# Patient Record
Sex: Female | Born: 1935 | Race: White | Hispanic: No | Marital: Married | Smoking: Never smoker
Health system: Southern US, Community
[De-identification: ages and names within clinical notes are randomized; demographics above are authoritative.]

## PROBLEM LIST (undated history)

## (undated) DIAGNOSIS — G629 Polyneuropathy, unspecified: Secondary | ICD-10-CM

## (undated) DIAGNOSIS — R42 Dizziness and giddiness: Secondary | ICD-10-CM

## (undated) DIAGNOSIS — R238 Other skin changes: Secondary | ICD-10-CM

## (undated) DIAGNOSIS — R0609 Other forms of dyspnea: Secondary | ICD-10-CM

## (undated) DIAGNOSIS — M549 Dorsalgia, unspecified: Secondary | ICD-10-CM

## (undated) DIAGNOSIS — I259 Chronic ischemic heart disease, unspecified: Secondary | ICD-10-CM

## (undated) DIAGNOSIS — I341 Nonrheumatic mitral (valve) prolapse: Secondary | ICD-10-CM

## (undated) DIAGNOSIS — R6889 Other general symptoms and signs: Secondary | ICD-10-CM

## (undated) DIAGNOSIS — R0600 Dyspnea, unspecified: Secondary | ICD-10-CM

## (undated) DIAGNOSIS — N12 Tubulo-interstitial nephritis, not specified as acute or chronic: Secondary | ICD-10-CM

## (undated) DIAGNOSIS — R040 Epistaxis: Secondary | ICD-10-CM

## (undated) DIAGNOSIS — I4891 Unspecified atrial fibrillation: Secondary | ICD-10-CM

## (undated) DIAGNOSIS — I10 Essential (primary) hypertension: Secondary | ICD-10-CM

## (undated) DIAGNOSIS — E785 Hyperlipidemia, unspecified: Secondary | ICD-10-CM

## (undated) DIAGNOSIS — R233 Spontaneous ecchymoses: Secondary | ICD-10-CM

## (undated) DIAGNOSIS — Z9289 Personal history of other medical treatment: Secondary | ICD-10-CM

## (undated) DIAGNOSIS — K5792 Diverticulitis of intestine, part unspecified, without perforation or abscess without bleeding: Secondary | ICD-10-CM

## (undated) DIAGNOSIS — E039 Hypothyroidism, unspecified: Secondary | ICD-10-CM

## (undated) DIAGNOSIS — R0789 Other chest pain: Secondary | ICD-10-CM

## (undated) HISTORY — DX: Dyspnea, unspecified: R06.00

## (undated) HISTORY — DX: Hyperlipidemia, unspecified: E78.5

## (undated) HISTORY — DX: Essential (primary) hypertension: I10

## (undated) HISTORY — PX: HAND SURGERY: SHX662

## (undated) HISTORY — DX: Other forms of dyspnea: R06.09

## (undated) HISTORY — DX: Tubulo-interstitial nephritis, not specified as acute or chronic: N12

## (undated) HISTORY — DX: Other chest pain: R07.89

## (undated) HISTORY — DX: Dizziness and giddiness: R42

## (undated) HISTORY — PX: OVARIAN CYST REMOVAL: SHX89

## (undated) HISTORY — DX: Unspecified atrial fibrillation: I48.91

## (undated) HISTORY — DX: Nonrheumatic mitral (valve) prolapse: I34.1

## (undated) HISTORY — DX: Dorsalgia, unspecified: M54.9

## (undated) HISTORY — PX: PARTIAL HYSTERECTOMY: SHX80

## (undated) HISTORY — DX: Hypothyroidism, unspecified: E03.9

## (undated) HISTORY — DX: Diverticulitis of intestine, part unspecified, without perforation or abscess without bleeding: K57.92

## (undated) HISTORY — DX: Other skin changes: R23.8

## (undated) HISTORY — DX: Polyneuropathy, unspecified: G62.9

## (undated) HISTORY — DX: Personal history of other medical treatment: Z92.89

## (undated) HISTORY — PX: OTHER SURGICAL HISTORY: SHX169

## (undated) HISTORY — DX: Other general symptoms and signs: R68.89

## (undated) HISTORY — DX: Spontaneous ecchymoses: R23.3

## (undated) HISTORY — DX: Chronic ischemic heart disease, unspecified: I25.9

---
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
## 1999-02-20 ENCOUNTER — Other Ambulatory Visit: Admission: RE | Admit: 1999-02-20 | Discharge: 1999-02-20 | Payer: Self-pay | Admitting: Obstetrics and Gynecology

## 2000-05-15 ENCOUNTER — Other Ambulatory Visit: Admission: RE | Admit: 2000-05-15 | Discharge: 2000-05-15 | Payer: Self-pay | Admitting: Obstetrics and Gynecology

## 2001-02-05 ENCOUNTER — Encounter: Payer: Self-pay | Admitting: Gastroenterology

## 2001-02-05 ENCOUNTER — Ambulatory Visit (HOSPITAL_COMMUNITY): Admission: RE | Admit: 2001-02-05 | Discharge: 2001-02-05 | Payer: Self-pay | Admitting: Gastroenterology

## 2001-03-25 ENCOUNTER — Encounter (INDEPENDENT_AMBULATORY_CARE_PROVIDER_SITE_OTHER): Payer: Self-pay | Admitting: Specialist

## 2001-03-25 ENCOUNTER — Ambulatory Visit (HOSPITAL_COMMUNITY): Admission: RE | Admit: 2001-03-25 | Discharge: 2001-03-25 | Payer: Self-pay | Admitting: Gastroenterology

## 2002-11-23 ENCOUNTER — Other Ambulatory Visit: Admission: RE | Admit: 2002-11-23 | Discharge: 2002-11-23 | Payer: Self-pay | Admitting: Obstetrics and Gynecology

## 2005-04-17 ENCOUNTER — Encounter: Admission: RE | Admit: 2005-04-17 | Discharge: 2005-04-17 | Payer: Self-pay | Admitting: Gastroenterology

## 2005-05-08 ENCOUNTER — Encounter: Admission: RE | Admit: 2005-05-08 | Discharge: 2005-05-08 | Payer: Self-pay | Admitting: Gastroenterology

## 2006-01-29 ENCOUNTER — Ambulatory Visit (HOSPITAL_BASED_OUTPATIENT_CLINIC_OR_DEPARTMENT_OTHER): Admission: RE | Admit: 2006-01-29 | Discharge: 2006-01-30 | Payer: Self-pay | Admitting: Orthopedic Surgery

## 2007-01-15 ENCOUNTER — Encounter: Admission: RE | Admit: 2007-01-15 | Discharge: 2007-01-15 | Payer: Self-pay | Admitting: Gastroenterology

## 2007-03-18 ENCOUNTER — Inpatient Hospital Stay (HOSPITAL_BASED_OUTPATIENT_CLINIC_OR_DEPARTMENT_OTHER): Admission: RE | Admit: 2007-03-18 | Discharge: 2007-03-18 | Payer: Self-pay | Admitting: Cardiology

## 2007-04-02 ENCOUNTER — Ambulatory Visit: Payer: Self-pay | Admitting: Surgery

## 2007-04-02 ENCOUNTER — Ambulatory Visit: Payer: Self-pay | Admitting: Cardiothoracic Surgery

## 2007-04-04 HISTORY — PX: CORONARY ARTERY BYPASS GRAFT: SHX141

## 2007-04-12 ENCOUNTER — Ambulatory Visit: Payer: Self-pay | Admitting: Cardiothoracic Surgery

## 2007-04-12 ENCOUNTER — Inpatient Hospital Stay (HOSPITAL_COMMUNITY): Admission: RE | Admit: 2007-04-12 | Discharge: 2007-04-19 | Payer: Self-pay | Admitting: Cardiothoracic Surgery

## 2007-05-14 ENCOUNTER — Ambulatory Visit: Payer: Self-pay | Admitting: Cardiothoracic Surgery

## 2007-12-22 ENCOUNTER — Emergency Department (HOSPITAL_COMMUNITY): Admission: EM | Admit: 2007-12-22 | Discharge: 2007-12-23 | Payer: Self-pay | Admitting: Emergency Medicine

## 2008-08-03 HISTORY — PX: CARDIOVERSION: SHX1299

## 2008-08-11 ENCOUNTER — Ambulatory Visit (HOSPITAL_COMMUNITY): Admission: RE | Admit: 2008-08-11 | Discharge: 2008-08-11 | Payer: Self-pay | Admitting: Cardiology

## 2010-06-12 ENCOUNTER — Ambulatory Visit: Payer: Self-pay | Admitting: Cardiology

## 2010-07-04 ENCOUNTER — Ambulatory Visit: Payer: Self-pay | Admitting: Cardiology

## 2010-08-05 ENCOUNTER — Ambulatory Visit: Payer: Self-pay | Admitting: Cardiology

## 2010-09-04 ENCOUNTER — Ambulatory Visit: Payer: Self-pay | Admitting: Cardiology

## 2010-09-18 ENCOUNTER — Ambulatory Visit: Payer: Self-pay | Admitting: Cardiology

## 2010-09-25 ENCOUNTER — Ambulatory Visit: Payer: Self-pay | Admitting: Cardiology

## 2010-10-04 ENCOUNTER — Encounter: Payer: Self-pay | Admitting: Cardiology

## 2010-10-09 ENCOUNTER — Ambulatory Visit: Payer: Self-pay | Admitting: Cardiology

## 2010-10-23 ENCOUNTER — Ambulatory Visit: Payer: Self-pay | Admitting: Cardiology

## 2010-11-06 ENCOUNTER — Ambulatory Visit: Payer: Self-pay | Admitting: Cardiology

## 2010-12-06 ENCOUNTER — Other Ambulatory Visit (INDEPENDENT_AMBULATORY_CARE_PROVIDER_SITE_OTHER): Payer: Medicare Other

## 2010-12-06 DIAGNOSIS — Z7901 Long term (current) use of anticoagulants: Secondary | ICD-10-CM

## 2011-01-06 ENCOUNTER — Ambulatory Visit (INDEPENDENT_AMBULATORY_CARE_PROVIDER_SITE_OTHER): Payer: Medicare Other | Admitting: Nurse Practitioner

## 2011-01-06 DIAGNOSIS — Z7901 Long term (current) use of anticoagulants: Secondary | ICD-10-CM

## 2011-01-06 DIAGNOSIS — I4891 Unspecified atrial fibrillation: Secondary | ICD-10-CM

## 2011-01-20 ENCOUNTER — Encounter (INDEPENDENT_AMBULATORY_CARE_PROVIDER_SITE_OTHER): Payer: Medicare Other

## 2011-01-20 DIAGNOSIS — Z7901 Long term (current) use of anticoagulants: Secondary | ICD-10-CM

## 2011-01-20 DIAGNOSIS — I4892 Unspecified atrial flutter: Secondary | ICD-10-CM

## 2011-02-03 ENCOUNTER — Ambulatory Visit (INDEPENDENT_AMBULATORY_CARE_PROVIDER_SITE_OTHER): Payer: Medicare Other | Admitting: *Deleted

## 2011-02-03 DIAGNOSIS — I4891 Unspecified atrial fibrillation: Secondary | ICD-10-CM

## 2011-02-03 DIAGNOSIS — Z7901 Long term (current) use of anticoagulants: Secondary | ICD-10-CM

## 2011-02-03 LAB — POCT INR: INR: 3

## 2011-02-18 ENCOUNTER — Ambulatory Visit (INDEPENDENT_AMBULATORY_CARE_PROVIDER_SITE_OTHER): Payer: Medicare Other | Admitting: *Deleted

## 2011-02-18 DIAGNOSIS — I4891 Unspecified atrial fibrillation: Secondary | ICD-10-CM

## 2011-02-19 ENCOUNTER — Other Ambulatory Visit: Payer: Self-pay | Admitting: *Deleted

## 2011-02-19 DIAGNOSIS — I4891 Unspecified atrial fibrillation: Secondary | ICD-10-CM

## 2011-02-19 MED ORDER — WARFARIN SODIUM 5 MG PO TABS: ORAL_TABLET | ORAL | Status: DC

## 2011-02-19 MED ORDER — METOPROLOL SUCCINATE ER 50 MG PO TB24: ORAL_TABLET | ORAL | Status: DC

## 2011-02-27 ENCOUNTER — Other Ambulatory Visit: Payer: Self-pay | Admitting: Cardiology

## 2011-02-27 DIAGNOSIS — I4891 Unspecified atrial fibrillation: Secondary | ICD-10-CM

## 2011-02-27 MED ORDER — METOPROLOL SUCCINATE ER 50 MG PO TB24: ORAL_TABLET | ORAL | Status: DC

## 2011-02-27 NOTE — Telephone Encounter (Signed)
rx refill per patient request 

## 2011-02-27 NOTE — Telephone Encounter (Signed)
Wants to know if she can get a Rx called in for her Metoprolol 50 mg to the [facility] in [place].  She said that she mailed her original Rx to the [place].

## 2011-03-18 ENCOUNTER — Ambulatory Visit (INDEPENDENT_AMBULATORY_CARE_PROVIDER_SITE_OTHER): Payer: Medicare Other | Admitting: *Deleted

## 2011-03-18 DIAGNOSIS — I4891 Unspecified atrial fibrillation: Secondary | ICD-10-CM

## 2011-03-18 NOTE — Op Note (Signed)
NAMEAUSTRALIA, DROLL NO.:  [id number]   MEDICAL RECORD NO.:  [id number]          PATIENT TYPE:  OIB   LOCATION:  2899                         FACILITY:  [facility]   PHYSICIAN:  Cassell Clement, M.D. DATE OF BIRTH:  10/13/1934   DATE OF PROCEDURE:  08/11/2008  DATE OF DISCHARGE:  08/11/2008                               OPERATIVE REPORT   OPERATION:  Electrical cardioversion.   HISTORY:  This is an elderly female with atrial flutter, which has  failed to convert on medical therapy.  She has been adequately  anticoagulated with Coumadin for more than a month.   She came to [facility] where after establishment of IV access, she  was given IV anesthesia by Dr. Kipp Brood.  She was then given a  synchronized shock of 75 J using diphasic defibrillator and converted  promptly to normal sinus rhythm.  She was slow to come out from under  the anesthesia, but otherwise there were no postoperative complications,  and there were no lateralizing neurologic findings.  The patient  tolerated the procedure well.           ______________________________  Cassell Clement, M.D.     TB/MEDQ  D:  08/11/2008  T:  08/12/2008  Job:  [id number]

## 2011-03-18 NOTE — Discharge Summary (Signed)
Natasha Livingston, Natasha Livingston NO.:  [id number]   MEDICAL RECORD NO.:  [id number]          PATIENT TYPE:  INP   LOCATION:  2018                         FACILITY:  [facility]   PHYSICIAN:  Kerin Perna, M.D.  DATE OF BIRTH:  1934-06-16   DATE OF ADMISSION:  04/12/2007  DATE OF DISCHARGE:  04/16/2007                               DISCHARGE SUMMARY   ADMITTING DIAGNOSIS:  Severe two-vessel coronary artery disease.   DISCHARGE DIAGNOSES:  1. Severe two-vessel coronary artery disease.  2. Sigmoid colon diverticular disease.  3. Hypertension.  4. Hyperlipidemia.  5. Peripheral neuropathy.  6. Interstitial cystitis.  7. History of mitral valve prolapse.  8. Postoperative blood loss anemia.  9. Postoperative atrial fibrillation.   PROCEDURES:  1. Coronary artery bypass grafting x3 (left internal mammary artery to      the left anterior descending, saphenous vein graft to the first      obtuse marginal, saphenous vein graft to the first diagonal).  2. Endoscopic vein harvest, left leg.   HISTORY OF PRESENT ILLNESS:  The patient is a 77 year old female who  recently underwent a stress test in preparation for sigmoid colectomy  for diverticular disease.  She had complained of some chest tightness  and decreased exercise tolerance and her Cardiolite study showed  significant ST-segment changes with anterolateral reversible defect  consistent with ischemia.  She was referred to Dr. Peter Swaziland and  underwent cardiac catheterization which showed severe LAD disease with  stenosis of the diagonal, a small ramus intermediate branch and a  proximal 50% stenosis of the circumflex.  Ejection fraction was 50% with  anterior hypokinesia and LVEDP was noted at 13.  There was no evidence  of mitral regurgitation or aortic stenosis.  Because of her significant  LAD disease, she was referred to Dr. Kathlee Nations Trigt for consideration  of surgical revascularization.  Dr. Donata Clay reviewed  her films and  agreed that her best course of action would be to proceed with CABG at  this time.  He explained the risks, benefits and alternatives of  procedure to the patient and she agreed to proceed with surgery.   HOSPITAL COURSE:  She was admitted to [facility] on April 12, 2007, and was taken to the operating room where she underwent CABG x3 as  described in detail above.  She tolerated the procedure well and was  transferred to the SICU in stable condition.  She was able to be  extubated shortly after surgery.  She was hemodynamically stable and  doing well on postop day #1.  She did have a bout of atrial fibrillation  and was started on IV amiodarone and converted to normal sinus rhythm.  She subsequently had been converted to p.o. dose and continues to  maintain sinus.  She remained in the unit for further observation and by  postop day #2, was ready for transfer to the floor.  Postoperatively,  she has done well.  She has had a mild acute blood loss anemia which has  remained stable and has not required  transfusion.  She has also been  volume overloaded and has been started on Lasix and is diuresing well.  She has had stable leukocytosis at 15,000 with no evidence of infection  on physical exam.  A urinalysis is pending at this time.  She has been  treated with aggressive pulmonary toilet measures and presently is being  weaned from supplemental oxygen with O2 saturations of 93% or greater on  room air.  She is ambulating well with cardiac rehab and is making good  progress.  She has had some mild hyperglycemia perioperatively and has  been treated with low-dose Lantus from which she is currently being  weaned.  Her sugars have been relatively stable and a preoperative  hemoglobin A1c was 6.9.  Her labs on postop day #4, showed hemoglobin  8.4, hematocrit 24.3, platelets 189, white count 15.5.  Sodium was 137,  potassium 4.2, BUN 27, creatinine 0.99.  She is tolerating  a regular  diet and is having normal bowel and bladder function.  It is felt that  if she continues to remain stable, she will hopefully be ready for  discharge home within the next 48 hours or so pending morning round  evaluation.   DISCHARGE MEDICATIONS:  1. Enteric-coated aspirin 81 mg daily.  2. Toprol XL 25 mg daily.  3. Altace 5 mg daily.  4. Lipitor 40 mg nightly.  5. Lasix 40 mg daily x5 days.  6. K-Dur 20 mEq daily x 5 days.  7. Amiodarone 400 mg b.i.d. x 1 week, then 200 mg b.i.d.  8. Gabapentin 800 mg four times a day.  9. Ultram 50-100 mg q.4-6 h. p.r.n. for pain.  10.She is to continue calcium, flax seed oil and Allegra as taken at      home.   ACTIVITY:  She is asked to refrain from driving, heavy lifting or  strenuous activity.  She may continue ambulating daily and using her  incentive spirometer.   WOUND CARE:  She may shower daily and clean her incisions with soap and  water.   DIET:  She will continue a low-fat, low-sodium diet.   FOLLOWUP:  She will need to make an appointment to see Dr. Swaziland in 2  weeks for followup.  She will have a chest x-ray at that visit.  She  will then follow up with Dr. Donata Clay on July 11, at 12:15 p.m.  If she  experiences any problems or has questions in the interim, she is asked  to contact our office immediately.      Coral Ceo, P.A.      Kerin Perna, M.D.  Electronically Signed    GC/MEDQ  D:  04/16/2007  T:  04/16/2007  Job:  [id number]   cc:   Peter M. Swaziland, M.D.  [facility]  Llana Aliment. Malon Kindle., M.D.

## 2011-03-18 NOTE — H&P (Signed)
NAME:  Natasha Livingston, Natasha Livingston NO.:  0   MEDICAL RECORD NO.:  [id number]           PATIENT TYPE:   LOCATION:                                 FACILITY:   PHYSICIAN:  Peter M. Swaziland, M.D.       DATE OF BIRTH:   DATE OF ADMISSION:  03/18/2007  DATE OF DISCHARGE:                              HISTORY & PHYSICAL   HISTORY OF PRESENT ILLNESS:  Ms. Frazee is a very pleasant, 77 year old,  white female who is being evaluated for surgery for sigmoid colectomy.  The patient has recurrent diverticulitis.  She also has a history of  hypertension and hypercholesterolemia.  She really has minimal cardiac  symptoms.  She states sometimes it is hard for her to breathe when she  is lying flat.  She does state she feels a little more tired now than  she use to, but she denies any chest pain, syncope or tachycardiac  palpitations.  She does carry a history of mitral valve prolapse for  over 25 years.  To evaluate her preoperative risk, she underwent a  stress Cardiolite study on Mar 10, 2007.  The patient exercised well  without significant chest pain.  She did have moderate dyspnea and she  had inferolateral ST-segment depression noted on ECG.  Her subsequent  Cardiolite images demonstrated a large, anterolateral, reversible defect  consistent with ischemia.  She had a normal ejection fraction of 56%.  Given these findings, it is recommended she be evaluated with cardiac  catheterization at this time.   PAST MEDICAL HISTORY:  1. Recurrent diverticulitis.  2. Mitral valve prolapse.  3. Hypertension.  4. Hypercholesterolemia.  5. History of peripheral neuropathy followed by Dr. Avie Echevaria.  6. History of interstitial cystitis followed by Dr. Vonita Moss.  7. Prior hysterectomy.  8. Previous cyst removed from her ovary.   ALLERGIES:  NO KNOWN DRUG ALLERGIES.   CURRENT MEDICATIONS:  1. Tenormin 25 mg b.i.d.  2. Calcium 600 mg b.i.d.  3. Neurontin 800 mg four times a day.  4.  Aspirin 81 mg per day.  5. Altace 10 mg per day.  6. Naprosyn p.r.n.  7. Pepcid p.r.n.  8. Lipitor 40 mg per day.   SOCIAL HISTORY:  She is married.  She has three sons.  She is a  nonsmoker.   FAMILY HISTORY:  Father died at age 27 of cerebral hemorrhage.  She had  a sister who had bypass surgery following hip replacement surgery.   REVIEW OF SYSTEMS:  She reports over 20 episodes of diverticulitis over  the years.  Her last flare was in February of this year.  She has no  edema, orthopnea or PND.  She has had no history of TIA or stroke.  Her  other review of systems are negative.   PHYSICAL EXAMINATION:  GENERAL:  She is very pleasant, white female in  no apparent distress.  VITAL SIGNS:  Weight is 144, blood pressure is 160/90, pulse 80 and  regular, respirations were normal.  HEENT:  Normocephalic, atraumatic.  Pupils equal, round, reactive to  light and accommodation.  Extraocular movements were full.  Oropharynx  is clear.  NECK:  Without JVD, adenopathy, thyromegaly or bruits.  LUNGS:  Lungs were clear to auscultation percussion.  CARDIAC:  Reveals a regular rate and rhythm without gallop, murmur, rub  or click.  ABDOMEN:  Soft and nontender.  She has no hepatosplenomegaly.  EXTREMITIES:  Her femoral and pedal pulses are 2+ and symmetric.  She  has no lower extremity edema.  NEUROLOGIC:  She has no focal neurologic findings.  She does have a mild  tremor in her hands.   LABORATORY DATA:  Her resting ECG shows normal sinus rhythm with ST and  T-wave changes consistent with inferior lateral ischemia.  Her chest x-  ray shows no active disease.   IMPRESSION:  1. Markedly abnormal stress Cardiolite study consistent with      anterolateral ischemia.  2. Hypertension.  3. Hypercholesterolemia.  4. History of recurrent diverticulitis.  5. Interstitial nephritis.  6. Peripheral neuropathy.   PLAN:  Proceed with diagnostic cardiac catheterization with further  therapy  pending these results.           ______________________________  Peter M. Swaziland, M.D.     PMJ/MEDQ  D:  03/12/2007  T:  03/13/2007  Job:  [id number]   cc:   Cassell Clement, M.D.  Adolph Pollack, M.D.  James L. Malon Kindle., M.D.

## 2011-03-18 NOTE — Assessment & Plan Note (Signed)
OFFICE VISIT   ENSLIE, SAHOTA  DOB:  1934-05-19                                        May 14, 2007  CHART #:  [id number]   CURRENT PROBLEMS:  1. Status post CABG x3 04/12/2007 for severe 2 vessel coronary artery      disease with a positive stress test.  2. Diverticular disease of the colon being prepared for colectomy.  3. Hypertension.  4. Peripheral neuropathy and tremor.   PRESENTING PROBLEM:  Ms. Engelbrecht is a 77 year old white female who was  being prepared for colectomy for diverticular disease when a stress test  was positive for ischemia. Cardiac catheterization by Dr. Swaziland  demonstrated a high grade stenosis of the LAD-diagonal and high grade  stenosis of the OM1. Her LV function was fairly well preserved. She  underwent left IMA grafting to the LAD and vein grafts to the diagonal  and OM1. The vein was harvested endoscopically from the left leg. She  had transient atrial fibrillation postoperatively which converted to  sinus rhythm on Amiodarone. She was discharged home on the fourth  postoperative day in good condition on aspirin 81 mg, Toprol XL 25 mg,  Altace 5 mg, Lipitor 40 mg, Amiodarone 200 b.i.d., Neurontin q.i.d., and  Ultram p.r.n. pain.   Since she has returned home, she has had no angina and the surgical  incisions are healing well. She has had no symptoms of CHF. Her main  problem has been some left neck soreness, probably from the sternotomy.  She has been taking Tylenol without much relief. She apparently has not  been taking the Ultram. She was seen earlier in the week by Dr.  Patty Sermons and a chest x-ray was performed which shows a tiny left plural  effusion which blunted the costal phrenic angle on the left side,  otherwise stable mediastinum and clear lung fields. She remains on  Amiodarone 200 mg b.i.d. in a sinus rhythm.   PHYSICAL EXAMINATION:  VITAL SIGNS:  Blood pressure 150/80, pulse 60,  respirations 18,  saturation 97%, temperature 97.3.  GENERAL:  She is alert and comfortable.  CHEST:  Breath sounds are clear and equal. The sternum is stable and  well healed.  CARDIAC:  Rhythm is regular without S3 gallop or rub.  EXTREMITIES:  The leg incisions are healing well.   A PA and lateral chest x-ray taken at Dr. Yevonne Pax office shows a  tiny left effusion, otherwise cardiomegaly, stable mediastinum, and  clear lung fields.   IMPRESSION AND PLAN:  I have encouraged Ms. Howells to take the Ultram  for her neck pain, especially at night when she is having difficulty  sleeping. I told her that she could resume driving in the next week when  she feels somewhat stronger. She states that her tremor has worsened  since surgery, but I assured her that it would probably come back to  baseline over the next several weeks and that she should resume her  Inderal p.r.n. She will attempt to complete a 20 minute walk on a daily  basis, follow her current medication profile, and follow a heart healthy  diet. I will see the patient back for any problems with her incisions,  or surgical issues. Thank you for the opportunity to participate in her  care.   Kerin Perna, M.D.  Electronically Signed   PV/MEDQ  D:  05/14/2007  T:  05/16/2007  Job:  [id number]   cc:   Cassell Clement, M.D.

## 2011-03-18 NOTE — Cardiovascular Report (Signed)
NAMECHAZ, Livingston NO.:  [id number]   MEDICAL RECORD NO.:  [id number]          PATIENT TYPE:  OIB   LOCATION:  1963                         FACILITY:  [facility]   PHYSICIAN:  Peter M. Swaziland, M.D.  DATE OF BIRTH:  1933-12-25   DATE OF PROCEDURE:  03/18/2007  DATE OF DISCHARGE:                            CARDIAC CATHETERIZATION   INDICATIONS FOR PROCEDURE:  The patient is 77 year old white female with  history of hypertension, hypercholesterolemia.  She recently underwent a  stress Cardiolite study for preoperative evaluation for diverticular  disease.  She had a markedly abnormal response demonstrating  anterolateral ischemia.   PROCEDURES:  Left heart catheterization, coronary left angiography.  Access via right femoral artery using standard Seldinger technique.  Equipment used 4-French 4 cm left coronary catheter, 4-French 3-D RCA  catheter, a 4-French pigtail catheter 4-French arterial sheath   MEDICATIONS:  Local anesthesia 1% Xylocaine, Versed 1 mg IV, contrast 80  mL of Omnipaque.   HEMODYNAMIC RESULTS:  Aortic pressures 149/67 with mean of 101.  Left  ventricle pressure is 148 with EDP of 13 mmHg.   ANGIOGRAPHIC DATA:  The left coronary artery arises and distributes  normally.  The left main coronary is normal.   The left anterior descending artery is moderately calcified.  There is a  long segment of disease in the proximal to mid LAD.  This begins with a  severe shelf-like stenosis at the takeoff of the first septal  perforator.  It appears to be 90% narrowed.  There is a true bifurcation  lesion at the takeoff of the first diagonal branch of 95%.  The first  diagonal branch has 90% stenosis as well that is segmental.   There is an intermediate branch which has a 99% stenosis proximally.  It  has TIMI II flow.   The left circumflex coronary artery gives rise to 2 subsequent obtuse  marginal branches.  There is 30-40% narrowing in the proximal  circumflex, otherwise no significant disease.   The right coronary artery arises and distributes normally.  It is a  normal vessel.   LEFT VENTRICULAR ANGIOGRAPHY:  Left ventricular angiography was  performed in RAO view.  This demonstrates normal left ventricular size  with severe anterior hypokinesia.  Ejection fraction estimated at 50%.  There is no mitral regurgitation or prolapse.   FINAL INTERPRETATION:  1. Severe complex two-vessel obstructive coronary artery disease.  2. Mild left ventricular dysfunction with anterior wall motion      abnormality.   PLAN:  The patient's disease appears poorly suited to percutaneous  intervention.  The LAD stenosis is long, calcified and very complex  involving the bifurcation both of the first septal perforator, first  diagonal branch which is also significantly diseased.  The intermediate  branch also appears poorly suited due to its small caliber.  Given the  complexity of her disease, I would recommend revascularization coronary  bypass surgery.           ______________________________  Peter M. Swaziland, M.D.     PMJ/MEDQ  D:  03/18/2007  T:  03/18/2007  Job:  [id number]   cc:   Cassell Clement, M.D.  Adolph Pollack, M.D.  James L. Malon Kindle., M.D.

## 2011-03-18 NOTE — Op Note (Signed)
NAMEELZA, VARRICCHIO NO.:  [id number]   MEDICAL RECORD NO.:  [id number]          PATIENT TYPE:  INP   LOCATION:  2302                         FACILITY:  [facility]   PHYSICIAN:  Kerin Perna, M.D.  DATE OF BIRTH:  15-Aug-1934   DATE OF PROCEDURE:  04/12/2007  DATE OF DISCHARGE:                               OPERATIVE REPORT   OPERATION:  1. Coronary artery bypass grafting x3 (left internal mammary artery to      left anterior descending, saphenous vein graft to diagonal,      saphenous vein graft to first obtuse marginal).  2. Endoscopic harvest of the left leg saphenous vein.   SURGEON:  Kerin Perna, M.D.   ASSISTANT:  Joni Reining, S.A.   ANESTHESIA:  General.   INDICATIONS:  The patient is a 77 year old female who had a positive  stress test in preparation for colon resection; this was positive for  ischemia and subsequent cardiac cath by Dr. Swaziland demonstrated a high-  grade stenosis of the LAD diagonal and a high-grade stenosis of the OM-  1.  LV function was fairly well-preserved and she was felt to be a  surgical candidate for surgical coronary revascularization prior to  undergoing laparotomy and colon resection.  I examined the patient in  the office and reviewed the results of the cardiac cath with the patient  and family and discussed the indications and benefits and risks of  surgery with the patient.  She understood the alternatives to surgery as  well and agreed to proceed with the operation as planned today under  what I felt was an informed consent.   OPERATIVE FINDINGS:  The proximal LAD and OM-1 vessels were heavily  calcified.  The myocardium appeared to be well-preserved.  The patient  had mild preoperative anemia and required 1 unit of packed cells during  surgery.  The vein was exposed in the right leg, but was too small, and  was harvested from the left leg.   PROCEDURE:  The patient was brought to the operating room and  placed  supine on the operating room table, where general anesthesia was  induced.  The chest, abdomen and legs were prepped with Betadine and  draped as a sterile field.  A sternal incision was made as the saphenous  vein was harvested endoscopically from the left leg.  The internal  mammary artery was harvested as a pedicle graft from its origin at the  subclavian vessels.  Heparin was administered and the ACT was documented  as being therapeutic.  A sternal retractor was placed and the  pericardium was opened and suspended.  Pursestrings were placed in the  ascending aorta and right atrium and the patient was cannulated and  placed on bypass.  The coronaries were identified for grafting and the  mammary artery and vein grafts were prepared for the distal anastomoses.  Cardioplegia catheters were placed for both antegrade aortic and  retrograde coronary sinus cardioplegia.  The patient was cooled to 32  degrees and the aortic crossclamp was applied.  Eight hundred  milliliters of cold blood cardioplegia were delivered in split doses  between the antegrade aortic and retrograde coronary sinus catheters.  There was a good cardioplegic arrest.   The distal coronary anastomoses were then performed.  The first distal  anastomosis was the OM-1; it was intramyocardial.  It had a heavily  calcified proximal 90% stenosis and it was a 1.5-mm vessel.  A reversed  saphenous vein was sewn end-to-side with running 7-0 Prolene with good  flow through the graft.  The second distal anastomosis was to the first  diagonal branch of the LAD.  This is a smaller 1.2-mm vessel with a  proximal 80% to 90% stenosis.  A reversed saphenous vein was sewn end-to-  side with a running 7-0 Prolene.  Cardioplegia was redosed.  The third  distal anastomosis was to the distal aspect of LAD.  Here was a 1.5-mm  vessel and it had a proximal heavily calcified 90% stenosis.  The left  IMA pedicle was brought through an  opening created in the left lateral  pericardium and was brought down onto the LAD and sewn end-to-side with  a running 8-0 Prolene.  There was excellent flow through the anastomosis  after briefly releasing the pedicle bulldog on the mammary artery.  The  mammary bulldog was reapplied and the pedicle secured to the epicardium.  Cardioplegia was redosed.   While the crossclamp was still in place, 2 proximal vein anastomoses  were performed on the ascending aorta using a 4.0-mm punch with running  6-0 Prolene.  Air was aspirated from the coronaries and the left side of  heart using a dose of retrograde warm blood cardioplegia (hot shot).  Crossclamp was then removed and the heart was reperfused.   The heart resumed a spontaneous rhythm.  The cardioplegia cannulas were  removed.  Pacing wires were applied.  Lungs re-expanded and the  ventilator was resumed.  The patient was then weaned from bypass without  difficulty.  Cardiac output and blood pressure were stable.  Protamine  was administered.  Although the heparin was reversed, there was still  significant coagulopathy and the patient received 1 unit of platelets,  which improved coagulation function.  The leg incisions were irrigated  and closed in a standard fashion.  The superior pericardium was closed.  The mediastinum was irrigated with warm antibiotic irrigation.  Two  mediastinal and a left pleural chest tube were placed and brought  through separate incisions.  The sternum was closed with interrupted  steel wire.  The pectoralis fascia was closed with a running #1 Vicryl.  The subcutaneous layer and skin were closed in a running Vicryl and  sterile dressings were applied.  Total bypass time was 102 minutes.  Crossclamp time was 65 minutes.      Kerin Perna, M.D.  Electronically Signed     PV/MEDQ  D:  04/12/2007  T:  04/13/2007  Job:  [id number]   cc:   [facility] Office  Peter M. Swaziland, M.D.

## 2011-03-18 NOTE — Consult Note (Signed)
NEW PATIENT CONSULTATION   Natasha Livingston, Natasha Livingston  DOB:  17-May-1934                                        Apr 02, 2007  CHART #:  [id number]   PRIMARY CARE PHYSICIAN:  Llana Aliment. Randa Evens, M.D.   REASON FOR CONSULTATION:  Severe two-vessel coronary artery disease with  positive stress test prior to elective sigmoid colectomy for  diverticular disease.   CHIEF COMPLAINT:  Chest tightness and decreased exercise tolerance.   HISTORY OF PRESENT ILLNESS:  I was asked to evaluate this 77 year old  white female who was evaluated for a sigmoid colectomy for diverticular  disease.  A cardiac evaluation by Dr. Swaziland included a Cardiolite  study, which showed significant ST segment changes with an anterolateral  reversible defect consistent with ischemia.  The EF was 56%.  The  patient then underwent cardiac cath by Dr. Swaziland on May 15.  This  demonstrated severe disease of the LAD diagonal, a stenosis of a small  ramus intermediate branch, and proximal 50% stenosis of the circumflex.  Her ejection fraction was 50% with anterior hypokinesia, and her LVEDP  was measured at 13.  There is no evidence of mitral regurgitation or  aortic stenosis.  She was felt to be a candidate for surgical  revascularization prior to undergoing elective colon resection.  A 2D  echo had been performed in Dr. Elvis Coil office which showed mild aortic  sclerosis, mild mitral valve prolapse without regurgitation, and mild  diastolic dysfunction.   PAST MEDICAL HISTORY:  1. Hypertension.  2. Hyperlipidemia.  3. Peripheral neuropathy, followed by Dr. Sandria Manly.  4. Interstitial cystitis, followed by Dr. Vonita Moss.  5. Mitral valve prolapse.  6. Recurrent diverticulitis with her most recent flareup being      February of this year.   CURRENT MEDICATIONS:  1. Tenormin 25 mg b.i.d.  2. Neurontin 800 mg q.i.d.  3. Aspirin 81 mg daily.  4. Altace 10 mg daily.  5. Pepcid 20 mg p.r.n.  6. Lipitor 40 mg  daily.  7. Calcium and Naprosyn p.r.n.   SOCIAL HISTORY:  Patient is married and has three sons.  Is a nonsmoker.   FAMILY HISTORY:  Negative for coronary artery disease or cardiac  surgery.  Negative for diabetes.   REVIEW OF SYSTEMS:  Surgical review is significant for cholecystectomy,  left thumb surgery, and a featured sigmoid colectomy planned.  Constitutional review is negative for fever or weight loss.  ENT review  is negative for difficulty swallowing or active dental problems.  Thoracic review is negative for chest trauma or history of abnormal  chest x-ray.  Cardiac review is positive for documented two vessel  coronary artery disease with EF of 50%.  GI review is positive for  diverticular disease with recurrent flare-ups requiring feature  colectomy.  She denies blood per rectum, jaundice, or hepatitis.  Urologic review is positive for cystitis.  Vascular review is negative  for DVT, claudication, or TIA.  Neurologic review is positive for  neuropathy, negative for stroke or seizure.  Hematologic review is  negative for bleeding disorder or prior blood transfusions.   PHYSICAL EXAMINATION:  VITAL SIGNS:  Patient is 5 feet 7.  Weighs 143  pounds.  Blood pressure 135/70, pulse 55, sinus.  Respirations are 18.  Saturation is 98%.  She is a pleasant, comfortable white  female in no  distress.  Accompanied by husband and son today.  HEENT:  Normocephalic.  Pharynx is clear.  NECK:  Supple without JVD, mass, or carotid bruit.  LYMPHATICS:  No palpable supraclavicular or cervical adenopathy.  CHEST:  Without deformity.  Breath sounds are clear and equal.  CARDIAC:  Regular rhythm without S3 gallop or murmur.  ABDOMEN:  Soft and nontender without organomegaly or pulsatile mass.  EXTREMITIES:  No clubbing, cyanosis or edema.  VASCULAR:  There are 2+ pulses in the lower extremities and radial  pulses bilaterally.  There is no venous insufficiency noted.  NEUROLOGIC:  Alert and  oriented without focal motor deficit.  She does  have a tremor in the right hand for which she takes propranolol 10 mg  t.i.d. p.r.n.   ALLERGIES:  No known drug allergies.   LABORATORY DATA:  I reviewed the coronary cath performed two weeks ago  by Dr. Swaziland, and she has severe LAD diagonal disease over a long  segment, which would be difficult to treat percutaneously.  The ramus  intermedius is probably too small to graft, but there is a proximal mild-  to-moderate circumflex stenosis.   IMPRESSION/PLAN:  The patient would appear to benefit from surgical  coronary revascularization.  The patient wishes to wait for surgery  until after the graduation of her grandchild next week. We will  tentatively schedule her surgery for Monday, June 9th, with bypass  grafts planned to the LAD diagonal, circumflex, and possibly the ramus  intermedius.  I discussed the details with surgery, with the patient and  her family, including the alternatives to surgery, the expected  postoperative recover, and the potential risks.  She understands and  agrees to proceed.  She will take her beta blocker and ACE inhibitor the  morning of surgery.  The carotid Dopplers performed last week showed no  significant carotid disease.  Her palmar arch studies are both normal,  and her brachial artery pressures are equal bilaterally.  She has  palpable pedal pulses bilaterally.   Kerin Perna, M.D.  Electronically Signed   PV/MEDQ  D:  04/02/2007  T:  04/03/2007  Job:  [id number]   cc:   Peter M. Swaziland, M.D.  [facility], [place], [place]

## 2011-03-21 NOTE — Op Note (Signed)
Natasha Livingston, Natasha Livingston                  ACCOUNT NO.:  [id number]   MEDICAL RECORD NO.:  [id number]          PATIENT TYPE:  AMB   LOCATION:  [facility]SC                          FACILITY:  [facility]   PHYSICIAN:  Natasha Livingston, M.D. DATE OF BIRTH:  [birth year]   DATE OF PROCEDURE:  01/29/2006  DATE OF DISCHARGE:                                 OPERATIVE REPORT   PREOPERATIVE DIAGNOSIS:  Painful left thumb carpometacarpal degenerative  arthritis with profound osteophyte formation and bone on bone arthropathy.   POSTOPERATIVE DIAGNOSIS:  Painful left thumb carpometacarpal degenerative  arthritis with profound osteophyte formation and bone on bone arthropathy.   OPERATION:  1.  Resection of left trapezium with synovectomy and removed of loose bodies      from left thumb carpometacarpal joint.  2.  Reconstruction of an index thumb intermetacarpal ligament utilizing a      free palmaris longus tendon graft.   SURGEON:  Natasha Livingston, M.D.   ASSISTANT:  Natasha Livingston, P.A.-C.   ANESTHESIA:  General by LMA.   SUPERVISING ANESTHESIOLOGIST:  Natasha Livingston, M.D.   INDICATIONS:  Natasha Livingston is a 77 year old retired woman referred by Dr.  Ronny Livingston for evaluation and management of a painful left thumb CMC  arthrosis.  For the past two years we had been discussing treatment options  including splinting, activity modification, anti-inflammatory medication,  and steroid injection.  Due to failure to all of the above mentioned  measures, Natasha Livingston requested surgical reconstruction of her left thumb at  this time.  Preoperatively we advised her of potential risks and benefits of  surgery including the relatively high incidence of regional pain syndrome  that is seen following thumb reconstruction.  In addition, she understands  that we could have technical difficulties with the tendon graft rupturing at  a later date and having subsidence of the thumb metacarpal.  Given the sum  total  of her predicament and the fact that she has generalized arthritis,  she understands she may have some residual hand stiffness following this  reconstruction.  After a lengthy informed consent, she is brought to the  operating room at this time anticipating left trapezium excision followed by  suspension plasty an intermetacarpal ligament reconstruction.   PROCEDURE:  Natasha Livingston is brought to the operating room and placed in  supine position on the table.  Following an anesthesia consultation by Dr.  Gelene Livingston, general anesthesia by LMA was selected.  Under Natasha Livingston  direct supervision, general anesthesia by LMA technique was induced followed  by routine Betadine scrub and paint of the left upper extremity. Impervious  arthroscopy drapes were applied followed by exsanguination of the left arm  with an Esmarch bandage and inflation arterial tourniquet on the proximal  brachium to 240 mmHg due to systolic hypertension.  1 gram of Ancef was  administered in the holding area as an IV prophylactic antibiotic.   The procedure commenced with a Wagner curvilinear incision paralleling the  dorsal margin of the thenar muscles.  The subcutaneous tissue were carefully  divided taking  care to identify the abductor pollicis longus tendon slips.  The interval between the two abductor pollicis longus tendon slips was  elevated and the thenar muscles elevated off the capsule of the CMC joint.  The capsule was incised longitudinally and carefully elevated off the  trapezium exposing the entire trapezium by subperiosteal dissection.  The  trapezium had a very large osteophyte that had formed palmar and dorsal  along its radial border.  The trapezium was morselized with a 4 mm and 6 mm  osteotome and removed piecemeal with a fine rongeur.  A complete synovectomy  of the [facility] joint was accomplished followed by removal of multiple loose  bodies and irrigation.  Drill holes were created through the  base of the  index metacarpal from palmar to dorsal, distal to the articular facet for  the index metacarpal, and from dorsal to palmar from the dorsum of the  metacarpal 1 cm distal to the proximal articular surface to the central  portion of the proximal articular surface.  Care was taken to remove the  beak osteophyte from the volar lip of the thumb metacarpal.   The palmaris longus was harvested through an extension of the Wagner  incision taking care to identify and gently retract the palmar cutaneous  branch the median nerve.  A moderately sized palmaris longus was recovered  measuring 4 mm in width and approximately 15 cm in length.   A second incision was fashioned on the dorsum of the hand at the insertion  of extensor carpi radialis brevis.  The palmaris longus graft was braided  through the carpi radialis brevis insertion at the base of the index  metacarpal ulnar aspect and brought through the index metacarpal from dorsal  to palmar and up to the base of thumb metacarpal recreating a  intermetacarpal ligament.  Prior to tensioning this, the thumb was placed in  the position of palmar radial mid abduction and distraction.  Two 0.062  inches Kirschner wires were placed through the thumb metacarpal into the  index metacarpal, maintaining a posture of elevation and abduction with  traction on the thenar muscles.  Some technical difficulties were  encountered with our initial wire driver.  This was replaced with a mini  driver with satisfactory placement of the Kirschner wires.  The wires were  trimmed in the usual manner followed by placement of pin covers.   The wounds were thoroughly irrigated followed by tensioning of the  intermetacarpal ligament by Pulvertaft weave into the palmar slip of the  abductor pollicis longus that inserted on the thenar muscles.  The tails  were placed within the cavity created by trapezium excision.  The thenar muscles then repaired anatomically  to the periosteum of thumb metacarpal and  the insertion of the abductor pollicis longus with mattress sutures of 3-0  Ethibond.  The wounds were then repaired with subdermal sutures of 4-0  Vicryl and intradermal 3-0 Prolene.  AP lateral C-arm images were obtained  documenting very satisfactory suspension of the thumb metacarpal.  The  wounds were then infiltrated with 0.25% Marcaine followed by dressing with  Xeroflow sterile gauze and a voluminous thumb spica splint.  There no  apparent complications.  Natasha Livingston tolerated the surgery and anesthesia  well.  She was transferred to the recovery room with stable signs.   She will be admitted to [facility] for observation of her vital  signs and analgesics in the form of IV and p.o. Dilaudid and IV PCA  morphine.  She will also be provided Ancef 1 gram IV q.8 h x3 doses as a  prophylactic antibiotic.      Natasha Fitch Livingston, M.D.  Electronically Signed     RVS/MEDQ  D:  01/29/2006  T:  01/30/2006  Job:  [id number]   cc:   Cassell Clement, M.D.  Fax: [phone]

## 2011-03-21 NOTE — Procedures (Signed)
[place]  Patient:    Natasha Livingston, WRUBEL                    MRN: [id number] Proc. Date: 03/25/01 Adm. Date:  [id number] Attending:  Orland Mustard CC:         Clovis Pu Patty Sermons, M.D.  Maretta Bees. Vonita Moss, M.D.   Procedure Report  DATE OF BIRTH:  1934-01-25  PROCEDURE PERFORMED:  Colonoscopy and polypectomy.  ENDOSCOPIST:  Llana Aliment. Randa Evens, M.D.  MEDICATIONS USED:  Fentanyl 40 mcg, Versed 4 mg IV.  INSTRUMENT:  Pediatric Olympus video colonoscope.  INDICATIONS:  Follow-up for diverticulitis.  The patient has had known diverticular disease and has had Cipro and Flagyl with improvement.  This procedure is done to make certain nothing else is going on.  DESCRIPTION OF PROCEDURE:  The procedure had been explained to the patient and consent obtained.  With the patient in the left lateral decubitus position, the Olympus pediatric video colonoscope was inserted and advanced under direct visualization.  The prep was quite good and we were able to advance through the diverticular disease in the sigmoid colon.  There was moderate to marked diverticular disease but no active diverticulitis.  The scope was passed easily to the cecum.  The ileocecal valve and appendiceal orifice were identified.  The scope was withdrawn.  The cecum, ascending colon, hepatic flexure, transverse colon, splenic flexure, descending and sigmoid colon were seen well.  Extensive diverticular disease in the sigmoid colon but no active diverticulitis.  25 cm from the anal verge a 0.5 cm polyp was encountered, removed with a snare and sucked through the scope.  No other lesions were seen.  The scope withdrawn, patient tolerated the procedure well.  Maintained on low flow oxygen and pulse oximeter throughout the procedure with no obvious problem.  ASSESSMENT: 1. Sigmoid colon polyp removed. 2. Diverticulosis, no active diverticulitis.  I suspect she is recovering  from    diverticulitis.  PLAN:   Will give a sheet of instructions about diverticular disease.  Will start her on Sorbitol and will see her back in the office in two to three months.  She may well need another colonoscopy.  Routine postpolypectomy instructions. DD:  03/25/01 TD:  03/25/01 Job: [id number]

## 2011-03-25 ENCOUNTER — Ambulatory Visit (HOSPITAL_COMMUNITY): Payer: Medicare Other | Attending: Cardiovascular Disease | Admitting: Radiology

## 2011-03-25 ENCOUNTER — Ambulatory Visit (INDEPENDENT_AMBULATORY_CARE_PROVIDER_SITE_OTHER): Payer: Medicare Other | Admitting: Cardiovascular Disease

## 2011-03-25 ENCOUNTER — Encounter: Payer: Self-pay | Admitting: Cardiovascular Disease

## 2011-03-25 ENCOUNTER — Ambulatory Visit (INDEPENDENT_AMBULATORY_CARE_PROVIDER_SITE_OTHER): Payer: Self-pay | Admitting: *Deleted

## 2011-03-25 ENCOUNTER — Other Ambulatory Visit (HOSPITAL_COMMUNITY): Payer: Self-pay | Admitting: Cardiovascular Disease

## 2011-03-25 ENCOUNTER — Telehealth: Payer: Self-pay | Admitting: Cardiovascular Disease

## 2011-03-25 DIAGNOSIS — N12 Tubulo-interstitial nephritis, not specified as acute or chronic: Secondary | ICD-10-CM | POA: Insufficient documentation

## 2011-03-25 DIAGNOSIS — R6889 Other general symptoms and signs: Secondary | ICD-10-CM | POA: Insufficient documentation

## 2011-03-25 DIAGNOSIS — I079 Rheumatic tricuspid valve disease, unspecified: Secondary | ICD-10-CM | POA: Insufficient documentation

## 2011-03-25 DIAGNOSIS — I4891 Unspecified atrial fibrillation: Secondary | ICD-10-CM

## 2011-03-25 DIAGNOSIS — I341 Nonrheumatic mitral (valve) prolapse: Secondary | ICD-10-CM | POA: Insufficient documentation

## 2011-03-25 DIAGNOSIS — R233 Spontaneous ecchymoses: Secondary | ICD-10-CM | POA: Insufficient documentation

## 2011-03-25 DIAGNOSIS — G629 Polyneuropathy, unspecified: Secondary | ICD-10-CM | POA: Insufficient documentation

## 2011-03-25 DIAGNOSIS — M549 Dorsalgia, unspecified: Secondary | ICD-10-CM | POA: Insufficient documentation

## 2011-03-25 DIAGNOSIS — R238 Other skin changes: Secondary | ICD-10-CM | POA: Insufficient documentation

## 2011-03-25 DIAGNOSIS — I059 Rheumatic mitral valve disease, unspecified: Secondary | ICD-10-CM | POA: Insufficient documentation

## 2011-03-25 DIAGNOSIS — E785 Hyperlipidemia, unspecified: Secondary | ICD-10-CM | POA: Insufficient documentation

## 2011-03-25 DIAGNOSIS — R0989 Other specified symptoms and signs involving the circulatory and respiratory systems: Secondary | ICD-10-CM | POA: Insufficient documentation

## 2011-03-25 DIAGNOSIS — I10 Essential (primary) hypertension: Secondary | ICD-10-CM | POA: Insufficient documentation

## 2011-03-25 DIAGNOSIS — R0789 Other chest pain: Secondary | ICD-10-CM | POA: Insufficient documentation

## 2011-03-25 DIAGNOSIS — E039 Hypothyroidism, unspecified: Secondary | ICD-10-CM | POA: Insufficient documentation

## 2011-03-25 DIAGNOSIS — I259 Chronic ischemic heart disease, unspecified: Secondary | ICD-10-CM | POA: Insufficient documentation

## 2011-03-25 DIAGNOSIS — K5792 Diverticulitis of intestine, part unspecified, without perforation or abscess without bleeding: Secondary | ICD-10-CM | POA: Insufficient documentation

## 2011-03-25 DIAGNOSIS — R0600 Dyspnea, unspecified: Secondary | ICD-10-CM | POA: Insufficient documentation

## 2011-03-25 DIAGNOSIS — I350 Nonrheumatic aortic (valve) stenosis: Secondary | ICD-10-CM | POA: Insufficient documentation

## 2011-03-25 DIAGNOSIS — I379 Nonrheumatic pulmonary valve disorder, unspecified: Secondary | ICD-10-CM | POA: Insufficient documentation

## 2011-03-25 DIAGNOSIS — J189 Pneumonia, unspecified organism: Secondary | ICD-10-CM | POA: Insufficient documentation

## 2011-03-25 DIAGNOSIS — R0609 Other forms of dyspnea: Secondary | ICD-10-CM | POA: Insufficient documentation

## 2011-03-25 DIAGNOSIS — R42 Dizziness and giddiness: Secondary | ICD-10-CM | POA: Insufficient documentation

## 2011-03-25 DIAGNOSIS — R079 Chest pain, unspecified: Secondary | ICD-10-CM

## 2011-03-25 LAB — BASIC METABOLIC PANEL
BUN: 22 mg/dL (ref 6–23)
CO2: 26 mEq/L (ref 19–32)
Chloride: 108 mEq/L (ref 96–112)
Creatinine, Ser: 1 mg/dL (ref 0.4–1.2)
Potassium: 4.1 mEq/L (ref 3.5–5.1)

## 2011-03-25 LAB — TSH: TSH: 1.82 u[IU]/mL (ref 0.35–5.50)

## 2011-03-25 MED ORDER — METOPROLOL SUCCINATE ER 100 MG PO TB24: 100.0000 mg | ORAL_TABLET | Freq: Every day | ORAL | Status: DC

## 2011-03-25 NOTE — Telephone Encounter (Signed)
Spoke with son re tests, mother sitting near to hear results, Pt verbalized understanding. Alfonso Ramus RN

## 2011-03-25 NOTE — Assessment & Plan Note (Signed)
Mrs. Natasha Livingston presents with recurrent episodes of atrial fibrillation. I suspect that she has been that fibrillation for 2 weeks and that this explains her symptoms. She does not describe the pain but does describe palpitations and uneasiness in her chest.  She has been therapeutic on Coumadin. She did not want to go to the hospital for cardioversion because her husband needs to go the hospital for some different test in a week.  We will increase her Toprol to 100 mg a day. This should help control her ventricular rate.  She will return to see Dr. Patty Sermons in 2 weeks.  I think she may need to have another cardioversion.

## 2011-03-25 NOTE — Telephone Encounter (Signed)
Family called, med increased and explained, toprol increased.

## 2011-03-25 NOTE — Progress Notes (Signed)
Natasha Livingston Date of Birth  [birth year] [facility] [address]     [address] [place], [place]  [address] [phone]  Fax  [phone]  History of Present Illness:  Natasha Livingston is an elderly female who is a patient of Natasha Livingston. She has a history of coronary artery disease and status post coronary artery bypass grafting. She has a history of atrial fibrillation in the past. She has successful cardioversion in October 2009. She has been tried on amiodarone in the past but stopped it when she developed hypothyroidism. She also has a history of hypertension, aortic stenosis, hyperlipidemia and peripheral neuropathy.  For the past 2 weeks she has been feeling somewhat unusual. She notes palpitations especially when she's lying down. She feels better if she sits up or walks around. She has not had any episodes of angina. She thinks she might be a little bit more short of breath.      Current Outpatient Prescriptions on File Prior to Visit  Medication Sig Dispense Refill  . metoprolol (TOPROL XL) 50 MG 24 hr tablet 1/2 twice daily  30 tablet  3  . warfarin (COUMADIN) 5 MG tablet 1 daily or as directed  90 tablet  3    Allergies  Allergen Reactions  . Amiodarone   . Amlodipine   . Crestor (Rosuvastatin Calcium)   . Lipitor (Atorvastatin Calcium)     Past Medical History  Diagnosis Date  . Chest discomfort   . Dizziness   . MVP (mitral valve prolapse)   . DOE (dyspnea on exertion)   . Bruises easily   . Back pain   . Forgetfulness   . Atrial fibrillation   . Hypertension   . Hypothyroidism   . Aortic stenosis   . Hyperlipidemia   . Diverticulitis   . Interstitial nephritis   . Neuropathy   . Ischemic heart disease     Past Surgical History  Procedure Date  . Coronary artery bypass graft 04/2007  . Cardioversion 08/2008  . Ovarian cyst removal     History  Smoking status  . Never Smoker   Smokeless tobacco  .  Not on file    History  Alcohol Use No    Family History  Problem Relation Age of Onset  . Cerebral aneurysm    . Cerebral aneurysm Father     Reviw of Systems:  Reviewed in the HPI.  All other systems are negative.  Physical Exam: BP 158/110  Pulse 94  Ht 5\' 7"  (1.702 m)  Wt 155 lb 3.2 oz (70.398 kg)  BMI 24.31 kg/m2 The patient is alert and oriented x 3.  The mood and affect are normal.  The skin is warm and dry.  Color is normal.  The HEENT exam reveals that the sclera are nonicteric.  The mucous membranes are moist.  The carotids are 2+ without bruits.  There is no thyromegaly.  There is no JVD.  The lungs are clear.  The chest wall is non tender.  The heart exam reveals an  irregular rate with a normal S1 and S2.  There are no murmurs, gallops, or rubs.  The PMI is not displaced.   Abdominal exam reveals good bowel sounds.  There is no guarding or rebound.  There is no hepatosplenomegaly or tenderness.  There are no masses.  Exam of the legs reveal no clubbing, cyanosis, or edema.  The legs are without rashes.  The distal  pulses are intact.  Cranial nerves II - XII are intact.  Motor and sensory functions are intact.  The gait is normal.  ECG: Atrial fibrillation with a controlled ventricular response. She has T wave inversions in the inferior and lateral leads. This examination is from her previous EKG.  Assessment / Plan:

## 2011-03-25 NOTE — Telephone Encounter (Signed)
Called while he was down at the [facility] in [place] [phone]. Was wondering when his mother's afib medication was going to be called in. I have pulled the chart.

## 2011-03-28 ENCOUNTER — Telehealth: Payer: Self-pay | Admitting: Cardiology

## 2011-03-28 NOTE — Telephone Encounter (Signed)
recvd call from patient stating that she her bp was 158-110 tues.  She saw dr. Elease Hashimoto, he suggested cardioversion.  She wants to know if dr. Patty Sermons agree's with this plan.

## 2011-03-28 NOTE — Telephone Encounter (Signed)
Adv. Patient Dr. Patty Sermons would discuss cardioversion at next visit

## 2011-04-04 ENCOUNTER — Ambulatory Visit (INDEPENDENT_AMBULATORY_CARE_PROVIDER_SITE_OTHER): Payer: Medicare Other | Admitting: Cardiology

## 2011-04-04 ENCOUNTER — Encounter: Payer: Self-pay | Admitting: Cardiology

## 2011-04-04 DIAGNOSIS — I4891 Unspecified atrial fibrillation: Secondary | ICD-10-CM

## 2011-04-04 DIAGNOSIS — I341 Nonrheumatic mitral (valve) prolapse: Secondary | ICD-10-CM

## 2011-04-04 DIAGNOSIS — R0609 Other forms of dyspnea: Secondary | ICD-10-CM

## 2011-04-04 DIAGNOSIS — I059 Rheumatic mitral valve disease, unspecified: Secondary | ICD-10-CM

## 2011-04-04 NOTE — Assessment & Plan Note (Addendum)
Her exertional dyspnea has improved since her ventricular response has been brought under better control with higher dose ofToprolWhich is 50 mg twice a day.

## 2011-04-04 NOTE — Assessment & Plan Note (Signed)
Patient had an echocardiogram on 5 2212 which showed mitral valve prolapse with mitral regurgitation and left atrial enlargement.  Left ventricular function was normal

## 2011-04-04 NOTE — Progress Notes (Signed)
Lawerance Cruel Maddy Date of Birth:  December 12, 1933 [facility] / [facility] [address]   [address] [place], [place]  [address] [phone]           Fax   [phone]  History of Present Illness: This pleasant 77 year old woman is seen for a scheduled followup office visit.  She has chronic atrial fibrillation.  She has been on long-term Coumadin he has coronary disease and underwent coronary artery bypass graft surgery in June of 2008.  He also has a history of hypertension, mild aortic stenosis, hyperlipidemia, diverticulitis, interstitial nephritis, and peripheral neuropathy.  His had known mitral valve prolapse.  Since last visit she has done better since her beta blocker was increased to Toprol 50 mg twice a day.  Current Outpatient Prescriptions  Medication Sig Dispense Refill  . ALPRAZolam (XANAX) 0.25 MG tablet Take 0.25 mg by mouth at bedtime as needed.        Marland Kitchen aspirin 81 MG tablet Take 81 mg by mouth daily.        Marland Kitchen CALCIUM PO Take by mouth as needed.        Marland Kitchen Fexofenadine-Pseudoephedrine (ALLEGRA-D 12 HOUR PO) Take by mouth as needed.        . gabapentin (NEURONTIN) 800 MG tablet Take 800 mg by mouth 3 (three) times daily.        . hydrochlorothiazide 25 MG tablet Take 25 mg by mouth daily.        . hydrocortisone (PROCTOSOL HC) 2.5 % rectal cream Place 1 application rectally as needed.        Marland Kitchen levothyroxine (SYNTHROID, LEVOTHROID) 25 MCG tablet Take 25 mcg by mouth daily.        . metoprolol (TOPROL XL) 100 MG 24 hr tablet Take 1 tablet (100 mg total) by mouth daily. 1/2 twice daily  30 tablet  3  . ramipril (ALTACE) 10 MG tablet Take 10 mg by mouth daily.        Marland Kitchen warfarin (COUMADIN) 5 MG tablet 1 daily or as directed  90 tablet  3  . DISCONTD: rosuvastatin (CRESTOR) 5 MG tablet Take 5 mg by mouth daily.          Allergies  Allergen Reactions  . Amiodarone   . Amlodipine   . Crestor (Rosuvastatin Calcium)   . Lipitor (Atorvastatin Calcium)     Patient  Active Problem List  Diagnoses  . Atrial fibrillation  . Chest discomfort  . Dizziness  . MVP (mitral valve prolapse)  . DOE (dyspnea on exertion)  . Bruises easily  . Back pain  . Forgetfulness  . Atrial fibrillation  . Hypertension  . Hypothyroidism  . Aortic stenosis  . Hyperlipidemia  . Diverticulitis  . Interstitial nephritis  . Neuropathy  . Ischemic heart disease    History  Smoking status  . Never Smoker   Smokeless tobacco  . Not on file    History  Alcohol Use No    Family History  Problem Relation Age of Onset  . Cerebral aneurysm    . Cerebral aneurysm Father     Review of Systems: Constitutional: no fever chills diaphoresis or fatigue or change in weight.  Head and neck: no hearing loss, no epistaxis, no photophobia or visual disturbance. Respiratory: No cough, shortness of breath or wheezing. Cardiovascular: No chest pain peripheral edema, palpitations. Gastrointestinal: No abdominal distention, no abdominal pain, no change in bowel habits hematochezia or melena. Genitourinary: No dysuria, no frequency, no urgency,  no nocturia. Musculoskeletal:No arthralgias, no back pain, no gait disturbance or myalgias. Neurological: No dizziness, no headaches, no numbness, no seizures, no syncope, no weakness, no tremors. Hematologic: No lymphadenopathy, no easy bruising. Psychiatric: No confusion, no hallucinations, no sleep disturbance.    Physical Exam: Filed Vitals:   04/04/11 1114  BP: 122/75  Pulse: 84  The general appearance reveals a well-developed well-nourished elderly woman in no distress.Pupils equal and reactive.   Extraocular Movements are full.  There is no scleral icterus.  The mouth and pharynx are normal.  The neck is supple.  The carotids reveal no bruits.  The jugular venous pressure is normal.  The thyroid is not enlarged.  There is no lymphadenopathy.The chest is clear to percussion and auscultation. There are no rales or rhonchi.  Expansion of the chest is symmetrical.  The heart reveals a grade 2/6 murmur of mitral regurgitation.  Rhythm is irregular.The abdomen is soft and nontender. Bowel sounds are normal. The liver and spleen are not enlarged. There Are no abdominal masses. There are no bruits.The pedal pulses are good.  There is no phlebitis or edema.  There is no cyanosis or clubbing.Strength is normal and symmetrical in all extremities.  There is no lateralizing weakness.  There are no sensory deficits.The skin is warm and dry.  There is no rash.   Assessment / Plan: Continue same medications.  Recheck in 2 months for followup office visit with EKG

## 2011-04-04 NOTE — Assessment & Plan Note (Signed)
Patient has a history of chronic atrial fibrillation.  She has been on long-term Coumadin.  She was seen several weeks ago by Dr. Elease Hashimoto because of complaints of increasing palpitations and dyspnea.  She was noted at that time to have poorly controlled ventricular rate and her beta blocker was increased to metoprolol 50 mg twice a day.  On this higher dose she has felt much better.  Her energy level has improved and she has not been experiencing any chest pain or shortness of breath.  She's not had any thromboembolic symptoms from her atrial fibrillation and she remains on Coumadin

## 2011-04-15 ENCOUNTER — Ambulatory Visit (INDEPENDENT_AMBULATORY_CARE_PROVIDER_SITE_OTHER): Payer: Medicare Other | Admitting: Cardiology

## 2011-04-15 ENCOUNTER — Ambulatory Visit (INDEPENDENT_AMBULATORY_CARE_PROVIDER_SITE_OTHER): Payer: Medicare Other | Admitting: *Deleted

## 2011-04-15 ENCOUNTER — Ambulatory Visit
Admission: RE | Admit: 2011-04-15 | Discharge: 2011-04-15 | Disposition: A | Discharge status: Home / Self Care | Payer: Medicare Other | Source: Ambulatory Visit | Attending: Cardiology | Admitting: Cardiology

## 2011-04-15 VITALS — BP 110/70 | HR 70 | Ht 67.0 in | Wt 150.0 lb

## 2011-04-15 DIAGNOSIS — IMO0001 Reserved for inherently not codable concepts without codable children: Secondary | ICD-10-CM

## 2011-04-15 DIAGNOSIS — R5383 Other fatigue: Secondary | ICD-10-CM

## 2011-04-15 DIAGNOSIS — I4891 Unspecified atrial fibrillation: Secondary | ICD-10-CM

## 2011-04-15 DIAGNOSIS — R5381 Other malaise: Secondary | ICD-10-CM

## 2011-04-15 DIAGNOSIS — R0602 Shortness of breath: Secondary | ICD-10-CM

## 2011-04-15 LAB — HEPATIC FUNCTION PANEL
ALT: 25 U/L (ref 0–35)
AST: 26 U/L (ref 0–37)
Albumin: 3.9 g/dL (ref 3.5–5.2)
Total Protein: 6.8 g/dL (ref 6.0–8.3)

## 2011-04-15 LAB — CBC WITH DIFFERENTIAL/PLATELET
Eosinophils Relative: 1.5 % (ref 0.0–5.0)
HCT: 40.9 % (ref 36.0–46.0)
Hemoglobin: 13.8 g/dL (ref 12.0–15.0)
Lymphs Abs: 1.1 10*3/uL (ref 0.7–4.0)
MCV: 93.4 fl (ref 78.0–100.0)
Monocytes Absolute: 0.6 10*3/uL (ref 0.1–1.0)
Monocytes Relative: 9.5 % (ref 3.0–12.0)
Neutro Abs: 4.8 10*3/uL (ref 1.4–7.7)
WBC: 6.6 10*3/uL (ref 4.5–10.5)

## 2011-04-15 LAB — POCT INR: INR: 2.9

## 2011-04-15 NOTE — Assessment & Plan Note (Signed)
The patient has a long history of paroxysmal atrial fibrillation.  Today she is in atrial fibrillation.  She's had no thromboembolic symptoms.  She's not having any exacerbation of exertional dyspnea.

## 2011-04-15 NOTE — Progress Notes (Signed)
Natasha Livingston Filter Date of Birth:  29-Dec-1933 [facility] / [facility] [address]   [address] [place], [place]  [address] [phone]           Fax   [phone]  History of Present Illness: This pleasant 77 year old who is seen as a work in office visit.  She comes in today because of worsening malaise and fatigue.  She's had a long history of paroxysmal atrial fibrillation.  She has known ischemic heart disease and is status post CABG.  Previous echocardiogram has shown mild left ventricle systolic dysfunction with ejection fraction of 45-50%.  She has known mild aortic stenosis.  She's not having any cough or sputum production.  She has not had a recent chest x-ray to look at heart dimension.Her last echocardiogram was 04/12/10 and showed very mild aortic stenosis and showed an ejection fraction of 45-50% with mild left ventricular systolic dysfunction and normal diastolic function and biatrial enlargement.  She was in normal sinus rhythm at time of the echo.  She also has mild aortic stenosis mild mitral regurgitation mild tricuspid regurgitation with mild pulmonary hypertension and a right ventricular systolic pressure of 39.  Her last nuclear stress test was a 12/03/07 which was done a year after her coronary artery bypass graft surgery of 2008 and at that time her ejection fraction could not be determined because of her arrhythmia and no gated images.  However there was no evidence of ischemia and it was felt to be a low risk nuclear stress test.  Current Outpatient Prescriptions  Medication Sig Dispense Refill  . ALPRAZolam (XANAX) 0.25 MG tablet Take 0.25 mg by mouth at bedtime as needed.        Marland Kitchen aspirin 81 MG tablet Take 81 mg by mouth daily.        Marland Kitchen CALCIUM PO Take by mouth as needed.        Marland Kitchen Fexofenadine-Pseudoephedrine (ALLEGRA-D 12 HOUR PO) Take by mouth as needed.        . gabapentin (NEURONTIN) 800 MG tablet Take 800 mg by mouth 3 (three) times daily.        .  hydrochlorothiazide 25 MG tablet Take 25 mg by mouth daily.        . hydrocortisone (PROCTOSOL HC) 2.5 % rectal cream Place 1 application rectally as needed.        Marland Kitchen levothyroxine (SYNTHROID, LEVOTHROID) 25 MCG tablet Take 25 mcg by mouth daily.        . metoprolol (TOPROL XL) 100 MG 24 hr tablet Take 1 tablet (100 mg total) by mouth daily. 1/2 twice daily  30 tablet  3  . ramipril (ALTACE) 10 MG tablet Take 10 mg by mouth daily.        Marland Kitchen warfarin (COUMADIN) 5 MG tablet 1 daily or as directed  90 tablet  3    Allergies  Allergen Reactions  . Amiodarone   . Amlodipine   . Crestor (Rosuvastatin Calcium)   . Lipitor (Atorvastatin Calcium)     Patient Active Problem List  Diagnoses  . Atrial fibrillation  . Chest discomfort  . Dizziness  . MVP (mitral valve prolapse)  . DOE (dyspnea on exertion)  . Bruises easily  . Back pain  . Forgetfulness  . Atrial fibrillation  . Hypertension  . Hypothyroidism  . Aortic stenosis  . Hyperlipidemia  . Diverticulitis  . Interstitial nephritis  . Neuropathy  . Ischemic heart disease  . Malaise and fatigue  History  Smoking status  . Never Smoker   Smokeless tobacco  . Not on file    History  Alcohol Use No    Family History  Problem Relation Age of Onset  . Cerebral aneurysm    . Cerebral aneurysm Father     Review of Systems: Constitutional: no fever chills diaphoresis or change in weight.  Head and neck: no hearing loss, no epistaxis, no photophobia or visual disturbance. Respiratory: No cough, shortness of breath or wheezing. Cardiovascular: No chest pain peripheral edema, palpitations. Gastrointestinal: No abdominal distention, no abdominal pain, no change in bowel habits hematochezia or melena. Genitourinary: No dysuria, no frequency, no urgency, no nocturia. Musculoskeletal:No arthralgias, no back pain, no gait disturbance or myalgias. Neurological: No dizziness, no headaches, no numbness, no seizures, no syncope,  no weakness, no tremors. Hematologic: No lymphadenopathy, no easy bruising. Psychiatric: No confusion, no hallucinations, no sleep disturbance.    Physical Exam: Filed Vitals:   04/15/11 0950  BP: 110/70  Pulse: 70  The general appearance reveals a well-developed well-nourished woman in no acute distress.The head and neck exam reveals pupils equal and reactive.  Extraocular movements are full.  There is no scleral icterus.  The mouth and pharynx are normal.  The neck is supple.  The carotids reveal no bruits.  The jugular venous pressure is normal.  The  thyroid is not enlarged.  There is no lymphadenopathy.  The chest is clear to percussion and auscultation.  There are no rales or rhonchi.  Expansion of the chest is symmetrical.  The precordium is quiet.  The first heart sound is normal.  The second heart sound is physiologically split.  There is no gallop rub or click.There is a soft systolic ejection murmur at the base.  The rhythm is irregular in atrial fibrillation.  There is no abnormal lift or heave.  The abdomen is soft and nontender.  The bowel sounds are normal.  The liver and spleen are not enlarged.  There are no abdominal masses.  There are no abdominal bruits.  Extremities reveal good pedal pulses.  There is no phlebitis or edema.  There is no cyanosis or clubbing.  Strength is normal and symmetrical in all extremities.  There is no lateralizing weakness.  There are no sensory deficits.  The skin is warm and dry.  There is no rash.   Assessment / Plan: The cause of her malaise and fatigue is not known.  She may be somewhat depressed from her husbands long illness we are checking baseline labs today including a CBC and hepatic function panel.  We are also checking a chest x-ray today.  Her INR is therapeutic at 2.9 and she'll continue same Coumadin.  Recheck in several months for a followup office visit

## 2011-04-15 NOTE — Assessment & Plan Note (Signed)
The patient is seen as a work in office visit today.  She was here for her protime and asked to be seen.  She has not been feeling well.  She's been having more fatigue.  She's not having any evidence of GI blood loss or anemia.  She denies any chest pain or angina.  She's not had to take any recent sublingual nitroglycerin.  She has been under more stress because her husband is still in a nursing home recovering from knee surgery.

## 2011-04-17 ENCOUNTER — Telehealth: Payer: Self-pay | Admitting: *Deleted

## 2011-04-17 NOTE — Telephone Encounter (Signed)
Advised patient of labs and CXR results

## 2011-04-23 ENCOUNTER — Encounter: Payer: Self-pay | Admitting: Cardiology

## 2011-04-28 ENCOUNTER — Ambulatory Visit (INDEPENDENT_AMBULATORY_CARE_PROVIDER_SITE_OTHER): Payer: Medicare Other | Admitting: Cardiology

## 2011-04-28 ENCOUNTER — Telehealth: Payer: Self-pay | Admitting: Cardiology

## 2011-04-28 ENCOUNTER — Ambulatory Visit (INDEPENDENT_AMBULATORY_CARE_PROVIDER_SITE_OTHER): Payer: Medicare Other | Admitting: *Deleted

## 2011-04-28 ENCOUNTER — Encounter: Payer: Self-pay | Admitting: Cardiology

## 2011-04-28 VITALS — BP 100/70 | HR 80 | Wt 149.0 lb

## 2011-04-28 DIAGNOSIS — I4891 Unspecified atrial fibrillation: Secondary | ICD-10-CM

## 2011-04-28 DIAGNOSIS — R600 Localized edema: Secondary | ICD-10-CM

## 2011-04-28 DIAGNOSIS — R609 Edema, unspecified: Secondary | ICD-10-CM

## 2011-04-28 LAB — PROTIME-INR: INR: 3.5 — AB (ref <=1.1)

## 2011-04-28 NOTE — Telephone Encounter (Signed)
See For office visit and protime today

## 2011-04-28 NOTE — Telephone Encounter (Signed)
6/12 INR 2.9.  Please advise

## 2011-04-28 NOTE — Telephone Encounter (Signed)
Called concerned that her foot turned blue from her toes to her ankle and said that there is a knot on the top of her foot. She claims that she doesn't remember hitting it and she says it doesn't hurt at all. She was wondering if Dr. Patty Sermons would want to take a look at it today. Please call back. I have pulled the chart.

## 2011-04-28 NOTE — Patient Instructions (Signed)
Warm soaks to left foot 3x day.   Keep left foot elevated.   Skip one day of coumadin tomorrow then continue usual dose.  Increase green vegetables.

## 2011-04-28 NOTE — Telephone Encounter (Signed)
Spoke with patient and scheduled appointment this afternoon.

## 2011-04-28 NOTE — Assessment & Plan Note (Signed)
The patient is seen as a work in.  She was concerned about swelling of her left foot.  There is no history of trauma.  She appears to have possibly had a insect bite on the foot which resulted in a ruptured vein and subsequent ecchymosis.The patient is on Coumadin for her atrial fibrillation and her INR today is excessive at 3.5

## 2011-04-28 NOTE — Progress Notes (Signed)
Natasha Livingston Date of Birth:  Apr 03, 1934 [facility] / [facility] [address]   [address] [place], [place]  [address] [phone]           Fax   [phone]  HPI: This pleasant 77 year old woman is seen as a work in office visit.  She has had swelling of her left foot for several days.  It began over the weekend.  She has been keeping the leg elevated.  She noted ecchymosis related to her Coumadin.  She has a past history of atrial fibrillation and is on long-term Coumadin.  She has a past history of ischemic heart disease and is status post CABG in 2008.  She has not been expressing any chest pain or shortness of breath.  Current Outpatient Prescriptions  Medication Sig Dispense Refill  . ALPRAZolam (XANAX) 0.25 MG tablet Take 0.25 mg by mouth at bedtime as needed.        Marland Kitchen aspirin 81 MG tablet Take 81 mg by mouth daily.        Marland Kitchen CALCIUM PO Take by mouth as needed.        Marland Kitchen Fexofenadine-Pseudoephedrine (ALLEGRA-D 12 HOUR PO) Take by mouth as needed.        . gabapentin (NEURONTIN) 800 MG tablet Take 800 mg by mouth 3 (three) times daily.        . hydrochlorothiazide 25 MG tablet Take 25 mg by mouth daily. Taking 1/2 daily      . hydrocortisone (PROCTOSOL HC) 2.5 % rectal cream Place 1 application rectally as needed.        Marland Kitchen levothyroxine (SYNTHROID, LEVOTHROID) 25 MCG tablet Take 25 mcg by mouth daily.        . metoprolol (TOPROL XL) 100 MG 24 hr tablet Take 1 tablet (100 mg total) by mouth daily. 1/2 twice daily  30 tablet  3  . ramipril (ALTACE) 10 MG tablet Take 10 mg by mouth daily.        Marland Kitchen warfarin (COUMADIN) 5 MG tablet 1 daily or as directed  90 tablet  3    Allergies  Allergen Reactions  . Amiodarone   . Amlodipine   . Crestor (Rosuvastatin Calcium)   . Lipitor (Atorvastatin Calcium)     Patient Active Problem List  Diagnoses  . Atrial fibrillation  . Chest discomfort  . Dizziness  . MVP (mitral valve prolapse)  . DOE (dyspnea on exertion)  .  Bruises easily  . Back pain  . Forgetfulness  . Atrial fibrillation  . Hypertension  . Hypothyroidism  . Aortic stenosis  . Hyperlipidemia  . Diverticulitis  . Interstitial nephritis  . Neuropathy  . Ischemic heart disease  . Malaise and fatigue  . Edema of foot    History  Smoking status  . Never Smoker   Smokeless tobacco  . Not on file    History  Alcohol Use No    Family History  Problem Relation Age of Onset  . Cerebral aneurysm    . Cerebral aneurysm Father     Review of Systems: The patient denies any heat or cold intolerance.  No weight gain or weight loss.  The patient denies headaches or blurry vision.  There is no cough or sputum production.  The patient denies dizziness.  There is no hematuria or hematochezia.  The patient denies any muscle aches or arthritis.  The patient denies any rash.  The patient denies frequent falling or instability.  There is no  history of depression or anxiety.  All other systems were reviewed and are negative.   Physical Exam: Filed Vitals:   04/28/11 1346  BP: 100/70  Pulse: 80  The general appearance reveals a well-developed well-nourished woman in no distress.Pupils equal and reactive.   Extraocular Movements are full.  There is no scleral icterus.  The mouth and pharynx are normal.  The neck is supple.  The carotids reveal no bruits.  The jugular venous pressure is normal.  The thyroid is not enlarged.  There is no lymphadenopathy.The chest is clear to percussion and auscultation. There are no rales or rhonchi. Expansion of the chest is symmetrical.  Heart reveals an irregular rhythm nitroglycerin fibrillation and no murmur gallop or rub.The abdomen is soft and nontender. Bowel sounds are normal. The liver and spleen are not enlarged. There Are no abdominal masses. There are no bruits.  Extremities reveal edema of the left foot and there is one very hard area toward the distal aspect of the dorsal surface of the left foot which may  be a insect bite.  There is no evidence of phlebitis or cellulitis.    Assessment / Plan: The patient is to limit her Coumadin tomorrow since she already took it today.  She will increase her dietary greens vegetables.  Continue present dose of Coumadin otherwise which is 5 mg daily.  Recheck a pro time in 2 weeks.  Apply warm soaks to the foot 3 times a day and keep the foot elevated

## 2011-04-29 ENCOUNTER — Other Ambulatory Visit: Payer: Self-pay | Admitting: *Deleted

## 2011-04-29 MED ORDER — HYDROCORTISONE 2.5 % RE CREA: TOPICAL_CREAM | RECTAL | Status: DC

## 2011-04-29 NOTE — Telephone Encounter (Signed)
Faxed signed rx back 

## 2011-05-13 ENCOUNTER — Ambulatory Visit (INDEPENDENT_AMBULATORY_CARE_PROVIDER_SITE_OTHER): Payer: Medicare Other | Admitting: *Deleted

## 2011-05-13 DIAGNOSIS — I4891 Unspecified atrial fibrillation: Secondary | ICD-10-CM

## 2011-05-13 LAB — POCT INR: INR: 1.8

## 2011-05-21 ENCOUNTER — Telehealth: Payer: Self-pay | Admitting: *Deleted

## 2011-05-21 NOTE — Telephone Encounter (Signed)
Called back and patients blood pressure remains elevated and she continues to feel bad.  Advised she needs to go to urgent care or the emergency room per  Dr. Patty Sermons

## 2011-05-21 NOTE — Telephone Encounter (Signed)
Home care phoned and patient blood pressure is 170/120 and 173/118 hr 84.  Will have her take an extra Altace and Toprol full tablet now.  Do not take 1/2 Toprol tonight unless still elevated.  Will check on pt prior to leaving.

## 2011-05-23 NOTE — Telephone Encounter (Signed)
Agree with advice given

## 2011-06-04 ENCOUNTER — Ambulatory Visit (INDEPENDENT_AMBULATORY_CARE_PROVIDER_SITE_OTHER): Payer: Medicare Other | Admitting: Cardiology

## 2011-06-04 ENCOUNTER — Ambulatory Visit (INDEPENDENT_AMBULATORY_CARE_PROVIDER_SITE_OTHER): Payer: Medicare Other | Admitting: *Deleted

## 2011-06-04 ENCOUNTER — Encounter: Payer: Self-pay | Admitting: Cardiology

## 2011-06-04 VITALS — BP 138/80 | HR 60 | Wt 144.0 lb

## 2011-06-04 DIAGNOSIS — I4891 Unspecified atrial fibrillation: Secondary | ICD-10-CM

## 2011-06-04 DIAGNOSIS — R6889 Other general symptoms and signs: Secondary | ICD-10-CM

## 2011-06-04 DIAGNOSIS — I259 Chronic ischemic heart disease, unspecified: Secondary | ICD-10-CM

## 2011-06-04 NOTE — Assessment & Plan Note (Signed)
The patient has a history of known ischemic heart disease.  She underwent coronary artery bypass graft surgery on 04/12/07 by Dr. Zenaida Niece trigt.She has not been experiencing any recurrent angina pectoris.

## 2011-06-04 NOTE — Assessment & Plan Note (Signed)
The patient has a past history of paroxysmal atrial flutter fibrillation.  She tolerates her arrhythmia well.  She generally cannot tell if she is in atrial fibrillation or not and therefore we have kept her on long-term Coumadin anticoagulation.  Her electrocardiogram today shows that she is in atrial flutter with a controlled ventricular response of 60.  She has not been expressing any recent chest pain or angina.  She has not had any TIA symptoms.

## 2011-06-04 NOTE — Assessment & Plan Note (Signed)
The patient has been having some problems with decreased memory.  She is under a lot of emotional stress at home helping to look after her husband who has metastatic prostate cancer to the bone and also had recent knee surgery.  I think she is doing well under the circumstances and today she seems to be reasonably sharp mentally.  Her son came with her today

## 2011-06-04 NOTE — Progress Notes (Signed)
Natasha Livingston Date of Birth:  May 13, 1934 [facility] / [facility] [address]   [address] [place], [place]  [address] [phone]           Fax   [phone]  History of Present Illness: This pleasant 77 is seen for a scheduled followup office visit.  She has a past history of paroxysmal atrial flutter fibrillation.  Today her electrocardiogram confirms that she is in atrial flutter with a controlled ventricular response.  She's not expressing any recent chest pain.  She has a remote history of ischemic heart disease and had coronary artery bypass graft surgery on 04/12/07.  She is on long-term Coumadin.Her INR today is therapeutic at 1.4 and her maintenance dose was increased today.  Current Outpatient Prescriptions  Medication Sig Dispense Refill  . ALPRAZolam (XANAX) 0.25 MG tablet Take 0.25 mg by mouth at bedtime as needed. 1/2 bid      . aspirin 81 MG tablet Take 81 mg by mouth daily.        Marland Kitchen CALCIUM PO Take by mouth as needed.        Marland Kitchen Fexofenadine-Pseudoephedrine (ALLEGRA-D 12 HOUR PO) Take by mouth as needed.        . gabapentin (NEURONTIN) 800 MG tablet Take 800 mg by mouth 3 (three) times daily.        . hydrochlorothiazide 25 MG tablet Take 25 mg by mouth daily. Taking 1/2 daily      . hydrocortisone (PROCTOSOL HC) 2.5 % rectal cream As directed  30 g  3  . levothyroxine (SYNTHROID, LEVOTHROID) 25 MCG tablet Take 25 mcg by mouth daily.        . metoprolol (TOPROL XL) 100 MG 24 hr tablet Take 1 tablet (100 mg total) by mouth daily. 1/2 twice daily  30 tablet  3  . ramipril (ALTACE) 10 MG tablet Take 10 mg by mouth daily.        Marland Kitchen warfarin (COUMADIN) 5 MG tablet 1 daily or as directed  90 tablet  3    Allergies  Allergen Reactions  . Amiodarone   . Amlodipine   . Crestor (Rosuvastatin Calcium)   . Lipitor (Atorvastatin Calcium)     Patient Active Problem List  Diagnoses  . Atrial fibrillation  . Chest discomfort  . Dizziness  . MVP (mitral valve  prolapse)  . DOE (dyspnea on exertion)  . Bruises easily  . Back pain  . Forgetfulness  . Atrial fibrillation  . Hypertension  . Hypothyroidism  . Aortic stenosis  . Hyperlipidemia  . Diverticulitis  . Interstitial nephritis  . Neuropathy  . Ischemic heart disease  . Malaise and fatigue  . Edema of foot    History  Smoking status  . Never Smoker   Smokeless tobacco  . Not on file    History  Alcohol Use No    Family History  Problem Relation Age of Onset  . Cerebral aneurysm    . Cerebral aneurysm Father     Review of Systems: Constitutional: no fever chills diaphoresis or fatigue or change in weight.  Head and neck: no hearing loss, no epistaxis, no photophobia or visual disturbance. Respiratory: No cough, shortness of breath or wheezing. Cardiovascular: No chest pain peripheral edema, palpitations. Gastrointestinal: No abdominal distention, no abdominal pain, no change in bowel habits hematochezia or melena. Genitourinary: No dysuria, no frequency, no urgency, no nocturia. Musculoskeletal:No arthralgias, no back pain, no gait disturbance or myalgias. Neurological: No dizziness, no  headaches, no numbness, no seizures, no syncope, no weakness, no tremors. Hematologic: No lymphadenopathy, no easy bruising. Psychiatric: No confusion, no hallucinations, no sleep disturbance.    Physical Exam: Filed Vitals:   06/04/11 1021  BP: 138/80  Pulse: 60  The general appearance reveals a well-developed well-nourished woman in no distress.Pupils equal and reactive.   Extraocular Movements are full.  There is no scleral icterus.  The mouth and pharynx are normal.  The neck is supple.  The carotids reveal no bruits.  The jugular venous pressure is normal.  The thyroid is not enlarged.  There is no lymphadenopathy.  The chest is clear to percussion and auscultation. There are no rales or rhonchi. Expansion of the chest is symmetrical.    Heart reveals a soft systolic ejection  murmur at the left sternal edge.  No diastolic murmur.  The rhythm is slightly irregular in atrial flutterThe abdomen is soft and nontender. Bowel sounds are normal. The liver and spleen are not enlarged. There Are no abdominal masses. There are no bruits.  The pedal pulses are good.  There is no phlebitis or edema.  There is no cyanosis or clubbing.  Strength is normal and symmetrical in all extremities.  There is no lateralizing weakness.  There are no sensory deficits.  The skin is warm and dry.  There is no rash.  EKG shows atrial flutter with a controlled ventricular response and left ventricular strain pattern.   Assessment / Plan: Continue same medication.  Increase warfarin and return in 2 weeks for followup warfarin check.  Recheck here in several months for followup office visit and EKG

## 2011-06-05 ENCOUNTER — Encounter: Payer: Self-pay | Admitting: Cardiology

## 2011-06-09 ENCOUNTER — Telehealth: Payer: Self-pay | Admitting: Cardiology

## 2011-06-09 ENCOUNTER — Other Ambulatory Visit: Payer: Self-pay | Admitting: *Deleted

## 2011-06-09 DIAGNOSIS — E079 Disorder of thyroid, unspecified: Secondary | ICD-10-CM

## 2011-06-09 MED ORDER — LEVOTHYROXINE SODIUM 25 MCG PO TABS: 25.0000 ug | ORAL_TABLET | Freq: Every day | ORAL | Status: DC

## 2011-06-09 NOTE — Telephone Encounter (Signed)
Advised on  Dr. Patty Sermons desk waiting to be filled out.  Will mail to her when completed

## 2011-06-09 NOTE — Telephone Encounter (Signed)
Refilled levothyroxine .

## 2011-06-09 NOTE — Telephone Encounter (Signed)
Pt states brought in a form from the [place] that needed Dr. Yevonne Pax signature and information on her condition, pt states she stopped by to drop this off last week, pt states gave directly to Dr. Patty Sermons, pt confirming if he has mailed it to her yet, please call pt back regarding this

## 2011-06-19 ENCOUNTER — Ambulatory Visit (INDEPENDENT_AMBULATORY_CARE_PROVIDER_SITE_OTHER): Payer: Medicare Other | Admitting: *Deleted

## 2011-06-19 DIAGNOSIS — I4891 Unspecified atrial fibrillation: Secondary | ICD-10-CM

## 2011-06-19 LAB — POCT INR: INR: 4.2

## 2011-06-24 ENCOUNTER — Telehealth: Payer: Self-pay | Admitting: Cardiology

## 2011-06-24 NOTE — Telephone Encounter (Signed)
Okay to wait until Friday

## 2011-06-24 NOTE — Telephone Encounter (Signed)
Pt called to check on her appts and said she has been bruising a lot and wanted to talk to you about it

## 2011-06-24 NOTE — Telephone Encounter (Signed)
Scheduled INR for 8/24 & advised patient

## 2011-06-24 NOTE — Telephone Encounter (Signed)
INR 4.2 on 8/16.  Was advised to hold 1 dose and decrease coumadin by 1/2 tablet weekly.  Still noticing a lot of bruising.  Did offer a INR, but wants to wait until Friday.  Is this ok, or sooner?

## 2011-06-27 ENCOUNTER — Ambulatory Visit (INDEPENDENT_AMBULATORY_CARE_PROVIDER_SITE_OTHER): Payer: Medicare Other | Admitting: *Deleted

## 2011-06-27 DIAGNOSIS — I4891 Unspecified atrial fibrillation: Secondary | ICD-10-CM

## 2011-06-27 LAB — POCT INR: INR: 1.7

## 2011-07-04 ENCOUNTER — Encounter: Payer: Medicare Other | Admitting: *Deleted

## 2011-07-08 ENCOUNTER — Other Ambulatory Visit: Payer: Self-pay | Admitting: *Deleted

## 2011-07-08 DIAGNOSIS — F419 Anxiety disorder, unspecified: Secondary | ICD-10-CM

## 2011-07-08 NOTE — Telephone Encounter (Signed)
Refilled meds per fax request.  

## 2011-07-11 ENCOUNTER — Ambulatory Visit (INDEPENDENT_AMBULATORY_CARE_PROVIDER_SITE_OTHER): Payer: Medicare Other | Admitting: *Deleted

## 2011-07-11 DIAGNOSIS — I4891 Unspecified atrial fibrillation: Secondary | ICD-10-CM

## 2011-07-12 MED ORDER — ALPRAZOLAM 0.25 MG PO TABS: 0.2500 mg | ORAL_TABLET | Freq: Every evening | ORAL | Status: DC | PRN

## 2011-07-24 ENCOUNTER — Other Ambulatory Visit: Payer: Self-pay | Admitting: Cardiology

## 2011-07-24 DIAGNOSIS — I4891 Unspecified atrial fibrillation: Secondary | ICD-10-CM

## 2011-07-24 MED ORDER — METOPROLOL SUCCINATE ER 100 MG PO TB24: ORAL_TABLET | ORAL | Status: DC

## 2011-07-24 MED ORDER — WARFARIN SODIUM 5 MG PO TABS: ORAL_TABLET | ORAL | Status: DC

## 2011-07-24 NOTE — Telephone Encounter (Signed)
Pt would like this called into [facility] in [place] [address] the pharmacy could not be found in the database. The number to contact them is [phone]. Please return pt call with any questions.

## 2011-07-25 ENCOUNTER — Other Ambulatory Visit: Payer: Self-pay | Admitting: *Deleted

## 2011-07-25 DIAGNOSIS — I4891 Unspecified atrial fibrillation: Secondary | ICD-10-CM

## 2011-08-01 ENCOUNTER — Encounter: Payer: Medicare Other | Admitting: *Deleted

## 2011-08-04 ENCOUNTER — Telehealth: Payer: Self-pay | Admitting: Cardiology

## 2011-08-04 ENCOUNTER — Ambulatory Visit (INDEPENDENT_AMBULATORY_CARE_PROVIDER_SITE_OTHER): Payer: Medicare Other | Admitting: *Deleted

## 2011-08-04 ENCOUNTER — Other Ambulatory Visit: Payer: Self-pay | Admitting: Cardiovascular Disease

## 2011-08-04 DIAGNOSIS — I4891 Unspecified atrial fibrillation: Secondary | ICD-10-CM

## 2011-08-12 ENCOUNTER — Telehealth: Payer: Self-pay | Admitting: Cardiology

## 2011-08-12 DIAGNOSIS — I119 Hypertensive heart disease without heart failure: Secondary | ICD-10-CM

## 2011-08-12 MED ORDER — RAMIPRIL 10 MG PO TABS: 10.0000 mg | ORAL_TABLET | Freq: Every day | ORAL | Status: DC

## 2011-08-12 NOTE — Telephone Encounter (Signed)
Pt needs a Rx written for Ramipril 10mg  qd so she can take it to [place] she would like it mailed to her

## 2011-08-12 NOTE — Telephone Encounter (Signed)
Mailed to patient as requested.

## 2011-08-14 NOTE — Telephone Encounter (Signed)
error 

## 2011-08-20 LAB — CBC
HCT: 30.8 — ABNORMAL LOW
Hemoglobin: 10.3 — ABNORMAL LOW
MCHC: 33.6
MCV: 89.3
Platelets: 354
RBC: 3.45 — ABNORMAL LOW
RDW: 16.5 — ABNORMAL HIGH
WBC: 11.4 — ABNORMAL HIGH

## 2011-08-20 LAB — BASIC METABOLIC PANEL
BUN: 17
CO2: 31
Calcium: 8.9
Chloride: 94 — ABNORMAL LOW
Creatinine, Ser: 1
GFR calc Af Amer: >60
GFR calc non Af Amer: 55 — ABNORMAL LOW
Glucose, Bld: 124 — ABNORMAL HIGH
Potassium: 4.3
Sodium: 134 — ABNORMAL LOW

## 2011-08-21 LAB — POCT I-STAT 3, ART BLOOD GAS (G3+)
Acid-Base Excess: 1
Acid-Base Excess: 1
Acid-base deficit: 1
Acid-base deficit: 1
Acid-base deficit: 2
Acid-base deficit: 3 — ABNORMAL HIGH
Bicarbonate: 20.7
Bicarbonate: 22.3
Bicarbonate: 22.7
Bicarbonate: 23.2
Bicarbonate: 23.9
Bicarbonate: 25.6 — ABNORMAL HIGH
O2 Saturation: 100
O2 Saturation: 100
O2 Saturation: 100
O2 Saturation: 98
O2 Saturation: 98
O2 Saturation: 99
Operator id: 137421
Operator id: 277261
Operator id: 277261
Operator id: 3342
Operator id: 3342
Operator id: 3342
Patient temperature: 35.1
Patient temperature: 37.1
Patient temperature: 37.3
TCO2: 22
TCO2: 23
TCO2: 24
TCO2: 24
TCO2: 25
TCO2: 27
pCO2 arterial: 26.3 — ABNORMAL LOW
pCO2 arterial: 28 — ABNORMAL LOW
pCO2 arterial: 30.6 — ABNORMAL LOW
pCO2 arterial: 33 — ABNORMAL LOW
pCO2 arterial: 40.1
pCO2 arterial: 42.6
pH, Arterial: 7.383
pH, Arterial: 7.388
pH, Arterial: 7.437 — ABNORMAL HIGH
pH, Arterial: 7.468 — ABNORMAL HIGH
pH, Arterial: 7.479 — ABNORMAL HIGH
pH, Arterial: 7.554 — ABNORMAL HIGH
pO2, Arterial: 100
pO2, Arterial: 105 — ABNORMAL HIGH
pO2, Arterial: 115 — ABNORMAL HIGH
pO2, Arterial: 374 — ABNORMAL HIGH
pO2, Arterial: 375 — ABNORMAL HIGH
pO2, Arterial: 402 — ABNORMAL HIGH

## 2011-08-21 LAB — CBC
HCT: 23.6 — ABNORMAL LOW
HCT: 24.2 — ABNORMAL LOW
HCT: 24.3 — ABNORMAL LOW
HCT: 24.7 — ABNORMAL LOW
HCT: 25.1 — ABNORMAL LOW
HCT: 25.6 — ABNORMAL LOW
HCT: 26.8 — ABNORMAL LOW
HCT: 27 — ABNORMAL LOW
HCT: 37.4
Hemoglobin: 12.6
Hemoglobin: 8.2 — ABNORMAL LOW
Hemoglobin: 8.2 — ABNORMAL LOW
Hemoglobin: 8.4 — ABNORMAL LOW
Hemoglobin: 8.4 — ABNORMAL LOW
Hemoglobin: 8.5 — ABNORMAL LOW
Hemoglobin: 8.8 — ABNORMAL LOW
Hemoglobin: 9 — ABNORMAL LOW
Hemoglobin: 9.1 — ABNORMAL LOW
MCHC: 33.2
MCHC: 33.4
MCHC: 33.7
MCHC: 33.9
MCHC: 34
MCHC: 34.3
MCHC: 34.5
MCHC: 34.7
MCHC: 34.8
MCV: 86.6
MCV: 86.7
MCV: 87
MCV: 87.2
MCV: 87.5
MCV: 87.7
MCV: 88.7
MCV: 88.8
MCV: 89.2
Platelets: 102 — ABNORMAL LOW
Platelets: 129 — ABNORMAL LOW
Platelets: 132 — ABNORMAL LOW
Platelets: 143 — ABNORMAL LOW
Platelets: 153
Platelets: 173
Platelets: 189
Platelets: 232
Platelets: 235
RBC: 2.72 — ABNORMAL LOW
RBC: 2.77 — ABNORMAL LOW
RBC: 2.78 — ABNORMAL LOW
RBC: 2.84 — ABNORMAL LOW
RBC: 2.85 — ABNORMAL LOW
RBC: 2.95 — ABNORMAL LOW
RBC: 3.01 — ABNORMAL LOW
RBC: 3.07 — ABNORMAL LOW
RBC: 4.21
RDW: 14.9 — ABNORMAL HIGH
RDW: 15.5 — ABNORMAL HIGH
RDW: 15.9 — ABNORMAL HIGH
RDW: 15.9 — ABNORMAL HIGH
RDW: 16.2 — ABNORMAL HIGH
RDW: 16.4 — ABNORMAL HIGH
RDW: 16.4 — ABNORMAL HIGH
RDW: 16.6 — ABNORMAL HIGH
RDW: 17 — ABNORMAL HIGH
WBC: 10.8 — ABNORMAL HIGH
WBC: 12.1 — ABNORMAL HIGH
WBC: 15.1 — ABNORMAL HIGH
WBC: 15.2 — ABNORMAL HIGH
WBC: 15.5 — ABNORMAL HIGH
WBC: 16.4 — ABNORMAL HIGH
WBC: 6.6
WBC: 6.9
WBC: 9.5

## 2011-08-21 LAB — BASIC METABOLIC PANEL
BUN: 14
BUN: 27 — ABNORMAL HIGH
BUN: 27 — ABNORMAL HIGH
BUN: 9
CO2: 20
CO2: 25
CO2: 26
CO2: 26
Calcium: 5.8 — CL
Calcium: 7.1 — ABNORMAL LOW
Calcium: 8.2 — ABNORMAL LOW
Calcium: 8.5
Chloride: 104
Chloride: 106
Chloride: 112
Chloride: 116 — ABNORMAL HIGH
Creatinine, Ser: 0.64
Creatinine, Ser: 0.97
Creatinine, Ser: 0.99
Creatinine, Ser: 1.13
GFR calc Af Amer: 57 — ABNORMAL LOW
GFR calc Af Amer: >60
GFR calc Af Amer: >60
GFR calc Af Amer: >60
GFR calc non Af Amer: 47 — ABNORMAL LOW
GFR calc non Af Amer: 55 — ABNORMAL LOW
GFR calc non Af Amer: 57 — ABNORMAL LOW
GFR calc non Af Amer: >60
Glucose, Bld: 100 — ABNORMAL HIGH
Glucose, Bld: 133 — ABNORMAL HIGH
Glucose, Bld: 83
Glucose, Bld: 87
Potassium: 3 — ABNORMAL LOW
Potassium: 3.5
Potassium: 3.9
Potassium: 4.2
Sodium: 135
Sodium: 137
Sodium: 139
Sodium: 144

## 2011-08-21 LAB — POCT I-STAT 3, VENOUS BLOOD GAS (G3P V)
Acid-base deficit: 3 — ABNORMAL HIGH
Bicarbonate: 21.6
O2 Saturation: 80
Operator id: 3342
TCO2: 23
pCO2, Ven: 36.1 — ABNORMAL LOW
pH, Ven: 7.385 — ABNORMAL HIGH
pO2, Ven: 44

## 2011-08-21 LAB — POCT I-STAT 4, (NA,K, GLUC, HGB,HCT)
Glucose, Bld: 100 — ABNORMAL HIGH
Glucose, Bld: 107 — ABNORMAL HIGH
Glucose, Bld: 107 — ABNORMAL HIGH
Glucose, Bld: 127 — ABNORMAL HIGH
Glucose, Bld: 168 — ABNORMAL HIGH
Glucose, Bld: 87
Glucose, Bld: 88
HCT: 20 — ABNORMAL LOW
HCT: 21 — ABNORMAL LOW
HCT: 22 — ABNORMAL LOW
HCT: 22 — ABNORMAL LOW
HCT: 25 — ABNORMAL LOW
HCT: 29 — ABNORMAL LOW
HCT: 33 — ABNORMAL LOW
Hemoglobin: 11.2 — ABNORMAL LOW
Hemoglobin: 6.8 — CL
Hemoglobin: 7.1 — CL
Hemoglobin: 7.5 — CL
Hemoglobin: 7.5 — CL
Hemoglobin: 8.5 — ABNORMAL LOW
Hemoglobin: 9.9 — ABNORMAL LOW
Operator id: 137421
Operator id: 3342
Operator id: 3342
Operator id: 3342
Operator id: 3342
Operator id: 3342
Operator id: 3342
Potassium: 3.2 — ABNORMAL LOW
Potassium: 3.7
Potassium: 3.7
Potassium: 3.9
Potassium: 4.9
Potassium: 5.1
Potassium: 5.5 — ABNORMAL HIGH
Sodium: 133 — ABNORMAL LOW
Sodium: 133 — ABNORMAL LOW
Sodium: 134 — ABNORMAL LOW
Sodium: 138
Sodium: 138
Sodium: 139
Sodium: 142

## 2011-08-21 LAB — I-STAT EC8
Acid-base deficit: 2
BUN: 8
Bicarbonate: 24
Chloride: 108
Glucose, Bld: 165 — ABNORMAL HIGH
HCT: 28 — ABNORMAL LOW
Hemoglobin: 9.5 — ABNORMAL LOW
Operator id: 285671
Potassium: 4
Sodium: 144
TCO2: 25
pCO2 arterial: 43.7
pH, Arterial: 7.347 — ABNORMAL LOW

## 2011-08-21 LAB — URINALYSIS, ROUTINE W REFLEX MICROSCOPIC
Bilirubin Urine: NEGATIVE
Bilirubin Urine: NEGATIVE
Glucose, UA: NEGATIVE
Glucose, UA: NEGATIVE
Hgb urine dipstick: NEGATIVE
Hgb urine dipstick: NEGATIVE
Ketones, ur: NEGATIVE
Ketones, ur: NEGATIVE
Leukocytes, UA: NEGATIVE
Nitrite: NEGATIVE
Nitrite: NEGATIVE
Protein, ur: NEGATIVE
Protein, ur: NEGATIVE
Specific Gravity, Urine: 1.014 (ref 1.005–1.035)
Specific Gravity, Urine: 1.017 (ref 1.005–1.035)
Urobilinogen, UA: 0.2
Urobilinogen, UA: 0.2
pH: 6.5
pH: 7

## 2011-08-21 LAB — COMPREHENSIVE METABOLIC PANEL
ALT: 17
AST: 22
Albumin: 3.7
Alkaline Phosphatase: 91
BUN: 12
CO2: 24
Calcium: 9.5
Chloride: 107
Creatinine, Ser: 0.86
GFR calc Af Amer: >60
GFR calc non Af Amer: >60
Glucose, Bld: 115 — ABNORMAL HIGH
Potassium: 4.1
Sodium: 141
Total Bilirubin: 0.5
Total Protein: 7.2

## 2011-08-21 LAB — CLOSTRIDIUM DIFFICILE EIA: C difficile Toxins A+B, EIA: NEGATIVE

## 2011-08-21 LAB — URINE CULTURE: Colony Count: 5000

## 2011-08-21 LAB — BLOOD GAS, ARTERIAL
Acid-Base Excess: 2.9 — ABNORMAL HIGH
Bicarbonate: 26.8 — ABNORMAL HIGH
Drawn by: 274481
O2 Saturation: 97.6
Patient temperature: 37
TCO2: 28
pCO2 arterial: 39.7
pH, Arterial: 7.444 — ABNORMAL HIGH
pO2, Arterial: 100

## 2011-08-21 LAB — URINE MICROSCOPIC-ADD ON
Bacteria, UA: NONE SEEN
RBC / HPF: NONE SEEN

## 2011-08-21 LAB — TYPE AND SCREEN
ABO/RH(D): O POS
Antibody Screen: NEGATIVE

## 2011-08-21 LAB — APTT
aPTT: 39 — ABNORMAL HIGH
aPTT: 45 — ABNORMAL HIGH

## 2011-08-21 LAB — PROTIME-INR
INR: 1
INR: 1.9 — ABNORMAL HIGH
Prothrombin Time: 13.3
Prothrombin Time: 22.4 — ABNORMAL HIGH

## 2011-08-21 LAB — HEMOGLOBIN A1C: Hgb A1c MFr Bld: 6.9 — ABNORMAL HIGH

## 2011-08-21 LAB — CREATININE, SERUM
Creatinine, Ser: 0.77
Creatinine, Ser: 1.07
GFR calc Af Amer: >60
GFR calc Af Amer: >60
GFR calc non Af Amer: 51 — ABNORMAL LOW
GFR calc non Af Amer: >60

## 2011-08-21 LAB — PREPARE PLATELET PHERESIS

## 2011-08-21 LAB — HEMOGLOBIN AND HEMATOCRIT, BLOOD
HCT: 25.6 — ABNORMAL LOW
Hemoglobin: 8.6 — ABNORMAL LOW

## 2011-08-21 LAB — MAGNESIUM
Magnesium: 2.2
Magnesium: 2.3
Magnesium: 2.6 — ABNORMAL HIGH

## 2011-08-21 LAB — ABO/RH: ABO/RH(D): O POS

## 2011-08-21 LAB — PLATELET COUNT: Platelets: 147 — ABNORMAL LOW

## 2011-08-25 ENCOUNTER — Encounter: Payer: Medicare Other | Admitting: *Deleted

## 2011-08-27 ENCOUNTER — Ambulatory Visit (INDEPENDENT_AMBULATORY_CARE_PROVIDER_SITE_OTHER): Payer: Medicare Other | Admitting: *Deleted

## 2011-08-27 DIAGNOSIS — I4891 Unspecified atrial fibrillation: Secondary | ICD-10-CM

## 2011-08-27 DIAGNOSIS — Z7901 Long term (current) use of anticoagulants: Secondary | ICD-10-CM

## 2011-09-05 ENCOUNTER — Ambulatory Visit (INDEPENDENT_AMBULATORY_CARE_PROVIDER_SITE_OTHER): Payer: Medicare Other | Admitting: Cardiology

## 2011-09-05 ENCOUNTER — Encounter: Payer: Self-pay | Admitting: Cardiology

## 2011-09-05 VITALS — BP 138/90 | HR 60 | Ht 67.0 in | Wt 147.0 lb

## 2011-09-05 DIAGNOSIS — E7800 Pure hypercholesterolemia, unspecified: Secondary | ICD-10-CM

## 2011-09-05 DIAGNOSIS — E785 Hyperlipidemia, unspecified: Secondary | ICD-10-CM

## 2011-09-05 DIAGNOSIS — I4892 Unspecified atrial flutter: Secondary | ICD-10-CM

## 2011-09-05 DIAGNOSIS — I119 Hypertensive heart disease without heart failure: Secondary | ICD-10-CM

## 2011-09-05 DIAGNOSIS — I259 Chronic ischemic heart disease, unspecified: Secondary | ICD-10-CM

## 2011-09-05 DIAGNOSIS — E039 Hypothyroidism, unspecified: Secondary | ICD-10-CM

## 2011-09-05 DIAGNOSIS — R5383 Other fatigue: Secondary | ICD-10-CM

## 2011-09-05 NOTE — Assessment & Plan Note (Signed)
The patient has been having no recurrent angina pectoris.  She is less short of breath with exertion, such as climbing up and downstairs

## 2011-09-05 NOTE — Progress Notes (Signed)
Natasha Livingston Date of Birth:  December 05, 1933 [facility] / [place] [facility] [address]   [address] [place], [place]  [address] [phone]           Fax   [phone]  History of Present Illness: This pleasant 77 year old woman is seen for a scheduled followup office visit.  She has a past history of paroxysmal atrial fibrillation.  She is essentially asymptomatic with this rhythm as long as her rate is controlled.  We elected to leave her on long-term Coumadin with rate control.  Occasionally, she will feel her heart pounding, and she will take an extra half metoprolol.  Patient has a history of ischemic heart disease and had coronary artery bypass graft surgery in 04/12/07.  Current Outpatient Prescriptions  Medication Sig Dispense Refill  . ALPRAZolam (XANAX) 0.25 MG tablet Take 1 tablet (0.25 mg total) by mouth at bedtime as needed. 1/2 bid  30 tablet  5  . aspirin 81 MG tablet Take 81 mg by mouth daily.        Marland Kitchen Fexofenadine-Pseudoephedrine (ALLEGRA-D 12 HOUR PO) Take by mouth as needed.        . gabapentin (NEURONTIN) 800 MG tablet Take 800 mg by mouth 3 (three) times daily.        . hydrochlorothiazide 25 MG tablet Take 25 mg by mouth daily. Taking 1/2 daily      . hydrocortisone (PROCTOSOL HC) 2.5 % rectal cream As directed  30 g  3  . levothyroxine (SYNTHROID, LEVOTHROID) 25 MCG tablet Take 1 tablet (25 mcg total) by mouth daily.  30 tablet  11  . metoprolol (TOPROL-XL) 100 MG 24 hr tablet 1/2 twice daily and may take extra 1/2 daily as needed       . ramipril (ALTACE) 10 MG tablet Take 1 tablet (10 mg total) by mouth daily.  90 tablet  3  . warfarin (COUMADIN) 5 MG tablet 1 daily or as directed  90 tablet  3  . DISCONTD: metoprolol (TOPROL-XL) 100 MG 24 hr tablet 1/2 twice daily  30 tablet  11  . DISCONTD: metoprolol (TOPROL-XL) 100 MG 24 hr tablet TAKE 1/2 TABLET TWICE DAILY( FOR TOTAL 1 DAILY)  30 tablet  3    Allergies  Allergen Reactions  . Amiodarone   .  Amlodipine   . Crestor (Rosuvastatin Calcium)   . Lipitor (Atorvastatin Calcium)     Patient Active Problem List  Diagnoses  . Atrial fibrillation  . Chest discomfort  . Dizziness  . MVP (mitral valve prolapse)  . DOE (dyspnea on exertion)  . Bruises easily  . Back pain  . Forgetfulness  . Atrial fibrillation  . Hypertension  . Hypothyroidism  . Aortic stenosis  . Hyperlipidemia  . Diverticulitis  . Interstitial nephritis  . Neuropathy  . Ischemic heart disease  . Malaise and fatigue  . Edema of foot  . Encounter for long-term (current) use of anticoagulants    History  Smoking status  . Never Smoker   Smokeless tobacco  . Not on file    History  Alcohol Use No    Family History  Problem Relation Age of Onset  . Cerebral aneurysm    . Cerebral aneurysm Father     Review of Systems: Constitutional: no fever chills diaphoresis or fatigue or change in weight.  Head and neck: no hearing loss, no epistaxis, no photophobia or visual disturbance. Respiratory: No cough, shortness of breath or wheezing. Cardiovascular: No chest pain  peripheral edema, palpitations. Gastrointestinal: No abdominal distention, no abdominal pain, no change in bowel habits hematochezia or melena. Genitourinary: No dysuria, no frequency, no urgency, no nocturia. Musculoskeletal:No arthralgias, no back pain, no gait disturbance or myalgias. Neurological: No dizziness, no headaches, no numbness, no seizures, no syncope, no weakness, no tremors. Hematologic: No lymphadenopathy, no easy bruising. Psychiatric: No confusion, no hallucinations, no sleep disturbance.    Physical Exam: Filed Vitals:   09/05/11 1029  BP: 138/90  Pulse: 60   general appearance reveals a well-developed, well-nourished woman in no distress.Pupils equal and reactive.   Extraocular Movements are full.  There is no scleral icterus.  The mouth and pharynx are normal.  The neck is supple.  The carotids reveal no  bruits.  The jugular venous pressure is normal.  The thyroid is not enlarged.  There is no lymphadenopathy.  The chest is clear to percussion and auscultation. There are no rales or rhonchi. Expansion of the chest is symmetrical.  The precordium is quiet.  The first heart sound is normal.  The second heart sound is physiologically split.  There is no murmur gallop rub or click.  There is no abnormal lift or heave.  The rhythm is slightly irregular  The abdomen is soft and nontender. Bowel sounds are normal. The liver and spleen are not enlarged. There Are no abdominal masses. There are no bruits.  The pedal pulses are good.  There is no phlebitis or edema.  There is no cyanosis or clubbing. Strength is normal and symmetrical in all extremities.  There is no lateralizing weakness.  There are no sensory deficits.  EKG today shows atrial flutter with a slow controlled ventricular response and wide spread inferolateral T-wave changes which are unchanged from prior tracings.     Assessment / Plan:  Continue same medication.  Recheck in 4 months for followup office visit and fasting lab work including also thyroid functions

## 2011-09-05 NOTE — Patient Instructions (Signed)
Your physician recommends that you schedule a follow-up appointment in: 4 months with fasting labs  Your physician recommends that you continue on your current medications as directed. Please refer to the Current Medication list given to you today.  Watch your sodium (salt) intake Sodium-Controlled Diet Sodium is a mineral. It is found in many foods. Sodium may be found naturally or added during the making of a food. The most common form of sodium is salt, which is made up of sodium and chloride. Reducing your sodium intake involves changing your eating habits. The following guidelines will help you reduce the sodium in your diet:  Stop using the salt shaker.   Use salt sparingly in cooking and baking.   Substitute with sodium-free seasonings and spices.   Do not use a salt substitute (potassium chloride) without your caregiver's permission.   Include a variety of fresh, unprocessed foods in your diet.   Limit the use of processed and convenience foods that are high in sodium.  USE THE FOLLOWING FOODS SPARINGLY: Breads/Starches  Commercial bread stuffing, commercial pancake or waffle mixes, coating mixes. Waffles. Croutons. Prepared (boxed or frozen) potato, rice, or noodle mixes that contain salt or sodium. Salted [place] fries or hash browns. Salted popcorn, breads, crackers, chips, or snack foods.  Vegetables  Vegetables canned with salt or prepared in cream, butter, or cheese sauces. Sauerkraut. Tomato or vegetable juices canned with salt.   Fresh vegetables are allowed if rinsed thoroughly.  Fruit  Fruit is okay to eat.  Meat and Meat Substitutes  Salted or smoked meats, such as bacon or Canadian bacon, chipped or corned beef, hot dogs, salt pork, luncheon meats, pastrami, ham, or sausage. Canned or smoked fish, poultry, or meat. Processed cheese or cheese spreads, blue or Roquefort cheese. Battered or frozen fish products. Prepared spaghetti sauce. Baked beans. Reuben  sandwiches. Salted nuts. Caviar.  Milk  Limit buttermilk to 1 cup per week.  Soups and Combination Foods  Bouillon cubes, canned or dried soups, broth, consomm. Convenience (frozen or packaged) dinners with more than 600 mg sodium. Pot pies, pizza, Asian food, fast food cheeseburgers, and specialty sandwiches.  Desserts and Sweets  Regular (salted) desserts, pie, commercial fruit snack pies, commercial snack cakes, canned puddings.   Eat desserts and sweets in moderation.  Fats and Oils  Gravy mixes or canned gravy. No more than 1 to 2 tbs of salad dressing. Chip dips.   Eat fats and oils in moderation.  Beverages  See those listed under the vegetables and milk groups.  Condiments  Ketchup, mustard, meat sauces, salsa, regular (salted) and lite soy sauce or mustard. Dill pickles, olives, meat tenderizer. Prepared horseradish or pickle relish. Dutch-processed cocoa. Baking powder or baking soda used medicinally. Worcestershire sauce. "Light" salt. Salt substitute, unless approved by your caregiver.  Document Released: 04/11/2002 Document Revised: 07/02/2011 Document Reviewed: 11/12/2009 [facility] Patient Information 2012 [place], [place].

## 2011-09-05 NOTE — Assessment & Plan Note (Signed)
Patient has a history of malaise, and fatigue.  This has improved since her thyroid medication has been adjusted.  Recently, she has been feeling fairly well.  Her energy level has improved.

## 2011-09-05 NOTE — Assessment & Plan Note (Signed)
The patient has a past history of hyperlipidemia.  She is presently on any lipid lowering medication.  We will plan to check fasting lab work on her next visit

## 2011-09-24 ENCOUNTER — Encounter: Payer: Medicare Other | Admitting: *Deleted

## 2011-09-29 ENCOUNTER — Ambulatory Visit (INDEPENDENT_AMBULATORY_CARE_PROVIDER_SITE_OTHER): Payer: Medicare Other | Admitting: Cardiology

## 2011-09-29 ENCOUNTER — Ambulatory Visit (INDEPENDENT_AMBULATORY_CARE_PROVIDER_SITE_OTHER): Payer: Medicare Other | Admitting: *Deleted

## 2011-09-29 ENCOUNTER — Encounter: Payer: Self-pay | Admitting: Cardiology

## 2011-09-29 DIAGNOSIS — I4891 Unspecified atrial fibrillation: Secondary | ICD-10-CM

## 2011-09-29 DIAGNOSIS — Z7901 Long term (current) use of anticoagulants: Secondary | ICD-10-CM

## 2011-09-29 DIAGNOSIS — I341 Nonrheumatic mitral (valve) prolapse: Secondary | ICD-10-CM

## 2011-09-29 DIAGNOSIS — R5381 Other malaise: Secondary | ICD-10-CM

## 2011-09-29 DIAGNOSIS — I059 Rheumatic mitral valve disease, unspecified: Secondary | ICD-10-CM

## 2011-09-29 LAB — POCT INR: INR: 2.1

## 2011-09-29 NOTE — Assessment & Plan Note (Signed)
The patient has not been experiencing any symptoms from her mitral valve prolapse.

## 2011-09-29 NOTE — Assessment & Plan Note (Signed)
The patient is on a large dose of gabapentin for her idiopathic peripheral neuropathy.  She has not had a recent visit with her neurologist.  Presently she is taking gabapentin 800 mg 3 times a day.  I suspect that this is the cause of her malaise and fatigue joint the day.  We will decrease the gabapentin 2 400 mg in the morning and 800 mg at at bedtime and observe response.

## 2011-09-29 NOTE — Patient Instructions (Signed)
Decrease your Gabapentin to 400 mg in the morning and 800 mg in the evening. Keep your scheduled appointment on March 8 at 9:45 with fasting labs

## 2011-09-29 NOTE — Assessment & Plan Note (Signed)
The patient does have a history of chronic atrial fibrillation confirmed by EKG today.  Her INR today is therapeutic.  She does have easy bruising on her arms

## 2011-09-29 NOTE — Progress Notes (Signed)
Natasha Livingston Date of Birth:  11-23-1933 [facility] / [facility] [address]   [address] [place], [place]  [address] [phone]           Fax   [phone]  History of Present Illness: This pleasant 77 year old woman is seen for a scheduled followup office visit.  This is actually a work in the office visit.  She comes in ahead of schedule because she has been feeling much more tired.  She tends to fall sleep readily join a day.  We reviewed her medications.  She has been on gabapentin 800 mg 3 times a day.  She has a history of coronary artery disease and is status post CABG in June 2008.  She has established atrial fibrillation and is on long-term Coumadin.  She has a history of hypothyroidism and is on Synthroid.  She has a history of essential hypertension.  Current Outpatient Prescriptions  Medication Sig Dispense Refill  . ALPRAZolam (XANAX) 0.25 MG tablet Take 1 tablet (0.25 mg total) by mouth at bedtime as needed. 1/2 bid  30 tablet  5  . aspirin 81 MG tablet Take 81 mg by mouth daily.        Marland Kitchen Fexofenadine-Pseudoephedrine (ALLEGRA-D 12 HOUR PO) Take by mouth as needed.        . gabapentin (NEURONTIN) 800 MG tablet Take 800 mg by mouth as directed. 400 mg in morning and 800 mg at bedtime      . hydrochlorothiazide 25 MG tablet Take 25 mg by mouth daily. Taking 1/2 daily      . hydrocortisone (PROCTOSOL HC) 2.5 % rectal cream As directed  30 g  3  . levothyroxine (SYNTHROID, LEVOTHROID) 25 MCG tablet Take 1 tablet (25 mcg total) by mouth daily.  30 tablet  11  . metoprolol (TOPROL-XL) 100 MG 24 hr tablet 1/2 TID and may take extra 1/2 daily as needed      . ramipril (ALTACE) 10 MG tablet Take 1 tablet (10 mg total) by mouth daily.  90 tablet  3  . warfarin (COUMADIN) 5 MG tablet 1 daily or as directed  90 tablet  3    Allergies  Allergen Reactions  . Amiodarone   . Amlodipine   . Crestor (Rosuvastatin Calcium)   . Lipitor (Atorvastatin Calcium)      Patient Active Problem List  Diagnoses  . Atrial fibrillation  . Chest discomfort  . Dizziness  . MVP (mitral valve prolapse)  . DOE (dyspnea on exertion)  . Bruises easily  . Back pain  . Forgetfulness  . Atrial fibrillation  . Hypertension  . Hypothyroidism  . Aortic stenosis  . Hyperlipidemia  . Diverticulitis  . Interstitial nephritis  . Neuropathy  . Ischemic heart disease  . Malaise and fatigue  . Edema of foot  . Encounter for long-term (current) use of anticoagulants    History  Smoking status  . Never Smoker   Smokeless tobacco  . Not on file    History  Alcohol Use No    Family History  Problem Relation Age of Onset  . Cerebral aneurysm    . Cerebral aneurysm Father     Review of Systems: Constitutional: no fever chills diaphoresis or fatigue or change in weight.  Head and neck: no hearing loss, no epistaxis, no photophobia or visual disturbance. Respiratory: No cough, shortness of breath or wheezing. Cardiovascular: No chest pain peripheral edema, palpitations. Gastrointestinal: No abdominal distention, no abdominal pain, no  change in bowel habits hematochezia or melena. Genitourinary: No dysuria, no frequency, no urgency, no nocturia. Musculoskeletal:No arthralgias, no back pain, no gait disturbance or myalgias. Neurological: No dizziness, no headaches, no numbness, no seizures, no syncope, no weakness, no tremors. Hematologic: No lymphadenopathy, no easy bruising. Psychiatric: No confusion, no hallucinations, no sleep disturbance.    Physical Exam: Filed Vitals:   09/29/11 1216  BP: 138/88  Pulse: 71   the general appearance reveals a well-developed well-nourished elderly woman in no distress.Pupils equal and reactive.   Extraocular Movements are full.  There is no scleral icterus.  The mouth and pharynx are normal.  The neck is supple.  The carotids reveal no bruits.  The jugular venous pressure is normal.  The thyroid is not enlarged.   There is no lymphadenopathy.  The chest is clear to percussion and auscultation. There are no rales or rhonchi. Expansion of the chest is symmetrical.  Heart reveals a soft apical systolic murmur.  No gallop or rub The abdomen is soft and nontender. Bowel sounds are normal. The liver and spleen are not enlarged. There Are no abdominal masses. There are no bruits.  The pedal pulses are good.  There is no phlebitis or edema.  There is no cyanosis or clubbing. Strength is normal and symmetrical in all extremities.  There is no lateralizing weakness.  There are no sensory deficits.  The skin is warm and dry.  There is no rash.  Easy bruising on arms and her to minor trauma secondary to warfarin anticoagulation  EKG today shows atrial flutter fibrillation with controlled ventricular response and pattern of marked ST changes unchanged from prior EKGs.  Assessment / Plan:  Continue same medication except reduce gabapentin as noted above and observe for improvement in daytime sedation.  Recheck at regular visit and we will also add an CBC 2 her labs for next time.

## 2011-10-24 ENCOUNTER — Other Ambulatory Visit: Payer: Self-pay | Admitting: *Deleted

## 2011-10-24 MED ORDER — HYDROCHLOROTHIAZIDE 25 MG PO TABS: 25.0000 mg | ORAL_TABLET | Freq: Every day | ORAL | Status: DC

## 2011-10-24 NOTE — Telephone Encounter (Signed)
Refilled hctz 

## 2011-11-06 ENCOUNTER — Other Ambulatory Visit: Payer: Self-pay | Admitting: *Deleted

## 2011-11-06 MED ORDER — HYDROCHLOROTHIAZIDE 25 MG PO TABS: 25.0000 mg | ORAL_TABLET | Freq: Every day | ORAL | Status: DC

## 2011-11-06 NOTE — Telephone Encounter (Signed)
Refilled hctz 

## 2011-11-10 ENCOUNTER — Ambulatory Visit (INDEPENDENT_AMBULATORY_CARE_PROVIDER_SITE_OTHER): Payer: Medicare Other | Admitting: *Deleted

## 2011-11-10 DIAGNOSIS — I4891 Unspecified atrial fibrillation: Secondary | ICD-10-CM | POA: Diagnosis not present

## 2011-11-10 DIAGNOSIS — Z7901 Long term (current) use of anticoagulants: Secondary | ICD-10-CM | POA: Diagnosis not present

## 2011-11-10 LAB — POCT INR: INR: 3.1

## 2011-12-17 ENCOUNTER — Telehealth: Payer: Self-pay | Admitting: Cardiology

## 2011-12-17 NOTE — Telephone Encounter (Signed)
Okay 

## 2011-12-17 NOTE — Telephone Encounter (Signed)
New problem:  Patient receive the wrong strength alprazolam .05mg  instead of 0.25 mg. Patient is aware.

## 2011-12-17 NOTE — Telephone Encounter (Signed)
Pharmacy phoned and last 3 refills of Xanax were filled in error for .5 mg instead of .25 mg. Patient is aware and ok with it.  Pharmacist just wanted to let  Dr. Patty Sermons know.  Will forward to  Dr. Patty Sermons

## 2011-12-19 ENCOUNTER — Telehealth: Payer: Self-pay | Admitting: Cardiology

## 2011-12-19 NOTE — Telephone Encounter (Signed)
F/U  Patient calling in ck the status of return call from nurse as she really doesn't feel good. Patient experiencing SOB, fatigue, chest pain would like to be seen today. Will forward as high priority. Patient can be reached at hm# , son waiting at patient house to bring patient in.

## 2011-12-19 NOTE — Telephone Encounter (Signed)
Spoke with patient and states she just doesn't feel good and this has been going on for a week or more.  When asked if she has been having chest pains stated comes and goes from time to time. Did have to use NTG last night and doesn't know that it helped much. . Thinks the she feels worse after taking her Metoprolol and has to lay down, fatigue but vague about s/s.  Has been real nervous and jerking lately, uses 1/2 Xanax at hs and in am.  Will forward to  Dr. Patty Sermons for review

## 2011-12-19 NOTE — Telephone Encounter (Signed)
States she takes 1/2 twice daily, advised to just take 1/2 daily.  Will make this change and let us know how she is doing. Go to ER if no better or worse.  Stated she did feel better after taking her Xanax

## 2011-12-19 NOTE — Telephone Encounter (Signed)
Thanks

## 2011-12-19 NOTE — Telephone Encounter (Signed)
The metoprolol may be causing some of her symptoms. Instead of 1/2 TID cut back to just once or twice a day over the weekend and see if Sx improve. She should call us Monday to report.  Go to ER this weekend if she gets worse.

## 2011-12-19 NOTE — Telephone Encounter (Signed)
New msg Pt wants to talk to you about metoprolol. She said she feels worse when she takes this med. Please call her back

## 2011-12-24 ENCOUNTER — Encounter: Payer: Self-pay | Admitting: Cardiology

## 2011-12-24 ENCOUNTER — Ambulatory Visit (INDEPENDENT_AMBULATORY_CARE_PROVIDER_SITE_OTHER): Payer: Medicare Other | Admitting: *Deleted

## 2011-12-24 ENCOUNTER — Ambulatory Visit (INDEPENDENT_AMBULATORY_CARE_PROVIDER_SITE_OTHER)
Admission: RE | Admit: 2011-12-24 | Discharge: 2011-12-24 | Disposition: A | Discharge status: Home / Self Care | Payer: Medicare Other | Source: Ambulatory Visit | Attending: Cardiology | Admitting: Cardiology

## 2011-12-24 ENCOUNTER — Ambulatory Visit (INDEPENDENT_AMBULATORY_CARE_PROVIDER_SITE_OTHER): Payer: Medicare Other | Admitting: Cardiology

## 2011-12-24 VITALS — BP 176/104 | HR 78 | Ht 67.0 in | Wt 144.0 lb

## 2011-12-24 DIAGNOSIS — R0600 Dyspnea, unspecified: Secondary | ICD-10-CM

## 2011-12-24 DIAGNOSIS — I341 Nonrheumatic mitral (valve) prolapse: Secondary | ICD-10-CM

## 2011-12-24 DIAGNOSIS — R0609 Other forms of dyspnea: Secondary | ICD-10-CM

## 2011-12-24 DIAGNOSIS — R0602 Shortness of breath: Secondary | ICD-10-CM | POA: Diagnosis not present

## 2011-12-24 DIAGNOSIS — Z7901 Long term (current) use of anticoagulants: Secondary | ICD-10-CM | POA: Diagnosis not present

## 2011-12-24 DIAGNOSIS — R5383 Other fatigue: Secondary | ICD-10-CM

## 2011-12-24 DIAGNOSIS — I119 Hypertensive heart disease without heart failure: Secondary | ICD-10-CM | POA: Diagnosis not present

## 2011-12-24 DIAGNOSIS — I4891 Unspecified atrial fibrillation: Secondary | ICD-10-CM

## 2011-12-24 DIAGNOSIS — I10 Essential (primary) hypertension: Secondary | ICD-10-CM | POA: Diagnosis not present

## 2011-12-24 DIAGNOSIS — R0989 Other specified symptoms and signs involving the circulatory and respiratory systems: Secondary | ICD-10-CM | POA: Diagnosis not present

## 2011-12-24 DIAGNOSIS — E039 Hypothyroidism, unspecified: Secondary | ICD-10-CM | POA: Diagnosis not present

## 2011-12-24 DIAGNOSIS — R5381 Other malaise: Secondary | ICD-10-CM | POA: Diagnosis not present

## 2011-12-24 DIAGNOSIS — I059 Rheumatic mitral valve disease, unspecified: Secondary | ICD-10-CM

## 2011-12-24 LAB — BASIC METABOLIC PANEL WITH GFR
BUN: 22 mg/dL (ref 6–23)
CO2: 29 meq/L (ref 19–32)
Calcium: 9.4 mg/dL (ref 8.4–10.5)
Chloride: 103 meq/L (ref 96–112)
Creatinine, Ser: 0.9 mg/dL (ref 0.4–1.2)
GFR: 67.31 mL/min
Glucose, Bld: 105 mg/dL — ABNORMAL HIGH (ref 70–99)
Potassium: 3.6 meq/L (ref 3.5–5.1)
Sodium: 140 meq/L (ref 135–145)

## 2011-12-24 LAB — CBC WITH DIFFERENTIAL/PLATELET
Basophils Absolute: 0 K/uL (ref 0.0–0.1)
Basophils Relative: 0.3 % (ref 0.0–3.0)
Eosinophils Absolute: 0.1 K/uL (ref 0.0–0.7)
Eosinophils Relative: 1 % (ref 0.0–5.0)
HCT: 42.5 % (ref 36.0–46.0)
Hemoglobin: 14.3 g/dL (ref 12.0–15.0)
Lymphocytes Relative: 13.9 % (ref 12.0–46.0)
Lymphs Abs: 1.1 K/uL (ref 0.7–4.0)
MCHC: 33.7 g/dL (ref 30.0–36.0)
MCV: 95.2 fl (ref 78.0–100.0)
Monocytes Absolute: 0.8 K/uL (ref 0.1–1.0)
Monocytes Relative: 10 % (ref 3.0–12.0)
Neutro Abs: 6 K/uL (ref 1.4–7.7)
Neutrophils Relative %: 74.8 % (ref 43.0–77.0)
Platelets: 205 K/uL (ref 150.0–400.0)
RBC: 4.46 Mil/uL (ref 3.87–5.11)
RDW: 14.5 % (ref 11.5–14.6)
WBC: 8 K/uL (ref 4.5–10.5)

## 2011-12-24 LAB — TSH: TSH: 1.67 u[IU]/mL (ref 0.35–5.50)

## 2011-12-24 MED ORDER — AMLODIPINE BESYLATE 2.5 MG PO TABS: 2.5000 mg | ORAL_TABLET | Freq: Every day | ORAL | Status: DC

## 2011-12-24 NOTE — Progress Notes (Signed)
Natasha Livingston Date of Birth:  03-Sep-1934 [facility] [address] [place], [place]  [address] [phone]         Fax   [phone]  History of Present Illness: This pleasant 78 year old woman is seen as a work in the office visit.  She was here for her Coumadin checked and complained of being short of breath.  She states that she has dyspnea with any activities at home.  She has not been expressing any chest pain or angina.  She has noted occasional racing of her heart.  She complains of poor appetite.  She is on long-term Coumadin and her INR today was 3.0.  Current Outpatient Prescriptions  Medication Sig Dispense Refill  . ALPRAZolam (XANAX) 0.25 MG tablet Take 1 tablet (0.25 mg total) by mouth at bedtime as needed. 1/2 bid  30 tablet  5  . aspirin 81 MG tablet Take 81 mg by mouth daily.        Marland Kitchen Fexofenadine-Pseudoephedrine (ALLEGRA-D 12 HOUR PO) Take by mouth as needed.        . gabapentin (NEURONTIN) 800 MG tablet Take 800 mg by mouth as directed. 400 mg in morning and 800 mg at bedtime      . hydrochlorothiazide (HYDRODIURIL) 25 MG tablet Take 1 tablet (25 mg total) by mouth daily. Taking 1/2 daily  30 tablet  11  . hydrocortisone (PROCTOSOL HC) 2.5 % rectal cream As directed  30 g  3  . levothyroxine (SYNTHROID, LEVOTHROID) 25 MCG tablet Take 1 tablet (25 mcg total) by mouth daily.  30 tablet  11  . metoprolol (TOPROL-XL) 100 MG 24 hr tablet 1/2 bid      . ramipril (ALTACE) 10 MG tablet Take 1 tablet (10 mg total) by mouth daily.  90 tablet  3  . warfarin (COUMADIN) 5 MG tablet 1 daily or as directed  90 tablet  3  . amLODipine (NORVASC) 2.5 MG tablet Take 1 tablet (2.5 mg total) by mouth daily.  30 tablet  11    Allergies  Allergen Reactions  . Amiodarone   . Crestor (Rosuvastatin Calcium)   . Lipitor (Atorvastatin Calcium)     Patient Active Problem List  Diagnoses  . Atrial fibrillation  . Chest discomfort  . Dizziness  . MVP (mitral  valve prolapse)  . DOE (dyspnea on exertion)  . Bruises easily  . Back pain  . Forgetfulness  . Atrial fibrillation  . Hypertension  . Hypothyroidism  . Aortic stenosis  . Hyperlipidemia  . Diverticulitis  . Interstitial nephritis  . Neuropathy  . Ischemic heart disease  . Malaise and fatigue  . Edema of foot  . Encounter for long-term (current) use of anticoagulants    History  Smoking status  . Never Smoker   Smokeless tobacco  . Not on file    History  Alcohol Use No    Family History  Problem Relation Age of Onset  . Cerebral aneurysm    . Cerebral aneurysm Father     Review of Systems: Constitutional: no fever chills diaphoresis or fatigue or change in weight.  Head and neck: no hearing loss, no epistaxis, no photophobia or visual disturbance. Respiratory: No cough, shortness of breath or wheezing. Cardiovascular: No chest pain peripheral edema, palpitations. Gastrointestinal: No abdominal distention, no abdominal pain, no change in bowel habits hematochezia or melena. Genitourinary: No dysuria, no frequency, no urgency, no nocturia. Musculoskeletal:No arthralgias, no back pain, no gait disturbance or myalgias.  Neurological: No dizziness, no headaches, no numbness, no seizures, no syncope, no weakness, no tremors. Hematologic: No lymphadenopathy, no easy bruising. Psychiatric: No confusion, no hallucinations, no sleep disturbance.    Physical Exam: Filed Vitals:   12/24/11 1049  BP: 176/104  Pulse: 78   repeat blood pressure is 150/96.  Weight is down 6 pounds and she does not appear to be fluid overloaded at this time.Pupils equal and reactive.   Extraocular Movements are full.  There is no scleral icterus.  The mouth and pharynx are normal.  The neck is supple.  The carotids reveal no bruits.  The jugular venous pressure is normal.  The thyroid is not enlarged.  There is no lymphadenopathy.  The chest is clear to percussion and auscultation. There are  no rales or rhonchi. Expansion of the chest is symmetrical.  Heart reveals a soft grade 2/6 murmur of mitral regurgitation and apex.The abdomen is soft and nontender. Bowel sounds are normal. The liver and spleen are not enlarged. There Are no abdominal masses. There are no bruits.  Extremities show no phlebitis or edema.  Pedal pulses are 1+.Strength is normal and symmetrical in all extremities.  There is no lateralizing weakness.  There are no sensory deficits.  The skin is warm and dry.  There is no rash.    Assessment / Plan: We will evaluate her complaint of dyspnea with a chest x-ray and we will also get lab work today including CBC TSH free T4 and basal metabolic panel.  For her poorly controlled high blood pressure we will start amlodipine 2.5 mg daily. She will recheck for a regular visit in 3 months

## 2011-12-24 NOTE — Patient Instructions (Signed)
Will obtain labs today and call you with the results Will have you go to get chest xray today at the [facility] building across from [facility] Amlodipine 2.5 mg daily, Rx [facility] [place] Your physician recommends that you schedule a follow-up appointment in: 3 months

## 2011-12-24 NOTE — Assessment & Plan Note (Signed)
Her blood pressure today is high.  I checked it myself and got 150/96.  When she arrived it was even higher than that.  Her pulse is not rapid and therefore we cannot push beta blockers and he higher at this point.  We will add back a small dose of amlodipine 2.5 mg daily.  On larger doses she developed edema but she may be able to tolerate a small dose.

## 2011-12-24 NOTE — Assessment & Plan Note (Signed)
The patient has not had any thromboembolic episodes.  Her pulse is irregular today consistent with atrial flutter fibrillation.  We did not do an EKG today.

## 2011-12-24 NOTE — Assessment & Plan Note (Signed)
The patient has a past history of mitral valve prolapse and mitral regurgitation.  We are going to check a chest x-ray today to evaluate her dyspnea.  Her last echocardiogram was 03/25/11 and showed an ejection fraction of 50-55% as well as biatrial enlargement and moderate mitral regurgitation.

## 2011-12-25 LAB — T4, FREE: Free T4: 1.11 ng/dL (ref 0.60–1.60)

## 2011-12-25 NOTE — Progress Notes (Signed)
Quick Note:  Please report to patient. The recent labs are stable. Continue same medication and careful diet. There is no anemia. The white count is normal. The thyroid function studies are normal. The kidney and liver tests are normal. No cause for her malaise and fatigue found. ______

## 2011-12-26 ENCOUNTER — Telehealth: Payer: Self-pay | Admitting: *Deleted

## 2011-12-26 NOTE — Telephone Encounter (Signed)
Message copied by Burnell Blanks on Fri Dec 26, 2011  3:54 PM ------      Message from: Cassell Clement      Created: Thu Dec 25, 2011  6:56 PM       Please report to patient.  The recent labs are stable. Continue same medication and careful diet.  There is no anemia.  The white count is normal.  The thyroid function studies are normal.  The kidney and liver tests are normal.  No cause for her malaise and fatigue found.

## 2011-12-26 NOTE — Telephone Encounter (Signed)
Advised of xray and labs Patient still complaining of decreased appetite and energy.  Advised to add multivitamin to current regimen.  Will forward to  Dr. Patty Sermons for review

## 2011-12-26 NOTE — Telephone Encounter (Signed)
Message copied by Burnell Blanks on Fri Dec 26, 2011  3:54 PM ------      Message from: Cassell Clement      Created: Wed Dec 24, 2011  8:58 PM       Chest xray was okay.  Pl report.

## 2011-12-28 NOTE — Telephone Encounter (Signed)
Agree with plan 

## 2012-01-09 ENCOUNTER — Other Ambulatory Visit: Payer: Medicare Other

## 2012-01-09 ENCOUNTER — Ambulatory Visit: Payer: Medicare Other | Admitting: Cardiology

## 2012-01-15 ENCOUNTER — Other Ambulatory Visit: Payer: Self-pay | Admitting: *Deleted

## 2012-01-15 DIAGNOSIS — F419 Anxiety disorder, unspecified: Secondary | ICD-10-CM

## 2012-01-17 MED ORDER — ALPRAZOLAM 0.25 MG PO TABS: 0.2500 mg | ORAL_TABLET | Freq: Every evening | ORAL | Status: DC | PRN

## 2012-02-04 ENCOUNTER — Ambulatory Visit (INDEPENDENT_AMBULATORY_CARE_PROVIDER_SITE_OTHER): Payer: Medicare Other | Admitting: *Deleted

## 2012-02-04 DIAGNOSIS — Z7901 Long term (current) use of anticoagulants: Secondary | ICD-10-CM | POA: Diagnosis not present

## 2012-02-04 DIAGNOSIS — I4891 Unspecified atrial fibrillation: Secondary | ICD-10-CM

## 2012-03-16 ENCOUNTER — Ambulatory Visit (INDEPENDENT_AMBULATORY_CARE_PROVIDER_SITE_OTHER): Payer: Medicare Other | Admitting: Cardiology

## 2012-03-16 ENCOUNTER — Ambulatory Visit (INDEPENDENT_AMBULATORY_CARE_PROVIDER_SITE_OTHER): Payer: Medicare Other

## 2012-03-16 ENCOUNTER — Encounter: Payer: Self-pay | Admitting: Cardiology

## 2012-03-16 VITALS — BP 109/71 | HR 76 | Ht 67.0 in | Wt 145.0 lb

## 2012-03-16 DIAGNOSIS — Z7901 Long term (current) use of anticoagulants: Secondary | ICD-10-CM | POA: Diagnosis not present

## 2012-03-16 DIAGNOSIS — I4891 Unspecified atrial fibrillation: Secondary | ICD-10-CM

## 2012-03-16 DIAGNOSIS — I059 Rheumatic mitral valve disease, unspecified: Secondary | ICD-10-CM | POA: Diagnosis not present

## 2012-03-16 DIAGNOSIS — I119 Hypertensive heart disease without heart failure: Secondary | ICD-10-CM | POA: Diagnosis not present

## 2012-03-16 DIAGNOSIS — R5381 Other malaise: Secondary | ICD-10-CM

## 2012-03-16 DIAGNOSIS — I341 Nonrheumatic mitral (valve) prolapse: Secondary | ICD-10-CM

## 2012-03-16 DIAGNOSIS — E039 Hypothyroidism, unspecified: Secondary | ICD-10-CM | POA: Diagnosis not present

## 2012-03-16 NOTE — Assessment & Plan Note (Signed)
She remains in atrial fibrillation with a controlled ventricular response.  She has not been having any TIA symptoms.  She is on long-term Coumadin.

## 2012-03-16 NOTE — Progress Notes (Signed)
Natasha Livingston Date of Birth:  Feb 22, 1934 [facility] [address] [place], [place]  [address] [phone]         Fax   [phone]  History of Present Illness: This pleasant 78 year old woman is seen for a followup office visit.  She has a history of established atrial fibrillation.  History of high blood pressure.  She does not have any history of recent chest pain or angina.  She has had remote CABG on 04/12/2007 by Dr. Maren Beach.  She has a past history of mitral regurgitation secondary to mitral valve prolapse.  She also has a history of hypothyroidism and a history of essential hypertension.  She complains of poor energy and she tires easily.  Current Outpatient Prescriptions  Medication Sig Dispense Refill  . ALPRAZolam (XANAX) 0.25 MG tablet Take 1 tablet (0.25 mg total) by mouth at bedtime as needed. 1/2 bid  30 tablet  5  . amLODipine (NORVASC) 2.5 MG tablet Take 1 tablet (2.5 mg total) by mouth daily.  30 tablet  11  . aspirin 81 MG tablet Take 81 mg by mouth daily.        Marland Kitchen Fexofenadine-Pseudoephedrine (ALLEGRA-D 12 HOUR PO) Take by mouth as needed.        . gabapentin (NEURONTIN) 800 MG tablet Take 800 mg by mouth as directed. 400 mg in morning and 800 mg at bedtime      . hydrochlorothiazide (HYDRODIURIL) 25 MG tablet Take 1 tablet (25 mg total) by mouth daily. Taking 1/2 daily  30 tablet  11  . hydrocortisone (PROCTOSOL HC) 2.5 % rectal cream As directed  30 g  3  . levothyroxine (SYNTHROID, LEVOTHROID) 25 MCG tablet Take 1 tablet (25 mcg total) by mouth daily.  30 tablet  11  . metoprolol (TOPROL-XL) 100 MG 24 hr tablet 1/2 bid      . ramipril (ALTACE) 10 MG tablet Take 1 tablet (10 mg total) by mouth daily.  90 tablet  3  . warfarin (COUMADIN) 5 MG tablet 1 daily or as directed  90 tablet  3    Allergies  Allergen Reactions  . Amiodarone   . Crestor (Rosuvastatin Calcium)   . Lipitor (Atorvastatin Calcium)     Patient Active Problem List    Diagnoses  . Atrial fibrillation  . Chest discomfort  . Dizziness  . MVP (mitral valve prolapse)  . DOE (dyspnea on exertion)  . Bruises easily  . Back pain  . Forgetfulness  . Atrial fibrillation  . Hypertension  . Hypothyroidism  . Aortic stenosis  . Hyperlipidemia  . Diverticulitis  . Interstitial nephritis  . Neuropathy  . Ischemic heart disease  . Malaise and fatigue  . Edema of foot  . Encounter for long-term (current) use of anticoagulants  . Benign hypertensive heart disease without heart failure    History  Smoking status  . Never Smoker   Smokeless tobacco  . Not on file    History  Alcohol Use No    Family History  Problem Relation Age of Onset  . Cerebral aneurysm    . Cerebral aneurysm Father     Review of Systems: Constitutional: no fever chills diaphoresis or fatigue or change in weight.  Head and neck: no hearing loss, no epistaxis, no photophobia or visual disturbance. Respiratory: No cough, shortness of breath or wheezing. Cardiovascular: No chest pain peripheral edema, palpitations. Gastrointestinal: No abdominal distention, no abdominal pain, no change in bowel habits hematochezia or  melena. Genitourinary: No dysuria, no frequency, no urgency, no nocturia. Musculoskeletal:No arthralgias, no back pain, no gait disturbance or myalgias. Neurological: No dizziness, no headaches, no numbness, no seizures, no syncope, no weakness, no tremors. Hematologic: No lymphadenopathy, no easy bruising. Psychiatric: No confusion, no hallucinations, no sleep disturbance.    Physical Exam: Filed Vitals:   03/16/12 1053  BP: 109/71  Pulse: 76   the general appearance reveals a well-developed well-nourished elderly woman in no distress.The head and neck exam reveals pupils equal and reactive.  Extraocular movements are full.  There is no scleral icterus.  The mouth and pharynx are normal.  The neck is supple.  The carotids reveal no bruits.  The jugular  venous pressure is normal.  The  thyroid is not enlarged.  There is no lymphadenopathy.  The chest is clear to percussion and auscultation.  There are no rales or rhonchi.  Expansion of the chest is symmetrical.  The pulse is irregular The precordium is quiet.  The first heart sound is normal.  The second heart sound is physiologically split.  There is no  gallop rub or click.  There is a soft apical systolic murmur of mitral regurgitation rate 2/6.  There is no abnormal lift or heave.  The abdomen is soft and nontender.  The bowel sounds are normal.  The liver and spleen are not enlarged.  There are no abdominal masses.  There are no abdominal bruits.  Extremities reveal good pedal pulses.  There is no phlebitis or edema.  There is no cyanosis or clubbing.  Strength is normal and symmetrical in all extremities.  There is no lateralizing weakness.  There are no sensory deficits.  The skin is warm and dry.  There is no rash.     Assessment / Plan: Continue same medication.  Check in 3 months for followup office visit EKG CBC fasting lipid panel hepatic function panel and basal metabolic panel and TSH.  I think your easy fatigue is probably multifactorial and related to her atrial fibrillation and her valvular heart disease and to some extent her age.

## 2012-03-16 NOTE — Assessment & Plan Note (Signed)
The patient complains of easy fatigue.  She has compensated hypothyroidism and her thyroid function studies last visit were satisfactory.  She has not been experiencing any hematochezia or melena.

## 2012-03-16 NOTE — Assessment & Plan Note (Signed)
The patient has not been having any symptoms of orthopnea or paroxysmal nocturnal dyspnea or excessive fluid retention

## 2012-03-16 NOTE — Patient Instructions (Signed)
Your physician recommends that you continue on your current medications as directed. Please refer to the Current Medication list given to you today.  .Your physician recommends that you schedule a follow-up appointment in: 3 months with fasting labs (LP/BMET/HFP/TSH/CBC)  

## 2012-04-20 ENCOUNTER — Ambulatory Visit (INDEPENDENT_AMBULATORY_CARE_PROVIDER_SITE_OTHER): Payer: Medicare Other | Admitting: Pharmacist

## 2012-04-20 DIAGNOSIS — I4891 Unspecified atrial fibrillation: Secondary | ICD-10-CM | POA: Diagnosis not present

## 2012-04-20 DIAGNOSIS — Z7901 Long term (current) use of anticoagulants: Secondary | ICD-10-CM | POA: Diagnosis not present

## 2012-04-20 LAB — POCT INR: INR: 2.7

## 2012-05-28 DIAGNOSIS — H16109 Unspecified superficial keratitis, unspecified eye: Secondary | ICD-10-CM | POA: Diagnosis not present

## 2012-05-28 DIAGNOSIS — H02839 Dermatochalasis of unspecified eye, unspecified eyelid: Secondary | ICD-10-CM | POA: Diagnosis not present

## 2012-05-28 DIAGNOSIS — H04129 Dry eye syndrome of unspecified lacrimal gland: Secondary | ICD-10-CM | POA: Diagnosis not present

## 2012-05-28 DIAGNOSIS — H259 Unspecified age-related cataract: Secondary | ICD-10-CM | POA: Diagnosis not present

## 2012-06-01 ENCOUNTER — Ambulatory Visit (INDEPENDENT_AMBULATORY_CARE_PROVIDER_SITE_OTHER): Payer: Medicare Other | Admitting: *Deleted

## 2012-06-01 DIAGNOSIS — I4891 Unspecified atrial fibrillation: Secondary | ICD-10-CM | POA: Diagnosis not present

## 2012-06-01 DIAGNOSIS — Z7901 Long term (current) use of anticoagulants: Secondary | ICD-10-CM | POA: Diagnosis not present

## 2012-06-01 LAB — POCT INR: INR: 3.9

## 2012-06-14 ENCOUNTER — Other Ambulatory Visit: Payer: Self-pay | Admitting: Cardiology

## 2012-06-22 ENCOUNTER — Other Ambulatory Visit (INDEPENDENT_AMBULATORY_CARE_PROVIDER_SITE_OTHER): Payer: Medicare Other

## 2012-06-22 ENCOUNTER — Encounter: Payer: Self-pay | Admitting: Cardiology

## 2012-06-22 ENCOUNTER — Ambulatory Visit
Admission: RE | Admit: 2012-06-22 | Discharge: 2012-06-22 | Disposition: A | Discharge status: Home / Self Care | Payer: Medicare Other | Source: Ambulatory Visit | Attending: Cardiology | Admitting: Cardiology

## 2012-06-22 ENCOUNTER — Ambulatory Visit (INDEPENDENT_AMBULATORY_CARE_PROVIDER_SITE_OTHER): Payer: Medicare Other

## 2012-06-22 ENCOUNTER — Ambulatory Visit (INDEPENDENT_AMBULATORY_CARE_PROVIDER_SITE_OTHER): Payer: Medicare Other | Admitting: Cardiology

## 2012-06-22 VITALS — BP 140/78 | HR 83 | Resp 19 | Ht 67.0 in | Wt 136.0 lb

## 2012-06-22 DIAGNOSIS — I4891 Unspecified atrial fibrillation: Secondary | ICD-10-CM | POA: Diagnosis not present

## 2012-06-22 DIAGNOSIS — R0989 Other specified symptoms and signs involving the circulatory and respiratory systems: Secondary | ICD-10-CM

## 2012-06-22 DIAGNOSIS — R0609 Other forms of dyspnea: Secondary | ICD-10-CM | POA: Diagnosis not present

## 2012-06-22 DIAGNOSIS — I059 Rheumatic mitral valve disease, unspecified: Secondary | ICD-10-CM

## 2012-06-22 DIAGNOSIS — I341 Nonrheumatic mitral (valve) prolapse: Secondary | ICD-10-CM

## 2012-06-22 DIAGNOSIS — Z7901 Long term (current) use of anticoagulants: Secondary | ICD-10-CM | POA: Diagnosis not present

## 2012-06-22 DIAGNOSIS — I517 Cardiomegaly: Secondary | ICD-10-CM | POA: Diagnosis not present

## 2012-06-22 DIAGNOSIS — I340 Nonrheumatic mitral (valve) insufficiency: Secondary | ICD-10-CM

## 2012-06-22 DIAGNOSIS — E039 Hypothyroidism, unspecified: Secondary | ICD-10-CM

## 2012-06-22 DIAGNOSIS — R0602 Shortness of breath: Secondary | ICD-10-CM | POA: Diagnosis not present

## 2012-06-22 DIAGNOSIS — I119 Hypertensive heart disease without heart failure: Secondary | ICD-10-CM | POA: Diagnosis not present

## 2012-06-22 LAB — CBC WITH DIFFERENTIAL/PLATELET
Basophils Absolute: 0 10*3/uL (ref 0.0–0.1)
Eosinophils Absolute: 0.1 10*3/uL (ref 0.0–0.7)
HCT: 44.8 % (ref 36.0–46.0)
Hemoglobin: 14.7 g/dL (ref 12.0–15.0)
Lymphs Abs: 1.2 10*3/uL (ref 0.7–4.0)
MCHC: 32.8 g/dL (ref 30.0–36.0)
Monocytes Absolute: 0.7 10*3/uL (ref 0.1–1.0)
Neutro Abs: 4.4 10*3/uL (ref 1.4–7.7)
Platelets: 205 10*3/uL (ref 150.0–400.0)
RDW: 14.6 % (ref 11.5–14.6)

## 2012-06-22 LAB — BASIC METABOLIC PANEL
CO2: 30 mEq/L (ref 19–32)
Chloride: 103 mEq/L (ref 96–112)
Potassium: 3.5 mEq/L (ref 3.5–5.1)

## 2012-06-22 LAB — LIPID PANEL
Cholesterol: 196 mg/dL (ref 0–200)
LDL Cholesterol: 125 mg/dL — ABNORMAL HIGH (ref 0–99)
Total CHOL/HDL Ratio: 5

## 2012-06-22 LAB — HEPATIC FUNCTION PANEL
ALT: 22 U/L (ref 0–35)
AST: 26 U/L (ref 0–37)
Alkaline Phosphatase: 95 U/L (ref 39–117)
Bilirubin, Direct: 0.2 mg/dL (ref 0.0–0.3)
Total Bilirubin: 1.3 mg/dL — ABNORMAL HIGH (ref 0.3–1.2)
Total Protein: 7.3 g/dL (ref 6.0–8.3)

## 2012-06-22 LAB — TSH: TSH: 1.81 u[IU]/mL (ref 0.35–5.50)

## 2012-06-22 NOTE — Patient Instructions (Signed)
Your physician recommends that you continue on your current medications as directed. Please refer to the Current Medication list given to you today.   Your physician recommends that you schedule a follow-up appointment in: 4 months with fasting labs (lp/bmet/hfp/cbc)  Your physician has requested that you have an echocardiogram. Echocardiography is a painless test that uses sound waves to create images of your heart. It provides your doctor with information about the size and shape of your heart and how well your heart's chambers and valves are working. This procedure takes approximately one hour. There are no restrictions for this procedure.   Will have you go for chest xray and call with the results  Will obtain labs today and call you with the results

## 2012-06-22 NOTE — Assessment & Plan Note (Signed)
She is clinically euthyroid and we are checking a TSH today

## 2012-06-22 NOTE — Progress Notes (Signed)
Natasha Livingston Date of Birth:  Feb 28, 1934 [facility] [address] [place], [place]  [address] [phone]         Fax   [phone]  History of Present Illness: This pleasant 78 year old woman is seen for a followup office visit. She has a history of established atrial fibrillation. History of high blood pressure. She does not have any history of recent chest pain or angina. She has had remote CABG on 04/12/2007 by Dr. Maren Beach. She has a past history of mitral regurgitation secondary to mitral valve prolapse. She also has a history of hypothyroidism and a history of essential hypertension. She complains of poor energy and she tires easily. Since last visit she has stayed very short of breath.  She is not having any chest pain.  She sleeps on one pillow and does not have paroxysmal nocturnal dyspnea.  She has not had any peripheral edema and her weight is down 9 pounds. Her most recent echocardiogram of 03/25/11 showed an ejection fraction of 50-55% and moderate mitral regurgitation. Her last chest x-ray of 12/24/11 showed a normal heart size. Her oxygen saturation today on room air is 87%  Current Outpatient Prescriptions  Medication Sig Dispense Refill  . ALPRAZolam (XANAX) 0.25 MG tablet Take 1 tablet (0.25 mg total) by mouth at bedtime as needed. 1/2 bid  30 tablet  5  . amLODipine (NORVASC) 2.5 MG tablet Take 1 tablet (2.5 mg total) by mouth daily.  30 tablet  11  . aspirin 81 MG tablet Take 81 mg by mouth daily.        Marland Kitchen Fexofenadine-Pseudoephedrine (ALLEGRA-D 12 HOUR PO) Take by mouth as needed.        . gabapentin (NEURONTIN) 800 MG tablet Take 800 mg by mouth as directed. 400 mg in morning and 800 mg at bedtime      . hydrochlorothiazide (HYDRODIURIL) 25 MG tablet Take 1 tablet (25 mg total) by mouth daily. Taking 1/2 daily  30 tablet  11  . hydrocortisone (PROCTOSOL HC) 2.5 % rectal cream As directed  30 g  3  . levothyroxine (SYNTHROID, LEVOTHROID) 25 MCG  tablet TAKE 1 TABLET BY MOUTH EVERY DAY  30 tablet  8  . metoprolol (TOPROL-XL) 100 MG 24 hr tablet 1/2 bid      . ramipril (ALTACE) 10 MG tablet Take 1 tablet (10 mg total) by mouth daily.  90 tablet  3  . warfarin (COUMADIN) 5 MG tablet 1 daily or as directed  90 tablet  3    Allergies  Allergen Reactions  . Amiodarone   . Crestor (Rosuvastatin Calcium)   . Lipitor (Atorvastatin Calcium)     Patient Active Problem List  Diagnosis  . Atrial fibrillation  . Chest discomfort  . Dizziness  . MVP (mitral valve prolapse)  . DOE (dyspnea on exertion)  . Bruises easily  . Back pain  . Forgetfulness  . Atrial fibrillation  . Hypertension  . Hypothyroidism  . Aortic stenosis  . Hyperlipidemia  . Diverticulitis  . Interstitial nephritis  . Neuropathy  . Ischemic heart disease  . Malaise and fatigue  . Edema of foot  . Encounter for long-term (current) use of anticoagulants  . Benign hypertensive heart disease without heart failure    History  Smoking status  . Never Smoker   Smokeless tobacco  . Not on file    History  Alcohol Use No    Family History  Problem Relation Age of Onset  .  Cerebral aneurysm    . Cerebral aneurysm Father     Review of Systems: Constitutional: no fever chills diaphoresis or fatigue or change in weight.  Head and neck: no hearing loss, no epistaxis, no photophobia or visual disturbance. Respiratory: No cough, shortness of breath or wheezing. Cardiovascular: No chest pain peripheral edema, palpitations. Gastrointestinal: No abdominal distention, no abdominal pain, no change in bowel habits hematochezia or melena. Genitourinary: No dysuria, no frequency, no urgency, no nocturia. Musculoskeletal:No arthralgias, no back pain, no gait disturbance or myalgias. Neurological: No dizziness, no headaches, no numbness, no seizures, no syncope, no weakness, no tremors. Hematologic: No lymphadenopathy, no easy bruising. Psychiatric: No confusion,  no hallucinations, no sleep disturbance.    Physical Exam: Filed Vitals:   06/22/12 1019  BP: 140/78  Pulse: 83  Resp: 19   the general appearance reveals a well-developed well-nourished woman in no acute distress.The head and neck exam reveals pupils equal and reactive.  Extraocular movements are full.  There is no scleral icterus.  The mouth and pharynx are normal.  The neck is supple.  The carotids reveal no bruits.  The jugular venous pressure is normal.  The  thyroid is not enlarged.  There is no lymphadenopathy.  The chest is clear to percussion and auscultation.  There are no rales or rhonchi.  Expansion of the chest is symmetrical.  The precordium is quiet.  The first heart sound is normal.  The second heart sound is physiologically split.  There is no gallop rub or click.  There is a grade 2/6 apical systolic murmur.  No pericardial rub. There is no abnormal lift or heave.  The abdomen is soft and nontender.  The bowel sounds are normal.  The liver and spleen are not enlarged.  There are no abdominal masses.  There are no abdominal bruits.  Extremities reveal good pedal pulses.  There is no phlebitis or edema.  There is no cyanosis or clubbing.  Strength is normal and symmetrical in all extremities.  There is no lateralizing weakness.  There are no sensory deficits.  The skin is warm and dry.  There is no rash.  EKG today shows atrial fibrillation with occasional PVCs.  The ventricular rate is adequately controlled at 69 per minute.  She has widespread inferior and lateral T-wave inversion unchanged from 09/29/11  Assessment / Plan: Continue same medication for now.  Blood work today is pending.  We will send her for a chest x-ray and a 2-D echo.  Recheck in 4 months for followup office visit fasting lab work and CBC.

## 2012-06-22 NOTE — Assessment & Plan Note (Signed)
We are going to repeat her echocardiogram to look for other causes of her dyspnea such as worsening mitral regurgitation.  Also consider possible pericardial constrictive disease.

## 2012-06-22 NOTE — Assessment & Plan Note (Signed)
EKG shows atrial fibrillation with a controlled ventricular response.  She remains on long-term Coumadin

## 2012-06-25 ENCOUNTER — Other Ambulatory Visit: Payer: Self-pay | Admitting: Cardiology

## 2012-06-25 NOTE — Telephone Encounter (Signed)
Refilled proctosol 

## 2012-06-28 ENCOUNTER — Telehealth: Payer: Self-pay | Admitting: Cardiology

## 2012-06-28 DIAGNOSIS — E876 Hypokalemia: Secondary | ICD-10-CM

## 2012-06-28 MED ORDER — POTASSIUM CHLORIDE CRYS ER 20 MEQ PO TBCR: 20.0000 meq | EXTENDED_RELEASE_TABLET | Freq: Every day | ORAL | Status: DC

## 2012-06-28 NOTE — Telephone Encounter (Signed)
Advised patient and scheduled follow up labs on 07/13/12

## 2012-06-28 NOTE — Telephone Encounter (Signed)
Message copied by Burnell Blanks on Mon Jun 28, 2012  1:44 PM ------      Message from: Cassell Clement      Created: Tue Jun 22, 2012  9:20 PM       Please report. Thyroid okay.  CBC okay. Liver okay.. Kidney function okay.  LDL cholesterol 125 too high. Potassium is low normal and may be contributing to her weakness. Add KDUR 20 meq daily

## 2012-06-28 NOTE — Telephone Encounter (Signed)
PT CALLING RE RESULTS OF TEST AND BLOOD WORK HAD DONE LAST WEEK, PLS CALL [phone]

## 2012-06-28 NOTE — Telephone Encounter (Signed)
Message copied by Burnell Blanks on Mon Jun 28, 2012  1:50 PM ------      Message from: Cassell Clement      Created: Tue Jun 22, 2012  9:21 PM       Please report.  Chest xray normal for her. No change since last time.

## 2012-07-13 ENCOUNTER — Ambulatory Visit (HOSPITAL_COMMUNITY): Payer: Medicare Other | Attending: Cardiology

## 2012-07-13 ENCOUNTER — Other Ambulatory Visit (INDEPENDENT_AMBULATORY_CARE_PROVIDER_SITE_OTHER): Payer: Medicare Other

## 2012-07-13 ENCOUNTER — Ambulatory Visit (INDEPENDENT_AMBULATORY_CARE_PROVIDER_SITE_OTHER): Payer: Medicare Other | Admitting: *Deleted

## 2012-07-13 DIAGNOSIS — I4891 Unspecified atrial fibrillation: Secondary | ICD-10-CM

## 2012-07-13 DIAGNOSIS — E876 Hypokalemia: Secondary | ICD-10-CM | POA: Diagnosis not present

## 2012-07-13 DIAGNOSIS — I10 Essential (primary) hypertension: Secondary | ICD-10-CM | POA: Insufficient documentation

## 2012-07-13 DIAGNOSIS — I369 Nonrheumatic tricuspid valve disorder, unspecified: Secondary | ICD-10-CM | POA: Diagnosis not present

## 2012-07-13 DIAGNOSIS — I379 Nonrheumatic pulmonary valve disorder, unspecified: Secondary | ICD-10-CM | POA: Diagnosis not present

## 2012-07-13 DIAGNOSIS — I2589 Other forms of chronic ischemic heart disease: Secondary | ICD-10-CM | POA: Diagnosis not present

## 2012-07-13 DIAGNOSIS — I059 Rheumatic mitral valve disease, unspecified: Secondary | ICD-10-CM | POA: Insufficient documentation

## 2012-07-13 DIAGNOSIS — Z7901 Long term (current) use of anticoagulants: Secondary | ICD-10-CM | POA: Diagnosis not present

## 2012-07-13 LAB — BASIC METABOLIC PANEL
BUN: 22 mg/dL (ref 6–23)
Calcium: 9.4 mg/dL (ref 8.4–10.5)
Creatinine, Ser: 0.8 mg/dL (ref 0.4–1.2)
GFR: 75.12 mL/min (ref >60.00)

## 2012-07-13 LAB — POCT INR: INR: 1.8

## 2012-07-13 NOTE — Progress Notes (Signed)
Echocardiogram performed.  

## 2012-07-25 NOTE — Progress Notes (Signed)
Quick Note:  Please report to patient. The recent labs are stable. Continue same medication and careful diet. ______ 

## 2012-07-26 ENCOUNTER — Telehealth: Payer: Self-pay | Admitting: Cardiology

## 2012-07-26 MED ORDER — CARVEDILOL 12.5 MG PO TABS: 12.5000 mg | ORAL_TABLET | Freq: Two times a day (BID) | ORAL | Status: DC

## 2012-07-26 NOTE — Telephone Encounter (Signed)
Pt requesting results of echo, cxr and blood work

## 2012-07-26 NOTE — Telephone Encounter (Signed)
Advised patient and called Rx to pharmacy. Did write down patient instructions to be give to her at coumadin check tomorrow

## 2012-07-26 NOTE — Telephone Encounter (Signed)
Message copied by Burnell Blanks on Mon Jul 26, 2012  4:13 PM ------      Message from: Cassell Clement      Created: Sun Jul 25, 2012  4:46 PM       Please report to patient.  The recent labs are stable. Continue same medication and careful diet.

## 2012-07-26 NOTE — Telephone Encounter (Signed)
Message copied by Burnell Blanks on Mon Jul 26, 2012  4:05 PM ------      Message from: Cassell Clement      Created: Sun Jul 25, 2012  5:02 PM       Please report.  The mitral regurgitation is less than on 03/25/11 but the LV EF has decreased from 50-55% to 40%.  This could account for some of her exertional dyspnea. For her lower EF I want to stop metoprolol and switch to carvedilol 12.5 mg tablets taking 1/2 tab BID for the first week then increase to one whole tab BID.

## 2012-07-26 NOTE — Telephone Encounter (Signed)
Left message to call back  

## 2012-07-26 NOTE — Telephone Encounter (Signed)
F/u   Patient returning nurse call she can be reached at hm#

## 2012-07-27 ENCOUNTER — Ambulatory Visit (INDEPENDENT_AMBULATORY_CARE_PROVIDER_SITE_OTHER): Payer: Medicare Other | Admitting: *Deleted

## 2012-07-27 ENCOUNTER — Other Ambulatory Visit: Payer: Self-pay | Admitting: Cardiology

## 2012-07-27 DIAGNOSIS — F419 Anxiety disorder, unspecified: Secondary | ICD-10-CM

## 2012-07-27 DIAGNOSIS — Z7901 Long term (current) use of anticoagulants: Secondary | ICD-10-CM | POA: Diagnosis not present

## 2012-07-27 DIAGNOSIS — I4891 Unspecified atrial fibrillation: Secondary | ICD-10-CM

## 2012-07-27 LAB — POCT INR: INR: 2.3

## 2012-07-27 MED ORDER — ALPRAZOLAM 0.25 MG PO TABS: ORAL_TABLET | ORAL | Status: DC

## 2012-07-27 NOTE — Telephone Encounter (Signed)
RefillHoratio Livingston   Preferred pharmacy [name]

## 2012-08-04 DIAGNOSIS — Z23 Encounter for immunization: Secondary | ICD-10-CM | POA: Diagnosis not present

## 2012-08-17 ENCOUNTER — Ambulatory Visit (INDEPENDENT_AMBULATORY_CARE_PROVIDER_SITE_OTHER): Payer: Medicare Other

## 2012-08-17 DIAGNOSIS — Z7901 Long term (current) use of anticoagulants: Secondary | ICD-10-CM

## 2012-08-17 DIAGNOSIS — I4891 Unspecified atrial fibrillation: Secondary | ICD-10-CM | POA: Diagnosis not present

## 2012-08-17 LAB — POCT INR: INR: 2.6

## 2012-08-26 ENCOUNTER — Telehealth: Payer: Self-pay | Admitting: Cardiology

## 2012-08-26 DIAGNOSIS — I119 Hypertensive heart disease without heart failure: Secondary | ICD-10-CM

## 2012-08-26 DIAGNOSIS — I4891 Unspecified atrial fibrillation: Secondary | ICD-10-CM

## 2012-08-26 MED ORDER — WARFARIN SODIUM 5 MG PO TABS: ORAL_TABLET | ORAL | Status: DC

## 2012-08-26 MED ORDER — CARVEDILOL 12.5 MG PO TABS: 12.5000 mg | ORAL_TABLET | Freq: Two times a day (BID) | ORAL | Status: DC

## 2012-08-26 MED ORDER — RAMIPRIL 10 MG PO TABS: 10.0000 mg | ORAL_TABLET | Freq: Every day | ORAL | Status: DC

## 2012-08-26 NOTE — Telephone Encounter (Signed)
plz return call to pt at hm# regarding Ramipril RX

## 2012-08-26 NOTE — Telephone Encounter (Signed)
Mailed Rx's to patient as requested

## 2012-09-13 DIAGNOSIS — R82998 Other abnormal findings in urine: Secondary | ICD-10-CM | POA: Diagnosis not present

## 2012-09-13 DIAGNOSIS — R339 Retention of urine, unspecified: Secondary | ICD-10-CM | POA: Diagnosis not present

## 2012-09-14 ENCOUNTER — Ambulatory Visit (INDEPENDENT_AMBULATORY_CARE_PROVIDER_SITE_OTHER): Payer: Medicare Other | Admitting: *Deleted

## 2012-09-14 DIAGNOSIS — I4891 Unspecified atrial fibrillation: Secondary | ICD-10-CM | POA: Diagnosis not present

## 2012-09-14 DIAGNOSIS — Z7901 Long term (current) use of anticoagulants: Secondary | ICD-10-CM | POA: Diagnosis not present

## 2012-09-14 LAB — POCT INR: INR: 2.2

## 2012-09-29 ENCOUNTER — Other Ambulatory Visit: Payer: Self-pay

## 2012-09-29 MED ORDER — CARVEDILOL 12.5 MG PO TABS: 12.5000 mg | ORAL_TABLET | Freq: Two times a day (BID) | ORAL | Status: DC

## 2012-10-12 ENCOUNTER — Telehealth: Payer: Self-pay | Admitting: Cardiology

## 2012-10-12 NOTE — Telephone Encounter (Signed)
Scheduled appointment for tomorrow. Swelling and shortness of breath

## 2012-10-12 NOTE — Telephone Encounter (Signed)
New problem:  C/o swelling in foot - left. Offer an appt on tomorrow with PA refuse.

## 2012-10-13 ENCOUNTER — Ambulatory Visit: Payer: Medicare Other | Admitting: Cardiology

## 2012-10-19 ENCOUNTER — Ambulatory Visit: Payer: Medicare Other | Admitting: Cardiology

## 2012-10-19 ENCOUNTER — Other Ambulatory Visit: Payer: Medicare Other

## 2012-10-19 ENCOUNTER — Ambulatory Visit (INDEPENDENT_AMBULATORY_CARE_PROVIDER_SITE_OTHER): Payer: Medicare Other | Admitting: *Deleted

## 2012-10-19 ENCOUNTER — Telehealth: Payer: Self-pay | Admitting: Cardiology

## 2012-10-19 ENCOUNTER — Ambulatory Visit (INDEPENDENT_AMBULATORY_CARE_PROVIDER_SITE_OTHER): Payer: Medicare Other | Admitting: Cardiology

## 2012-10-19 VITALS — BP 150/80 | HR 74 | Ht 67.0 in | Wt 152.0 lb

## 2012-10-19 DIAGNOSIS — R609 Edema, unspecified: Secondary | ICD-10-CM

## 2012-10-19 DIAGNOSIS — I4891 Unspecified atrial fibrillation: Secondary | ICD-10-CM | POA: Diagnosis not present

## 2012-10-19 DIAGNOSIS — Z7901 Long term (current) use of anticoagulants: Secondary | ICD-10-CM

## 2012-10-19 DIAGNOSIS — R600 Localized edema: Secondary | ICD-10-CM

## 2012-10-19 DIAGNOSIS — I059 Rheumatic mitral valve disease, unspecified: Secondary | ICD-10-CM

## 2012-10-19 DIAGNOSIS — I119 Hypertensive heart disease without heart failure: Secondary | ICD-10-CM

## 2012-10-19 DIAGNOSIS — I341 Nonrheumatic mitral (valve) prolapse: Secondary | ICD-10-CM

## 2012-10-19 DIAGNOSIS — I259 Chronic ischemic heart disease, unspecified: Secondary | ICD-10-CM | POA: Diagnosis not present

## 2012-10-19 LAB — CBC WITH DIFFERENTIAL/PLATELET
Basophils Absolute: 0 10*3/uL (ref 0.0–0.1)
Eosinophils Absolute: 0.1 10*3/uL (ref 0.0–0.7)
HCT: 40.4 % (ref 36.0–46.0)
Lymphocytes Relative: 15.7 % (ref 12.0–46.0)
Lymphs Abs: 1 10*3/uL (ref 0.7–4.0)
MCHC: 32.8 g/dL (ref 30.0–36.0)
Monocytes Relative: 9.6 % (ref 3.0–12.0)
Platelets: 183 10*3/uL (ref 150.0–400.0)
RDW: 14.8 % — ABNORMAL HIGH (ref 11.5–14.6)

## 2012-10-19 LAB — HEPATIC FUNCTION PANEL
Alkaline Phosphatase: 106 U/L (ref 39–117)
Bilirubin, Direct: 0.3 mg/dL (ref 0.0–0.3)
Total Bilirubin: 1.9 mg/dL — ABNORMAL HIGH (ref 0.3–1.2)
Total Protein: 7.4 g/dL (ref 6.0–8.3)

## 2012-10-19 LAB — BASIC METABOLIC PANEL
CO2: 28 mEq/L (ref 19–32)
Calcium: 9.5 mg/dL (ref 8.4–10.5)
Creatinine, Ser: 0.8 mg/dL (ref 0.4–1.2)
Sodium: 138 mEq/L (ref 135–145)

## 2012-10-19 LAB — LIPID PANEL
Cholesterol: 184 mg/dL (ref 0–200)
HDL: 40.1 mg/dL (ref >39.00)
LDL Cholesterol: 121 mg/dL — ABNORMAL HIGH (ref 0–99)
Total CHOL/HDL Ratio: 5
Triglycerides: 113 mg/dL (ref 0.0–149.0)

## 2012-10-19 MED ORDER — FUROSEMIDE 20 MG PO TABS: 20.0000 mg | ORAL_TABLET | Freq: Every day | ORAL | Status: DC

## 2012-10-19 NOTE — Telephone Encounter (Signed)
New problem:   Has some question regarding medication .

## 2012-10-19 NOTE — Assessment & Plan Note (Signed)
The patient remains in chronic atrial fibrillation with a controlled ventricular response.  Pulse today is 74.  She is on long-term Coumadin.  She has had no thromboembolic episodes

## 2012-10-19 NOTE — Progress Notes (Signed)
Natasha Livingston Date of Birth:  [birth year] [facility] [address] [place], [place]  [address] [phone]         Fax   [phone]  History of Present Illness: This pleasant 78 year old woman is seen for a followup office visit. She has a history of established atrial fibrillation. History of high blood pressure. She does not have any history of recent chest pain or angina. She has had remote CABG on 04/12/2007 by Dr. Maren Beach. She has a past history of mitral regurgitation secondary to mitral valve prolapse. She also has a history of hypothyroidism and a history of essential hypertension. She complains of poor energy and she tires easily.  Her last echocardiogram on 07/13/12 showed an ejection fraction of 40% and mild mitral regurgitation.  She is now on carvedilol.  We started her out on 6.25 mg twice a day and she is now taking 12.5 mg twice a day.  In retrospect she thinks that she may have felt better on the lower dose.   Current Outpatient Prescriptions  Medication Sig Dispense Refill  . ALPRAZolam (XANAX) 0.25 MG tablet 1/2 bid  30 tablet  5  . amLODipine (NORVASC) 2.5 MG tablet Take 1 tablet (2.5 mg total) by mouth daily.  30 tablet  11  . aspirin 81 MG tablet Take 81 mg by mouth daily.        . carvedilol (COREG) 12.5 MG tablet Take 1 tablet (12.5 mg total) by mouth 2 (two) times daily with a meal. Start 1/2 twice a day x 1 week and then increase to 1 twice daily Starting on 07/27/12  60 tablet  0  . Fexofenadine-Pseudoephedrine (ALLEGRA-D 12 HOUR PO) Take by mouth as needed.        . gabapentin (NEURONTIN) 800 MG tablet 400 mg in morning and 800 mg at bedtime      . levothyroxine (SYNTHROID, LEVOTHROID) 25 MCG tablet TAKE 1 TABLET BY MOUTH EVERY DAY  30 tablet  8  . potassium chloride SA (K-DUR,KLOR-CON) 20 MEQ tablet Take 1 tablet (20 mEq total) by mouth daily.  30 tablet  5  . PROCTOSOL HC 2.5 % rectal cream USE AS DIRECTED  28 g  2  . ramipril (ALTACE)  10 MG tablet Take 1 tablet (10 mg total) by mouth daily.  90 tablet  3  . warfarin (COUMADIN) 5 MG tablet 1 daily or as directed  90 tablet  3  . furosemide (LASIX) 20 MG tablet Take 1 tablet (20 mg total) by mouth daily.  30 tablet  5    Allergies  Allergen Reactions  . Amiodarone   . Crestor (Rosuvastatin Calcium)   . Lipitor (Atorvastatin Calcium)     Patient Active Problem List  Diagnosis  . Atrial fibrillation  . Chest discomfort  . Dizziness  . MVP (mitral valve prolapse)  . DOE (dyspnea on exertion)  . Bruises easily  . Back pain  . Forgetfulness  . Atrial fibrillation  . Hypertension  . Hypothyroidism  . Aortic stenosis  . Hyperlipidemia  . Diverticulitis  . Interstitial nephritis  . Neuropathy  . Ischemic heart disease  . Malaise and fatigue  . Edema of foot  . Encounter for long-term (current) use of anticoagulants  . Benign hypertensive heart disease without heart failure    History  Smoking status  . Never Smoker   Smokeless tobacco  . Not on file    History  Alcohol Use No  Family History  Problem Relation Age of Onset  . Cerebral aneurysm    . Cerebral aneurysm Father     Review of Systems: Constitutional: no fever chills diaphoresis or fatigue or change in weight.  Head and neck: no hearing loss, no epistaxis, no photophobia or visual disturbance. Respiratory: No cough, shortness of breath or wheezing. Cardiovascular: No chest pain peripheral edema, palpitations. Gastrointestinal: No abdominal distention, no abdominal pain, no change in bowel habits hematochezia or melena. Genitourinary: No dysuria, no frequency, no urgency, no nocturia. Musculoskeletal:No arthralgias, no back pain, no gait disturbance or myalgias. Neurological: No dizziness, no headaches, no numbness, no seizures, no syncope, no weakness, no tremors. Hematologic: No lymphadenopathy, no easy bruising. Psychiatric: No confusion, no hallucinations, no sleep  disturbance.    Physical Exam: Filed Vitals:   10/19/12 0943  BP: 150/80  Pulse: 74   the general appearance reveals a well-developed well-nourished woman in no distress.The head and neck exam reveals pupils equal and reactive.  Extraocular movements are full.  There is no scleral icterus.  The mouth and pharynx are normal.  The neck is supple.  The carotids reveal no bruits.  The jugular venous pressure is normal.  The  thyroid is not enlarged.  There is no lymphadenopathy.  The chest is clear to percussion and auscultation.  There are no rales or rhonchi.  Expansion of the chest is symmetrical.  The precordium is quiet.  The first heart sound is normal.  The second heart sound is physiologically split.  There is a soft systolic murmur at the left sternal edge.  There is no abnormal lift or heave.  The abdomen is soft and nontender.  The bowel sounds are normal.  The liver and spleen are not enlarged.  There are no abdominal masses.  There are no abdominal bruits.  Extremities reveal good pedal pulses.  There is no phlebitis or edema.  There is no cyanosis or clubbing.  Strength is normal and symmetrical in all extremities.  There is no lateralizing weakness.  There are no sensory deficits.  The skin is warm and dry.  There is no rash.   EKG shows fibrillation with atrial ventricular response and ST-T wave abnormalities in inferolateral leads similar to prior tracings.  Assessment / Plan: Continue same medication except change in diuretic to Lasix.  We will see how her labs are today and then also consider dropping her back to her previous dose of carvedilol.  She was asking about an exercise program and we will consider cardiac rehabilitation if she was still qualify.  He has been 5 years since her bypass.  Right now she is too busy looking after her sick husband to participate in rehabilitation.  Recheck in 4 months for followup office visit and basal metabolic panel.

## 2012-10-19 NOTE — Assessment & Plan Note (Signed)
The patient is not having any paroxysmal nocturnal dyspnea.  She does have mild peripheral edema and we will switch her from hydrochlorothiazide to furosemide

## 2012-10-19 NOTE — Telephone Encounter (Signed)
Has been off potassium for over a week, will forward to  Dr. Patty Sermons so he will know when he reviews labs

## 2012-10-19 NOTE — Progress Notes (Signed)
Quick Note:  Please report to patient. The recent labs are stable. Continue same medication and careful diet. Okay to go down to the lower dose of carvedilol since she felt better on it. ______

## 2012-10-19 NOTE — Patient Instructions (Addendum)
STOP HYDROCHLOROTHIAZIDE AND START LASIX 20 MG DAILY  Your physician wants you to follow-up in: 4 MONTH OV/BMETYou will receive a reminder letter in the mail two months in advance. If you don't receive a letter, please call our office to schedule the follow-up appointment.   Will obtain labs today and call you with the results (LP/BMET/HFP/CBC)

## 2012-10-19 NOTE — Assessment & Plan Note (Signed)
The patient continues to have problems with peripheral edema.  She is on a very low dose of amlodipine to help with blood pressure control.  He may need a stronger diuretic.  We are stopping her hydrochlorothiazide and switching to furosemide 20 mg daily.

## 2012-10-20 ENCOUNTER — Telehealth: Payer: Self-pay | Admitting: *Deleted

## 2012-10-20 DIAGNOSIS — E876 Hypokalemia: Secondary | ICD-10-CM

## 2012-10-20 MED ORDER — POTASSIUM CHLORIDE CRYS ER 20 MEQ PO TBCR: 20.0000 meq | EXTENDED_RELEASE_TABLET | Freq: Every day | ORAL | Status: DC

## 2012-10-20 NOTE — Telephone Encounter (Signed)
Message copied by Burnell Blanks on Wed Oct 20, 2012  2:08 PM ------      Message from: Cassell Clement      Created: Tue Oct 19, 2012  9:41 PM       Please report to patient.  The recent labs are stable. Continue same medication and careful diet. Okay to go down to the lower dose of carvedilol since she felt better on it.

## 2012-10-20 NOTE — Telephone Encounter (Signed)
The potassium was 3.8 which is at the lower side of normal so she should go back on her daily potassium once a day

## 2012-10-20 NOTE — Telephone Encounter (Signed)
Advised patient of labs and medication changes (decrease carvedilol to 1/2 tablet twice a day and starting potassium back daily)

## 2012-11-02 ENCOUNTER — Telehealth: Payer: Self-pay | Admitting: Cardiology

## 2012-11-02 NOTE — Telephone Encounter (Signed)
Okay to refill gabapentin

## 2012-11-02 NOTE — Telephone Encounter (Signed)
Spoke with pt. She is requesting written prescription for 90 day supply of gabapentin mailed to her. She states she can wait until Dr. Patty Sermons is back in office on November 10, 2012 for prescription to be sent.

## 2012-11-02 NOTE — Telephone Encounter (Signed)
New PRoblem:    Paient called in needing a new prescription for her gabapentin (NEURONTIN) 800 MG tablet written out and mailed to her home address.  Please call back.

## 2012-11-08 MED ORDER — GABAPENTIN 800 MG PO TABS: ORAL_TABLET | ORAL | Status: DC

## 2012-11-08 NOTE — Telephone Encounter (Signed)
Spoke with patient and will mail her Rx as requested

## 2012-11-30 ENCOUNTER — Ambulatory Visit (INDEPENDENT_AMBULATORY_CARE_PROVIDER_SITE_OTHER): Payer: Medicare Other | Admitting: *Deleted

## 2012-11-30 DIAGNOSIS — I4891 Unspecified atrial fibrillation: Secondary | ICD-10-CM

## 2012-11-30 DIAGNOSIS — Z7901 Long term (current) use of anticoagulants: Secondary | ICD-10-CM | POA: Diagnosis not present

## 2012-11-30 LAB — POCT INR: INR: 2.4

## 2012-12-13 ENCOUNTER — Telehealth: Payer: Self-pay | Admitting: Cardiology

## 2012-12-13 NOTE — Telephone Encounter (Signed)
She can leave off her Lasix if she has not already taken it today and leaving off in the morning before she comes for her visit.

## 2012-12-13 NOTE — Telephone Encounter (Signed)
Advised patient

## 2012-12-13 NOTE — Telephone Encounter (Signed)
Legs started itching 2 to 3 weeks ago and this am started on back and arms.  Is concerned it is coming from the Furosemide.   Also having a lot of fatigue. Did start after changing to Carvedilol. Having increased shortness of breath also, this is not anything new.  Did have to use NTG on Saturday.  Scheduled an appointment for her to see Lawson Fiscal tomorrow if son can bring her. She wanted for me to get recommendation from  Dr. Patty Sermons on her meds for today.  Will forward to  Dr. Patty Sermons for review

## 2012-12-13 NOTE — Telephone Encounter (Signed)
New Problem:    Patient called in because she is itching all over and has broken out in hives and believes it is due to her furosemide (LASIX) 20 MG tablet.  Please call back.

## 2012-12-13 NOTE — Telephone Encounter (Signed)
New Problem     Pt states she is itching all over and believes it is coming from her medication furosemide (LASIX) 20 MG tablet. Please call back.

## 2012-12-14 ENCOUNTER — Encounter: Payer: Self-pay | Admitting: Nurse Practitioner

## 2012-12-14 ENCOUNTER — Telehealth: Payer: Self-pay | Admitting: *Deleted

## 2012-12-14 ENCOUNTER — Ambulatory Visit (INDEPENDENT_AMBULATORY_CARE_PROVIDER_SITE_OTHER): Payer: Medicare Other | Admitting: Nurse Practitioner

## 2012-12-14 VITALS — BP 148/82 | HR 94 | Ht 67.0 in | Wt 154.8 lb

## 2012-12-14 DIAGNOSIS — R0600 Dyspnea, unspecified: Secondary | ICD-10-CM

## 2012-12-14 DIAGNOSIS — R0609 Other forms of dyspnea: Secondary | ICD-10-CM | POA: Diagnosis not present

## 2012-12-14 DIAGNOSIS — R0989 Other specified symptoms and signs involving the circulatory and respiratory systems: Secondary | ICD-10-CM | POA: Diagnosis not present

## 2012-12-14 DIAGNOSIS — R079 Chest pain, unspecified: Secondary | ICD-10-CM

## 2012-12-14 LAB — CBC
HCT: 38 % (ref 36.0–46.0)
Hemoglobin: 12.8 g/dL (ref 12.0–15.0)
MCHC: 33.6 g/dL (ref 30.0–36.0)
MCV: 92.4 fl (ref 78.0–100.0)
Platelets: 158 10*3/uL (ref 150.0–400.0)
RBC: 4.12 Mil/uL (ref 3.87–5.11)
RDW: 14.9 % — ABNORMAL HIGH (ref 11.5–14.6)
WBC: 6.1 10*3/uL (ref 4.5–10.5)

## 2012-12-14 LAB — BASIC METABOLIC PANEL
BUN: 16 mg/dL (ref 6–23)
CO2: 28 mEq/L (ref 19–32)
Calcium: 9.6 mg/dL (ref 8.4–10.5)
Chloride: 103 mEq/L (ref 96–112)
Creatinine, Ser: 1 mg/dL (ref 0.4–1.2)
GFR: 58.52 mL/min — ABNORMAL LOW (ref >60.00)
Glucose, Bld: 120 mg/dL — ABNORMAL HIGH (ref 70–99)
Potassium: 3.3 mEq/L — ABNORMAL LOW (ref 3.5–5.1)
Sodium: 138 mEq/L (ref 135–145)

## 2012-12-14 LAB — BRAIN NATRIURETIC PEPTIDE: Pro B Natriuretic peptide (BNP): 332 pg/mL — ABNORMAL HIGH (ref 0.0–100.0)

## 2012-12-14 MED ORDER — METHYLPREDNISOLONE 4 MG PO KIT: PACK | ORAL | Status: DC

## 2012-12-14 NOTE — Telephone Encounter (Signed)
Advised patient of labs and medication changes 

## 2012-12-14 NOTE — Progress Notes (Signed)
Natasha Livingston Date of Birth: 1934/06/15 Medical Record #[id number]  History of Present Illness: [name] is seen today for a work in visit. She is seen for Dr. Patty Sermons. She has multiple issues which include chronic atrial fib, HTN, remote CABG in 2008, MR due to MVP, hypothyroidism and chronic fatigue. She was last here in December. Switched from HCTZ to Lasix. EF down to 40% per echo back in September. Managed medically.   She called yesterday with a multitude of complaints - rash, itching, fatigue, shortness of breath, using NTG, etc.   She comes in today. She is here with her son, Natasha Livingston. Her primary complaint is that of itching & rash. Says it has been going on for about 2 weeks. Its all over. She denied changing detergents over the phone with [facility] yesterday but tells me she did change 2 weeks ago. She is not taking any new medicines over this past 2 weeks. She has continued to take both the HCTZ and her Lasix. She does not remember being told to stop the HCTZ. Says she has had some chest pain off and on but does not relay any NTG use to me today. Had told Juliette Alcide that she took 4 NTG recently. Details unknown. I had to ask her about a fall. She first said she did not remember. Details are sketchy. She had on her husband's shoes and may have gotten tripped up. She really does not remember. She is on coumadin. She remains fatigued. She is pretty sedentary. Her son says she has been this way basically since her bypass surgery in 2008. She is short of breath. Hard to say if it is worse. Has with and without exertion. Weight is up just 2 pounds.   Current Outpatient Prescriptions on File Prior to Visit  Medication Sig Dispense Refill  . ALPRAZolam (XANAX) 0.25 MG tablet 1/2 bid  30 tablet  5  . aspirin 81 MG tablet Take 81 mg by mouth daily.        . carvedilol (COREG) 12.5 MG tablet Take 12.5 mg by mouth 2 (two) times daily with a meal. Start 1/2 twice a day      . furosemide (LASIX) 20  MG tablet Take 1 tablet (20 mg total) by mouth daily.  30 tablet  5  . gabapentin (NEURONTIN) 800 MG tablet 1/2 tablet in the morning and 1 tablet in the evening  135 tablet  3  . levothyroxine (SYNTHROID, LEVOTHROID) 25 MCG tablet TAKE 1 TABLET BY MOUTH EVERY DAY  30 tablet  8  . potassium chloride SA (K-DUR,KLOR-CON) 20 MEQ tablet Take 1 tablet (20 mEq total) by mouth daily.  30 tablet  5  . PROCTOSOL HC 2.5 % rectal cream USE AS DIRECTED  28 g  2  . ramipril (ALTACE) 10 MG tablet Take 1 tablet (10 mg total) by mouth daily.  90 tablet  3  . warfarin (COUMADIN) 5 MG tablet 1 daily or as directed  90 tablet  3   No current facility-administered medications on file prior to visit.    Allergies  Allergen Reactions  . Amiodarone   . Crestor (Rosuvastatin Calcium)   . Lipitor (Atorvastatin Calcium)     Past Medical History  Diagnosis Date  . Chest discomfort   . Dizziness   . MVP (mitral valve prolapse)   . DOE (dyspnea on exertion)   . Bruises easily   . Back pain   . Forgetfulness   . Atrial fibrillation   .  Hypertension   . Hypothyroidism   . Aortic stenosis   . Hyperlipidemia   . Diverticulitis   . Interstitial nephritis   . Neuropathy   . Ischemic heart disease     Past Surgical History  Procedure Laterality Date  . Coronary artery bypass graft  04/2007  . Cardioversion  08/2008  . Ovarian cyst removal      History  Smoking status  . Never Smoker   Smokeless tobacco  . Not on file    History  Alcohol Use No    Family History  Problem Relation Age of Onset  . Cerebral aneurysm    . Cerebral aneurysm Father     Review of Systems: The review of systems is per the HPI.  All other systems were reviewed and are negative.  Physical Exam: BP 148/82  Pulse 94  Ht 5\' 7"  (1.702 m)  Wt 154 lb 12.8 oz (70.217 kg)  BMI 24.24 kg/m2  SpO2 95% Patient is very pleasant and in no acute distress. She is a very poor historian. Skin is warm and dry. Color is normal.   HEENT is unremarkable. Normocephalic/atraumatic. PERRL. Sclera are nonicteric. Neck is supple. No masses. No JVD. Lungs are fairly clear. Cardiac exam shows an irregular rhythm. Her rate is controlled. Abdomen is soft. Extremities are without edema today. Gait and ROM are intact. No gross neurologic deficits noted. She has a diffuse red spotty rash over her entire body that looks allergic to me.    LABORATORY DATA: EKG today shows atrial fib with fairly well controlled VR. She has inferolateral ST depression which is unchanged.   Labs are pending.   Assessment / Plan:  1. Rash/itching - I suspect this is due to the change in detergent. I have suggested she switch back and rewash her clothes. Will also give her a steroid dose pak to take as directed. I do not think this is drug related at this time.   2. Fatigue - chronic  3. CAD - remote CABG - has a multitude of complaints - will arrange for Myoview  4. Fall/?syncope - she is not able to provide details - would consider monitor if recurs.   5. Dyspnea - hard to say if she is worse or not. Will check BNP today along with other labs. EF is 40% by last echo. Update her Myoview. I have left her on her Lasix but have stopped the HCTZ  I will have Dr. Patty Sermons see her back in about 2 weeks. She probably needs more help at home with medicines and generalized oversight. Will check the labs today. Update her Myoview. Treatment of her rash with steroids.   Patient and her sone are agreeable to this plan and will call if any problems develop in the interim.

## 2012-12-14 NOTE — Patient Instructions (Addendum)
We are going to do a stress test (Lexiscan)  Stop the HCTZ  Continue with your other medicines for now  I am going to give you a steroid dose pak - follow the instructions on the box. This is at the drug store.  We will need to check your Coumadin on Friday  We need to check labs today  Stop using the detergent and rewash your clothes - I think this is the reason for your rash

## 2012-12-14 NOTE — Telephone Encounter (Signed)
Message copied by Burnell Blanks on Tue Dec 14, 2012  6:07 PM ------      Message from: Rosalio Macadamia      Created: Tue Dec 14, 2012  4:30 PM       Please call. Increase the Lasix to 40 mg for 3 days only and then cut back to 20 mg            Increase her potassium to 2 tabs each day            Recheck BMET on her return visit. ------

## 2012-12-15 ENCOUNTER — Ambulatory Visit: Payer: Medicare Other | Admitting: Cardiology

## 2012-12-20 NOTE — Telephone Encounter (Signed)
Pt called because she said has not been able to sleep during the night. Pt has been taken Medrol dosepack. She has finished taken  the medication yesterday. Pt was reassure the the symptoms will go away . It will take a few days for the medication to go out of her system. Pt verbalized understanding.

## 2012-12-20 NOTE — Telephone Encounter (Signed)
New problem   C/o side effect from new  Medication

## 2012-12-22 ENCOUNTER — Ambulatory Visit (HOSPITAL_COMMUNITY): Payer: Medicare Other | Attending: Cardiology | Admitting: Radiology

## 2012-12-22 ENCOUNTER — Ambulatory Visit (INDEPENDENT_AMBULATORY_CARE_PROVIDER_SITE_OTHER): Payer: Medicare Other | Admitting: *Deleted

## 2012-12-22 VITALS — BP 143/105 | HR 89 | Ht 67.0 in | Wt 146.0 lb

## 2012-12-22 DIAGNOSIS — I2510 Atherosclerotic heart disease of native coronary artery without angina pectoris: Secondary | ICD-10-CM | POA: Insufficient documentation

## 2012-12-22 DIAGNOSIS — R000 Tachycardia, unspecified: Secondary | ICD-10-CM | POA: Insufficient documentation

## 2012-12-22 DIAGNOSIS — R0600 Dyspnea, unspecified: Secondary | ICD-10-CM

## 2012-12-22 DIAGNOSIS — I4949 Other premature depolarization: Secondary | ICD-10-CM | POA: Diagnosis not present

## 2012-12-22 DIAGNOSIS — I4891 Unspecified atrial fibrillation: Secondary | ICD-10-CM | POA: Diagnosis not present

## 2012-12-22 DIAGNOSIS — R0602 Shortness of breath: Secondary | ICD-10-CM | POA: Insufficient documentation

## 2012-12-22 DIAGNOSIS — R0789 Other chest pain: Secondary | ICD-10-CM | POA: Insufficient documentation

## 2012-12-22 DIAGNOSIS — R5381 Other malaise: Secondary | ICD-10-CM | POA: Insufficient documentation

## 2012-12-22 DIAGNOSIS — I10 Essential (primary) hypertension: Secondary | ICD-10-CM | POA: Insufficient documentation

## 2012-12-22 DIAGNOSIS — R51 Headache: Secondary | ICD-10-CM | POA: Insufficient documentation

## 2012-12-22 DIAGNOSIS — E785 Hyperlipidemia, unspecified: Secondary | ICD-10-CM | POA: Diagnosis not present

## 2012-12-22 DIAGNOSIS — R079 Chest pain, unspecified: Secondary | ICD-10-CM

## 2012-12-22 DIAGNOSIS — R0609 Other forms of dyspnea: Secondary | ICD-10-CM | POA: Diagnosis not present

## 2012-12-22 DIAGNOSIS — Z7901 Long term (current) use of anticoagulants: Secondary | ICD-10-CM | POA: Diagnosis not present

## 2012-12-22 DIAGNOSIS — R42 Dizziness and giddiness: Secondary | ICD-10-CM | POA: Diagnosis not present

## 2012-12-22 DIAGNOSIS — R5383 Other fatigue: Secondary | ICD-10-CM | POA: Insufficient documentation

## 2012-12-22 DIAGNOSIS — R0989 Other specified symptoms and signs involving the circulatory and respiratory systems: Secondary | ICD-10-CM | POA: Insufficient documentation

## 2012-12-22 LAB — POCT INR: INR: 2.4

## 2012-12-22 MED ORDER — REGADENOSON 0.4 MG/5ML IV SOLN
0.4000 mg | Freq: Once | INTRAVENOUS | Status: AC
  Administered 2012-12-22: 0.4 mg via INTRAVENOUS

## 2012-12-22 MED ORDER — AMINOPHYLLINE 25 MG/ML IV SOLN
75.0000 mg | Freq: Once | INTRAVENOUS | Status: AC
  Administered 2012-12-22: 75 mg via INTRAVENOUS

## 2012-12-22 MED ORDER — TECHNETIUM TC 99M SESTAMIBI GENERIC - CARDIOLITE
10.8000 | Freq: Once | INTRAVENOUS | Status: AC | PRN
  Administered 2012-12-22: 11 via INTRAVENOUS

## 2012-12-22 MED ORDER — TECHNETIUM TC 99M SESTAMIBI GENERIC - CARDIOLITE
33.0000 | Freq: Once | INTRAVENOUS | Status: AC | PRN
  Administered 2012-12-22: 33 via INTRAVENOUS

## 2012-12-22 NOTE — Progress Notes (Signed)
[facility] SITE 3 NUCLEAR MED [address] [place], [place] [address] [phone]    Cardiology Nuclear Med Study  Natasha Livingston is a 79 y.o. female     MRN : [id number]     DOB: Nov 09, 1933  Procedure Date: 12/22/2012  Nuclear Med Background Indication for Stress Test:  Evaluation for Ischemia and Graft Patency History:  '08 [id number] ischemia>Cath>CABG, EF=50%; '09 Cardioversion for atrial flutter; h/o chronic atrial fibrillation; 9/13 Echo:EF=40%, severe TR, mild MR/PR Cardiac Risk Factors: Family History - CAD, Hypertension and Lipids  Symptoms:  Dizziness, DOE, Fatigue, Rapid HR and SOB   Nuclear Pre-Procedure Caffeine/Decaff Intake:  None > 12 hrs NPO After: 5:00pm yesterday   Lungs:  Clear. O2 Sat: 97% on room air. IV 0.9% NS with Angio Cath:  22g  IV Site: R Hand x 1, tolerated well IV Started by:  Irean Hong, RN  Chest Size (in):  36 Cup Size: C  Height: 5\' 7"  (1.702 m)  Weight:  146 lb (66.225 kg)  BMI:  Body mass index is 22.86 kg/(m^2). Tech Comments:  Patient states she took all am med's.    Nuclear Med Study 1 or 2 day study: 1 day  Stress Test Type:  Lexiscan  Reading MD: Olga Millers, MD  Order Authorizing Provider:  Cassell Clement, MD, and Norma Fredrickson, NP  Resting Radionuclide: Technetium 59m Sestamibi  Resting Radionuclide Dose: 10.8 mCi   Stress Radionuclide:  Technetium 64m Sestamibi  Stress Radionuclide Dose: 33.0 mCi           Stress Protocol Rest HR: 89 Stress HR: 88  Rest BP: 143/105 Stress BP: 140/88  Exercise Time (min): n/a METS: n/a   Predicted Max HR: 144 bpm % Max HR: 61.11 bpm Rate Pressure Product: 28413   Dose of Adenosine (mg):  n/a Dose of Lexiscan: 0.4 mg Dose of Aminophylline:75 mg  Dose of Atropine (mg): n/a Dose of Dobutamine: n/a mcg/kg/min (at max HR)  Stress Test Technologist: Smiley Houseman, CMA-N  Nuclear Technologist:  Domenic Polite, CNMT     Rest Procedure:  Myocardial perfusion  imaging was performed at rest 45 minutes following the intravenous administration of Technetium 38m Sestamibi.  Rest ECG: Atrial fibrillation with PVC, RAD, septal and lateral infarct; inferolateral T wave changes.  Stress Procedure:  The patient received IV Lexiscan 0.4 mg over 15-seconds.  Technetium 80m Sestamibi injected at 30-seconds.  She c/o chest tightness and a headache from the [facility], that was relieved with Aminophylline 75 mg IV.  Quantitative spect images were obtained after a 45 minute delay.  Stress ECG: Uninterpretable due to baseline changes.  QPS Raw Data Images:  Acquisition technically good; normal left ventricular size. Stress Images:  There is decreased uptake in the lateral wall. Rest Images:  There is decreased uptake in the lateral wall. Subtraction (SDS):  There is a fixed defect that is most consistent with a previous infarction. Transient Ischemic Dilatation (Normal <1.22):  1.14 Lung/Heart Ratio (Normal <0.45):  0.32  Quantitative Gated Spect Images QGS EDV:  n/a QGS ESV:  n/a  Impression Exercise Capacity:  Lexiscan with no exercise. BP Response:  Normal blood pressure response. Clinical Symptoms:  There is chest pain and dyspnea. ECG Impression:  Uninterpretable due to baseline changes. Comparison with Prior Nuclear Study: No images to compare  Overall Impression:  Low risk stress nuclear study with a small, severe, fixed lateral defect consistent with prior infarct; no ischemia.  LV Ejection Fraction: Study not gated.  LV Wall Motion:  Study not gated   [facility]

## 2012-12-27 ENCOUNTER — Telehealth: Payer: Self-pay | Admitting: Cardiology

## 2012-12-27 NOTE — Telephone Encounter (Signed)
Message copied by Burnell Blanks on Mon Dec 27, 2012  2:02 PM ------      Message from: Cassell Clement      Created: Thu Dec 23, 2012  8:32 PM       Please report.  Small old scar but no ischemia. CSD. Discuss further at OV. ------

## 2012-12-27 NOTE — Telephone Encounter (Signed)
I think that is what we need to do. I do not have anything else to offer.

## 2012-12-27 NOTE — Telephone Encounter (Signed)
Pt was in last week and had a rash, lori told her it was her laundry detergent, went back to old laundry detergent about 1 month, still has rash, worse on back, pls advise [phone]

## 2012-12-27 NOTE — Telephone Encounter (Signed)
Advised patient of stress test results.  Patient still has the rash that she had when she saw Dawayne Patricia NP. Patient states it did get a little better when she first started steroid pack but is back to really itching now.  Patient does see dermatologist Dr Margo Aye. Advised to call him and get an appointment and keep ov with  Dr. Patty Sermons on Friday. Will forward to [facility] NP to see if any other recommendations.

## 2012-12-27 NOTE — Telephone Encounter (Signed)
Advised patient

## 2012-12-29 DIAGNOSIS — L570 Actinic keratosis: Secondary | ICD-10-CM | POA: Diagnosis not present

## 2012-12-29 DIAGNOSIS — B86 Scabies: Secondary | ICD-10-CM | POA: Diagnosis not present

## 2012-12-30 ENCOUNTER — Ambulatory Visit: Payer: Medicare Other | Admitting: Cardiology

## 2012-12-31 ENCOUNTER — Ambulatory Visit (INDEPENDENT_AMBULATORY_CARE_PROVIDER_SITE_OTHER): Payer: Medicare Other | Admitting: Cardiology

## 2012-12-31 ENCOUNTER — Encounter: Payer: Self-pay | Admitting: Cardiology

## 2012-12-31 VITALS — BP 150/102 | HR 55 | Ht 67.0 in | Wt 149.8 lb

## 2012-12-31 DIAGNOSIS — I4891 Unspecified atrial fibrillation: Secondary | ICD-10-CM | POA: Diagnosis not present

## 2012-12-31 DIAGNOSIS — I259 Chronic ischemic heart disease, unspecified: Secondary | ICD-10-CM

## 2012-12-31 DIAGNOSIS — I119 Hypertensive heart disease without heart failure: Secondary | ICD-10-CM | POA: Diagnosis not present

## 2012-12-31 DIAGNOSIS — L309 Dermatitis, unspecified: Secondary | ICD-10-CM | POA: Insufficient documentation

## 2012-12-31 DIAGNOSIS — L259 Unspecified contact dermatitis, unspecified cause: Secondary | ICD-10-CM | POA: Diagnosis not present

## 2012-12-31 MED ORDER — METOPROLOL SUCCINATE ER 25 MG PO TB24: 25.0000 mg | ORAL_TABLET | Freq: Every day | ORAL | Status: DC

## 2012-12-31 NOTE — Assessment & Plan Note (Signed)
Blood pressure was elevated today.  On repeat her blood pressure is 140/90 by me. Her skin rash did not improve when she left off her hydrochlorothiazide.  Her skin rash may be secondary to her ACE inhibitor ramipril and we will leave off the ramipril at this time.  She will continue to take furosemide for diuretic.  At the present time she is not fluid overloaded, has no peripheral edema, and her weight is down 3 pounds.

## 2012-12-31 NOTE — Assessment & Plan Note (Signed)
The patient is an established atrial flutter fibrillation.  Today she has a rapid ventricular response despite the fact that she is on carvedilol.  We are going to and Toprol XL 25 mg one daily to give additional heart rate slowing.  Her blood pressure is elevated today and the Toprol will also help with that.

## 2012-12-31 NOTE — Progress Notes (Signed)
Natasha Livingston Date of Birth:  December 12, 1933 [facility] [address] [place], [place]  [address] [phone]         Fax   [phone]  History of Present Illness:  This pleasant 79 year old woman is seen for a followup office visit. She has a history of established atrial fibrillation. History of high blood pressure. She does have a history of atypical chest pain and had a Lexus scan Myoview stress test on 12/22/2012 which showed no evidence of ischemia and showed a small scar on the lateral wall.  The study was not gated because of her atrial fibrillation. She has had remote CABG on 04/12/2007 by Dr. Maren Beach. She has a past history of mitral regurgitation secondary to mitral valve prolapse. She also has a history of hypothyroidism and a history of essential hypertension. She complains of poor energy and she tires easily.  Her last echocardiogram on 07/13/12 showed an ejection fraction of 40% and mild mitral regurgitation. She is now on carvedilol.  Presently she is on a dose of carvedilol 12.5 mg twice a day.  For the past month or so she has been having a terrible problem with a generalized skin rash.  She did see her dermatologist to diagnosed questionable dust mites and gave her a cream to put on but so far there has been no improvement in the rash is generalized.  Current Outpatient Prescriptions  Medication Sig Dispense Refill  . ALPRAZolam (XANAX) 0.25 MG tablet 1/2 bid  30 tablet  5  . aspirin 81 MG tablet Take 81 mg by mouth daily.        . carvedilol (COREG) 12.5 MG tablet Take 12.5 mg by mouth 2 (two) times daily with a meal. Start 1/2 twice a day      . furosemide (LASIX) 20 MG tablet Take 1 tablet (20 mg total) by mouth daily.  30 tablet  5  . gabapentin (NEURONTIN) 800 MG tablet 1/2 tablet in the morning and 1 tablet in the evening  135 tablet  3  . levothyroxine (SYNTHROID, LEVOTHROID) 25 MCG tablet TAKE 1 TABLET BY MOUTH EVERY DAY  30 tablet  8  .  methylPREDNISolone (MEDROL DOSEPAK) 4 MG tablet follow package directions  21 tablet  0  . potassium chloride SA (K-DUR,KLOR-CON) 20 MEQ tablet Take 20 mEq by mouth 2 (two) times daily.      Marland Kitchen PROCTOSOL HC 2.5 % rectal cream USE AS DIRECTED  28 g  2  . propranolol (INDERAL) 10 MG tablet Take 10 mg by mouth every 8 (eight) hours as needed.      . warfarin (COUMADIN) 5 MG tablet 1 daily or as directed  90 tablet  3  . metoprolol succinate (TOPROL XL) 25 MG 24 hr tablet Take 1 tablet (25 mg total) by mouth daily.  30 tablet  6   No current facility-administered medications for this visit.    Allergies  Allergen Reactions  . Amiodarone   . Crestor (Rosuvastatin Calcium)   . Lipitor (Atorvastatin Calcium)     Patient Active Problem List  Diagnosis  . Atrial fibrillation  . Chest discomfort  . Dizziness  . MVP (mitral valve prolapse)  . DOE (dyspnea on exertion)  . Bruises easily  . Back pain  . Forgetfulness  . Atrial fibrillation  . Hypertension  . Hypothyroidism  . Aortic stenosis  . Hyperlipidemia  . Diverticulitis  . Interstitial nephritis  . Neuropathy  . Ischemic heart disease  .  Malaise and fatigue  . Edema of foot  . Encounter for long-term (current) use of anticoagulants  . Benign hypertensive heart disease without heart failure    History  Smoking status  . Never Smoker   Smokeless tobacco  . Not on file    History  Alcohol Use No    Family History  Problem Relation Age of Onset  . Cerebral aneurysm    . Cerebral aneurysm Father     Review of Systems: Constitutional: no fever chills diaphoresis or fatigue or change in weight.  Head and neck: no hearing loss, no epistaxis, no photophobia or visual disturbance. Respiratory: No cough, shortness of breath or wheezing. Cardiovascular: No chest pain peripheral edema, palpitations. Gastrointestinal: No abdominal distention, no abdominal pain, no change in bowel habits hematochezia or  melena. Genitourinary: No dysuria, no frequency, no urgency, no nocturia. Musculoskeletal:No arthralgias, no back pain, no gait disturbance or myalgias. Neurological: No dizziness, no headaches, no numbness, no seizures, no syncope, no weakness, no tremors. Hematologic: No lymphadenopathy, no easy bruising. Psychiatric: No confusion, no hallucinations, no sleep disturbance.    Physical Exam: Filed Vitals:   12/31/12 1146  BP: 150/102  Pulse: 55   the general appearance reveals a well-developed well-nourished woman in no distress.  Repeat blood pressure by me sitting using right arm was 140/90.The head and neck exam reveals pupils equal and reactive.  Extraocular movements are full.  There is no scleral icterus.  The mouth and pharynx are normal.  The neck is supple.  The carotids reveal no bruits.  The jugular venous pressure is normal.  The  thyroid is not enlarged.  There is no lymphadenopathy.  The chest is clear to percussion and auscultation.  There are no rales or rhonchi.  Expansion of the chest is symmetrical.  The precordium is quiet.  The first heart sound is normal.  The second heart sound is physiologically split.  There is a grade 2/6 apical systolic murmur.  There is no abnormal lift or heave.  The abdomen is soft and nontender.  The bowel sounds are normal.  The liver and spleen are not enlarged.  There are no abdominal masses.  There are no abdominal bruits.  Extremities reveal good pedal pulses.  There is no phlebitis or edema.  There is no cyanosis or clubbing.  Strength is normal and symmetrical in all extremities.  There is no lateralizing weakness.  There are no sensory deficits.  The skin is warm and dry.  There is a generalized macular papular rash especially on the trunk and to a lesser extent the arms and legs.  It has spared the face.     Assessment / Plan:  Continue same medication except stop her Monopril and add Toprol and continue carvedilol also.  Recheck in 2-3  weeks for followup office visit and EKG.  Consider adding digoxin for additional rate control if heart rate remains elevated.  Her son was with her today and he is going to see to it that she uses a pill box system at home to keep her medications straight.

## 2012-12-31 NOTE — Assessment & Plan Note (Signed)
We will keep her off both the hydrochlorothiazide and the ACE inhibitor over the next 2-3 weeks and see if her rash subsides.  She will also stay in close contact with her dermatologist.

## 2012-12-31 NOTE — Patient Instructions (Addendum)
Stop Ramipril ( Altace )   Continue Coreg 12.5 mg twice a day   Start Toprol XL 25 mg daily   Your physician recommends that you schedule a follow-up appointment in: 2 to 3 weeks

## 2012-12-31 NOTE — Assessment & Plan Note (Signed)
No further chest pain and her recent Lexa scan Myoview on 12/22/12 showed no ischemia.

## 2013-01-14 ENCOUNTER — Encounter: Payer: Self-pay | Admitting: Cardiology

## 2013-01-14 ENCOUNTER — Ambulatory Visit (INDEPENDENT_AMBULATORY_CARE_PROVIDER_SITE_OTHER): Payer: Medicare Other | Admitting: Cardiology

## 2013-01-14 VITALS — BP 164/112 | HR 84 | Ht 67.0 in | Wt 150.4 lb

## 2013-01-14 DIAGNOSIS — I341 Nonrheumatic mitral (valve) prolapse: Secondary | ICD-10-CM

## 2013-01-14 DIAGNOSIS — I059 Rheumatic mitral valve disease, unspecified: Secondary | ICD-10-CM

## 2013-01-14 DIAGNOSIS — R0609 Other forms of dyspnea: Secondary | ICD-10-CM | POA: Diagnosis not present

## 2013-01-14 DIAGNOSIS — E785 Hyperlipidemia, unspecified: Secondary | ICD-10-CM | POA: Diagnosis not present

## 2013-01-14 DIAGNOSIS — R0989 Other specified symptoms and signs involving the circulatory and respiratory systems: Secondary | ICD-10-CM

## 2013-01-14 DIAGNOSIS — I119 Hypertensive heart disease without heart failure: Secondary | ICD-10-CM | POA: Diagnosis not present

## 2013-01-14 LAB — CBC WITH DIFFERENTIAL/PLATELET
Basophils Absolute: 0 10*3/uL (ref 0.0–0.1)
Eosinophils Absolute: 0.1 10*3/uL (ref 0.0–0.7)
Lymphocytes Relative: 12.5 % (ref 12.0–46.0)
MCHC: 33 g/dL (ref 30.0–36.0)
MCV: 92.3 fl (ref 78.0–100.0)
Monocytes Absolute: 0.9 10*3/uL (ref 0.1–1.0)
Neutro Abs: 6.2 10*3/uL (ref 1.4–7.7)
Neutrophils Relative %: 74.5 % (ref 43.0–77.0)
RDW: 15.7 % — ABNORMAL HIGH (ref 11.5–14.6)

## 2013-01-14 LAB — BASIC METABOLIC PANEL
BUN: 19 mg/dL (ref 6–23)
Chloride: 102 mEq/L (ref 96–112)
Creatinine, Ser: 1 mg/dL (ref 0.4–1.2)
GFR: 56.5 mL/min — ABNORMAL LOW (ref >60.00)
Potassium: 3.5 mEq/L (ref 3.5–5.1)

## 2013-01-14 LAB — HEPATIC FUNCTION PANEL
ALT: 23 U/L (ref 0–35)
Bilirubin, Direct: 0.4 mg/dL — ABNORMAL HIGH (ref 0.0–0.3)
Total Bilirubin: 1.6 mg/dL — ABNORMAL HIGH (ref 0.3–1.2)

## 2013-01-14 MED ORDER — FUROSEMIDE 40 MG PO TABS: 40.0000 mg | ORAL_TABLET | Freq: Every day | ORAL | Status: DC

## 2013-01-14 MED ORDER — LISINOPRIL 10 MG PO TABS: 10.0000 mg | ORAL_TABLET | Freq: Every day | ORAL | Status: DC

## 2013-01-14 MED ORDER — CARVEDILOL 25 MG PO TABS: 25.0000 mg | ORAL_TABLET | Freq: Two times a day (BID) | ORAL | Status: DC

## 2013-01-14 NOTE — Assessment & Plan Note (Signed)
Patient has known mitral valve prolapse.  She has not been experiencing any recent chest pain or angina.

## 2013-01-14 NOTE — Assessment & Plan Note (Signed)
Patient has a past history of hyperlipidemia.  Currently she is not on any statin therapy.  She is intolerant to statins. Her liver function studies were checked several months ago and were satisfactory.  Her color is somewhat sallow today and we are checking lab work today including CBC hepatic function panel and basal metabolic panel.

## 2013-01-14 NOTE — Patient Instructions (Addendum)
Will have you go for a chest xray at the [facility] building across from [facility] obtain labs today and call you with the results (HFP/BMET/CBC)  START LISINOPRIL 10 MG DAILY  INCREASE LASIX (FUROSEMIDE) TO 40 MG DAILY   INCREASE YOUR CARVEDILOL TO 25 MG TWICE A DAY (DOUBLE UP 12.5 MG TABLETS UNTIL FINISHED, WHEN YOU GET YOUR NEW RX YOU WILL GET THE 25 MG TABLETS AND JUST TAKE 1 TWICE A DAY)  Your physician recommends that you schedule a follow-up appointment in: 1 MONTH OV/BMET/EKG

## 2013-01-14 NOTE — Progress Notes (Signed)
Quick Note:  Please report to patient. The recent labs are stable. Continue same medication and careful diet. Several of the liver function studies are slightly elevated. We sometimes see this in gallbladder disease. Has she ever had any history of gallbladder disease? If she having any problems with indigestion or right upper quadrant pain? We could consider getting an ultrasound of the gallbladder if she is having any symptoms. ______

## 2013-01-14 NOTE — Assessment & Plan Note (Signed)
The patient has severe dyspnea with exertion.  She also experiences paroxysmal nocturnal dyspnea.  She sleeps on one pillow but she frequently awakens and has to sit up to breathe.  She is unable to breathe lying on her left side and can only sleep breathing on her right side.  She is not having any chest pain.  She may not have been taking her 20 mg Lasix daily.  She is not sure whether she has been taking Lasix or not.  He has been off an ACE inhibitor since her last visit and she presently is on carvedilol 12.5 mg twice a day and has continued to take Toprol XL 25 mg once a day.  Despite this her pulse still remains rapid and irregular in atrial fibrillation.

## 2013-01-14 NOTE — Assessment & Plan Note (Signed)
The patient's blood pressure is very high on arrival today.  On recheck it was still 150/100.  This reflects the fact that she has been off her ACE inhibitor and may also have been off her diuretic.  Neither she nor her son are terribly clear about what medicines she is on.

## 2013-01-14 NOTE — Progress Notes (Signed)
Natasha Livingston Date of Birth:  Dec 02, 1933 [facility] [address] [place], [place]  [address] [phone]         Fax   [phone]  History of Present Illness: This pleasant 79 year old woman is seen for a followup office visit. She has a history of established atrial fibrillation. History of high blood pressure. She does have a history of atypical chest pain and had a Lexus scan Myoview stress test on 12/22/2012 which showed no evidence of ischemia and showed a small scar on the lateral wall. The study was not gated because of her atrial fibrillation. She has had remote CABG on 04/12/2007 by Dr. Maren Beach. She has a past history of mitral regurgitation secondary to mitral valve prolapse. She also has a history of hypothyroidism and a history of essential hypertension. She complains of poor energy and she tires easily.  Her last echocardiogram on 07/13/12 showed an ejection fraction of 40% and mild mitral regurgitation. She is now on carvedilol. Presently she is on a dose of carvedilol 12.5 mg twice a day.  She was seen about a month ago with a severe generalized maculopapular rash possibly drug allergy and her dermatologist also thought it might be related to dust mites.  We stopped her ramipril and her hydrochlorothiazide at her last visit.  Current Outpatient Prescriptions  Medication Sig Dispense Refill  . ALPRAZolam (XANAX) 0.25 MG tablet 1/2 bid  30 tablet  5  . aspirin 81 MG tablet Take 81 mg by mouth daily.        . carvedilol (COREG) 25 MG tablet Take 1 tablet (25 mg total) by mouth 2 (two) times daily.  60 tablet  5  . furosemide (LASIX) 40 MG tablet Take 1 tablet (40 mg total) by mouth daily.  30 tablet  5  . gabapentin (NEURONTIN) 800 MG tablet 1/2 tablet in the morning and 1 tablet in the evening  135 tablet  3  . levothyroxine (SYNTHROID, LEVOTHROID) 25 MCG tablet TAKE 1 TABLET BY MOUTH EVERY DAY  30 tablet  8  . metoprolol succinate (TOPROL XL) 25 MG 24  hr tablet Take 1 tablet (25 mg total) by mouth daily.  30 tablet  6  . potassium chloride SA (K-DUR,KLOR-CON) 20 MEQ tablet Take 20 mEq by mouth 2 (two) times daily.      Marland Kitchen PROCTOSOL HC 2.5 % rectal cream USE AS DIRECTED  28 g  2  . warfarin (COUMADIN) 5 MG tablet 1 daily or as directed  90 tablet  3  . lisinopril (PRINIVIL,ZESTRIL) 10 MG tablet Take 1 tablet (10 mg total) by mouth daily.  30 tablet  5   No current facility-administered medications for this visit.    Allergies  Allergen Reactions  . Amiodarone   . Crestor (Rosuvastatin Calcium)   . Lipitor (Atorvastatin Calcium)     Patient Active Problem List  Diagnosis  . Atrial fibrillation  . Chest discomfort  . Dizziness  . MVP (mitral valve prolapse)  . DOE (dyspnea on exertion)  . Bruises easily  . Back pain  . Forgetfulness  . Atrial fibrillation  . Hypertension  . Hypothyroidism  . Aortic stenosis  . Hyperlipidemia  . Diverticulitis  . Interstitial nephritis  . Neuropathy  . Ischemic heart disease  . Malaise and fatigue  . Edema of foot  . Encounter for long-term (current) use of anticoagulants  . Benign hypertensive heart disease without heart failure  . Dermatitis    History  Smoking status  . Never Smoker   Smokeless tobacco  . Not on file    History  Alcohol Use No    Family History  Problem Relation Age of Onset  . Cerebral aneurysm    . Cerebral aneurysm Father     Review of Systems: Constitutional: no fever chills diaphoresis or fatigue or change in weight.  Head and neck: no hearing loss, no epistaxis, no photophobia or visual disturbance. Respiratory: No cough, shortness of breath or wheezing. Cardiovascular: No chest pain peripheral edema, palpitations. Gastrointestinal: No abdominal distention, no abdominal pain, no change in bowel habits hematochezia or melena. Genitourinary: No dysuria, no frequency, no urgency, no nocturia. Musculoskeletal:No arthralgias, no back pain, no gait  disturbance or myalgias. Neurological: No dizziness, no headaches, no numbness, no seizures, no syncope, no weakness, no tremors. Hematologic: No lymphadenopathy, no easy bruising. Psychiatric: No confusion, no hallucinations, no sleep disturbance.    Physical Exam: Filed Vitals:   01/14/13 1355  BP: 164/112  Pulse: 84   recheck blood pressure is 150/100.  The general appearance reveals an elderly somewhat sallow complexion woman in no acute distress.  Head and neck exam reveal that the jugular venous pressure is elevated.  The complexion is sallow.  Carotids have normal upstroke.  The chest reveals a few rales at the left base and no expiratory wheezing.  The heart reveals a grade 3/6 murmur of mitral regurgitation at apex.  Pulse is irregular.  The abdomen is soft and nontender without masses.  The extremities show no edema or phlebitis.  Pedal pulses are present.   Assessment / Plan: We are going to get a chest x-ray today.  I am concerned that she may be in more congestive heart failure by her history and by lack of taking her Lasix.  We are going to add lisinopril 10 mg one daily to her regimen.  We are changing her furosemide to 40 mg daily.  We are increasing carvedilol to 25 mg twice a day for better rate control.  She may also take an extra Toprol 25 mg tablet when necessary if necessary for rate control.  She will no longer take propranolol.  We are checking a CBC hepatic function panel and basal metabolic panel today.  She will return in one month for office visit EKG and basal metabolic panel.

## 2013-01-20 ENCOUNTER — Telehealth: Payer: Self-pay | Admitting: *Deleted

## 2013-01-20 ENCOUNTER — Ambulatory Visit (INDEPENDENT_AMBULATORY_CARE_PROVIDER_SITE_OTHER)
Admission: RE | Admit: 2013-01-20 | Discharge: 2013-01-20 | Disposition: A | Discharge status: Home / Self Care | Payer: Medicare Other | Source: Ambulatory Visit | Attending: Cardiology | Admitting: Cardiology

## 2013-01-20 DIAGNOSIS — Z951 Presence of aortocoronary bypass graft: Secondary | ICD-10-CM | POA: Diagnosis not present

## 2013-01-20 DIAGNOSIS — R899 Unspecified abnormal finding in specimens from other organs, systems and tissues: Secondary | ICD-10-CM

## 2013-01-20 DIAGNOSIS — I499 Cardiac arrhythmia, unspecified: Secondary | ICD-10-CM | POA: Diagnosis not present

## 2013-01-20 DIAGNOSIS — R0989 Other specified symptoms and signs involving the circulatory and respiratory systems: Secondary | ICD-10-CM | POA: Diagnosis not present

## 2013-01-20 DIAGNOSIS — R0602 Shortness of breath: Secondary | ICD-10-CM | POA: Diagnosis not present

## 2013-01-20 DIAGNOSIS — R0609 Other forms of dyspnea: Secondary | ICD-10-CM

## 2013-01-20 NOTE — Telephone Encounter (Signed)
Message copied by Burnell Blanks on Thu Jan 20, 2013 10:01 AM ------      Message from: Cassell Clement      Created: Tue Jan 18, 2013  8:47 PM       Continue same meds. Get another HFP when she returns for her next visit. ------

## 2013-01-20 NOTE — Telephone Encounter (Signed)
Advised patient and scheduled labs at 4/14 ov

## 2013-01-24 ENCOUNTER — Other Ambulatory Visit: Payer: Self-pay | Admitting: Cardiology

## 2013-01-24 DIAGNOSIS — F419 Anxiety disorder, unspecified: Secondary | ICD-10-CM

## 2013-01-24 MED ORDER — ALPRAZOLAM 0.25 MG PO TABS: ORAL_TABLET | ORAL | Status: DC

## 2013-01-24 NOTE — Telephone Encounter (Signed)
Advised xray not reviewed by  Dr. Patty Sermons yet. Did ask for refill on Xanax, called to pharmacy

## 2013-01-24 NOTE — Telephone Encounter (Signed)
Pt requesting results of chest x-ray.

## 2013-01-25 ENCOUNTER — Telehealth: Payer: Self-pay | Admitting: *Deleted

## 2013-01-25 NOTE — Telephone Encounter (Signed)
Advised patient

## 2013-01-25 NOTE — Telephone Encounter (Signed)
Message copied by Burnell Blanks on Tue Jan 25, 2013  2:30 PM ------      Message from: Cassell Clement      Created: Mon Jan 24, 2013  8:40 PM       Please report.  The xray has not changed since August. There was no pulmonary edema.Heart size had not changed.  CSD. ------

## 2013-02-02 ENCOUNTER — Ambulatory Visit (INDEPENDENT_AMBULATORY_CARE_PROVIDER_SITE_OTHER): Payer: Medicare Other | Admitting: Pharmacist

## 2013-02-02 DIAGNOSIS — I4891 Unspecified atrial fibrillation: Secondary | ICD-10-CM | POA: Diagnosis not present

## 2013-02-02 DIAGNOSIS — Z7901 Long term (current) use of anticoagulants: Secondary | ICD-10-CM | POA: Diagnosis not present

## 2013-02-08 ENCOUNTER — Telehealth: Payer: Self-pay | Admitting: Internal Medicine

## 2013-02-08 NOTE — Telephone Encounter (Signed)
Spoke with patient who c/o "welts" on legs and arms.  Patient states she has been having problems with rash for a while.  States it initially started in March on her back; she saw a dermatologist and was given a cream and the cream helped.  At last office visit, Dr. Patty Sermons changed some of her medications but recently she has begun to notice the "welts" on her arms and legs and the itching is terrible.  Patient states that OTC Benadryl is helping her some and she continues to use the cream the dermatologist gave her which is also helping her some.  Patient is wondering if the Carvedilol is causing this.  Patient states that she was told the problem might be coming from dust mites but she has thoroughly cleaned all of her bedding and she is still having issues.  She states her husband is not having any problems like this.  Routing to Dr. Patty Sermons to advise.  Patient verbalized understanding of plan of care to seek advice from TB.

## 2013-02-08 NOTE — Telephone Encounter (Signed)
Called patient and gave her Dr. Yevonne Pax instructions to stop Carvedilol and to increase Toprol to 50 mg per day and to see if stopping the Carvedilol helps her rash.  Patient was asked to write down my instructions and she verbalized understanding.

## 2013-02-08 NOTE — Telephone Encounter (Signed)
New Prob    Pt states medication is causing her to break out. Overtime it has gotten worse. Concerned and would like to speak to nurse.

## 2013-02-08 NOTE — Telephone Encounter (Signed)
Last month she thought it was from the ramipril and/or the HCTZ which we stopped. Now she thinks it is from the carvedilol.  It possibly could be. So stop the carvedilol and see if  The rash gets better. Increase the Toprol from 25mg  daily to 50 mg daily to help with rate control.

## 2013-02-14 ENCOUNTER — Encounter: Payer: Self-pay | Admitting: Cardiology

## 2013-02-14 ENCOUNTER — Telehealth: Payer: Self-pay | Admitting: Cardiology

## 2013-02-14 ENCOUNTER — Other Ambulatory Visit (INDEPENDENT_AMBULATORY_CARE_PROVIDER_SITE_OTHER): Payer: Medicare Other

## 2013-02-14 ENCOUNTER — Other Ambulatory Visit: Payer: Self-pay | Admitting: *Deleted

## 2013-02-14 ENCOUNTER — Ambulatory Visit (INDEPENDENT_AMBULATORY_CARE_PROVIDER_SITE_OTHER): Payer: Medicare Other | Admitting: Cardiology

## 2013-02-14 ENCOUNTER — Ambulatory Visit (INDEPENDENT_AMBULATORY_CARE_PROVIDER_SITE_OTHER): Payer: Medicare Other

## 2013-02-14 VITALS — BP 128/90 | HR 78 | Ht 67.0 in | Wt 144.8 lb

## 2013-02-14 DIAGNOSIS — I4891 Unspecified atrial fibrillation: Secondary | ICD-10-CM

## 2013-02-14 DIAGNOSIS — Z7901 Long term (current) use of anticoagulants: Secondary | ICD-10-CM | POA: Diagnosis not present

## 2013-02-14 DIAGNOSIS — L259 Unspecified contact dermatitis, unspecified cause: Secondary | ICD-10-CM | POA: Diagnosis not present

## 2013-02-14 DIAGNOSIS — R7989 Other specified abnormal findings of blood chemistry: Secondary | ICD-10-CM | POA: Diagnosis not present

## 2013-02-14 DIAGNOSIS — R0989 Other specified symptoms and signs involving the circulatory and respiratory systems: Secondary | ICD-10-CM

## 2013-02-14 DIAGNOSIS — I259 Chronic ischemic heart disease, unspecified: Secondary | ICD-10-CM

## 2013-02-14 DIAGNOSIS — L309 Dermatitis, unspecified: Secondary | ICD-10-CM

## 2013-02-14 DIAGNOSIS — R0609 Other forms of dyspnea: Secondary | ICD-10-CM

## 2013-02-14 LAB — CBC WITH DIFFERENTIAL/PLATELET
Basophils Absolute: 0 10*3/uL (ref 0.0–0.1)
Eosinophils Absolute: 0.3 10*3/uL (ref 0.0–0.7)
HCT: 40.8 % (ref 36.0–46.0)
Lymphs Abs: 0.9 10*3/uL (ref 0.7–4.0)
MCHC: 33.4 g/dL (ref 30.0–36.0)
Monocytes Absolute: 0.9 10*3/uL (ref 0.1–1.0)
Monocytes Relative: 12.4 % — ABNORMAL HIGH (ref 3.0–12.0)
Platelets: 220 10*3/uL (ref 150.0–400.0)
RDW: 15.7 % — ABNORMAL HIGH (ref 11.5–14.6)

## 2013-02-14 LAB — BASIC METABOLIC PANEL
Calcium: 9.4 mg/dL (ref 8.4–10.5)
GFR: 42.98 mL/min — ABNORMAL LOW (ref >60.00)
Sodium: 139 mEq/L (ref 135–145)

## 2013-02-14 LAB — POCT INR: INR: 1.9

## 2013-02-14 MED ORDER — POTASSIUM CHLORIDE CRYS ER 20 MEQ PO TBCR: 20.0000 meq | EXTENDED_RELEASE_TABLET | Freq: Two times a day (BID) | ORAL | Status: DC

## 2013-02-14 NOTE — Patient Instructions (Signed)
Your physician recommends that you continue on your current medications as directed. Please refer to the Current Medication list given to you today.  Your physician recommends that you schedule a follow-up appointment in: 2 month OV cbc bmet  Labs today (cbc and bmet)

## 2013-02-14 NOTE — Assessment & Plan Note (Signed)
Her dyspnea on exertion appears to have improved and she thinks that her breathing is better.  She is now on metoprolol rather than carvedilol.  She had a chest x-ray on 01/20/13 which showed cardiomegaly unchanged since August 2013

## 2013-02-14 NOTE — Telephone Encounter (Signed)
Will send over new RX

## 2013-02-14 NOTE — Assessment & Plan Note (Signed)
The patient remains on long-term Coumadin and has not had any TIA symptoms.  No excessive bleeding or hemorrhage noted

## 2013-02-14 NOTE — Progress Notes (Signed)
[name] Date of Birth:  April 20, 1934 [facility] [address] [place], [place]  [address] [phone]         Fax   [phone]  History of Present Illness: This pleasant 79 year old woman is seen for a followup office visit. She has a history of established atrial fibrillation. History of high blood pressure. She does have a history of atypical chest pain and had a Lexus scan Myoview stress test on 12/22/2012 which showed no evidence of ischemia and showed a small scar on the lateral wall. The study was not gated because of her atrial fibrillation. She has had remote CABG on 04/12/2007 by Dr. Maren Beach. She has a past history of mitral regurgitation secondary to mitral valve prolapse. She also has a history of hypothyroidism and a history of essential hypertension. She complains of poor energy and she tires easily.  Her last echocardiogram on 07/13/12 showed an ejection fraction of 40% and mild mitral regurgitation. She is now on carvedilol. Presently she is on a dose of carvedilol 12.5 mg twice a day.  She was seen about a month ago with a severe generalized maculopapular rash possibly drug allergy and her dermatologist also thought it might be related to dust mites. We stopped her ramipril and her hydrochlorothiazide at her last visit.  However her skin rash did not seem to improve.  Therefore since we last saw her we also instructed her to stop carvedilol and resume metoprolol.  This also did not seem to affect the skin rash which is still present.  She does not agree with her first dermatologists opinion that the rash was due to bed mites.  The rash has not improved with application of cream designed for treatment of bed mites or scabies.  She will seek another dermatologists opinion.   Current Outpatient Prescriptions  Medication Sig Dispense Refill  . ALPRAZolam (XANAX) 0.25 MG tablet 1/2 bid  30 tablet  5  . aspirin 81 MG tablet Take 81 mg by mouth daily.          . DiphenhydrAMINE HCl (BENADRYL ALLERGY PO) Take by mouth every 6 (six) hours.      . furosemide (LASIX) 40 MG tablet Take 1 tablet (40 mg total) by mouth daily.  30 tablet  5  . gabapentin (NEURONTIN) 800 MG tablet 1/2 tablet in the morning and 1 tablet in the evening  135 tablet  3  . levothyroxine (SYNTHROID, LEVOTHROID) 25 MCG tablet TAKE 1 TABLET BY MOUTH EVERY DAY  30 tablet  8  . lisinopril (PRINIVIL,ZESTRIL) 10 MG tablet Take 1 tablet (10 mg total) by mouth daily.  30 tablet  5  . metoprolol succinate (TOPROL XL) 25 MG 24 hr tablet Take 1 tablet (25 mg total) by mouth daily.  30 tablet  6  . PROCTOSOL HC 2.5 % rectal cream USE AS DIRECTED  28 g  2  . warfarin (COUMADIN) 5 MG tablet 1 daily or as directed  90 tablet  3  . potassium chloride SA (K-DUR,KLOR-CON) 20 MEQ tablet Take 1 tablet (20 mEq total) by mouth 2 (two) times daily.  60 tablet  11   No current facility-administered medications for this visit.    Allergies  Allergen Reactions  . Amiodarone   . Crestor (Rosuvastatin Calcium)   . Lipitor (Atorvastatin Calcium)     Patient Active Problem List  Diagnosis  . Atrial fibrillation  . Chest discomfort  . Dizziness  . MVP (mitral valve prolapse)  .  DOE (dyspnea on exertion)  . Bruises easily  . Back pain  . Forgetfulness  . Atrial fibrillation  . Hypertension  . Hypothyroidism  . Aortic stenosis  . Hyperlipidemia  . Diverticulitis  . Interstitial nephritis  . Neuropathy  . Ischemic heart disease  . Malaise and fatigue  . Edema of foot  . Encounter for long-term (current) use of anticoagulants  . Benign hypertensive heart disease without heart failure  . Dermatitis    History  Smoking status  . Never Smoker   Smokeless tobacco  . Not on file    History  Alcohol Use No    Family History  Problem Relation Age of Onset  . Cerebral aneurysm    . Cerebral aneurysm Father     Review of Systems: Constitutional: no fever chills diaphoresis or  fatigue or change in weight.  Head and neck: no hearing loss, no epistaxis, no photophobia or visual disturbance. Respiratory: No cough, shortness of breath or wheezing. Cardiovascular: No chest pain peripheral edema, palpitations. Gastrointestinal: No abdominal distention, no abdominal pain, no change in bowel habits hematochezia or melena. Genitourinary: No dysuria, no frequency, no urgency, no nocturia. Musculoskeletal:No arthralgias, no back pain, no gait disturbance or myalgias. Neurological: No dizziness, no headaches, no numbness, no seizures, no syncope, no weakness, no tremors. Hematologic: No lymphadenopathy, no easy bruising. Psychiatric: No confusion, no hallucinations, no sleep disturbance.    Physical Exam: Filed Vitals:   02/14/13 1008  BP: 128/90  Pulse: 78   the general appearance reveals a well-developed well-nourished elderly woman in no distress.  Today is her birthday though she did not tell [place] at the time.The head and neck exam reveals pupils equal and reactive.  Extraocular movements are full.  There is no scleral icterus.  The mouth and pharynx are normal.  The neck is supple.  The carotids reveal no bruits.  The jugular venous pressure is normal.  The  thyroid is not enlarged.  There is no lymphadenopathy.  The chest is clear to percussion and auscultation.  There are no rales or rhonchi.  Expansion of the chest is symmetrical.  The precordium is quiet.  The first heart sound is normal.  The second heart sound is physiologically split.  There is no murmur gallop rub or click.  There is no abnormal lift or heave.  The abdomen is soft and nontender.  The bowel sounds are normal.  The liver and spleen are not enlarged.  There are no abdominal masses.  There are no abdominal bruits.  Extremities reveal good pedal pulses.  There is no phlebitis or edema.  There is no cyanosis or clubbing.  Strength is normal and symmetrical in all extremities.  There is no lateralizing weakness.   There are no sensory deficits.  The skin is warm and dry.  There is a generalized maculopapular scaling type of skin rash on the extremities and trunk.  It is quite pruritic and she has difficulty not scratching it.   EKG shows atrial fibrillation with a controlled ventricular rate of 88  Assessment / Plan: The patient is to continue same medication.  She has a history of hypokalemia and is now on K. Dur 20 mEq twice a day we updated her prescription we are checking lab work today.  Recheck in 2 months for office visit CBC basal metabolic panel and hepatic function panel

## 2013-02-14 NOTE — Telephone Encounter (Signed)
New problem   Pharmacy need to know correct dosage for pt's medication for Klor-Con m20. And they also need a new prescription. Please call

## 2013-02-14 NOTE — Assessment & Plan Note (Signed)
The patient has not been experiencing any recent recurrent chest pain or angina

## 2013-02-15 ENCOUNTER — Telehealth: Payer: Self-pay | Admitting: *Deleted

## 2013-02-15 ENCOUNTER — Other Ambulatory Visit (INDEPENDENT_AMBULATORY_CARE_PROVIDER_SITE_OTHER): Payer: Medicare Other

## 2013-02-15 DIAGNOSIS — R7989 Other specified abnormal findings of blood chemistry: Secondary | ICD-10-CM | POA: Diagnosis not present

## 2013-02-15 LAB — HEPATIC FUNCTION PANEL
ALT: 24 U/L (ref 0–35)
AST: 25 U/L (ref 0–37)
Albumin: 4 g/dL (ref 3.5–5.2)
Alkaline Phosphatase: 139 U/L — ABNORMAL HIGH (ref 39–117)

## 2013-02-15 NOTE — Telephone Encounter (Signed)
Message copied by Burnell Blanks on Tue Feb 15, 2013 10:26 AM ------      Message from: Cassell Clement      Created: Tue Feb 15, 2013 10:14 AM       Please report to patient.  The recent labs are stable. Continue same medication and careful diet. LFTs better. ------

## 2013-02-15 NOTE — Telephone Encounter (Signed)
Advised patient of lab results  

## 2013-02-15 NOTE — Progress Notes (Signed)
Quick Note:  Please report to patient. The recent labs are stable. Continue same medication and careful diet. ______ 

## 2013-02-15 NOTE — Progress Notes (Signed)
Quick Note:  Please report to patient. The recent labs are stable. Continue same medication and careful diet. LFTs better. ______

## 2013-02-15 NOTE — Telephone Encounter (Signed)
Message copied by Burnell Blanks on Tue Feb 15, 2013 10:26 AM ------      Message from: Cassell Clement      Created: Tue Feb 15, 2013  7:59 AM       Please report to patient.  The recent labs are stable. Continue same medication and careful diet. ------

## 2013-02-24 ENCOUNTER — Telehealth: Payer: Self-pay | Admitting: Cardiology

## 2013-02-24 MED ORDER — METOPROLOL SUCCINATE ER 25 MG PO TB24: 25.0000 mg | ORAL_TABLET | Freq: Two times a day (BID) | ORAL | Status: DC

## 2013-02-24 NOTE — Telephone Encounter (Signed)
F/u   Previous msg caller is pts husband

## 2013-02-24 NOTE — Telephone Encounter (Signed)
F/u    Pt has called again regarding medication causing itching

## 2013-02-24 NOTE — Telephone Encounter (Signed)
Patient concerned potassium causing her itching, had previously thought coming from Carvedilol, Ramipril, and/or HCTZ. Will forward to  Dr. Patty Sermons for review. Patient aware will be next week before  Dr. Patty Sermons back in the office

## 2013-02-24 NOTE — Telephone Encounter (Signed)
F/u   pts son calling w/another question

## 2013-02-24 NOTE — Telephone Encounter (Signed)
Patient had Metoprolol increasedon 02/08/13 per telephone call, new Rx sent to pharmacy. Did speak with son regarding potassium as well and will await  Dr. Patty Sermons 's suggestions.

## 2013-02-24 NOTE — Telephone Encounter (Signed)
New Prob      Pt states she is experiencing an allergic reaction (itching all over) to medication she is on. Would like to speak to nurse.

## 2013-03-01 ENCOUNTER — Ambulatory Visit (INDEPENDENT_AMBULATORY_CARE_PROVIDER_SITE_OTHER): Payer: Medicare Other | Admitting: *Deleted

## 2013-03-01 DIAGNOSIS — Z7901 Long term (current) use of anticoagulants: Secondary | ICD-10-CM | POA: Diagnosis not present

## 2013-03-01 DIAGNOSIS — I4891 Unspecified atrial fibrillation: Secondary | ICD-10-CM | POA: Diagnosis not present

## 2013-03-01 LAB — POCT INR: INR: 3.7

## 2013-03-02 NOTE — Telephone Encounter (Signed)
Leave off potassium for several days and just eat bananas and fruit etc and see if the itching goes away.  Potassium is a normal substance for the body so I doubt whether it is causing the itching.

## 2013-03-04 NOTE — Telephone Encounter (Signed)
Advised patient and she will keep her appointment with Dr Terri Piedra on Monday. Advised to call back with update

## 2013-03-07 DIAGNOSIS — L259 Unspecified contact dermatitis, unspecified cause: Secondary | ICD-10-CM | POA: Diagnosis not present

## 2013-03-22 ENCOUNTER — Ambulatory Visit (INDEPENDENT_AMBULATORY_CARE_PROVIDER_SITE_OTHER): Payer: Medicare Other | Admitting: Pharmacist

## 2013-03-22 DIAGNOSIS — I4891 Unspecified atrial fibrillation: Secondary | ICD-10-CM | POA: Diagnosis not present

## 2013-03-22 DIAGNOSIS — Z7901 Long term (current) use of anticoagulants: Secondary | ICD-10-CM | POA: Diagnosis not present

## 2013-03-31 DIAGNOSIS — R82998 Other abnormal findings in urine: Secondary | ICD-10-CM | POA: Diagnosis not present

## 2013-03-31 DIAGNOSIS — N3010 Interstitial cystitis (chronic) without hematuria: Secondary | ICD-10-CM | POA: Diagnosis not present

## 2013-04-04 DIAGNOSIS — L259 Unspecified contact dermatitis, unspecified cause: Secondary | ICD-10-CM | POA: Diagnosis not present

## 2013-04-05 ENCOUNTER — Other Ambulatory Visit: Payer: Self-pay | Admitting: *Deleted

## 2013-04-05 MED ORDER — FUROSEMIDE 40 MG PO TABS: 40.0000 mg | ORAL_TABLET | Freq: Every day | ORAL | Status: DC

## 2013-04-20 ENCOUNTER — Other Ambulatory Visit: Payer: Medicare Other

## 2013-04-20 ENCOUNTER — Ambulatory Visit (INDEPENDENT_AMBULATORY_CARE_PROVIDER_SITE_OTHER): Payer: Medicare Other | Admitting: Cardiology

## 2013-04-20 ENCOUNTER — Encounter: Payer: Self-pay | Admitting: Cardiology

## 2013-04-20 ENCOUNTER — Ambulatory Visit (INDEPENDENT_AMBULATORY_CARE_PROVIDER_SITE_OTHER): Payer: Medicare Other | Admitting: *Deleted

## 2013-04-20 VITALS — BP 130/80 | HR 64 | Ht 67.0 in | Wt 146.2 lb

## 2013-04-20 DIAGNOSIS — I4891 Unspecified atrial fibrillation: Secondary | ICD-10-CM

## 2013-04-20 DIAGNOSIS — Z7901 Long term (current) use of anticoagulants: Secondary | ICD-10-CM | POA: Diagnosis not present

## 2013-04-20 DIAGNOSIS — R5383 Other fatigue: Secondary | ICD-10-CM | POA: Diagnosis not present

## 2013-04-20 DIAGNOSIS — I259 Chronic ischemic heart disease, unspecified: Secondary | ICD-10-CM

## 2013-04-20 DIAGNOSIS — R5381 Other malaise: Secondary | ICD-10-CM | POA: Diagnosis not present

## 2013-04-20 DIAGNOSIS — E039 Hypothyroidism, unspecified: Secondary | ICD-10-CM | POA: Diagnosis not present

## 2013-04-20 DIAGNOSIS — I10 Essential (primary) hypertension: Secondary | ICD-10-CM

## 2013-04-20 LAB — HEPATIC FUNCTION PANEL
AST: 34 U/L (ref 0–37)
Albumin: 4.1 g/dL (ref 3.5–5.2)
Total Protein: 8 g/dL (ref 6.0–8.3)

## 2013-04-20 LAB — CBC
MCHC: 33.9 g/dL (ref 30.0–36.0)
MCV: 92 fl (ref 78.0–100.0)
Platelets: 248 10*3/uL (ref 150.0–400.0)

## 2013-04-20 LAB — BASIC METABOLIC PANEL
BUN: 26 mg/dL — ABNORMAL HIGH (ref 6–23)
CO2: 30 mEq/L (ref 19–32)
Calcium: 9.3 mg/dL (ref 8.4–10.5)
GFR: 39.06 mL/min — ABNORMAL LOW (ref >60.00)
Glucose, Bld: 109 mg/dL — ABNORMAL HIGH (ref 70–99)
Potassium: 4.2 mEq/L (ref 3.5–5.1)

## 2013-04-20 LAB — TSH: TSH: 1.18 u[IU]/mL (ref 0.35–5.50)

## 2013-04-20 NOTE — Patient Instructions (Addendum)
Your physician recommends that you return for lab work in: today cbc,tsh,hfp,bmet  Your physician wants you to follow-up in: 4 months with ekg  You will receive a reminder letter in the mail two months in advance. If you don't receive a letter, please call our office to schedule the follow-up appointment.  Your physician recommends that you continue on your current medications as directed. Please refer to the Current Medication list given to you today.

## 2013-04-20 NOTE — Progress Notes (Signed)
Natasha Livingston Date of Birth:  1934-11-03 [facility] [address] [place], [place]  [address] [phone]         Fax   [phone]  History of Present Illness: This pleasant 79 year old woman is seen for a followup office visit. She has a history of established atrial fibrillation. History of high blood pressure. She does have a history of atypical chest pain and had a Lexus scan Myoview stress test on 12/22/2012 which showed no evidence of ischemia and showed a small scar on the lateral wall. The study was not gated because of her atrial fibrillation. She has had remote CABG on 04/12/2007 by Dr. Maren Beach. She has a past history of mitral regurgitation secondary to mitral valve prolapse. She also has a history of hypothyroidism and a history of essential hypertension.   Her last echocardiogram on 07/13/12 showed an ejection fraction of 40% and mild mitral regurgitation.  She previously had been on carvedilol but had inadequate rate control.  At her last visit we switched her to metoprolol and she has felt much better since then.   Current Outpatient Prescriptions  Medication Sig Dispense Refill  . ALPRAZolam (XANAX) 0.25 MG tablet 1/2 bid  30 tablet  5  . aspirin 81 MG tablet Take 81 mg by mouth daily.        . DiphenhydrAMINE HCl (BENADRYL ALLERGY PO) Take by mouth every 6 (six) hours.      . furosemide (LASIX) 40 MG tablet Take 1 tablet (40 mg total) by mouth daily.  30 tablet  5  . gabapentin (NEURONTIN) 800 MG tablet 1/2 tablet in the morning and 1 tablet in the evening  135 tablet  3  . levothyroxine (SYNTHROID, LEVOTHROID) 25 MCG tablet TAKE 1 TABLET BY MOUTH EVERY DAY  30 tablet  8  . lisinopril (PRINIVIL,ZESTRIL) 10 MG tablet Take 1 tablet (10 mg total) by mouth daily.  30 tablet  5  . metoprolol succinate (TOPROL XL) 25 MG 24 hr tablet Take 1 tablet (25 mg total) by mouth 2 (two) times daily.  60 tablet  5  . potassium chloride SA (K-DUR,KLOR-CON) 20 MEQ  tablet Take 1 tablet (20 mEq total) by mouth 2 (two) times daily.  60 tablet  11  . PROCTOSOL HC 2.5 % rectal cream USE AS DIRECTED  28 g  2  . warfarin (COUMADIN) 5 MG tablet 1 daily or as directed  90 tablet  3   No current facility-administered medications for this visit.    Allergies  Allergen Reactions  . Amiodarone   . Crestor (Rosuvastatin Calcium)   . Lipitor (Atorvastatin Calcium)     Patient Active Problem List   Diagnosis Date Noted  . Edema of foot 04/28/2011    Priority: High  . Malaise and fatigue 04/15/2011    Priority: High  . MVP (mitral valve prolapse)     Priority: High  . Atrial fibrillation 02/03/2011    Priority: High  . DOE (dyspnea on exertion)     Priority: Medium  . Hypothyroid 04/20/2013  . Dermatitis 12/31/2012  . Benign hypertensive heart disease without heart failure 12/24/2011  . Encounter for long-term (current) use of anticoagulants 08/27/2011  . Chest discomfort   . Dizziness   . Bruises easily   . Back pain   . Forgetfulness   . Atrial fibrillation   . Hypertension   . Hypothyroidism   . Aortic stenosis   . Hyperlipidemia   . Diverticulitis   .  Interstitial nephritis   . Neuropathy   . Ischemic heart disease     History  Smoking status  . Never Smoker   Smokeless tobacco  . Not on file    History  Alcohol Use No    Family History  Problem Relation Age of Onset  . Cerebral aneurysm    . Cerebral aneurysm Father     Review of Systems: Constitutional: no fever chills diaphoresis or fatigue or change in weight.  Head and neck: no hearing loss, no epistaxis, no photophobia or visual disturbance. Respiratory: No cough, shortness of breath or wheezing. Cardiovascular: No chest pain peripheral edema, palpitations. Gastrointestinal: No abdominal distention, no abdominal pain, no change in bowel habits hematochezia or melena. Genitourinary: No dysuria, no frequency, no urgency, no nocturia. Musculoskeletal:No arthralgias,  no back pain, no gait disturbance or myalgias. Neurological: No dizziness, no headaches, no numbness, no seizures, no syncope, no weakness, no tremors. Hematologic: No lymphadenopathy, no easy bruising. Psychiatric: No confusion, no hallucinations, no sleep disturbance.    Physical Exam: Filed Vitals:   04/20/13 1036  BP: 130/80  Pulse: 64   the general appearance reveals a pleasant elderly woman in no distress.The head and neck exam reveals pupils equal and reactive.  Extraocular movements are full.  There is no scleral icterus.  The mouth and pharynx are normal.  The neck is supple.  The carotids reveal no bruits.  The jugular venous pressure is normal.  The  thyroid is not enlarged.  There is no lymphadenopathy.  The chest is clear to percussion and auscultation.  There are no rales or rhonchi.  Expansion of the chest is symmetrical.  The precordium is quiet.  The pulse is irregularly irregular  The first heart sound is normal.  The second heart sound is physiologically split.  There is no  gallop rub or click.  There is a soft systolic murmur. There is no abnormal lift or heave.  The abdomen is soft and nontender.  The bowel sounds are normal.  The liver and spleen are not enlarged.  There are no abdominal masses.  There are no abdominal bruits.  Extremities reveal good pedal pulses.  There is no phlebitis or edema.  There is no cyanosis or clubbing.  Strength is normal and symmetrical in all extremities.  There is no lateralizing weakness.  There are no sensory deficits.  The skin is warm and dry.  There is no rash.     Assessment / Plan: Continue same medication.  Return in 4 months for office visit EKG CBC lipid panel hepatic function panel and basal metabolic panel. The patient is hypothyroid and we are checking CBC TSH and chemistries today

## 2013-04-20 NOTE — Assessment & Plan Note (Signed)
The patient is in permanent atrial fibrillation on Coumadin.  Rate control is adequate on present dose of metoprolol.  She has not had any TIA symptoms

## 2013-04-20 NOTE — Assessment & Plan Note (Signed)
Blood pressure has been improved since last visit.  Her exertional dyspnea has improved.

## 2013-04-20 NOTE — Progress Notes (Signed)
Quick Note:  Please report to patient. The recent labs are stable. Continue same medication and careful diet. Kidneys drier. Drink more water. ______ 

## 2013-04-20 NOTE — Assessment & Plan Note (Signed)
The patient has not been having a recurrent chest pain or angina. 

## 2013-05-04 ENCOUNTER — Other Ambulatory Visit: Payer: Self-pay | Admitting: Cardiology

## 2013-05-04 DIAGNOSIS — E039 Hypothyroidism, unspecified: Secondary | ICD-10-CM

## 2013-05-06 IMAGING — CR DG CHEST 2V
2 series · 2 of 2 positions shown · non-contrast
Comparison: 06/22/2012

CLINICAL DATA: Shortness of breath, status post CABG, irregular
heart beat

CHEST - 2 VIEW

[view not recorded (1 of 2)]
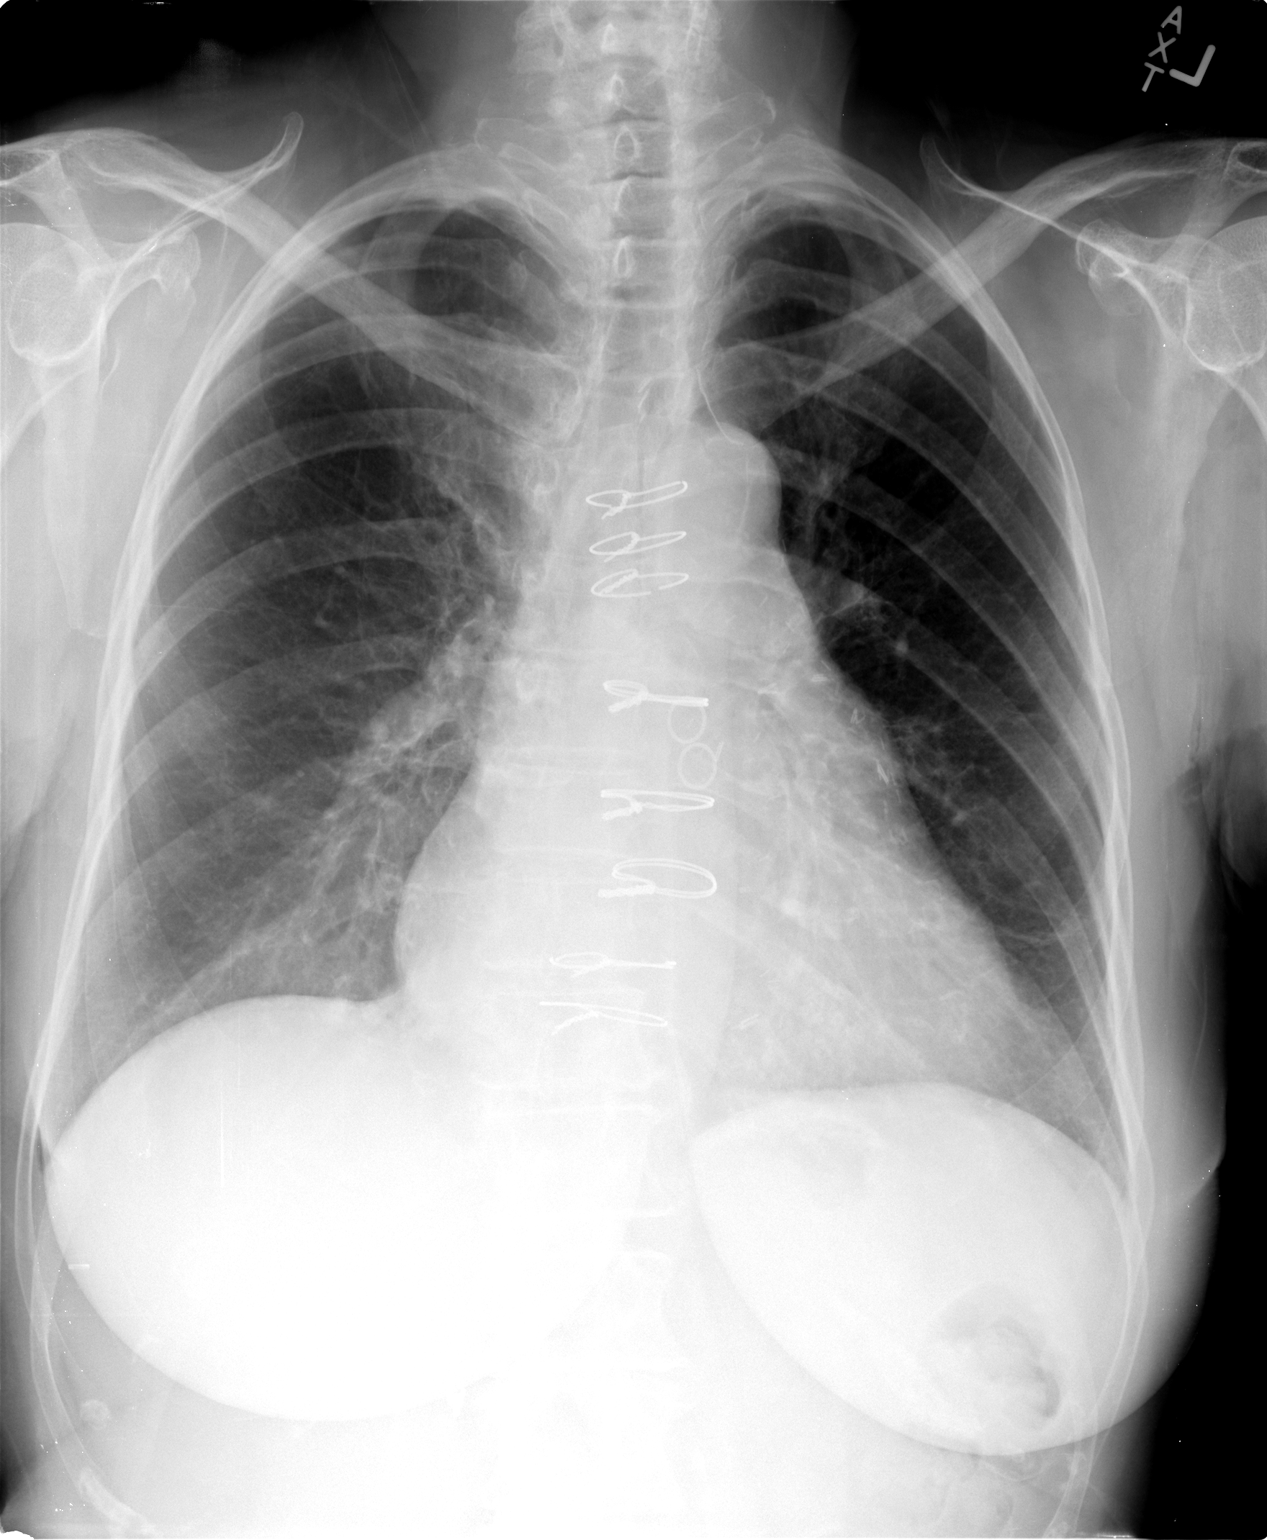

[view not recorded (2 of 2)]
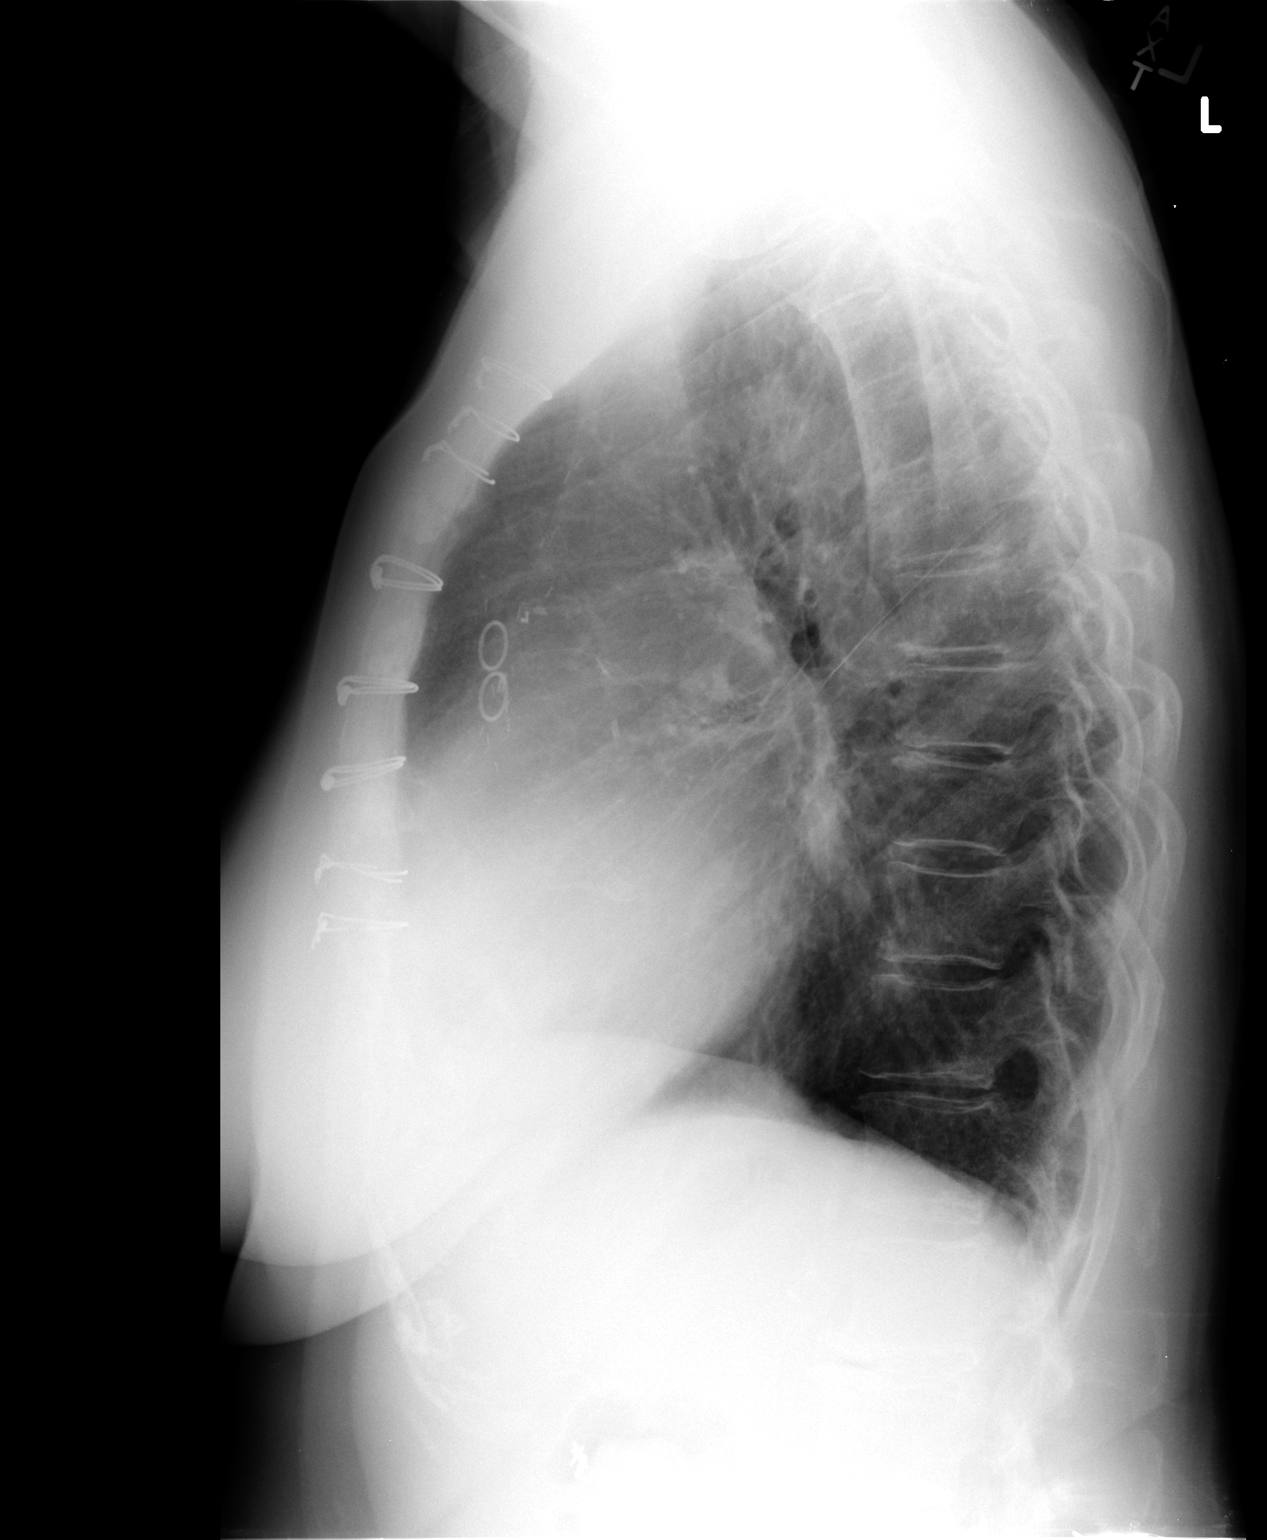

[2 of 2 positions shown; findings below may reference images not displayed]

FINDINGS: Cardiomegaly again noted.  Status post CABG.  No acute
infiltrate or pleural effusion.  No pulmonary edema.  Bony thorax
is stable.
IMPRESSION: No active disease.  No significant change.  Status post CABG again
noted.

## 2013-05-18 ENCOUNTER — Ambulatory Visit (INDEPENDENT_AMBULATORY_CARE_PROVIDER_SITE_OTHER): Payer: Medicare Other | Admitting: *Deleted

## 2013-05-18 DIAGNOSIS — I4891 Unspecified atrial fibrillation: Secondary | ICD-10-CM | POA: Diagnosis not present

## 2013-05-18 DIAGNOSIS — Z7901 Long term (current) use of anticoagulants: Secondary | ICD-10-CM

## 2013-05-18 LAB — POCT INR: INR: 2.2

## 2013-06-15 ENCOUNTER — Ambulatory Visit (INDEPENDENT_AMBULATORY_CARE_PROVIDER_SITE_OTHER): Payer: Medicare Other | Admitting: *Deleted

## 2013-06-15 DIAGNOSIS — I4891 Unspecified atrial fibrillation: Secondary | ICD-10-CM

## 2013-06-15 DIAGNOSIS — Z7901 Long term (current) use of anticoagulants: Secondary | ICD-10-CM | POA: Diagnosis not present

## 2013-06-20 ENCOUNTER — Other Ambulatory Visit: Payer: Self-pay | Admitting: Cardiology

## 2013-07-06 ENCOUNTER — Other Ambulatory Visit: Payer: Self-pay | Admitting: Cardiology

## 2013-07-08 ENCOUNTER — Ambulatory Visit (INDEPENDENT_AMBULATORY_CARE_PROVIDER_SITE_OTHER): Payer: Medicare Other | Admitting: *Deleted

## 2013-07-08 DIAGNOSIS — I4891 Unspecified atrial fibrillation: Secondary | ICD-10-CM

## 2013-07-08 DIAGNOSIS — Z7901 Long term (current) use of anticoagulants: Secondary | ICD-10-CM

## 2013-07-18 ENCOUNTER — Ambulatory Visit (INDEPENDENT_AMBULATORY_CARE_PROVIDER_SITE_OTHER): Payer: Medicare Other | Admitting: *Deleted

## 2013-07-18 DIAGNOSIS — I4891 Unspecified atrial fibrillation: Secondary | ICD-10-CM

## 2013-07-18 DIAGNOSIS — Z7901 Long term (current) use of anticoagulants: Secondary | ICD-10-CM

## 2013-07-25 ENCOUNTER — Other Ambulatory Visit: Payer: Self-pay | Admitting: Cardiology

## 2013-07-29 DIAGNOSIS — Z23 Encounter for immunization: Secondary | ICD-10-CM | POA: Diagnosis not present

## 2013-08-01 ENCOUNTER — Ambulatory Visit (INDEPENDENT_AMBULATORY_CARE_PROVIDER_SITE_OTHER): Payer: Medicare Other | Admitting: *Deleted

## 2013-08-01 DIAGNOSIS — I4891 Unspecified atrial fibrillation: Secondary | ICD-10-CM

## 2013-08-01 DIAGNOSIS — Z7901 Long term (current) use of anticoagulants: Secondary | ICD-10-CM

## 2013-08-09 ENCOUNTER — Other Ambulatory Visit: Payer: Self-pay

## 2013-08-09 ENCOUNTER — Other Ambulatory Visit: Payer: Self-pay | Admitting: *Deleted

## 2013-08-09 MED ORDER — METOPROLOL SUCCINATE ER 25 MG PO TB24: 25.0000 mg | ORAL_TABLET | Freq: Two times a day (BID) | ORAL | Status: DC

## 2013-08-09 NOTE — Addendum Note (Signed)
Addended by: Carmela Hurt on: 08/09/2013 10:43 AM   Modules accepted: Orders

## 2013-08-18 ENCOUNTER — Ambulatory Visit (INDEPENDENT_AMBULATORY_CARE_PROVIDER_SITE_OTHER): Payer: Medicare Other | Admitting: General Practice

## 2013-08-18 ENCOUNTER — Ambulatory Visit (INDEPENDENT_AMBULATORY_CARE_PROVIDER_SITE_OTHER): Payer: Medicare Other | Admitting: Cardiology

## 2013-08-18 ENCOUNTER — Encounter: Payer: Self-pay | Admitting: Cardiology

## 2013-08-18 VITALS — BP 130/90 | HR 56 | Ht 67.0 in | Wt 144.0 lb

## 2013-08-18 DIAGNOSIS — I119 Hypertensive heart disease without heart failure: Secondary | ICD-10-CM | POA: Diagnosis not present

## 2013-08-18 DIAGNOSIS — I341 Nonrheumatic mitral (valve) prolapse: Secondary | ICD-10-CM

## 2013-08-18 DIAGNOSIS — I259 Chronic ischemic heart disease, unspecified: Secondary | ICD-10-CM

## 2013-08-18 DIAGNOSIS — Z7901 Long term (current) use of anticoagulants: Secondary | ICD-10-CM

## 2013-08-18 DIAGNOSIS — I059 Rheumatic mitral valve disease, unspecified: Secondary | ICD-10-CM

## 2013-08-18 DIAGNOSIS — I4891 Unspecified atrial fibrillation: Secondary | ICD-10-CM

## 2013-08-18 DIAGNOSIS — I480 Paroxysmal atrial fibrillation: Secondary | ICD-10-CM | POA: Insufficient documentation

## 2013-08-18 LAB — POCT INR: INR: 2.2

## 2013-08-18 NOTE — Patient Instructions (Signed)
Your physician recommends that you continue on your current medications as directed. Please refer to the Current Medication list given to you today.  Your physician wants you to follow-up in: 4 months with fasting labs (lp/bmet/hfp/tsh/cbc/ekg) You will receive a reminder letter in the mail two months in advance. If you don't receive a letter, please call our office to schedule the follow-up appointment.

## 2013-08-18 NOTE — Progress Notes (Signed)
Natasha Livingston Date of Birth:  [birth year] [address] [place], [place]  [address] [phone]         Fax   [phone]  History of Present Illness: This pleasant 79 year old woman is seen for a followup office visit. She has a history of paroxysmal atrial fibrillation. History of high blood pressure. She does have a history of atypical chest pain and had a Lexus scan Myoview stress test on 12/22/2012 which showed no evidence of ischemia and showed a small scar on the lateral wall. The study was not gated because of her atrial fibrillation. She has had remote CABG on 04/12/2007 by Dr. Maren Livingston. She has a past history of mitral regurgitation secondary to mitral valve prolapse. She also has a history of hypothyroidism and a history of essential hypertension.   Her last echocardiogram on 07/13/12 showed an ejection fraction of 40% and mild mitral regurgitation.  She previously had been on carvedilol but had inadequate rate control.  At her last visit we switched her to metoprolol and she has felt much better since then.  When last seen in April 2014 she was in atrial fibrillation.  Today she returns and is in normal sinus rhythm.  Current Outpatient Prescriptions  Medication Sig Dispense Refill  . ALPRAZolam (XANAX) 0.25 MG tablet TAKE 1/2 TABLET BY MOUTH 2 TIMES A DAY  30 tablet  5  . aspirin 81 MG tablet Take 81 mg by mouth daily.        . DiphenhydrAMINE HCl (BENADRYL ALLERGY PO) Take by mouth every 6 (six) hours.      . furosemide (LASIX) 40 MG tablet Take 1 tablet (40 mg total) by mouth daily.  30 tablet  5  . gabapentin (NEURONTIN) 800 MG tablet 1/2 tablet in the morning and 1 tablet in the evening  135 tablet  3  . levothyroxine (SYNTHROID, LEVOTHROID) 25 MCG tablet TAKE 1 TABLET BY MOUTH EVERY DAY  30 tablet  11  . lisinopril (PRINIVIL,ZESTRIL) 10 MG tablet TAKE 1 TABLET BY MOUTH EVERY DAY  30 tablet  5  . metoprolol succinate (TOPROL XL) 25 MG 24 hr tablet Take 1  tablet (25 mg total) by mouth 2 (two) times daily.  180 tablet  3  . potassium chloride SA (K-DUR,KLOR-CON) 20 MEQ tablet Take 1 tablet (20 mEq total) by mouth 2 (two) times daily.  60 tablet  11  . PROCTOSOL HC 2.5 % rectal cream USE AS DIRECTED  28 g  2  . warfarin (COUMADIN) 5 MG tablet 1 daily or as directed  90 tablet  3   No current facility-administered medications for this visit.    Allergies  Allergen Reactions  . Amiodarone   . Crestor [Rosuvastatin Calcium]   . Lipitor [Atorvastatin Calcium]     Patient Active Problem List   Diagnosis Date Noted  . Edema of foot 04/28/2011    Priority: High  . Malaise and fatigue 04/15/2011    Priority: High  . MVP (mitral valve prolapse)     Priority: High  . Atrial fibrillation 02/03/2011    Priority: High  . DOE (dyspnea on exertion)     Priority: Medium  . Paroxysmal atrial fibrillation 08/18/2013  . Hypothyroid 04/20/2013  . Dermatitis 12/31/2012  . Benign hypertensive heart disease without heart failure 12/24/2011  . Encounter for long-term (current) use of anticoagulants 08/27/2011  . Chest discomfort   . Dizziness   . Bruises easily   . Back pain   .  Forgetfulness   . Atrial fibrillation   . Hypertension   . Hypothyroidism   . Aortic stenosis   . Hyperlipidemia   . Diverticulitis   . Interstitial nephritis   . Neuropathy   . Ischemic heart disease     History  Smoking status  . Never Smoker   Smokeless tobacco  . Not on file    History  Alcohol Use No    Family History  Problem Relation Age of Onset  . Cerebral aneurysm    . Cerebral aneurysm Father     Review of Systems: Constitutional: no fever chills diaphoresis or fatigue or change in weight.  Head and neck: no hearing loss, no epistaxis, no photophobia or visual disturbance. Respiratory: No cough, shortness of breath or wheezing. Cardiovascular: No chest pain peripheral edema, palpitations. Gastrointestinal: No abdominal distention, no  abdominal pain, no change in bowel habits hematochezia or melena. Genitourinary: No dysuria, no frequency, no urgency, no nocturia. Musculoskeletal:No arthralgias, no back pain, no gait disturbance or myalgias. Neurological: No dizziness, no headaches, no numbness, no seizures, no syncope, no weakness, no tremors. Hematologic: No lymphadenopathy, no easy bruising. Psychiatric: No confusion, no hallucinations, no sleep disturbance.    Physical Exam: Filed Vitals:   08/18/13 1043  BP: 130/90  Pulse: 56   the general appearance reveals a pleasant elderly woman in no distress.The head and neck exam reveals pupils equal and reactive.  Extraocular movements are full.  There is no scleral icterus.  The mouth and pharynx are normal.  The neck is supple.  The carotids reveal no bruits.  The jugular venous pressure is normal.  The  thyroid is not enlarged.  There is no lymphadenopathy.  The chest is clear to percussion and auscultation.  There are no rales or rhonchi.  Expansion of the chest is symmetrical.  The precordium is quiet.  The pulse is irregularly irregular  The first heart sound is normal.  The second heart sound is physiologically split.  There is no  gallop rub or click.  There is a soft systolic murmur. There is no abnormal lift or heave.  The abdomen is soft and nontender.  The bowel sounds are normal.  The liver and spleen are not enlarged.  There are no abdominal masses.  There are no abdominal bruits.  Extremities reveal good pedal pulses.  There is no phlebitis or edema.  There is no cyanosis or clubbing.  Strength is normal and symmetrical in all extremities.  There is no lateralizing weakness.  There are no sensory deficits.  The skin is warm and dry.  There is no rash.  EKG today shows normal sinus rhythm with occasional PVC.  Since 02/14/13, atrial fibrillation has been replaced with normal sinus rhythm.   Assessment / Plan: Continue same medication.  Recheck in 4 months for office  visit EKG CBC TSH fasting lipid panel hepatic function panel and basal metabolic panel.

## 2013-08-18 NOTE — Assessment & Plan Note (Signed)
The patient has not been experiencing any symptoms of increased congestive heart failure or increased dyspnea.

## 2013-08-18 NOTE — Assessment & Plan Note (Signed)
EKG today shows normal sinus rhythm.  She has not been aware of any recent racing of her heart or palpitations.  She remains on long-term anticoagulation.  She has not had any TIA symptoms.

## 2013-08-18 NOTE — Assessment & Plan Note (Signed)
Blood pressure has been remaining stable on current therapy.  She has been under a lot of stress.  Her husband is home and hospice is now seeing him.

## 2013-08-22 ENCOUNTER — Other Ambulatory Visit: Payer: Self-pay | Admitting: Cardiology

## 2013-08-24 ENCOUNTER — Other Ambulatory Visit: Payer: Self-pay | Admitting: *Deleted

## 2013-08-24 MED ORDER — HYDROCORTISONE 2.5 % RE CREA: TOPICAL_CREAM | RECTAL | Status: DC | PRN

## 2013-09-06 ENCOUNTER — Other Ambulatory Visit: Payer: Self-pay | Admitting: Cardiology

## 2013-09-08 ENCOUNTER — Ambulatory Visit (INDEPENDENT_AMBULATORY_CARE_PROVIDER_SITE_OTHER): Payer: Medicare Other | Admitting: Pharmacist

## 2013-09-08 DIAGNOSIS — Z7901 Long term (current) use of anticoagulants: Secondary | ICD-10-CM

## 2013-09-08 DIAGNOSIS — I4891 Unspecified atrial fibrillation: Secondary | ICD-10-CM

## 2013-10-10 ENCOUNTER — Ambulatory Visit (INDEPENDENT_AMBULATORY_CARE_PROVIDER_SITE_OTHER): Payer: Medicare Other | Admitting: Pharmacist

## 2013-10-10 DIAGNOSIS — Z7901 Long term (current) use of anticoagulants: Secondary | ICD-10-CM | POA: Diagnosis not present

## 2013-10-10 DIAGNOSIS — I4891 Unspecified atrial fibrillation: Secondary | ICD-10-CM | POA: Diagnosis not present

## 2013-10-10 LAB — POCT INR: INR: 2.1

## 2013-10-17 ENCOUNTER — Other Ambulatory Visit: Payer: Self-pay | Admitting: Cardiology

## 2013-11-07 ENCOUNTER — Ambulatory Visit (INDEPENDENT_AMBULATORY_CARE_PROVIDER_SITE_OTHER): Payer: Medicare Other | Admitting: *Deleted

## 2013-11-07 DIAGNOSIS — Z7901 Long term (current) use of anticoagulants: Secondary | ICD-10-CM

## 2013-11-07 DIAGNOSIS — I4891 Unspecified atrial fibrillation: Secondary | ICD-10-CM | POA: Diagnosis not present

## 2013-11-07 LAB — POCT INR: INR: 2.4

## 2013-12-07 ENCOUNTER — Encounter (INDEPENDENT_AMBULATORY_CARE_PROVIDER_SITE_OTHER): Payer: Self-pay

## 2013-12-07 ENCOUNTER — Ambulatory Visit (INDEPENDENT_AMBULATORY_CARE_PROVIDER_SITE_OTHER): Payer: Medicare Other | Admitting: Cardiology

## 2013-12-07 ENCOUNTER — Ambulatory Visit (INDEPENDENT_AMBULATORY_CARE_PROVIDER_SITE_OTHER): Payer: Medicare Other | Admitting: Pharmacist

## 2013-12-07 ENCOUNTER — Encounter: Payer: Self-pay | Admitting: Cardiology

## 2013-12-07 VITALS — BP 148/90 | HR 83 | Ht 67.0 in | Wt 154.0 lb

## 2013-12-07 DIAGNOSIS — F3289 Other specified depressive episodes: Secondary | ICD-10-CM

## 2013-12-07 DIAGNOSIS — R4589 Other symptoms and signs involving emotional state: Secondary | ICD-10-CM

## 2013-12-07 DIAGNOSIS — F341 Dysthymic disorder: Secondary | ICD-10-CM | POA: Diagnosis not present

## 2013-12-07 DIAGNOSIS — Z5181 Encounter for therapeutic drug level monitoring: Secondary | ICD-10-CM

## 2013-12-07 DIAGNOSIS — I4891 Unspecified atrial fibrillation: Secondary | ICD-10-CM

## 2013-12-07 DIAGNOSIS — I259 Chronic ischemic heart disease, unspecified: Secondary | ICD-10-CM

## 2013-12-07 DIAGNOSIS — R0989 Other specified symptoms and signs involving the circulatory and respiratory systems: Secondary | ICD-10-CM

## 2013-12-07 DIAGNOSIS — Z7901 Long term (current) use of anticoagulants: Secondary | ICD-10-CM | POA: Diagnosis not present

## 2013-12-07 DIAGNOSIS — F32A Depression, unspecified: Secondary | ICD-10-CM

## 2013-12-07 DIAGNOSIS — I480 Paroxysmal atrial fibrillation: Secondary | ICD-10-CM

## 2013-12-07 DIAGNOSIS — I119 Hypertensive heart disease without heart failure: Secondary | ICD-10-CM

## 2013-12-07 DIAGNOSIS — F329 Major depressive disorder, single episode, unspecified: Secondary | ICD-10-CM

## 2013-12-07 DIAGNOSIS — R0609 Other forms of dyspnea: Secondary | ICD-10-CM

## 2013-12-07 LAB — POCT INR: INR: 2.6

## 2013-12-07 MED ORDER — TRAZODONE HCL 50 MG PO TABS: ORAL_TABLET | ORAL | Status: DC

## 2013-12-07 MED ORDER — METOPROLOL SUCCINATE ER 50 MG PO TB24: ORAL_TABLET | ORAL | Status: DC

## 2013-12-07 MED ORDER — FUROSEMIDE 40 MG PO TABS: 40.0000 mg | ORAL_TABLET | Freq: Every day | ORAL | Status: DC

## 2013-12-07 NOTE — Assessment & Plan Note (Signed)
The patient is in atrial fibrillation with a rate of 83.  She is feeling her heart pounding.  She is aware of it racing at times.  We are going to increase the dose of Toprol-XL up to 50 mg in the morning and 25 mg in the evening.

## 2013-12-07 NOTE — Assessment & Plan Note (Signed)
The patient has not been experiencing any recurrent chest pain or angina. 

## 2013-12-07 NOTE — Patient Instructions (Signed)
INCREASE YOUR TOPROL (METOPROLOL) TO 50 MG 1 IN THE MORNING AND 1/2 IN THE EVENING  START TRAZODONE 25 MG AT BEDTIME AS NEEDED  Your physician wants you to follow-up in: [place] will receive a reminder letter in the mail two months in advance. If you don't receive a letter, please call our office to schedule the follow-up appointment.

## 2013-12-07 NOTE — Assessment & Plan Note (Signed)
The patient has had more depression symptoms recently.  Her husband died in August 30, 2023 after a long illness.  The patient has been feeling more anxious.  She has had some sleep disturbance.  Her son also thinks that she has seasonal affective disorder in the winter.  To help her we will give her low-dose trazodone 25 mg at at bedtime.

## 2013-12-07 NOTE — Assessment & Plan Note (Signed)
Blood pressure is borderline high.  We will increase her Toprol for her atrial fibrillation and should also help her daytime blood pressure.

## 2013-12-07 NOTE — Progress Notes (Signed)
Natasha Livingston Date of Birth:  27-Sep-1934 [address] [place], [place]  [address] [phone]         Fax   [phone]  History of Present Illness: This pleasant 80 year old woman is seen for a followup office visit. She has a history of paroxysmal atrial fibrillation. History of high blood pressure. She does have a history of atypical chest pain and had a Lexus scan Myoview stress test on 12/22/2012 which showed no evidence of ischemia and showed a small scar on the lateral wall. The study was not gated because of her atrial fibrillation. She has had remote CABG on 04/12/2007 by Dr. Darcey Nora. She has a past history of mitral regurgitation secondary to mitral valve prolapse. She also has a history of hypothyroidism and a history of essential hypertension.   Her last echocardiogram on 07/13/12 showed an ejection fraction of 40% and mild mitral regurgitation.  She previously had been on carvedilol but had inadequate rate control.  At her last visit we switched her to metoprolol and she has felt much better since then.    When last seen 4 months ago on 08/18/13 the patient was unexpectedly found to be back in normal sinus rhythm.  However today she is back in atrial fibrillation.  Current Outpatient Prescriptions  Medication Sig Dispense Refill  . ALPRAZolam (XANAX) 0.25 MG tablet TAKE 1/2 TABLET BY MOUTH 2 TIMES A DAY  30 tablet  5  . aspirin 81 MG tablet Take 81 mg by mouth daily.        . DiphenhydrAMINE HCl (BENADRYL ALLERGY PO) Take by mouth every 6 (six) hours.      . furosemide (LASIX) 40 MG tablet Take 1 tablet (40 mg total) by mouth daily.  30 tablet  5  . gabapentin (NEURONTIN) 800 MG tablet 1/2 tablet in the morning and 1 tablet in the evening  135 tablet  3  . hydrocortisone (PROCTOSOL HC) 2.5 % rectal cream Place rectally as needed for hemorrhoids.  28 g  2  . levothyroxine (SYNTHROID, LEVOTHROID) 25 MCG tablet TAKE 1 TABLET BY MOUTH EVERY DAY  30 tablet  11  .  lisinopril (PRINIVIL,ZESTRIL) 10 MG tablet TAKE 1 TABLET BY MOUTH EVERY DAY  30 tablet  5  . metoprolol succinate (TOPROL XL) 50 MG 24 hr tablet 1 TABLET IN THE MORNING AND 1/2 IN THE EVENING  180 tablet  3  . potassium chloride SA (K-DUR,KLOR-CON) 20 MEQ tablet Take 1 tablet (20 mEq total) by mouth 2 (two) times daily.  60 tablet  11  . warfarin (COUMADIN) 5 MG tablet TAKE 1 TABLET BY MOUTH EVERY DAY OR AS DIRECTED  90 tablet  1  . traZODone (DESYREL) 50 MG tablet 1/2 TABLET DAILY AT BEDTIME       No current facility-administered medications for this visit.    Allergies  Allergen Reactions  . Amiodarone   . Crestor [Rosuvastatin Calcium]   . Lipitor [Atorvastatin Calcium]     Patient Active Problem List   Diagnosis Date Noted  . Edema of foot 04/28/2011    Priority: High  . Malaise and fatigue 04/15/2011    Priority: High  . MVP (mitral valve prolapse)     Priority: High  . Atrial fibrillation 02/03/2011    Priority: High  . DOE (dyspnea on exertion)     Priority: Medium  . Encounter for therapeutic drug monitoring 12/07/2013  . Paroxysmal atrial fibrillation 08/18/2013  . Hypothyroid 04/20/2013  .  Dermatitis 12/31/2012  . Benign hypertensive heart disease without heart failure 12/24/2011  . Encounter for long-term (current) use of anticoagulants 08/27/2011  . Chest discomfort   . Dizziness   . Bruises easily   . Back pain   . Forgetfulness   . Atrial fibrillation   . Hypertension   . Hypothyroidism   . Aortic stenosis   . Hyperlipidemia   . Diverticulitis   . Interstitial nephritis   . Neuropathy   . Ischemic heart disease     History  Smoking status  . Never Smoker   Smokeless tobacco  . Not on file    History  Alcohol Use No    Family History  Problem Relation Age of Onset  . Cerebral aneurysm    . Cerebral aneurysm Father     Review of Systems: Constitutional: no fever chills diaphoresis or fatigue or change in weight.  Head and neck: no  hearing loss, no epistaxis, no photophobia or visual disturbance. Respiratory: No cough, shortness of breath or wheezing. Cardiovascular: No chest pain peripheral edema, palpitations. Gastrointestinal: No abdominal distention, no abdominal pain, no change in bowel habits hematochezia or melena. Genitourinary: No dysuria, no frequency, no urgency, no nocturia. Musculoskeletal:No arthralgias, no back pain, no gait disturbance or myalgias. Neurological: No dizziness, no headaches, no numbness, no seizures, no syncope, no weakness, no tremors. Hematologic: No lymphadenopathy, no easy bruising. Psychiatric: No confusion, no hallucinations, no sleep disturbance.    Physical Exam: Filed Vitals:   12/07/13 1008  BP: 148/90  Pulse: 83   the general appearance reveals a pleasant elderly woman in no distress.The head and neck exam reveals pupils equal and reactive.  Extraocular movements are full.  There is no scleral icterus.  The mouth and pharynx are normal.  The neck is supple.  The carotids reveal no bruits.  The jugular venous pressure is normal.  The  thyroid is not enlarged.  There is no lymphadenopathy.  The chest is clear to percussion and auscultation.  There are no rales or rhonchi.  Expansion of the chest is symmetrical.  The precordium is quiet.  The pulse is irregularly irregular  The first heart sound is normal.  The second heart sound is physiologically split.  There is no  gallop rub or click.  There is a soft systolic murmur. There is no abnormal lift or heave.  The abdomen is soft and nontender.  The bowel sounds are normal.  The liver and spleen are not enlarged.  There are no abdominal masses.  There are no abdominal bruits.  Extremities reveal good pedal pulses.  There is no phlebitis or edema.  There is no cyanosis or clubbing.  Strength is normal and symmetrical in all extremities.  There is no lateralizing weakness.  There are no sensory deficits.  The skin is warm and dry.  There is  no rash.  EKG shows atrial fibrillation with controlled ventricular response.  Since 08/18/13, normal sinus rhythm is no longer present   Assessment / Plan: Increase Toprol-XL to 50 mg in the morning and 25 mg in the evening. 4 situational depression and seasonal affective disorder and sleep disturbance we will add trazodone 25 mg at bedtime. Recheck in 3 months for followup office visit and EKG.  The patient is allergic to amiodarone.  She thinks that it caused a rash.

## 2013-12-12 ENCOUNTER — Telehealth: Payer: Self-pay | Admitting: Cardiology

## 2013-12-12 NOTE — Telephone Encounter (Signed)
Patient called to discuss her Trazodone and and Xanax and advised not to take together at not. Patient does want all new Rx's sent to her for her to mail to [place]. Will print out and have  Dr. Mare Ferrari sign

## 2013-12-12 NOTE — Telephone Encounter (Signed)
Clarified medications with son.

## 2013-12-12 NOTE — Telephone Encounter (Signed)
New message   Patient calling did not want to disclose any information stated she needs to speak with [facility].

## 2013-12-12 NOTE — Telephone Encounter (Signed)
Follow up    Talk to the nurse that called his mother this am.  Pt is confused and do not understand what the nurse said about taking medication to help her relax

## 2013-12-16 DIAGNOSIS — H259 Unspecified age-related cataract: Secondary | ICD-10-CM | POA: Diagnosis not present

## 2013-12-16 DIAGNOSIS — H04129 Dry eye syndrome of unspecified lacrimal gland: Secondary | ICD-10-CM | POA: Diagnosis not present

## 2013-12-16 DIAGNOSIS — H52209 Unspecified astigmatism, unspecified eye: Secondary | ICD-10-CM | POA: Diagnosis not present

## 2013-12-16 DIAGNOSIS — H5200 Hypermetropia, unspecified eye: Secondary | ICD-10-CM | POA: Diagnosis not present

## 2013-12-29 ENCOUNTER — Emergency Department (HOSPITAL_COMMUNITY)
Admission: EM | Admit: 2013-12-29 | Discharge: 2013-12-29 | Disposition: A | Discharge status: Home / Self Care | Payer: Medicare Other | Attending: Emergency Medicine | Admitting: Emergency Medicine

## 2013-12-29 ENCOUNTER — Encounter (HOSPITAL_COMMUNITY): Payer: Self-pay | Admitting: Emergency Medicine

## 2013-12-29 DIAGNOSIS — R059 Cough, unspecified: Secondary | ICD-10-CM | POA: Insufficient documentation

## 2013-12-29 DIAGNOSIS — I10 Essential (primary) hypertension: Secondary | ICD-10-CM | POA: Diagnosis not present

## 2013-12-29 DIAGNOSIS — Z8739 Personal history of other diseases of the musculoskeletal system and connective tissue: Secondary | ICD-10-CM | POA: Diagnosis not present

## 2013-12-29 DIAGNOSIS — Z79899 Other long term (current) drug therapy: Secondary | ICD-10-CM | POA: Diagnosis not present

## 2013-12-29 DIAGNOSIS — I059 Rheumatic mitral valve disease, unspecified: Secondary | ICD-10-CM | POA: Diagnosis not present

## 2013-12-29 DIAGNOSIS — R040 Epistaxis: Secondary | ICD-10-CM | POA: Diagnosis not present

## 2013-12-29 DIAGNOSIS — Z8669 Personal history of other diseases of the nervous system and sense organs: Secondary | ICD-10-CM | POA: Insufficient documentation

## 2013-12-29 DIAGNOSIS — R05 Cough: Secondary | ICD-10-CM | POA: Insufficient documentation

## 2013-12-29 DIAGNOSIS — Z951 Presence of aortocoronary bypass graft: Secondary | ICD-10-CM | POA: Insufficient documentation

## 2013-12-29 DIAGNOSIS — Z8719 Personal history of other diseases of the digestive system: Secondary | ICD-10-CM | POA: Diagnosis not present

## 2013-12-29 DIAGNOSIS — Z87448 Personal history of other diseases of urinary system: Secondary | ICD-10-CM | POA: Diagnosis not present

## 2013-12-29 DIAGNOSIS — Z7901 Long term (current) use of anticoagulants: Secondary | ICD-10-CM

## 2013-12-29 DIAGNOSIS — E039 Hypothyroidism, unspecified: Secondary | ICD-10-CM | POA: Diagnosis not present

## 2013-12-29 DIAGNOSIS — I4891 Unspecified atrial fibrillation: Secondary | ICD-10-CM | POA: Insufficient documentation

## 2013-12-29 DIAGNOSIS — R6889 Other general symptoms and signs: Secondary | ICD-10-CM | POA: Diagnosis not present

## 2013-12-29 DIAGNOSIS — R791 Abnormal coagulation profile: Secondary | ICD-10-CM | POA: Diagnosis not present

## 2013-12-29 LAB — CBC
HCT: 35.8 % — ABNORMAL LOW (ref 36.0–46.0)
Hemoglobin: 12 g/dL (ref 12.0–15.0)
MCH: 32.4 pg (ref 26.0–34.0)
MCHC: 33.5 g/dL (ref 30.0–36.0)
MCV: 96.8 fL (ref 78.0–100.0)
PLATELETS: 239 10*3/uL (ref 150–400)
RBC: 3.7 MIL/uL — AB (ref 3.87–5.11)
RDW: 13.8 % (ref 11.5–15.5)
WBC: 11.8 10*3/uL — AB (ref 4.0–10.5)

## 2013-12-29 LAB — PROTIME-INR
INR: 2.55 — ABNORMAL HIGH (ref 0.00–1.49)
PROTHROMBIN TIME: 26.6 s — AB (ref 11.6–15.2)

## 2013-12-29 MED ORDER — PHYTONADIONE 5 MG PO TABS
2.5000 mg | ORAL_TABLET | Freq: Once | ORAL | Status: AC
  Administered 2013-12-29: 2.5 mg via ORAL
  Filled 2013-12-29: qty 1

## 2013-12-29 MED ORDER — ONDANSETRON HCL 4 MG/2ML IJ SOLN
4.0000 mg | Freq: Once | INTRAMUSCULAR | Status: AC
  Administered 2013-12-29: 4 mg via INTRAVENOUS
  Filled 2013-12-29: qty 2

## 2013-12-29 NOTE — Discharge Instructions (Signed)
Hold coumadin dose for one day. Have a repeat INR Friday and see your physician for recheck before the weekend. If bleeding resumes hold constant pressure for 15 minutes at a time. If not stopped after doing this three times or you become lightheaded come to the ER.  If you were given medicines take as directed.  If you are on coumadin or contraceptives realize their levels and effectiveness is altered by many different medicines.  If you have any reaction (rash, tongues swelling, other) to the medicines stop taking and see a physician.   Please follow up as directed and return to the ER or see a physician for new or worsening symptoms.  Thank you.

## 2013-12-29 NOTE — ED Notes (Signed)
Per EMS pt presents to the department because she had a nosebleed that lasted about three hours, pt called EMS when she was unable to stop the bleeding out of her left nare. EMS reports they adm two sprays of Afrin and it initially stopped the bleeding. EMS reports the pt has had intermittent bleeding en route to the department. Pt reports she is coughing up clots and had large clots come out of her left nare. EMS reports pt has a hx of Afib and HTN. EMS reports pts VS were WNL. Pt is A&O X4. Pt denies dizziness, HA, N/V. Pt states she is exhausted. Pt is on coumadin.

## 2013-12-29 NOTE — ED Notes (Signed)
MD at bedside. 

## 2013-12-29 NOTE — ED Provider Notes (Signed)
CSN: [id number]     Arrival date & time 12/29/13  0233 History   First MD Initiated Contact with Patient 12/29/13 [phone]     Chief Complaint  Patient presents with  . Epistaxis     (Consider location/radiation/quality/duration/timing/severity/associated sxs/prior Treatment) HPI Comments: 80 yo female with a fib, mvp, AS hx presents with epistaxis left nare intermittent since this evening.  No injury.  Pt on coumadin.  No lightheaded or syncope.  Patient is a 80 y.o. female presenting with nosebleeds. The history is provided by the patient.  Epistaxis Location:  L nare Severity:  Moderate Timing:  Intermittent Progression:  Waxing and waning Chronicity:  New Context: anticoagulants   Associated symptoms: blood in oropharynx and cough   Associated symptoms: no fever and no headaches     Past Medical History  Diagnosis Date  . Chest discomfort   . Dizziness   . MVP (mitral valve prolapse)   . DOE (dyspnea on exertion)   . Bruises easily   . Back pain   . Forgetfulness   . Atrial fibrillation   . Hypertension   . Hypothyroidism   . Aortic stenosis   . Hyperlipidemia   . Diverticulitis   . Interstitial nephritis   . Neuropathy   . Ischemic heart disease    Past Surgical History  Procedure Laterality Date  . Coronary artery bypass graft  04/2007  . Cardioversion  08/2008  . Ovarian cyst removal     Family History  Problem Relation Age of Onset  . Cerebral aneurysm    . Cerebral aneurysm Father    History  Substance Use Topics  . Smoking status: Never Smoker   . Smokeless tobacco: Not on file  . Alcohol Use: No   OB History   Grav Para Term Preterm Abortions TAB SAB Ect Mult Living                 Review of Systems  Constitutional: Negative for fever.  HENT: Positive for nosebleeds.   Respiratory: Positive for cough. Negative for shortness of breath.   Cardiovascular: Negative for chest pain.  Gastrointestinal: Negative for vomiting and abdominal pain.   Neurological: Negative for syncope, light-headedness and headaches.      Allergies  Amiodarone; Crestor; and Lipitor  Home Medications   Current Outpatient Rx  Name  Route  Sig  Dispense  Refill  . ALPRAZolam (XANAX) 0.25 MG tablet   Oral   Take 0.125 mg by mouth 2 (two) times daily.         . furosemide (LASIX) 40 MG tablet   Oral   Take 1 tablet (40 mg total) by mouth daily.   30 tablet   5   . gabapentin (NEURONTIN) 800 MG tablet   Oral   Take 400 mg by mouth 2 (two) times daily.          . hydrocortisone (ANUSOL-HC) 2.5 % rectal cream   Rectal   Place 1 application rectally 2 (two) times daily as needed for hemorrhoids.         Marland Kitchen levothyroxine (SYNTHROID, LEVOTHROID) 25 MCG tablet   Oral   Take 25 mcg by mouth daily before breakfast.         . lisinopril (PRINIVIL,ZESTRIL) 10 MG tablet   Oral   Take 10 mg by mouth daily.         . metoprolol succinate (TOPROL-XL) 25 MG 24 hr tablet   Oral   Take 25-50 mg by mouth 2 (  two) times daily. Take 50mg  in the morning and 25mg  in the evening.         . Oxymetazoline HCl (AFRIN NASAL SPRAY NA)   Left Nare   Place 2 sprays into left nostril daily as needed (nose bleed).         . potassium chloride SA (K-DUR,KLOR-CON) 20 MEQ tablet   Oral   Take 1 tablet (20 mEq total) by mouth 2 (two) times daily.   60 tablet   11     New dose   . traZODone (DESYREL) 50 MG tablet   Oral   Take 25 mg by mouth at bedtime as needed for sleep.          Marland Kitchen warfarin (COUMADIN) 5 MG tablet   Oral   Take 2.5-5 mg by mouth daily. Take 2.5mg  on Tuesday and Friday. All other days take 5mg           BP 143/82  Temp(Src) 97.6 F (36.4 C) (Oral)  Resp 18  Ht 5\' 7"  (1.702 m)  Wt 150 lb (68.04 kg)  BMI 23.49 kg/m2  SpO2 96% Physical Exam  Nursing note and vitals reviewed. Constitutional: She is oriented to person, place, and time. She appears well-developed and well-nourished.  HENT:  Head: Normocephalic.  Mild  bleeding from left nare Focal area anterior medial aspect left nare Few clots pt spitting up  Eyes: Conjunctivae are normal. Right eye exhibits no discharge. Left eye exhibits no discharge.  Neck: Normal range of motion. Neck supple. No tracheal deviation present.  Cardiovascular: Normal rate.  An irregularly irregular rhythm present.  Pulmonary/Chest: Effort normal.  Musculoskeletal: She exhibits no edema.  Neurological: She is alert and oriented to person, place, and time.  Skin: Skin is warm. No rash noted.  Psychiatric: She has a normal mood and affect.    ED Course  Procedures (including critical care time) Epistaxis Bleeding control/ cautery  Silver nitrate stick Left nare, focal bleeding at anterior medial aspect Bleeding controlled after No complications Labs Review Labs Reviewed  CBC - Abnormal; Notable for the following:    WBC 11.8 (*)    RBC 3.70 (*)    HCT 35.8 (*)    All other components within normal limits  PROTIME-INR - Abnormal; Notable for the following:    Prothrombin Time 26.6 (*)    INR 2.55 (*)    All other components within normal limits   Imaging Review No results found.  EKG Interpretation   None       MDM   Final diagnoses:  Epistaxis  Atrial fibrillation  Anticoagulated on Coumadin   Very mild intermittent bleeding in ED, has improved since Afrin.  Silver nitrate used on focal anterior bleeding site. Vitamin K 2.5 mg po, pt on coumadin for a fib. Observed in ED.  Bleeding controlled, Hb okay. Recheck, no bleeding.  Close fup outpt for INR recheck.  Results and differential diagnosis were discussed with the patient. Close follow up outpatient was discussed, patient comfortable with the plan.       Mariea Clonts, MD 12/29/13 [phone]

## 2013-12-30 ENCOUNTER — Telehealth: Payer: Self-pay | Admitting: Cardiology

## 2013-12-30 NOTE — Telephone Encounter (Signed)
Telephoned [facility] and spoke with him in regards to pt's nosebleed and Vitamin K given in ER yesterday and orders were given to hold dose last night. INR in ER was 2.55, thus per pharmacist boost pt's coumadin dose and see her next week in office.

## 2013-12-30 NOTE — Telephone Encounter (Signed)
New message    C/o nosebleed wed night--EMT could not get it to stop--went to ER.  Pt is on coumadin.  Pt got vit K at ER and was told to skip thurs coumadin. INR was 2-5 at the ER.  Should she come here before her next due date to get her coumadin checked?

## 2014-01-06 ENCOUNTER — Ambulatory Visit (INDEPENDENT_AMBULATORY_CARE_PROVIDER_SITE_OTHER): Payer: Medicare Other | Admitting: Pharmacist

## 2014-01-06 DIAGNOSIS — Z5181 Encounter for therapeutic drug level monitoring: Secondary | ICD-10-CM | POA: Diagnosis not present

## 2014-01-06 DIAGNOSIS — Z7901 Long term (current) use of anticoagulants: Secondary | ICD-10-CM | POA: Diagnosis not present

## 2014-01-06 DIAGNOSIS — I4891 Unspecified atrial fibrillation: Secondary | ICD-10-CM

## 2014-01-06 LAB — POCT INR: INR: 2.9

## 2014-01-10 ENCOUNTER — Other Ambulatory Visit: Payer: Self-pay | Admitting: Cardiology

## 2014-01-16 ENCOUNTER — Other Ambulatory Visit: Payer: Self-pay | Admitting: Cardiology

## 2014-01-18 ENCOUNTER — Other Ambulatory Visit: Payer: Self-pay | Admitting: Cardiology

## 2014-01-20 ENCOUNTER — Telehealth: Payer: Self-pay

## 2014-01-20 NOTE — Telephone Encounter (Signed)
Left message to call back  

## 2014-01-20 NOTE — Telephone Encounter (Signed)
Okay to refill gabapentin

## 2014-01-23 NOTE — Telephone Encounter (Signed)
Per patient she does not need at this time

## 2014-01-27 MED ORDER — LISINOPRIL 10 MG PO TABS: 10.0000 mg | ORAL_TABLET | Freq: Every day | ORAL | Status: DC

## 2014-01-27 MED ORDER — POTASSIUM CHLORIDE CRYS ER 20 MEQ PO TBCR: 20.0000 meq | EXTENDED_RELEASE_TABLET | Freq: Two times a day (BID) | ORAL | Status: DC

## 2014-01-27 MED ORDER — GABAPENTIN 800 MG PO TABS: ORAL_TABLET | ORAL | Status: DC

## 2014-01-27 NOTE — Telephone Encounter (Signed)
Patient requested Rx be mailed. Printed and mailed to patient (had to cancel Rx for Gabapentin at [facility] and print, sent to them in error)

## 2014-01-30 ENCOUNTER — Telehealth: Payer: Self-pay | Admitting: *Deleted

## 2014-01-30 MED ORDER — METOPROLOL SUCCINATE ER 25 MG PO TB24: ORAL_TABLET | ORAL | Status: DC

## 2014-01-30 NOTE — Telephone Encounter (Signed)
Son called for refill

## 2014-02-01 ENCOUNTER — Telehealth: Payer: Self-pay | Admitting: *Deleted

## 2014-02-01 NOTE — Telephone Encounter (Signed)
Patient has had shortness of breath with exertion for since her last ov in February,denies any swelling. Scheduled ov with  Dr. Mare Ferrari 02/13/14. Advised to call back if worse.

## 2014-02-13 ENCOUNTER — Other Ambulatory Visit: Payer: Self-pay | Admitting: Cardiology

## 2014-02-13 ENCOUNTER — Ambulatory Visit (INDEPENDENT_AMBULATORY_CARE_PROVIDER_SITE_OTHER): Payer: Medicare Other | Admitting: Cardiology

## 2014-02-13 ENCOUNTER — Encounter: Payer: Self-pay | Admitting: Cardiology

## 2014-02-13 ENCOUNTER — Ambulatory Visit (INDEPENDENT_AMBULATORY_CARE_PROVIDER_SITE_OTHER): Payer: Medicare Other | Admitting: Pharmacist

## 2014-02-13 VITALS — BP 116/62 | HR 82 | Ht 67.0 in | Wt 154.0 lb

## 2014-02-13 DIAGNOSIS — G629 Polyneuropathy, unspecified: Secondary | ICD-10-CM

## 2014-02-13 DIAGNOSIS — Z7901 Long term (current) use of anticoagulants: Secondary | ICD-10-CM

## 2014-02-13 DIAGNOSIS — I4891 Unspecified atrial fibrillation: Secondary | ICD-10-CM | POA: Diagnosis not present

## 2014-02-13 DIAGNOSIS — Z5181 Encounter for therapeutic drug level monitoring: Secondary | ICD-10-CM

## 2014-02-13 DIAGNOSIS — G589 Mononeuropathy, unspecified: Secondary | ICD-10-CM

## 2014-02-13 DIAGNOSIS — I259 Chronic ischemic heart disease, unspecified: Secondary | ICD-10-CM

## 2014-02-13 DIAGNOSIS — R5381 Other malaise: Secondary | ICD-10-CM

## 2014-02-13 DIAGNOSIS — G609 Hereditary and idiopathic neuropathy, unspecified: Secondary | ICD-10-CM

## 2014-02-13 DIAGNOSIS — E039 Hypothyroidism, unspecified: Secondary | ICD-10-CM | POA: Diagnosis not present

## 2014-02-13 DIAGNOSIS — R5383 Other fatigue: Secondary | ICD-10-CM | POA: Diagnosis not present

## 2014-02-13 DIAGNOSIS — I480 Paroxysmal atrial fibrillation: Secondary | ICD-10-CM

## 2014-02-13 LAB — CBC WITH DIFFERENTIAL/PLATELET
BASOS ABS: 0 10*3/uL (ref 0.0–0.1)
Basophils Relative: 0.6 % (ref 0.0–3.0)
Eosinophils Absolute: 0.1 10*3/uL (ref 0.0–0.7)
Eosinophils Relative: 2 % (ref 0.0–5.0)
HCT: 38.7 % (ref 36.0–46.0)
Hemoglobin: 12.5 g/dL (ref 12.0–15.0)
LYMPHS ABS: 1.1 10*3/uL (ref 0.7–4.0)
Lymphocytes Relative: 15.4 % (ref 12.0–46.0)
MCHC: 32.4 g/dL (ref 30.0–36.0)
MCV: 95.6 fl (ref 78.0–100.0)
Monocytes Absolute: 0.8 10*3/uL (ref 0.1–1.0)
Monocytes Relative: 10.6 % (ref 3.0–12.0)
Neutro Abs: 5.1 10*3/uL (ref 1.4–7.7)
Neutrophils Relative %: 71.4 % (ref 43.0–77.0)
Platelets: 259 10*3/uL (ref 150.0–400.0)
RBC: 4.05 Mil/uL (ref 3.87–5.11)
RDW: 15.5 % — AB (ref 11.5–14.6)
WBC: 7.2 10*3/uL (ref 4.5–10.5)

## 2014-02-13 LAB — BASIC METABOLIC PANEL
BUN: 23 mg/dL (ref 6–23)
CO2: 28 mEq/L (ref 19–32)
Calcium: 9.4 mg/dL (ref 8.4–10.5)
Chloride: 104 mEq/L (ref 96–112)
Creatinine, Ser: 1 mg/dL (ref 0.4–1.2)
GFR: 59.74 mL/min — AB (ref >60.00)
GLUCOSE: 101 mg/dL — AB (ref 70–99)
Potassium: 3.7 mEq/L (ref 3.5–5.1)
Sodium: 141 mEq/L (ref 135–145)

## 2014-02-13 LAB — POCT INR: INR: 2.5

## 2014-02-13 LAB — VITAMIN B12: Vitamin B-12: 473 pg/mL (ref 211–911)

## 2014-02-13 MED ORDER — FEXOFENADINE HCL 60 MG PO TABS: 60.0000 mg | ORAL_TABLET | Freq: Two times a day (BID) | ORAL | Status: DC

## 2014-02-13 MED ORDER — NITROGLYCERIN 0.4 MG SL SUBL: 0.4000 mg | SUBLINGUAL_TABLET | SUBLINGUAL | Status: DC | PRN

## 2014-02-13 NOTE — Progress Notes (Signed)
Natasha Livingston Date of Birth:  1933/12/07 [address] [place], [place]  [address] [phone]         Fax   [phone]  History of Present Illness: This pleasant 80 year old woman is seen for a followup office visit. She has a history of paroxysmal atrial fibrillation. History of high blood pressure. She does have a history of atypical chest pain and had a Lexus scan Myoview stress test on 12/22/2012 which showed no evidence of ischemia and showed a small scar on the lateral wall. The study was not gated because of her atrial fibrillation. She has had remote CABG on 04/12/2007 by Dr. Darcey Nora. She has a past history of mitral regurgitation secondary to mitral valve prolapse. She also has a history of hypothyroidism and a history of essential hypertension.   Her last echocardiogram on 07/13/12 showed an ejection fraction of 40% and mild mitral regurgitation.  She previously had been on carvedilol but had inadequate rate control.  At her last visit we switched her to metoprolol and she has felt much better since then.    When last seen  on 08/18/13 the patient was unexpectedly found to be back in normal sinus rhythm.  However when she returned on 12/07/13 she was back in atrial fibrillation.  On 12/07/13 week tried increasing her metoprolol to 50 mg in the morning and 25 in the evening.  However she has not felt as well since we tried increasing the dose. The patient has peripheral neuropathy.  She had cut back on her evening dose of gabapentin and her symptoms have been more troublesome particularly at night.  She is also asking about the possibility of B12 shots. Current Outpatient Prescriptions  Medication Sig Dispense Refill  . ALPRAZolam (XANAX) 0.25 MG tablet TAKE 1/2 TABLET BY MOUTH 2 TIMES A DAY  30 tablet  3  . furosemide (LASIX) 40 MG tablet Take 1 tablet (40 mg total) by mouth daily.  30 tablet  5  . gabapentin (NEURONTIN) 800 MG tablet TAKE 1/2 TABLET BY MOUTH IN THE  MORNING AND 1 TABLET IN THE EVENING  135 tablet  1  . hydrocortisone (ANUSOL-HC) 2.5 % rectal cream Place 1 application rectally 2 (two) times daily as needed for hemorrhoids.      Marland Kitchen levothyroxine (SYNTHROID, LEVOTHROID) 25 MCG tablet Take 25 mcg by mouth daily before breakfast.      . lisinopril (PRINIVIL,ZESTRIL) 10 MG tablet Take 1 tablet (10 mg total) by mouth daily.  90 tablet  3  . metoprolol succinate (TOPROL-XL) 25 MG 24 hr tablet Take 25 mg by mouth 2 (two) times daily.      . Oxymetazoline HCl (AFRIN NASAL SPRAY NA) Place 2 sprays into left nostril daily as needed (nose bleed).      . traZODone (DESYREL) 50 MG tablet Take 25 mg by mouth at bedtime as needed for sleep.       Marland Kitchen warfarin (COUMADIN) 5 MG tablet Take 2.5-5 mg by mouth daily. Take 2.5mg  on Tuesday and Friday. All other days take 5mg       . fexofenadine (ALLEGRA) 60 MG tablet Take 1 tablet (60 mg total) by mouth 2 (two) times daily.  180 tablet  3  . KLOR-CON M20 20 MEQ tablet TAKE 1 TABLET BY MOUTH TWICE A DAY  60 tablet  3   No current facility-administered medications for this visit.    Allergies  Allergen Reactions  . Amiodarone Other (See Comments)  unknown  . Crestor [Rosuvastatin Calcium] Other (See Comments)    unknown  . Lipitor [Atorvastatin Calcium] Swelling    Patient Active Problem List   Diagnosis Date Noted  . Edema of foot 04/28/2011    Priority: High  . Malaise and fatigue 04/15/2011    Priority: High  . MVP (mitral valve prolapse)     Priority: High  . Atrial fibrillation 02/03/2011    Priority: High  . DOE (dyspnea on exertion)     Priority: Medium  . Encounter for therapeutic drug monitoring 12/07/2013  . Depression 12/07/2013  . Paroxysmal atrial fibrillation 08/18/2013  . Hypothyroid 04/20/2013  . Dermatitis 12/31/2012  . Benign hypertensive heart disease without heart failure 12/24/2011  . Encounter for long-term (current) use of anticoagulants 08/27/2011  . Chest discomfort   .  Dizziness   . Bruises easily   . Back pain   . Forgetfulness   . Atrial fibrillation   . Hypertension   . Hypothyroidism   . Aortic stenosis   . Hyperlipidemia   . Diverticulitis   . Interstitial nephritis   . Neuropathy   . Ischemic heart disease     History  Smoking status  . Never Smoker   Smokeless tobacco  . Not on file    History  Alcohol Use No    Family History  Problem Relation Age of Onset  . Cerebral aneurysm    . Cerebral aneurysm Father     Review of Systems: Constitutional: no fever chills diaphoresis or fatigue or change in weight.  Head and neck: no hearing loss, no epistaxis, no photophobia or visual disturbance. Respiratory: No cough, shortness of breath or wheezing. Cardiovascular: No chest pain peripheral edema, palpitations. Gastrointestinal: No abdominal distention, no abdominal pain, no change in bowel habits hematochezia or melena. Genitourinary: No dysuria, no frequency, no urgency, no nocturia. Musculoskeletal:No arthralgias, no back pain, no gait disturbance or myalgias. Neurological: No dizziness, no headaches, no numbness, no seizures, no syncope, no weakness, no tremors. Hematologic: No lymphadenopathy, no easy bruising. Psychiatric: No confusion, no hallucinations, no sleep disturbance.    Physical Exam: Filed Vitals:   02/13/14 0938  BP: 116/62  Pulse: 82   the general appearance reveals a pleasant elderly woman in no distress.The head and neck exam reveals pupils equal and reactive.  Extraocular movements are full.  There is no scleral icterus.  The mouth and pharynx are normal.  The neck is supple.  The carotids reveal no bruits.  The jugular venous pressure is normal.  The  thyroid is not enlarged.  There is no lymphadenopathy.  The chest is clear to percussion and auscultation.  There are no rales or rhonchi.  Expansion of the chest is symmetrical.  The precordium is quiet.  The pulse is irregularly irregular  The first heart  sound is normal.  The second heart sound is physiologically split.  There is no  gallop rub or click.  There is a soft systolic murmur. There is no abnormal lift or heave.  The abdomen is soft and nontender.  The bowel sounds are normal.  The liver and spleen are not enlarged.  There are no abdominal masses.  There are no abdominal bruits.  Extremities reveal good pedal pulses.  There is no phlebitis or edema.  There is no cyanosis or clubbing.  Strength is normal and symmetrical in all extremities.  There is no lateralizing weakness.  There are no sensory deficits.  The skin is warm and dry.  There is no rash.  EKG shows atrial fibrillation with controlled ventricular response.  No significant change since last tracing.   Assessment / Plan: Reduce Toprol to just 25 mg twice a day.  Check lab work today including a B12 level at her request. Trial of sublingual nitroglycerin and 12 over the counter Mylanta and Pepcid AC for her epigastric and low chest discomfort. Recheck in 4 months for followup office visit and lab work

## 2014-02-13 NOTE — Assessment & Plan Note (Signed)
Her friends have been telling her that she should be on B12 shots for her neuropathy.  I explained that we would not be able to give her B12 shot unless she actually has B12 deficiency.  We will check a B12 level today as well as a CBC and electrolytes.  She may wish to discuss this with her PCP.  She is in the process of obtaining a PCP at [facility]

## 2014-02-13 NOTE — Assessment & Plan Note (Signed)
She has been having occasional substernal discomfort not related to exertion.  It is difficult to know whether this is related to her heart or possibly GERD.  She has not tried any nitroglycerin.  We will give her a fresh bottle of nitroglycerin prescription.  She will also try Mylanta or Maalox or Pepcid AC and see if any of these remedies to help.  She had a nonischemic Myoview one year ago

## 2014-02-13 NOTE — Patient Instructions (Signed)
Will obtain labs today and call you with the results (b12/cbc/bmet)  DECREASE YOUR TOPROL (METOPROLOL) TO 25 MG TWICE A DAY  Your physician wants you to follow-up in: 4 months with fasting labs (lp/bmet/hfp)  You will receive a reminder letter in the mail two months in advance. If you don't receive a letter, please call our office to schedule the follow-up appointment.

## 2014-02-13 NOTE — Assessment & Plan Note (Signed)
She remains in atrial fibrillation with controlled ventricular response.  She has not felt as well since we increased her Toprol at her last visit.  We will go back to the previous dose of 25 mg twice a day for rate control.

## 2014-02-13 NOTE — Progress Notes (Signed)
Quick Note:  Please report to patient. The recent labs are stable. Continue same medication and careful diet. The vitamin B12 level is normal so she would not qualify for B12 shots from Medicare's standpoint. Hemoglobin is 12.5 so she is not anemic. ______

## 2014-02-14 ENCOUNTER — Other Ambulatory Visit: Payer: Self-pay | Admitting: Cardiology

## 2014-02-14 ENCOUNTER — Telehealth: Payer: Self-pay | Admitting: *Deleted

## 2014-02-14 NOTE — Telephone Encounter (Signed)
Message copied by Earvin Hansen on Tue Feb 14, 2014  4:23 PM ------      Message from: Darlin Coco      Created: Mon Feb 13, 2014  3:09 PM       Please report to patient.  The recent labs are stable. Continue same medication and careful diet.  The vitamin B12 level is normal so she would not qualify for B12 shots from Medicare's standpoint.  Hemoglobin is 12.5 so she is not anemic. ------

## 2014-02-14 NOTE — Telephone Encounter (Signed)
Advised patient of lab results  

## 2014-03-08 ENCOUNTER — Ambulatory Visit: Payer: Medicare Other | Admitting: Cardiology

## 2014-03-19 ENCOUNTER — Other Ambulatory Visit: Payer: Self-pay | Admitting: Cardiology

## 2014-03-22 ENCOUNTER — Ambulatory Visit (INDEPENDENT_AMBULATORY_CARE_PROVIDER_SITE_OTHER): Payer: Medicare Other | Admitting: Nurse Practitioner

## 2014-03-22 ENCOUNTER — Encounter: Payer: Self-pay | Admitting: Nurse Practitioner

## 2014-03-22 ENCOUNTER — Ambulatory Visit (INDEPENDENT_AMBULATORY_CARE_PROVIDER_SITE_OTHER): Payer: Medicare Other | Admitting: Pharmacist

## 2014-03-22 VITALS — BP 150/90 | HR 88 | Ht 67.0 in | Wt 155.1 lb

## 2014-03-22 DIAGNOSIS — R0600 Dyspnea, unspecified: Secondary | ICD-10-CM

## 2014-03-22 DIAGNOSIS — R0989 Other specified symptoms and signs involving the circulatory and respiratory systems: Secondary | ICD-10-CM

## 2014-03-22 DIAGNOSIS — R0609 Other forms of dyspnea: Secondary | ICD-10-CM | POA: Diagnosis not present

## 2014-03-22 DIAGNOSIS — I4891 Unspecified atrial fibrillation: Secondary | ICD-10-CM

## 2014-03-22 DIAGNOSIS — Z5181 Encounter for therapeutic drug level monitoring: Secondary | ICD-10-CM

## 2014-03-22 DIAGNOSIS — I259 Chronic ischemic heart disease, unspecified: Secondary | ICD-10-CM

## 2014-03-22 DIAGNOSIS — Z7901 Long term (current) use of anticoagulants: Secondary | ICD-10-CM | POA: Diagnosis not present

## 2014-03-22 DIAGNOSIS — H04129 Dry eye syndrome of unspecified lacrimal gland: Secondary | ICD-10-CM | POA: Diagnosis not present

## 2014-03-22 DIAGNOSIS — H259 Unspecified age-related cataract: Secondary | ICD-10-CM | POA: Diagnosis not present

## 2014-03-22 DIAGNOSIS — I480 Paroxysmal atrial fibrillation: Secondary | ICD-10-CM

## 2014-03-22 DIAGNOSIS — R079 Chest pain, unspecified: Secondary | ICD-10-CM

## 2014-03-22 LAB — BASIC METABOLIC PANEL
BUN: 18 mg/dL (ref 6–23)
CO2: 28 mEq/L (ref 19–32)
Calcium: 9.5 mg/dL (ref 8.4–10.5)
Chloride: 102 mEq/L (ref 96–112)
Creatinine, Ser: 1.1 mg/dL (ref 0.4–1.2)
GFR: 49.99 mL/min — ABNORMAL LOW (ref >60.00)
Glucose, Bld: 95 mg/dL (ref 70–99)
Potassium: 4.3 mEq/L (ref 3.5–5.1)
Sodium: 137 mEq/L (ref 135–145)

## 2014-03-22 LAB — POCT INR: INR: 2.9

## 2014-03-22 LAB — BRAIN NATRIURETIC PEPTIDE: Pro B Natriuretic peptide (BNP): 396 pg/mL — ABNORMAL HIGH (ref 0.0–100.0)

## 2014-03-22 NOTE — Patient Instructions (Signed)
Stay on your current medicines for now  I am rechecking your fluid level test today  We will arrange for an echocardiogram  Dr. Mare Ferrari and I will then discuss what we need to do next   Call the [place] office at [phone] if you have any questions, problems or concerns.

## 2014-03-22 NOTE — Progress Notes (Signed)
Natasha Livingston Date of Birth: February 11, 1934 Medical Record #[id number]  History of Present Illness: Natasha Livingston is seen back today for a one month check. Seen for Dr. Mare Ferrari. She has PAF, HTN, CAD with remote CABG per [facility] in 2008, atypical chest pain with negative Myoview from 2014, MVP and MR, hypothyroidism, neuropathy, and HLD.   Most recently having more issues with PAF - has had toprol increased but did not tolerate. She was otherwise doing ok at her visit last month - was to come back in 4 months.   Comes in today. Here with her son. Not really clear as to why she is here - but probably from an old recall letter that was actually received. Notes that since her Toprol was cut back - she has been more short of breath and having leg fatigue. She just pants at times. Some cough - productive in the am's. She is short of breath with minimal activity and at rest. Gives out easily. No chest pain. Salt use is pretty excessive.   Current Outpatient Prescriptions  Medication Sig Dispense Refill  . ALPRAZolam (XANAX) 0.25 MG tablet TAKE 1/2 TABLET BY MOUTH 2 TIMES A DAY  30 tablet  3  . fexofenadine (ALLEGRA) 60 MG tablet Take 1 tablet (60 mg total) by mouth 2 (two) times daily.  180 tablet  3  . furosemide (LASIX) 40 MG tablet Take 1 tablet (40 mg total) by mouth daily.  30 tablet  5  . gabapentin (NEURONTIN) 800 MG tablet TAKE 1/2 TABLET BY MOUTH IN THE MORNING AND 1 TABLET IN THE EVENING  135 tablet  1  . hydrocortisone (ANUSOL-HC) 2.5 % rectal cream Place 1 application rectally 2 (two) times daily as needed for hemorrhoids.      Marland Kitchen KLOR-CON M20 20 MEQ tablet TAKE 1 TABLET BY MOUTH TWICE A DAY  60 tablet  3  . levothyroxine (SYNTHROID, LEVOTHROID) 25 MCG tablet Take 25 mcg by mouth daily before breakfast.      . lisinopril (PRINIVIL,ZESTRIL) 10 MG tablet Take 1 tablet (10 mg total) by mouth daily.  90 tablet  3  . metoprolol succinate (TOPROL-XL) 25 MG 24 hr tablet Take 25 mg by mouth 2 (two)  times daily.      . nitroGLYCERIN (NITROSTAT) 0.4 MG SL tablet Place 1 tablet (0.4 mg total) under the tongue every 5 (five) minutes as needed for chest pain.  25 tablet  PRN  . Oxymetazoline HCl (AFRIN NASAL SPRAY NA) Place 2 sprays into left nostril daily as needed (nose bleed).      . traZODone (DESYREL) 50 MG tablet Take 25 mg by mouth at bedtime as needed for sleep.       Marland Kitchen warfarin (COUMADIN) 5 MG tablet Take 2.5-5 mg by mouth daily. Take 2.5mg  on Tuesday and Friday. All other days take 5mg        No current facility-administered medications for this visit.    Allergies  Allergen Reactions  . Amiodarone Other (See Comments)    unknown  . Crestor [Rosuvastatin Calcium] Other (See Comments)    unknown  . Lipitor [Atorvastatin Calcium] Swelling    Past Medical History  Diagnosis Date  . Chest discomfort   . Dizziness   . MVP (mitral valve prolapse)   . DOE (dyspnea on exertion)   . Bruises easily   . Back pain   . Forgetfulness   . Atrial fibrillation   . Hypertension   . Hypothyroidism   .  Aortic stenosis   . Hyperlipidemia   . Diverticulitis   . Interstitial nephritis   . Neuropathy   . Ischemic heart disease     Past Surgical History  Procedure Laterality Date  . Coronary artery bypass graft  04/2007  . Cardioversion  08/2008  . Ovarian cyst removal      History  Smoking status  . Never Smoker   Smokeless tobacco  . Not on file    History  Alcohol Use No    Family History  Problem Relation Age of Onset  . Cerebral aneurysm    . Cerebral aneurysm Father     Review of Systems: The review of systems is per the HPI.  All other systems were reviewed and are negative.  Physical Exam: BP 150/90  Pulse 88  Ht 5\' 7"  (1.702 m)  Wt 155 lb 1.9 oz (70.362 kg)  BMI 24.29 kg/m2  SpO2 97% Patient is very pleasant and in no acute distress but she does have spells of where she just pants. Skin is warm and dry. Color is normal.  HEENT is unremarkable.  Normocephalic/atraumatic. PERRL. Sclera are nonicteric. Neck is supple. No masses. No JVD. Lungs are clear. Cardiac exam shows an irregular rhythm. Rate is 100 by my count. Abdomen is soft. Extremities are without edema. Gait and ROM are intact. No gross neurologic deficits noted.  Wt Readings from Last 3 Encounters:  03/22/14 155 lb 1.9 oz (70.362 kg)  02/13/14 154 lb (69.854 kg)  12/29/13 150 lb (68.04 kg)    LABORATORY DATA: PENDING  Lab Results  Component Value Date   WBC 7.2 02/13/2014   HGB 12.5 02/13/2014   HCT 38.7 02/13/2014   PLT 259.0 02/13/2014   GLUCOSE 101* 02/13/2014   CHOL 184 10/19/2012   TRIG 113.0 10/19/2012   HDL 40.10 10/19/2012   LDLCALC 121* 10/19/2012   ALT 28 04/20/2013   AST 34 04/20/2013   NA 141 02/13/2014   K 3.7 02/13/2014   CL 104 02/13/2014   CREATININE 1.0 02/13/2014   BUN 23 02/13/2014   CO2 28 02/13/2014   TSH 1.18 04/20/2013   INR 2.9 03/22/2014   HGBA1C  Value: 6.9 (NOTE)   The ADA recommends the following therapeutic goals for glycemic   control related to Hgb A1C measurement:   Goal of Therapy:   < 7.0% Hgb A1C   Action Suggested:  > 8.0% Hgb A1C   Ref:  Diabetes Care, 22, Suppl. 1, 1999* 04/08/2007   Echo Study Conclusions from 2013  - Left ventricle: The cavity size was mildly dilated. Wall thickness was increased in a pattern of mild LVH. The estimated ejection fraction was 40%. Diffuse hypokinesis. - Mitral valve: Mild regurgitation. - Left atrium: The atrium was moderately to severely dilated. - Right atrium: The atrium was severely dilated. - Atrial septum: No defect or patent foramen ovale was identified. - Tricuspid valve: Severe regurgitation. - Pulmonary arteries: PA peak pressure: 56mm Hg (S).    Assessment / Plan:  1. PAF - clearly in AF today - not able to tolerate further increase in her Toprol. She has bradycardia when in sinus. I have left her on her current regimen for now.   2. CAD with remote CABG - negative Myoview in 2014  - no chest pain reported.   3. Dyspnea - not sure what the etiology is - may be anxiety driven but has known LV systolic dysfunction and valvular heart disease - will update the echo. Further  disposition to follow. Will check BNP today as well with BMET. May need to consider PFTs.   Patient is agreeable to this plan and will call if any problems develop in the interim.   Burtis Junes, RN, [facility] [address] [place], [place]  [address] [phone]

## 2014-03-24 ENCOUNTER — Ambulatory Visit (HOSPITAL_COMMUNITY): Payer: Medicare Other | Attending: Cardiology | Admitting: Radiology

## 2014-03-24 DIAGNOSIS — I5020 Unspecified systolic (congestive) heart failure: Secondary | ICD-10-CM | POA: Insufficient documentation

## 2014-03-24 DIAGNOSIS — R0989 Other specified symptoms and signs involving the circulatory and respiratory systems: Secondary | ICD-10-CM | POA: Insufficient documentation

## 2014-03-24 DIAGNOSIS — I4891 Unspecified atrial fibrillation: Secondary | ICD-10-CM

## 2014-03-24 DIAGNOSIS — I480 Paroxysmal atrial fibrillation: Secondary | ICD-10-CM

## 2014-03-24 DIAGNOSIS — I38 Endocarditis, valve unspecified: Secondary | ICD-10-CM | POA: Diagnosis not present

## 2014-03-24 DIAGNOSIS — R0600 Dyspnea, unspecified: Secondary | ICD-10-CM

## 2014-03-24 DIAGNOSIS — R0609 Other forms of dyspnea: Secondary | ICD-10-CM | POA: Diagnosis not present

## 2014-03-24 DIAGNOSIS — Z09 Encounter for follow-up examination after completed treatment for conditions other than malignant neoplasm: Secondary | ICD-10-CM | POA: Insufficient documentation

## 2014-03-24 DIAGNOSIS — R079 Chest pain, unspecified: Secondary | ICD-10-CM

## 2014-03-24 DIAGNOSIS — I509 Heart failure, unspecified: Secondary | ICD-10-CM | POA: Diagnosis not present

## 2014-03-24 NOTE — Progress Notes (Addendum)
Echocardiogram performed. Please call results to Jarely Juncaj (son) at [phone].

## 2014-03-29 ENCOUNTER — Telehealth: Payer: Self-pay | Admitting: Cardiology

## 2014-03-29 MED ORDER — POTASSIUM CHLORIDE CRYS ER 20 MEQ PO TBCR: 20.0000 meq | EXTENDED_RELEASE_TABLET | Freq: Three times a day (TID) | ORAL | Status: DC

## 2014-03-29 MED ORDER — FUROSEMIDE 40 MG PO TABS: 40.0000 mg | ORAL_TABLET | Freq: Two times a day (BID) | ORAL | Status: DC

## 2014-03-29 NOTE — Telephone Encounter (Signed)
New message ° ° ° ° °Returning Natasha Livingston's call  ° °

## 2014-03-29 NOTE — Telephone Encounter (Signed)
Advised son

## 2014-03-29 NOTE — Telephone Encounter (Signed)
Message copied by Earvin Hansen on Wed Mar 29, 2014  6:49 PM ------      Message from: Darlin Coco      Created: Mon Mar 27, 2014  9:01 AM       Her echo shows improvement in EF from 40% now up to 50%. I think diastolic dysfunction is playing a role in her dyspnea. I want her to increase her lasix 40 mg to BID.  Increase Klorcon 20 to TID. Consider pulmonary referral if no improvement. ------

## 2014-04-13 ENCOUNTER — Telehealth: Payer: Self-pay | Admitting: Cardiology

## 2014-04-13 NOTE — Telephone Encounter (Signed)
New problem   Pt need clearance to have eye surgery. Please advise

## 2014-04-13 NOTE — Telephone Encounter (Signed)
Patient having cataract surgery. Will forward to  Dr. Mare Ferrari for review

## 2014-04-13 NOTE — Telephone Encounter (Signed)
Discussed with  Dr. Mare Ferrari and ok to have surgery, left message per sons request

## 2014-04-15 ENCOUNTER — Other Ambulatory Visit: Payer: Self-pay | Admitting: Cardiology

## 2014-04-26 ENCOUNTER — Other Ambulatory Visit: Payer: Self-pay | Admitting: Cardiology

## 2014-05-03 ENCOUNTER — Ambulatory Visit (INDEPENDENT_AMBULATORY_CARE_PROVIDER_SITE_OTHER): Payer: Medicare Other | Admitting: *Deleted

## 2014-05-03 DIAGNOSIS — Z5181 Encounter for therapeutic drug level monitoring: Secondary | ICD-10-CM

## 2014-05-03 DIAGNOSIS — Z7901 Long term (current) use of anticoagulants: Secondary | ICD-10-CM | POA: Diagnosis not present

## 2014-05-03 DIAGNOSIS — I4891 Unspecified atrial fibrillation: Secondary | ICD-10-CM | POA: Diagnosis not present

## 2014-05-03 LAB — POCT INR: INR: 2.5

## 2014-05-22 ENCOUNTER — Other Ambulatory Visit: Payer: Self-pay

## 2014-05-22 ENCOUNTER — Other Ambulatory Visit: Payer: Self-pay | Admitting: Cardiology

## 2014-05-22 DIAGNOSIS — F419 Anxiety disorder, unspecified: Secondary | ICD-10-CM

## 2014-05-22 MED ORDER — ALPRAZOLAM 0.25 MG PO TABS: ORAL_TABLET | ORAL | Status: DC

## 2014-05-22 NOTE — Telephone Encounter (Signed)
Ok to send 90 day per  Dr. Mare Ferrari, advised daughter

## 2014-05-30 DIAGNOSIS — H25019 Cortical age-related cataract, unspecified eye: Secondary | ICD-10-CM | POA: Diagnosis not present

## 2014-05-30 DIAGNOSIS — H25049 Posterior subcapsular polar age-related cataract, unspecified eye: Secondary | ICD-10-CM | POA: Diagnosis not present

## 2014-05-30 DIAGNOSIS — H2510 Age-related nuclear cataract, unspecified eye: Secondary | ICD-10-CM | POA: Diagnosis not present

## 2014-06-06 DIAGNOSIS — L259 Unspecified contact dermatitis, unspecified cause: Secondary | ICD-10-CM | POA: Diagnosis not present

## 2014-06-14 ENCOUNTER — Other Ambulatory Visit: Payer: Self-pay | Admitting: Cardiology

## 2014-06-16 ENCOUNTER — Ambulatory Visit (INDEPENDENT_AMBULATORY_CARE_PROVIDER_SITE_OTHER): Payer: Medicare Other | Admitting: Pharmacist

## 2014-06-16 ENCOUNTER — Ambulatory Visit: Payer: Medicare Other | Admitting: Cardiology

## 2014-06-16 DIAGNOSIS — Z7901 Long term (current) use of anticoagulants: Secondary | ICD-10-CM

## 2014-06-16 DIAGNOSIS — Z5181 Encounter for therapeutic drug level monitoring: Secondary | ICD-10-CM

## 2014-06-16 DIAGNOSIS — I4891 Unspecified atrial fibrillation: Secondary | ICD-10-CM

## 2014-06-16 LAB — POCT INR: INR: 3.3

## 2014-06-20 DIAGNOSIS — H2510 Age-related nuclear cataract, unspecified eye: Secondary | ICD-10-CM | POA: Diagnosis not present

## 2014-06-20 DIAGNOSIS — H25019 Cortical age-related cataract, unspecified eye: Secondary | ICD-10-CM | POA: Diagnosis not present

## 2014-07-12 ENCOUNTER — Other Ambulatory Visit: Payer: Self-pay | Admitting: Cardiology

## 2014-07-17 ENCOUNTER — Other Ambulatory Visit: Payer: Self-pay | Admitting: Cardiology

## 2014-07-20 DIAGNOSIS — Z23 Encounter for immunization: Secondary | ICD-10-CM | POA: Diagnosis not present

## 2014-07-25 ENCOUNTER — Ambulatory Visit (INDEPENDENT_AMBULATORY_CARE_PROVIDER_SITE_OTHER): Payer: Medicare Other | Admitting: Pharmacist

## 2014-07-25 ENCOUNTER — Encounter: Payer: Self-pay | Admitting: Cardiology

## 2014-07-25 ENCOUNTER — Ambulatory Visit (INDEPENDENT_AMBULATORY_CARE_PROVIDER_SITE_OTHER): Payer: Medicare Other | Admitting: Cardiology

## 2014-07-25 VITALS — BP 120/82 | HR 82 | Ht 67.0 in | Wt 157.0 lb

## 2014-07-25 DIAGNOSIS — R0600 Dyspnea, unspecified: Secondary | ICD-10-CM

## 2014-07-25 DIAGNOSIS — I119 Hypertensive heart disease without heart failure: Secondary | ICD-10-CM | POA: Diagnosis not present

## 2014-07-25 DIAGNOSIS — Z7901 Long term (current) use of anticoagulants: Secondary | ICD-10-CM

## 2014-07-25 DIAGNOSIS — R0989 Other specified symptoms and signs involving the circulatory and respiratory systems: Secondary | ICD-10-CM | POA: Diagnosis not present

## 2014-07-25 DIAGNOSIS — I4891 Unspecified atrial fibrillation: Secondary | ICD-10-CM | POA: Diagnosis not present

## 2014-07-25 DIAGNOSIS — I259 Chronic ischemic heart disease, unspecified: Secondary | ICD-10-CM | POA: Diagnosis not present

## 2014-07-25 DIAGNOSIS — R0609 Other forms of dyspnea: Secondary | ICD-10-CM

## 2014-07-25 DIAGNOSIS — Z5181 Encounter for therapeutic drug level monitoring: Secondary | ICD-10-CM

## 2014-07-25 LAB — POCT INR: INR: 5

## 2014-07-25 NOTE — Assessment & Plan Note (Signed)
Her INR today is elevated at 5.0 .  The only new change in her regimen was some eyedrops following recent cataract surgery 3 weeks ago. She has not had any TIA symptoms.  She has not had any clinical bleeding from her Coumadin.

## 2014-07-25 NOTE — Patient Instructions (Signed)
Your physician recommends that you continue on your current medications as directed. Please refer to the Current Medication list given to you today.  Your physician wants you to follow-up in: 4 months with fasting labs (lp/bmet/hfp)  You will receive a reminder letter in the mail two months in advance. If you don't receive a letter, please call our office to schedule the follow-up appointment.   Work on trying to lose weight

## 2014-07-25 NOTE — Progress Notes (Signed)
Natasha Livingston Date of Birth:  November 10, 1933 [place] [address] [place], [place]  [address] [phone]        Fax   [phone]   History of Present Illness: This pleasant 80 year old woman is seen for a followup office visit. She has a history of chronic atrial fibrillation. History of high blood pressure. She does have a history of atypical chest pain and had a Lexus scan Myoview stress test on 12/22/2012 which showed no evidence of ischemia and showed a small scar on the lateral wall. The study was not gated because of her atrial fibrillation. She has had remote CABG on 04/12/2007 by Dr. Darcey Nora. She has a past history of mitral regurgitation secondary to mitral valve prolapse. She also has a history of hypothyroidism and a history of essential hypertension.  Her last echocardiogram on 07/13/12 showed an ejection fraction of 40% and mild mitral regurgitation. She previously had been on carvedilol but had inadequate rate control. At her last visit we switched her to metoprolol and she has felt much better since then. When last seen on 08/18/13 the patient was unexpectedly found to be back in normal sinus rhythm. However when she returned on 12/07/13 she was back in atrial fibrillation.  She remains in atrial fibrillation.  She is on Coumadin.  Current Outpatient Prescriptions  Medication Sig Dispense Refill  . ALPRAZolam (XANAX) 0.25 MG tablet TAKE 1/2 TABLET BY MOUTH 2 TIMES A DAY  90 tablet  5  . fexofenadine (ALLEGRA) 60 MG tablet Take 60 mg by mouth 2 (two) times daily as needed.      . furosemide (LASIX) 40 MG tablet Take 1 tablet (40 mg total) by mouth 2 (two) times daily.  60 tablet  5  . gabapentin (NEURONTIN) 800 MG tablet TAKE 1/2 TABLET BY MOUTH IN THE MORNING AND 1 TABLET IN THE EVENING  135 tablet  1  . hydrocortisone (ANUSOL-HC) 2.5 % rectal cream Place 1 application rectally 2 (two) times daily as needed for hemorrhoids.      Marland Kitchen levothyroxine  (SYNTHROID, LEVOTHROID) 25 MCG tablet TAKE 1 TABLET BY MOUTH EVERY DAY  30 tablet  0  . lisinopril (PRINIVIL,ZESTRIL) 10 MG tablet Take 1 tablet (10 mg total) by mouth daily.  90 tablet  3  . lisinopril (PRINIVIL,ZESTRIL) 10 MG tablet TAKE 1 TABLET BY MOUTH EVERY DAY  30 tablet  0  . metoprolol succinate (TOPROL-XL) 25 MG 24 hr tablet Take 25 mg by mouth 2 (two) times daily.      . nitroGLYCERIN (NITROSTAT) 0.4 MG SL tablet Place 1 tablet (0.4 mg total) under the tongue every 5 (five) minutes as needed for chest pain.  25 tablet  PRN  . Oxymetazoline HCl (AFRIN NASAL SPRAY NA) Place 2 sprays into left nostril daily as needed (nose bleed).      . potassium chloride SA (KLOR-CON M20) 20 MEQ tablet Take 1 tablet (20 mEq total) by mouth 3 (three) times daily.  90 tablet  5  . traZODone (DESYREL) 50 MG tablet Take 25 mg by mouth at bedtime as needed for sleep.       Marland Kitchen warfarin (COUMADIN) 5 MG tablet TAKE 1 TABLET BY MOUTH EVERY DAY OR AS DIRECTED  90 tablet  1   No current facility-administered medications for this visit.    Allergies  Allergen Reactions  . Amiodarone Other (See Comments)    unknown  . Crestor [Rosuvastatin Calcium] Other (See Comments)  unknown  . Lipitor [Atorvastatin Calcium] Swelling    Patient Active Problem List   Diagnosis Date Noted  . Edema of foot 04/28/2011    Priority: High  . Malaise and fatigue 04/15/2011    Priority: High  . MVP (mitral valve prolapse)     Priority: High  . Atrial fibrillation 02/03/2011    Priority: High  . DOE (dyspnea on exertion)     Priority: Medium  . Encounter for therapeutic drug monitoring 12/07/2013  . Depression 12/07/2013  . Paroxysmal atrial fibrillation 08/18/2013  . Hypothyroid 04/20/2013  . Dermatitis 12/31/2012  . Benign hypertensive heart disease without heart failure 12/24/2011  . Encounter for long-term (current) use of anticoagulants 08/27/2011  . Chest discomfort   . Dizziness   . Bruises easily   . Back  pain   . Forgetfulness   . Atrial fibrillation   . Hypertension   . Hypothyroidism   . Aortic stenosis   . Hyperlipidemia   . Diverticulitis   . Interstitial nephritis   . Neuropathy   . Ischemic heart disease     History  Smoking status  . Never Smoker   Smokeless tobacco  . Not on file    History  Alcohol Use No    Family History  Problem Relation Age of Onset  . Cerebral aneurysm    . Cerebral aneurysm Father     Review of Systems: Constitutional: no fever chills diaphoresis or fatigue or change in weight.  Head and neck: no hearing loss, no epistaxis, no photophobia or visual disturbance. Respiratory: No cough, shortness of breath or wheezing. Cardiovascular: No chest pain peripheral edema, palpitations. Gastrointestinal: No abdominal distention, no abdominal pain, no change in bowel habits hematochezia or melena. Genitourinary: No dysuria, no frequency, no urgency, no nocturia. Musculoskeletal:No arthralgias, no back pain, no gait disturbance or myalgias. Neurological: No dizziness, no headaches, no numbness, no seizures, no syncope, no weakness, no tremors. Hematologic: No lymphadenopathy, no easy bruising. Psychiatric: No confusion, no hallucinations, no sleep disturbance.    Physical Exam: Filed Vitals:   07/25/14 1134  BP: 120/82  Pulse: 82  The patient appears to be in no distress.  Head and neck exam reveals that the pupils are equal and reactive.  The extraocular movements are full.  There is no scleral icterus.  Mouth and pharynx are benign.  No lymphadenopathy.  No carotid bruits.  The jugular venous pressure is normal.  Thyroid is not enlarged or tender.  Chest is clear to percussion and auscultation.  No rales or rhonchi.  Expansion of the chest is symmetrical.  Heart reveals no abnormal lift or heave.  First and second heart sounds are normal.  The pulse is irregularly irregular  There is a soft apical systolic murmur.  The abdomen is soft and  nontender.  Bowel sounds are normoactive.  There is no hepatosplenomegaly or mass.  There are no abdominal bruits.  Extremities reveal no phlebitis or edema.  Pedal pulses are good.  There is no cyanosis or clubbing.  Neurologic exam is normal strength and no lateralizing weakness.  No sensory deficits.  Integument reveals no rash    Assessment / Plan: 1.  Persistent atrial fibrillation on long-term Coumadin. 2. ischemic heart disease status post CABG in 2008 3. essential hypertension without heart failure 4. mitral valve prolapse with mild mitral regurgitation. 5. peripheral neuropathy. 6. Hypothyroidism 7. Hypercholesterolemia.  Plan: Continue same medication.  Try to lose weight.  Recheck in 4 months for followup office  visit lipid panel hepatic function panel and basal metabolic panel

## 2014-07-25 NOTE — Assessment & Plan Note (Signed)
The patient has had no recurrent chest pain or angina pectoris.

## 2014-07-25 NOTE — Assessment & Plan Note (Signed)
Her blood pressure has been remaining stable on current therapy.  She is having some exertional dyspnea particularly if she tries to bend over.  Some of this is related to her abdominal obesity.  Her weight is up 2 pounds since last visit.

## 2014-08-01 ENCOUNTER — Ambulatory Visit (INDEPENDENT_AMBULATORY_CARE_PROVIDER_SITE_OTHER): Payer: Medicare Other | Admitting: Pharmacist

## 2014-08-01 DIAGNOSIS — Z5181 Encounter for therapeutic drug level monitoring: Secondary | ICD-10-CM

## 2014-08-01 DIAGNOSIS — I4891 Unspecified atrial fibrillation: Secondary | ICD-10-CM

## 2014-08-01 DIAGNOSIS — Z7901 Long term (current) use of anticoagulants: Secondary | ICD-10-CM

## 2014-08-01 LAB — POCT INR: INR: 1.6

## 2014-08-15 ENCOUNTER — Ambulatory Visit (INDEPENDENT_AMBULATORY_CARE_PROVIDER_SITE_OTHER): Payer: Medicare Other | Admitting: Pharmacist

## 2014-08-15 ENCOUNTER — Other Ambulatory Visit: Payer: Self-pay | Admitting: Cardiology

## 2014-08-15 DIAGNOSIS — Z7901 Long term (current) use of anticoagulants: Secondary | ICD-10-CM | POA: Diagnosis not present

## 2014-08-15 DIAGNOSIS — I4891 Unspecified atrial fibrillation: Secondary | ICD-10-CM | POA: Diagnosis not present

## 2014-08-15 DIAGNOSIS — Z5181 Encounter for therapeutic drug level monitoring: Secondary | ICD-10-CM | POA: Diagnosis not present

## 2014-08-15 LAB — POCT INR: INR: 2.8

## 2014-08-17 ENCOUNTER — Other Ambulatory Visit: Payer: Self-pay | Admitting: Cardiology

## 2014-08-17 ENCOUNTER — Other Ambulatory Visit: Payer: Self-pay

## 2014-08-17 MED ORDER — LEVOTHYROXINE SODIUM 25 MCG PO TABS: ORAL_TABLET | ORAL | Status: DC

## 2014-08-29 ENCOUNTER — Telehealth: Payer: Self-pay | Admitting: Cardiology

## 2014-08-29 MED ORDER — METOPROLOL SUCCINATE ER 25 MG PO TB24: 25.0000 mg | ORAL_TABLET | Freq: Two times a day (BID) | ORAL | Status: DC

## 2014-08-29 NOTE — Telephone Encounter (Signed)
°  Patient's son has questions regarding directions on medication, please call and advise.

## 2014-08-29 NOTE — Telephone Encounter (Signed)
Reviewed Metoprolol dose with son and he is correct with her dose of Metoprolol 25 mg twice a day

## 2014-09-05 ENCOUNTER — Ambulatory Visit (INDEPENDENT_AMBULATORY_CARE_PROVIDER_SITE_OTHER): Payer: Medicare Other

## 2014-09-05 DIAGNOSIS — Z5181 Encounter for therapeutic drug level monitoring: Secondary | ICD-10-CM | POA: Diagnosis not present

## 2014-09-05 DIAGNOSIS — Z7901 Long term (current) use of anticoagulants: Secondary | ICD-10-CM | POA: Diagnosis not present

## 2014-09-05 DIAGNOSIS — I4891 Unspecified atrial fibrillation: Secondary | ICD-10-CM

## 2014-09-05 LAB — POCT INR: INR: 3.7

## 2014-09-08 DIAGNOSIS — Z1231 Encounter for screening mammogram for malignant neoplasm of breast: Secondary | ICD-10-CM | POA: Diagnosis not present

## 2014-09-18 ENCOUNTER — Other Ambulatory Visit: Payer: Self-pay | Admitting: Cardiology

## 2014-09-19 ENCOUNTER — Ambulatory Visit (INDEPENDENT_AMBULATORY_CARE_PROVIDER_SITE_OTHER): Payer: Medicare Other

## 2014-09-19 DIAGNOSIS — Z5181 Encounter for therapeutic drug level monitoring: Secondary | ICD-10-CM | POA: Diagnosis not present

## 2014-09-19 DIAGNOSIS — Z7901 Long term (current) use of anticoagulants: Secondary | ICD-10-CM

## 2014-09-19 DIAGNOSIS — I4891 Unspecified atrial fibrillation: Secondary | ICD-10-CM

## 2014-09-19 LAB — POCT INR: INR: 2.6

## 2014-09-21 ENCOUNTER — Other Ambulatory Visit: Payer: Self-pay | Admitting: Cardiology

## 2014-09-21 DIAGNOSIS — G629 Polyneuropathy, unspecified: Secondary | ICD-10-CM

## 2014-09-26 ENCOUNTER — Encounter: Payer: Self-pay | Admitting: Cardiology

## 2014-10-03 ENCOUNTER — Telehealth: Payer: Self-pay | Admitting: Cardiology

## 2014-10-03 NOTE — Telephone Encounter (Signed)
**Note De-Identified  Obfuscation** The pts son states that the pt has been SOB for "a while" now and is asking to schedule an appointment with a PA or NP on 10/10/14 as the pt will be coming to the office on that day for a Coumadin check.  I advised the pt that a scheduler will call him back to schedule that appt. He verbalized understanding.

## 2014-10-03 NOTE — Telephone Encounter (Signed)
New Message       Pt's son calling stating mother is having sob. Please advise.

## 2014-10-04 ENCOUNTER — Telehealth: Payer: Self-pay | Admitting: Cardiology

## 2014-10-04 NOTE — Telephone Encounter (Signed)
New Msg  Spoke with pt, she received flu shot at [facility] in [place] in October.

## 2014-10-10 ENCOUNTER — Ambulatory Visit (INDEPENDENT_AMBULATORY_CARE_PROVIDER_SITE_OTHER): Payer: Medicare Other | Admitting: Pharmacist

## 2014-10-10 ENCOUNTER — Other Ambulatory Visit: Payer: Self-pay | Admitting: Cardiology

## 2014-10-10 ENCOUNTER — Encounter: Payer: Self-pay | Admitting: Physician Assistant

## 2014-10-10 ENCOUNTER — Ambulatory Visit (INDEPENDENT_AMBULATORY_CARE_PROVIDER_SITE_OTHER): Payer: Medicare Other | Admitting: Physician Assistant

## 2014-10-10 VITALS — BP 142/79 | HR 82 | Ht 67.0 in | Wt 159.0 lb

## 2014-10-10 DIAGNOSIS — I482 Chronic atrial fibrillation, unspecified: Secondary | ICD-10-CM

## 2014-10-10 DIAGNOSIS — I429 Cardiomyopathy, unspecified: Secondary | ICD-10-CM

## 2014-10-10 DIAGNOSIS — I5032 Chronic diastolic (congestive) heart failure: Secondary | ICD-10-CM | POA: Diagnosis not present

## 2014-10-10 DIAGNOSIS — E785 Hyperlipidemia, unspecified: Secondary | ICD-10-CM

## 2014-10-10 DIAGNOSIS — Z951 Presence of aortocoronary bypass graft: Secondary | ICD-10-CM

## 2014-10-10 DIAGNOSIS — I4891 Unspecified atrial fibrillation: Secondary | ICD-10-CM

## 2014-10-10 DIAGNOSIS — R0602 Shortness of breath: Secondary | ICD-10-CM

## 2014-10-10 DIAGNOSIS — Z7901 Long term (current) use of anticoagulants: Secondary | ICD-10-CM | POA: Diagnosis not present

## 2014-10-10 DIAGNOSIS — I10 Essential (primary) hypertension: Secondary | ICD-10-CM | POA: Diagnosis not present

## 2014-10-10 DIAGNOSIS — R1013 Epigastric pain: Secondary | ICD-10-CM

## 2014-10-10 DIAGNOSIS — I2510 Atherosclerotic heart disease of native coronary artery without angina pectoris: Secondary | ICD-10-CM | POA: Diagnosis not present

## 2014-10-10 DIAGNOSIS — Z5181 Encounter for therapeutic drug level monitoring: Secondary | ICD-10-CM

## 2014-10-10 DIAGNOSIS — I259 Chronic ischemic heart disease, unspecified: Secondary | ICD-10-CM

## 2014-10-10 LAB — CBC WITH DIFFERENTIAL/PLATELET
Basophils Absolute: 0 10*3/uL (ref 0.0–0.1)
Basophils Relative: 0.3 % (ref 0.0–3.0)
EOS PCT: 1.6 % (ref 0.0–5.0)
Eosinophils Absolute: 0.1 10*3/uL (ref 0.0–0.7)
HCT: 44.1 % (ref 36.0–46.0)
Hemoglobin: 14.5 g/dL (ref 12.0–15.0)
LYMPHS ABS: 1.2 10*3/uL (ref 0.7–4.0)
Lymphocytes Relative: 14.8 % (ref 12.0–46.0)
MCHC: 32.8 g/dL (ref 30.0–36.0)
MCV: 97.7 fl (ref 78.0–100.0)
MONOS PCT: 12.8 % — AB (ref 3.0–12.0)
Monocytes Absolute: 1 10*3/uL (ref 0.1–1.0)
Neutro Abs: 5.5 10*3/uL (ref 1.4–7.7)
Neutrophils Relative %: 70.5 % (ref 43.0–77.0)
PLATELETS: 229 10*3/uL (ref 150.0–400.0)
RBC: 4.51 Mil/uL (ref 3.87–5.11)
RDW: 16.3 % — ABNORMAL HIGH (ref 11.5–15.5)
WBC: 7.9 10*3/uL (ref 4.0–10.5)

## 2014-10-10 LAB — BASIC METABOLIC PANEL
BUN: 18 mg/dL (ref 6–23)
CO2: 29 mEq/L (ref 19–32)
Calcium: 9.7 mg/dL (ref 8.4–10.5)
Chloride: 102 mEq/L (ref 96–112)
Creatinine, Ser: 1.1 mg/dL (ref 0.4–1.2)
GFR: 51.51 mL/min — AB (ref >60.00)
Glucose, Bld: 83 mg/dL (ref 70–99)
Potassium: 3.8 mEq/L (ref 3.5–5.1)
SODIUM: 140 meq/L (ref 135–145)

## 2014-10-10 LAB — TSH: TSH: 1.8 u[IU]/mL (ref 0.35–4.50)

## 2014-10-10 LAB — POCT INR: INR: 3.5

## 2014-10-10 LAB — BRAIN NATRIURETIC PEPTIDE: PRO B NATRI PEPTIDE: 217 pg/mL — AB (ref 0.0–100.0)

## 2014-10-10 MED ORDER — PANTOPRAZOLE SODIUM 40 MG PO TBEC: 40.0000 mg | DELAYED_RELEASE_TABLET | Freq: Every day | ORAL | Status: DC

## 2014-10-10 NOTE — Patient Instructions (Addendum)
INCREASE LASIX TO 60 MG TWICE DAILY FOR 3 DAYS THEN GO BACK TO YOUR CURRENT DOSE OF 40 MG TWICE DAILY INCREASE POTASSIUM TO 40 MEQ TWICE DAILY FOR 3 DAYS THEN GO BACK TO YOUR CURRENT DOSE OF 20 MEQ THREE TIMES DAILY  LAB WORK TODAY; BMET, CBC W/DIFF, TSH, BNP  YOU WILL NEED REPEAT BMET IN 1 WEEK DUE TO MED CHANGES   Your physician has requested that you have a lexiscan myoview. For further information please visit [email or web address]. Please follow instruction sheet, as given.  Your physician recommends that you schedule a follow-up appointment in: 3-4 WEEKS WITH DR. Mare Ferrari OR [facility], PAC SAME DAY DR/ BRACKBILL IS IN THE OFFICE  START PROTONIX 40 MG DAILY FOR 1 MONTH THEN AS NEEDED

## 2014-10-10 NOTE — Progress Notes (Signed)
Cardiology Office Note   Date:  10/10/2014   ID:  Livingston, Natasha [birth year], MRN [id number]  PCP:  Darlin Coco, MD  Cardiologist:  Dr. Darlin Coco     History of Present Illness: Natasha Livingston is a 80 y.o. female with a history of CAD, status post CABG in 2008, chronic A. fib, HTN, MVP with mitral regurgitation, pulmonary hypertension, cardiomyopathy with previous EF of 40% (improved to 50% by echocardiogram 03/2014), Coumadin anticoagulation, HTN, HL, hypothyroidism. Last seen by Dr. Mare Ferrari 07/2014.   The patient called in recently with increased dyspnea. She returns for further evaluation.  She is here today with her son.  She notes problems with dyspnea for over a year now.  She has times when she notes increased dyspnea and times when she feels fine.  She has felt more short of breath for the last several weeks.  She is NYHA 3.  She denies chest pain, orthopnea, PND, edema, syncope, increased abdominal girth.  She notes a weight gain of 5-6 lbs in the last year.    Studies:   - LHC (5/08):  LAD 90%, LAD/diagonal bifurcation 95%, D1 90%, proximal RI 99%, proximal circumflex 30-40%, EF 50% with anterior hypokinesia >> s/p CABG (LIMA-LAD, SVG-DX, SVG-OM1)  - Echo (9/13): EF 40%, severe TR, mild MR, PASP 37 mmHg  - Echo (5/15):  EF 50%, mid-apical anterolateral HK, mild to moderate MR, moderate LAE, mild RVE, moderate to severe RAE, moderate TR, PASP 52 mmHg (moderate pulmonary hypertension)  - Nuclear (2/14):  Small, severe, fixed lateral defect consistent with prior infarct, no ischemia, not gated; low risk   Recent Labs: 02/13/2014: Hemoglobin 12.5 03/22/2014: BUN 18; Creatinine 1.1; Potassium 4.3; Pro B Natriuretic peptide (BNP) 396.0*; Sodium 137    Recent Radiology: No results found.    Wt Readings from Last 3 Encounters:  10/10/14 159 lb (72.122 kg)  07/25/14 157 lb (71.215 kg)  03/22/14 155 lb 1.9 oz (70.362 kg)     Past Medical History  Diagnosis  Date  . Chest discomfort   . Dizziness   . MVP (mitral valve prolapse)   . DOE (dyspnea on exertion)   . Bruises easily   . Back pain   . Forgetfulness   . Atrial fibrillation   . Hypertension   . Hypothyroidism   . Aortic stenosis   . Hyperlipidemia   . Diverticulitis   . Interstitial nephritis   . Neuropathy   . Ischemic heart disease     Current Outpatient Prescriptions  Medication Sig Dispense Refill  . ALPRAZolam (XANAX) 0.25 MG tablet TAKE 1/2 TABLET BY MOUTH 2 TIMES A DAY 90 tablet 5  . fexofenadine (ALLEGRA) 60 MG tablet Take 60 mg by mouth 2 (two) times daily as needed.    . furosemide (LASIX) 40 MG tablet TAKE 1 TABLET (40 MG TOTAL) BY MOUTH 2 (TWO) TIMES DAILY. 60 tablet 5  . gabapentin (NEURONTIN) 800 MG tablet TAKE 1/2 TABLET BY MOUTH IN THE MORNING AND 1 TABLET IN THE EVENING 135 tablet 1  . hydrocortisone (ANUSOL-HC) 2.5 % rectal cream Place 1 application rectally 2 (two) times daily as needed for hemorrhoids.    Marland Kitchen levothyroxine (SYNTHROID, LEVOTHROID) 25 MCG tablet TAKE 1 TABLET BY MOUTH EVERY DAY 30 tablet 6  . lisinopril (PRINIVIL,ZESTRIL) 10 MG tablet Take 1 tablet (10 mg total) by mouth daily. 90 tablet 3  . metoprolol succinate (TOPROL-XL) 25 MG 24 hr tablet Take 1 tablet (25 mg total) by  mouth 2 (two) times daily. 60 tablet 11  . nitroGLYCERIN (NITROSTAT) 0.4 MG SL tablet Place 1 tablet (0.4 mg total) under the tongue every 5 (five) minutes as needed for chest pain. 25 tablet PRN  . potassium chloride SA (KLOR-CON M20) 20 MEQ tablet Take 1 tablet (20 mEq total) by mouth 3 (three) times daily. 90 tablet 5  . traZODone (DESYREL) 50 MG tablet Take 25 mg by mouth at bedtime as needed for sleep.     Marland Kitchen warfarin (COUMADIN) 5 MG tablet TAKE 1 TABLET BY MOUTH EVERY DAY OR AS DIRECTED 90 tablet 1   No current facility-administered medications for this visit.     Allergies:   Amiodarone; Crestor; and Lipitor   Social History:  The patient  reports that she has never  smoked. She does not have any smokeless tobacco history on file. She reports that she does not drink alcohol or use illicit drugs.   Family History:  The patient's family history includes Cerebral aneurysm in her father and another family member; Hypertension in her father. There is no history of Heart attack or Stroke.    ROS:  Please see the history of present illness.   She notes epigastric pain at times.  She has this with certain meals.  She thinks she may have it with exertion as well.  She denies melena, hematochezia.   All other systems reviewed and negative.    PHYSICAL EXAM: VS:  BP 142/79 mmHg  Pulse 82  Ht 5\' 7"  (1.702 m)  Wt 159 lb (72.122 kg)  BMI 24.90 kg/m2 Well nourished, well developed, in no acute distress HEENT: normal Neck: + JVD Cardiac:  normal S1, S2; irreg irreg rhythm; no murmur   Lungs:  Decreased breath sounds bilaterally, no wheezing, rhonchi or rales Abd: soft, nontender, no hepatomegaly Ext: no edema Skin: warm and dry Neuro:  CNs 2-12 intact, no focal abnormalities noted  EKG:  Atrial fibrillation, HR 82, rightward axis, marked inferolateral T-wave inversions, no change from prior tracings      ASSESSMENT AND PLAN:  1.  Shortness of breath:  Etiology not entirely clear.  She has episodes of shortness of breath that occur every so often for the past year.  Differential Dx includes diastolic HF, CAD, AFib, pulmonary HTN, deconditioning.  She is adequately anticoagulated.  She has some evidence of volume excess on exam.  O2 sats today normal on RA.      -  Increase Lasix to 60 mg bid x 3 days.  Increase K+ to 40 mEq bid x 3 days, then resume usual dose.      -  Check BMET, CBC, TSH, BNP    -  Arrange Lexiscan Myoview.      -  Consider referral to Pulmonology. 2.  Chronic diastolic CHF (congestive heart failure):  She appears to be somewhat volume overloaded.      -  Increase lasix as noted.    -  Plan repeat BMET 1 week.    -  Reassess LVF with  nuclear study 3.  Chronic atrial fibrillation:  HR appears to be controlled.  We could consider a Holter monitor to assess for rate control if workup unremarkable.   4.  Coronary artery disease:  Proceed with Myoview as noted.  Continue beta blocker.  She is intolerant of statins.  5.  Cardiomyopathy: EF recently noted to return to normal.  Continue beta blocker, ACEI.   6.  Essential hypertension:  Borderline control. Continue  to monitor.  7.  Hyperlipidemia:  Intolerant to statins.   8.  Epigastric Pain:  Question if symptoms related to acid reflux.  Add Protonix 40 mg QD x 1 month, then PRN.    Disposition:   FU with Dr. Darlin Coco or me 3-4 weeks.   Signed, Versie Starks, MHS 10/10/2014 8:56 AM    [facility] [place], [place], [place]  [address] Phone: [phone]; Fax: [phone]

## 2014-10-11 ENCOUNTER — Telehealth: Payer: Self-pay | Admitting: *Deleted

## 2014-10-11 NOTE — Telephone Encounter (Signed)
per Pt request yesterday to call her son Richardson Landry with her lab results. Son Richardson Landry notified today of results with verbal understanding and read back.

## 2014-10-17 ENCOUNTER — Telehealth: Payer: Self-pay | Admitting: *Deleted

## 2014-10-17 ENCOUNTER — Other Ambulatory Visit (INDEPENDENT_AMBULATORY_CARE_PROVIDER_SITE_OTHER): Payer: Medicare Other | Admitting: *Deleted

## 2014-10-17 DIAGNOSIS — I5032 Chronic diastolic (congestive) heart failure: Secondary | ICD-10-CM | POA: Diagnosis not present

## 2014-10-17 DIAGNOSIS — I10 Essential (primary) hypertension: Secondary | ICD-10-CM

## 2014-10-17 LAB — BASIC METABOLIC PANEL
BUN: 20 mg/dL (ref 6–23)
CALCIUM: 9.4 mg/dL (ref 8.4–10.5)
CO2: 28 meq/L (ref 19–32)
Chloride: 101 mEq/L (ref 96–112)
Creatinine, Ser: 1.2 mg/dL (ref 0.4–1.2)
GFR: 46.55 mL/min — ABNORMAL LOW (ref >60.00)
Glucose, Bld: 150 mg/dL — ABNORMAL HIGH (ref 70–99)
Potassium: 3.8 mEq/L (ref 3.5–5.1)
Sodium: 135 mEq/L (ref 135–145)

## 2014-10-17 NOTE — Telephone Encounter (Signed)
pt notified of lab reuslts with verbal understanding

## 2014-10-20 ENCOUNTER — Ambulatory Visit (HOSPITAL_COMMUNITY): Payer: Medicare Other | Attending: Cardiovascular Disease | Admitting: Radiology

## 2014-10-20 DIAGNOSIS — I2510 Atherosclerotic heart disease of native coronary artery without angina pectoris: Secondary | ICD-10-CM | POA: Diagnosis not present

## 2014-10-20 DIAGNOSIS — R0602 Shortness of breath: Secondary | ICD-10-CM | POA: Diagnosis not present

## 2014-10-20 MED ORDER — TECHNETIUM TC 99M SESTAMIBI GENERIC - CARDIOLITE
11.0000 | Freq: Once | INTRAVENOUS | Status: AC | PRN
  Administered 2014-10-20: 11 via INTRAVENOUS

## 2014-10-20 MED ORDER — TECHNETIUM TC 99M SESTAMIBI GENERIC - CARDIOLITE
33.0000 | Freq: Once | INTRAVENOUS | Status: AC | PRN
  Administered 2014-10-20: 33 via INTRAVENOUS

## 2014-10-20 MED ORDER — REGADENOSON 0.4 MG/5ML IV SOLN
0.4000 mg | Freq: Once | INTRAVENOUS | Status: AC
  Administered 2014-10-20: 0.4 mg via INTRAVENOUS

## 2014-10-20 NOTE — Progress Notes (Signed)
[facility] [address] [place], [place] [address] [phone]    Cardiology Nuclear Med Study  Natasha Livingston is a 80 y.o. female     MRN : [id number]     DOB: 09-May-1934  Procedure Date: 10/20/2014  Nuclear Med Background Indication for Stress Test:  Evaluation for Ischemia History:  Afib;CABG;CAD;Previous Nuclear Study 12/22/12 Scar, not gated Cardiac Risk Factors: Carotid Disease and Hypertension  Symptoms:  DOE and SOB   Nuclear Pre-Procedure Caffeine/Decaff Intake:  None NPO After: 4:00 pm   Lungs:  clear O2 Sat: 97% on room air. IV 0.9% NS with Angio Cath:  22g  IV Site: R Antecubital  IV Started by:  Ileene Hutchinson, EMT-P  Chest Size (in):  36 Cup Size: D  Height: 5\' 7"  (1.702 m)  Weight:  152 lb (68.947 kg)  BMI:  Body mass index is 23.8 kg/(m^2). Tech Comments:  No meds taken    Nuclear Med Study 1 or 2 day study: 1 day  Stress Test Type:  Carlton Adam  Reading MD: n/a  Order Authorizing Provider:  T.Autumm Hattery,MD  Resting Radionuclide: Technetium 36m Sestamibi  Resting Radionuclide Dose: 11.0 mCi   Stress Radionuclide:  Technetium 47m Sestamibi  Stress Radionuclide Dose: 33.0 mCi           Stress Protocol Rest HR: 75 Stress HR: 89  Rest BP: 130/104 Stress BP: 126/72  Exercise Time (min): n/a METS: n/a           Dose of Adenosine (mg):  n/a Dose of Lexiscan: 0.4 mg  Dose of Atropine (mg): n/a Dose of Dobutamine: n/a mcg/kg/min (at max HR)  Stress Test Technologist: Glade Lloyd, BS-ES  Nuclear Technologist:  Earl Many, CNMT     Rest Procedure:  Myocardial perfusion imaging was performed at rest 45 minutes following the intravenous administration of Technetium 54m Sestamibi. Rest ECG: Atrial Fibrilliation.  LV strain pattern with inferolateral ST segment downward depression  Stress Procedure:  The patient received IV Lexiscan 0.4 mg over 15-seconds.  Technetium 73m Sestamibi injected at 30-seconds.  Quantitative spect  images were obtained after a 45 minute delay.  During the infusion of Lexiscan the patient complained of head hurting, nausea and weakness.  These symptoms slowly began to resolve in recovery.  Stress ECG: No significant change from baseline ECG  QPS Raw Data Images:  Normal; no motion artifact; normal heart/lung ratio. Stress Images:  There is decreased uptake in the lateral wall. Rest Images:  There is decreased uptake in the lateral wall. Subtraction (SDS):  There is a fixed defect that is most consistent with a previous infarction. Transient Ischemic Dilatation (Normal <1.22):  1.02 Lung/Heart Ratio (Normal <0.45):  0.27  Quantitative Gated Spect Images QGS EDV:  59 ml QGS ESV:  27 ml  Impression Exercise Capacity:  Lexiscan with no exercise. BP Response:  Normal blood pressure response. Clinical Symptoms:  No chest pain. ECG Impression:  EKG uninterpretable because of widespread changes of LV strain at baseline. Comparison with Prior Nuclear Study: No significant change from previous study  Overall Impression:  Low risk stress nuclear study. There is a small fixed defect of moderate intensity involving apical lateral and mid anterolateral myocardium. There is no significant reversibility. SDS 1  LV Ejection Fraction: 54%.  LV Wall Motion:  NL LV Function; NL Wall Motion   Darlin Coco MD

## 2014-10-23 ENCOUNTER — Telehealth: Payer: Self-pay

## 2014-10-23 ENCOUNTER — Encounter: Payer: Self-pay | Admitting: Physician Assistant

## 2014-10-23 NOTE — Telephone Encounter (Signed)
spoke with patient about stress test results. Pt. verbalized understanding and that she needed to continue on current meds.

## 2014-10-23 NOTE — Telephone Encounter (Signed)
-----   Message from Liliane Shi, [place] sent at 10/23/2014  2:10 PM EST ----- No ischemia. Continue with current treatment plan. Richardson Dopp, PA-C   10/23/2014 2:10 PM

## 2014-10-25 ENCOUNTER — Other Ambulatory Visit: Payer: Self-pay | Admitting: Cardiology

## 2014-11-07 ENCOUNTER — Telehealth: Payer: Self-pay | Admitting: Cardiology

## 2014-11-07 DIAGNOSIS — N3010 Interstitial cystitis (chronic) without hematuria: Secondary | ICD-10-CM | POA: Diagnosis not present

## 2014-11-07 DIAGNOSIS — R310 Gross hematuria: Secondary | ICD-10-CM | POA: Diagnosis not present

## 2014-11-07 DIAGNOSIS — N390 Urinary tract infection, site not specified: Secondary | ICD-10-CM | POA: Diagnosis not present

## 2014-11-07 DIAGNOSIS — B952 Enterococcus as the cause of diseases classified elsewhere: Secondary | ICD-10-CM | POA: Diagnosis not present

## 2014-11-07 NOTE — Telephone Encounter (Signed)
New Message      Patient states that Saturday night 11/04/14 she started feeling bad going back and forth to the bathroom and she has had heavy to light today (11/08/14). She says she has had this before and knows it to be a UTI. She would like to speak to someone and get a prescription because she is in pain.   Thank You.

## 2014-11-07 NOTE — Telephone Encounter (Signed)
Spoke with patient and she has appointment with urology this am at 10:45.  Patient had visible blood in urine Saturday and now having burning when urinates  Last INR 10/10/14 INR 3.5  Discussed with Natasha Livingston D and will just have patient call after appointment to update on antibiotic or not Advised patient of recommendation and to keep appointments (OV and INR check) on Friday

## 2014-11-10 ENCOUNTER — Ambulatory Visit (INDEPENDENT_AMBULATORY_CARE_PROVIDER_SITE_OTHER): Payer: Medicare Other | Admitting: Physician Assistant

## 2014-11-10 ENCOUNTER — Ambulatory Visit (INDEPENDENT_AMBULATORY_CARE_PROVIDER_SITE_OTHER): Payer: Medicare Other

## 2014-11-10 ENCOUNTER — Encounter: Payer: Self-pay | Admitting: Physician Assistant

## 2014-11-10 VITALS — BP 132/84 | HR 85 | Ht 67.0 in | Wt 161.0 lb

## 2014-11-10 DIAGNOSIS — R0602 Shortness of breath: Secondary | ICD-10-CM | POA: Diagnosis not present

## 2014-11-10 DIAGNOSIS — I4891 Unspecified atrial fibrillation: Secondary | ICD-10-CM

## 2014-11-10 DIAGNOSIS — I2510 Atherosclerotic heart disease of native coronary artery without angina pectoris: Secondary | ICD-10-CM

## 2014-11-10 DIAGNOSIS — Z5181 Encounter for therapeutic drug level monitoring: Secondary | ICD-10-CM

## 2014-11-10 DIAGNOSIS — E785 Hyperlipidemia, unspecified: Secondary | ICD-10-CM

## 2014-11-10 DIAGNOSIS — I10 Essential (primary) hypertension: Secondary | ICD-10-CM

## 2014-11-10 DIAGNOSIS — I5032 Chronic diastolic (congestive) heart failure: Secondary | ICD-10-CM | POA: Diagnosis not present

## 2014-11-10 DIAGNOSIS — I429 Cardiomyopathy, unspecified: Secondary | ICD-10-CM

## 2014-11-10 DIAGNOSIS — R1013 Epigastric pain: Secondary | ICD-10-CM | POA: Diagnosis not present

## 2014-11-10 DIAGNOSIS — I482 Chronic atrial fibrillation, unspecified: Secondary | ICD-10-CM

## 2014-11-10 LAB — BASIC METABOLIC PANEL
BUN: 21 mg/dL (ref 6–23)
CO2: 27 mEq/L (ref 19–32)
CREATININE: 1.2 mg/dL (ref 0.4–1.2)
Calcium: 9.5 mg/dL (ref 8.4–10.5)
Chloride: 106 mEq/L (ref 96–112)
GFR: 46.09 mL/min — AB (ref >60.00)
Glucose, Bld: 102 mg/dL — ABNORMAL HIGH (ref 70–99)
POTASSIUM: 3.9 meq/L (ref 3.5–5.1)
Sodium: 142 mEq/L (ref 135–145)

## 2014-11-10 MED ORDER — FUROSEMIDE 40 MG PO TABS: 60.0000 mg | ORAL_TABLET | Freq: Two times a day (BID) | ORAL | Status: DC

## 2014-11-10 MED ORDER — POTASSIUM CHLORIDE CRYS ER 20 MEQ PO TBCR: EXTENDED_RELEASE_TABLET | ORAL | Status: DC

## 2014-11-10 NOTE — Patient Instructions (Addendum)
Your physician recommends that you return for lab work in: TODAY, BMET  YOU WILL NEED REPEAT LAB WORK 11/17/14 ANYTIME BETWEEN 7:30 AM AND 4:30 PM  FOLLOW UP WITH DR. Mare Ferrari 12/2014  Your physician has recommended you make the following change in your medication:  1. INCREASE LASIX TO 60 MG TWICE DAILY 2. INCREASE POTASSIUM TO 40 MEQ TWICE DAILY

## 2014-11-10 NOTE — Progress Notes (Signed)
Cardiology Office Note   Date:  11/10/2014   ID:  Natasha Livingston Mar 07, 1934, MRN [id number]  PCP:  Darlin Coco, MD  Cardiologist:  Natasha Livingston     History of Present Illness: Natasha Livingston is a 81 y.o. female with a history of CAD, status post CABG in 2008, chronic A. fib, HTN, MVP with mitral regurgitation, pulmonary hypertension, cardiomyopathy with previous EF of 40% (improved to 50% by echocardiogram 03/2014), Coumadin anticoagulation, HTN, HL, hypothyroidism.    I saw her on 10/10/14 with complaints of dyspnea that would wax and wane over the past year. I adjusted her Lasix for several days due to some evidence of volume excess on exam. I set her up for a Gleason which demonstrated prior scar but no ischemia and normal ejection fraction (low risk). She complained of some epigastric pain. I placed her on PPI. She returns for follow-up.  Epigastric pain is better on PPI. She notes her breathing was better with the higher dose of Lasix.  She did have a 7 lb weight loss.  However, now she feels short of breath again and weight is up 9 lbs.  She sleeps on an incline without change.  She denies PND.  Denies LE edema.  She notes increased abdominal girth.  She denies chest pain, syncope.    Studies:   - LHC (5/08):  LAD 90%, LAD/diagonal bifurcation 95%, D1 90%, proximal RI 99%, proximal circumflex 30-40%, EF 50% with anterior hypokinesia >> s/p CABG (LIMA-LAD, SVG-DX, SVG-OM1)  - Echo (9/13): EF 40%, severe TR, mild MR, PASP 37 mmHg  - Echo (5/15):  EF 50%, mid-apical anterolateral HK, mild to moderate MR, moderate LAE, mild RVE, moderate to severe RAE, moderate TR, PASP 52 mmHg (moderate pulmonary hypertension)  - Nuclear (2/14):  Small, severe, fixed lateral defect consistent with prior infarct, no ischemia, not gated; low risk  - Nuclear (10/23/14): Apical lateral and mid anterolateral infarct, no ischemia, EF 54%, low risk study.   Recent  Labs: 10/10/2014: Hemoglobin 14.5; Pro B Natriuretic peptide (BNP) 217.0*; TSH 1.80 10/17/2014: BUN 20; Creatinine 1.2; Potassium 3.8; Sodium 135    Recent Radiology: No results found.    Wt Readings from Last 3 Encounters:  11/10/14 161 lb (73.029 kg)  10/20/14 152 lb (68.947 kg)  10/10/14 159 lb (72.122 kg)     Past Medical History  Diagnosis Date  . Chest discomfort   . Dizziness   . MVP (mitral valve prolapse)   . DOE (dyspnea on exertion)   . Bruises easily   . Back pain   . Forgetfulness   . Atrial fibrillation   . Hypertension   . Hypothyroidism   . Aortic stenosis   . Hyperlipidemia   . Diverticulitis   . Interstitial nephritis   . Neuropathy   . Ischemic heart disease   . Hx of cardiovascular stress test     Lexiscan Myoview (12/15):  Apical lateral and anterolateral fixed defect, no ischemia, EF 54%; low risk    Current Outpatient Prescriptions  Medication Sig Dispense Refill  . ALPRAZolam (XANAX) 0.25 MG tablet TAKE 1/2 TABLET BY MOUTH 2 TIMES A DAY 90 tablet 5  . fexofenadine (ALLEGRA) 60 MG tablet Take 60 mg by mouth 2 (two) times daily as needed.    . furosemide (LASIX) 40 MG tablet TAKE 1 TABLET (40 MG TOTAL) BY MOUTH 2 (TWO) TIMES DAILY. 60 tablet 5  . gabapentin (NEURONTIN) 800 MG tablet TAKE 1/2 TABLET  BY MOUTH IN THE MORNING AND 1 TABLET IN THE EVENING 135 tablet 1  . hydrocortisone (ANUSOL-HC) 2.5 % rectal cream Place 1 application rectally 2 (two) times daily as needed for hemorrhoids.    Marland Kitchen levothyroxine (SYNTHROID, LEVOTHROID) 25 MCG tablet TAKE 1 TABLET BY MOUTH EVERY DAY 30 tablet 6  . lisinopril (PRINIVIL,ZESTRIL) 10 MG tablet Take 1 tablet (10 mg total) by mouth daily. 90 tablet 3  . metoprolol succinate (TOPROL-XL) 25 MG 24 hr tablet Take 1 tablet (25 mg total) by mouth 2 (two) times daily. 60 tablet 11  . nitroGLYCERIN (NITROSTAT) 0.4 MG SL tablet Place 1 tablet (0.4 mg total) under the tongue every 5 (five) minutes as needed for chest pain.  25 tablet PRN  . pantoprazole (PROTONIX) 40 MG tablet Take 1 tablet (40 mg total) by mouth daily. AFTER 1 MONTH TAKE AS NEEDED 30 tablet 1  . potassium chloride SA (KLOR-CON M20) 20 MEQ tablet TAKE 40 MEQ TWICE DAILY FOR 3 DAYS THEN GO BACK TO YOUR CURRENT DOSE OF 20 MEQ THREE TIMES DAILY 90 tablet 5  . traZODone (DESYREL) 50 MG tablet Take 25 mg by mouth at bedtime as needed for sleep.     Marland Kitchen warfarin (COUMADIN) 5 MG tablet TAKE 1 TABLET BY MOUTH EVERY DAY OR AS DIRECTED 90 tablet 1   No current facility-administered medications for this visit.     Allergies:   Amiodarone; Crestor; and Lipitor   Social History:  The patient  reports that she has never smoked. She does not have any smokeless tobacco history on file. She reports that she does not drink alcohol or use illicit drugs.   Family History:  The patient's family history includes Cerebral aneurysm in her father and another family member; Hypertension in her father. There is no history of Heart attack or Stroke.    ROS:  Please see the history of present illness.   She notes onychomycosis L great toe.  All other systems reviewed and negative.    PHYSICAL EXAM: VS:  BP 132/84 mmHg  Pulse 85  Ht 5\' 7"  (1.702 m)  Wt 161 lb (73.029 kg)  BMI 25.21 kg/m2  SpO2 97% Well nourished, well developed, in no acute distress HEENT: normal Neck: + JVD Cardiac:  normal S1, S2; irreg irreg rhythm; no murmur   Lungs:  Decreased breath sounds bilaterally, no wheezing, rhonchi or rales Abd: soft, nontender, no hepatomegaly Ext: no edema Skin: warm and dry Neuro:  CNs 2-12 intact, no focal abnormalities noted  EKG:  AFib, HR 85, normal axis, inf-lat TWI, no change from prior tracings  ASSESSMENT AND PLAN:  1.  Chronic diastolic CHF (congestive heart failure):  She notes improved dyspnea on higher doses of Lasix.  She appears volume overloaded and her weight is up again.  Some of her dyspnea is likely explained by inactivity as well. She  mainly stays at home and does not retrieve mail, clean, etc.  She is fairly sedentary.      -  Increase Lasix to 60 mg BID, and K+ to 40 mEq  Bid.    -  BMET today and repeat in 1 week.    -  Increase activity.  3.  Chronic atrial fibrillation:  Rate controlled.  Continue current dose of beta blocker.  FU with the coumadin clinic as planned.    4.  Coronary artery disease:   Recent Myoview low risk. No further ischemic evaluation.    -  Continue beta blocker.      -  She is intolerant of statins.  5.  Cardiomyopathy: EF normal on nuclear study .  Continue beta blocker, ACEI.   6.  Essential hypertension:  Controlled.  7.  Hyperlipidemia:   Intolerant to statins.   8.  Epigastric Pain:  Improved.  She can take PPI prn.  If symptoms worsen, consider referral to GI.      Disposition:   FU with Natasha Livingston as planned next month.   Signed, Versie Starks, MHS 11/10/2014 10:09 AM    [facility] [place], [place], [place]  [address] Phone: [phone]; Fax: [phone]

## 2014-11-17 ENCOUNTER — Other Ambulatory Visit (INDEPENDENT_AMBULATORY_CARE_PROVIDER_SITE_OTHER): Payer: Medicare Other | Admitting: *Deleted

## 2014-11-17 DIAGNOSIS — I10 Essential (primary) hypertension: Secondary | ICD-10-CM | POA: Diagnosis not present

## 2014-11-17 DIAGNOSIS — I5032 Chronic diastolic (congestive) heart failure: Secondary | ICD-10-CM

## 2014-11-17 LAB — BASIC METABOLIC PANEL
BUN: 24 mg/dL — AB (ref 6–23)
CHLORIDE: 101 meq/L (ref 96–112)
CO2: 32 meq/L (ref 19–32)
CREATININE: 1.12 mg/dL (ref 0.40–1.20)
Calcium: 10 mg/dL (ref 8.4–10.5)
GFR: 49.91 mL/min — ABNORMAL LOW (ref >60.00)
Glucose, Bld: 110 mg/dL — ABNORMAL HIGH (ref 70–99)
POTASSIUM: 3.8 meq/L (ref 3.5–5.1)
SODIUM: 138 meq/L (ref 135–145)

## 2014-11-23 ENCOUNTER — Telehealth: Payer: Self-pay | Admitting: Cardiology

## 2014-11-23 NOTE — Telephone Encounter (Signed)
New Message         Pt calling stating that Dr. Mare Ferrari is her PCP and she has come down with a very bad cold and is wanting to know if he can call in a rx for her. Please call back and advise.

## 2014-11-23 NOTE — Telephone Encounter (Signed)
Spoke with patient and she stated she had been sneezing and having a runny nose for about a week Denies cough, sore throat, cough, or fever.  Recommended trying Mucinex plain Patient wanted for something to be phoned in. Explained to patient  Dr. Mare Ferrari not in the office until next week and antibiotics not recommended for "colds". If she feels she needs antibiotic go to Urgent Care Did encourage patient to get PCP Patient verbalized understanding

## 2014-12-01 ENCOUNTER — Ambulatory Visit (INDEPENDENT_AMBULATORY_CARE_PROVIDER_SITE_OTHER): Payer: Medicare Other | Admitting: *Deleted

## 2014-12-01 ENCOUNTER — Encounter: Payer: Self-pay | Admitting: *Deleted

## 2014-12-01 ENCOUNTER — Other Ambulatory Visit: Payer: Self-pay | Admitting: Physician Assistant

## 2014-12-01 DIAGNOSIS — I4891 Unspecified atrial fibrillation: Secondary | ICD-10-CM | POA: Diagnosis not present

## 2014-12-01 DIAGNOSIS — Z5181 Encounter for therapeutic drug level monitoring: Secondary | ICD-10-CM | POA: Diagnosis not present

## 2014-12-01 LAB — POCT INR: INR: 2.2

## 2014-12-15 DIAGNOSIS — R3989 Other symptoms and signs involving the genitourinary system: Secondary | ICD-10-CM | POA: Diagnosis not present

## 2014-12-19 ENCOUNTER — Other Ambulatory Visit: Payer: Self-pay | Admitting: Cardiology

## 2014-12-21 ENCOUNTER — Ambulatory Visit (INDEPENDENT_AMBULATORY_CARE_PROVIDER_SITE_OTHER): Payer: Medicare Other | Admitting: Cardiology

## 2014-12-21 ENCOUNTER — Encounter: Payer: Self-pay | Admitting: Cardiology

## 2014-12-21 ENCOUNTER — Ambulatory Visit (INDEPENDENT_AMBULATORY_CARE_PROVIDER_SITE_OTHER): Payer: Medicare Other | Admitting: *Deleted

## 2014-12-21 VITALS — BP 116/74 | HR 79 | Ht 67.0 in | Wt 159.6 lb

## 2014-12-21 DIAGNOSIS — I4891 Unspecified atrial fibrillation: Secondary | ICD-10-CM | POA: Diagnosis not present

## 2014-12-21 DIAGNOSIS — I5032 Chronic diastolic (congestive) heart failure: Secondary | ICD-10-CM | POA: Diagnosis not present

## 2014-12-21 DIAGNOSIS — E785 Hyperlipidemia, unspecified: Secondary | ICD-10-CM | POA: Diagnosis not present

## 2014-12-21 DIAGNOSIS — I2510 Atherosclerotic heart disease of native coronary artery without angina pectoris: Secondary | ICD-10-CM

## 2014-12-21 DIAGNOSIS — I482 Chronic atrial fibrillation, unspecified: Secondary | ICD-10-CM

## 2014-12-21 DIAGNOSIS — Z5181 Encounter for therapeutic drug level monitoring: Secondary | ICD-10-CM

## 2014-12-21 LAB — POCT INR: INR: 2.8

## 2014-12-21 NOTE — Progress Notes (Signed)
Cardiology Office Note   Date:  12/21/2014   ID:  Natasha Livingston, Natasha Livingston 06/08/1934, MRN [id number]  PCP:  Darlin Coco, MD  Cardiologist:   Darlin Coco, MD   No chief complaint on file.     History of Present Illness: Natasha Livingston is a 81 y.o. female who presents for follow-up office visit.  This pleasant 81 year old woman is seen for a followup office visit. She has a history of chronic atrial fibrillation. History of high blood pressure.  She has had remote CABG on 04/12/2007 by Dr. Darcey Nora. She has a past history of mitral regurgitation secondary to mitral valve prolapse. She also has a history of hypothyroidism and a history of essential hypertension.  The patient had an echocardiogram on 03/24/14 which showed an ejection fraction of 50% which was an improvement from the previous echo of 40%.  It was felt that some of this improvement was secondary to improvement in tachycardia mediated cardiomyopathy. The patient had a Myoview stress test on 10/23/14 which showed no ischemia and her ejection fraction on that study was 54%. The patient has improved since we increased her furosemide to 60 mg twice a day.  We are treating her for diastolic dysfunction.  She remains in atrial fibrillation. She is on Coumadin.  Past Medical History  Diagnosis Date  . Chest discomfort   . Dizziness   . MVP (mitral valve prolapse)   . DOE (dyspnea on exertion)   . Bruises easily   . Back pain   . Forgetfulness   . Atrial fibrillation   . Hypertension   . Hypothyroidism   . Aortic stenosis   . Hyperlipidemia   . Diverticulitis   . Interstitial nephritis   . Neuropathy   . Ischemic heart disease   . Hx of cardiovascular stress test     Lexiscan Myoview (12/15):  Apical lateral and anterolateral fixed defect, no ischemia, EF 54%; low risk    Past Surgical History  Procedure Laterality Date  . Coronary artery bypass graft  04/2007  . Cardioversion  08/2008  . Ovarian cyst  removal       Current Outpatient Prescriptions  Medication Sig Dispense Refill  . ALPRAZolam (XANAX) 0.25 MG tablet TAKE A HALF TABLET BY MOUTH TWICE A DAY 90 tablet 0  . fexofenadine (ALLEGRA) 60 MG tablet Take 60 mg by mouth 2 (two) times daily as needed.    . furosemide (LASIX) 40 MG tablet Take 1.5 tablets (60 mg total) by mouth 2 (two) times daily. 90 tablet 5  . gabapentin (NEURONTIN) 800 MG tablet TAKE 1/2 TABLET BY MOUTH IN THE MORNING AND 1 TABLET IN THE EVENING 135 tablet 1  . hydrocortisone (ANUSOL-HC) 2.5 % rectal cream Place 1 application rectally 2 (two) times daily as needed for hemorrhoids.    Marland Kitchen levothyroxine (SYNTHROID, LEVOTHROID) 25 MCG tablet TAKE 1 TABLET BY MOUTH EVERY DAY 30 tablet 6  . lisinopril (PRINIVIL,ZESTRIL) 10 MG tablet Take 1 tablet (10 mg total) by mouth daily. 90 tablet 3  . metoprolol succinate (TOPROL-XL) 25 MG 24 hr tablet Take 1 tablet (25 mg total) by mouth 2 (two) times daily. 60 tablet 11  . nitroGLYCERIN (NITROSTAT) 0.4 MG SL tablet Place 1 tablet (0.4 mg total) under the tongue every 5 (five) minutes as needed for chest pain. 25 tablet PRN  . pantoprazole (PROTONIX) 40 MG tablet Take 1 tablet (40 mg total) by mouth as needed. 30 tablet 0  . potassium chloride SA (  KLOR-CON M20) 20 MEQ tablet TAKE 40 MEQ TWICE DAILY 112 tablet 5  . Skin Protectants, Misc. (EUCERIN) cream Apply topically daily.    . traZODone (DESYREL) 50 MG tablet Take 25 mg by mouth at bedtime as needed for sleep.     Marland Kitchen warfarin (COUMADIN) 5 MG tablet TAKE 1 TABLET BY MOUTH EVERY DAY OR AS DIRECTED 90 tablet 1   No current facility-administered medications for this visit.    Allergies:   Amiodarone; Crestor; and Lipitor    Social History:  The patient  reports that she has never smoked. She does not have any smokeless tobacco history on file. She reports that she does not drink alcohol or use illicit drugs.   Family History:  The patient's family history includes Cerebral  aneurysm in her father and another family member; Hypertension in her father. There is no history of Heart attack or Stroke.    ROS:  Please see the history of present illness.   Otherwise, review of systems are positive for none.   All other systems are reviewed and negative.    PHYSICAL EXAM: VS:  BP 116/74 mmHg  Pulse 79  Ht 5\' 7"  (1.702 m)  Wt 159 lb 9.6 oz (72.394 kg)  BMI 24.99 kg/m2 , BMI Body mass index is 24.99 kg/(m^2). GEN: Well nourished, well developed, in no acute distress HEENT: normal Neck: no JVD, carotid bruits, or masses Cardiac: Irregular pulse.  Soft apical systolic murmur.  No rubs, or gallops,no edema  Respiratory:  clear to auscultation bilaterally, normal work of breathing GI: soft, nontender, nondistended, + BS MS: no deformity or atrophy Skin: warm and dry, no rash Neuro:  Strength and sensation are intact Psych: euthymic mood, full affect   EKG:  EKG is not ordered today.    Recent Labs: 10/10/2014: Hemoglobin 14.5; Platelets 229.0; Pro B Natriuretic peptide (BNP) 217.0*; TSH 1.80 11/17/2014: BUN 24*; Creatinine 1.12; Potassium 3.8; Sodium 138    Lipid Panel    Component Value Date/Time   CHOL 184 10/19/2012 1043   TRIG 113.0 10/19/2012 1043   HDL 40.10 10/19/2012 1043   CHOLHDL 5 10/19/2012 1043   VLDL 22.6 10/19/2012 1043   LDLCALC 121* 10/19/2012 1043      Wt Readings from Last 3 Encounters:  12/21/14 159 lb 9.6 oz (72.394 kg)  11/10/14 161 lb (73.029 kg)  10/20/14 152 lb (68.947 kg)         ASSESSMENT AND PLAN:  1. Chronic diastolic CHF (congestive heart failure):    Doing better on higher dose Lasix 60 mg twice a day 3. Chronic atrial fibrillation: Rate controlled. Continue current dose of beta blocker. FU with the coumadin clinic as planned.  4. Coronary artery disease: Recent Myoview low risk. No further ischemic evaluation.  - Continue beta blocker.   - She is intolerant of statins.  5.  Cardiomyopathy: EF normal on nuclear study . Continue beta blocker, ACEI.  6. Essential hypertension: Controlled.  7. Hyperlipidemia: Intolerant to statins.  8. Epigastric Pain: Improved. She can take PPI prn. If symptoms worsen, consider referral to GI.    Current medicines are reviewed at length with the patient today.  The patient does not have concerns regarding medicines.  The following changes have been made:  no change  Labs/ tests ordered today include:  No orders of the defined types were placed in this encounter.   She is having problems with dry skin.  We've advised her to try using Eucerin cream as  a skin lubricant. Continue current cardiac meds.  Disposition:   FU with Dr. Mare Ferrari in 4 months for office visit and EKG   Signed, Darlin Coco, MD  12/21/2014 5:30 PM    [address], [place], [place]  [address] Phone: [phone]; Fax: [phone]

## 2014-12-21 NOTE — Patient Instructions (Signed)
TRY EUCERIN CREAM FOR DRY/ITCHY SKIN  Your physician recommends that you schedule a follow-up appointment in: [place]

## 2014-12-30 ENCOUNTER — Other Ambulatory Visit: Payer: Self-pay | Admitting: Cardiology

## 2015-01-19 ENCOUNTER — Ambulatory Visit (INDEPENDENT_AMBULATORY_CARE_PROVIDER_SITE_OTHER): Payer: Medicare Other | Admitting: *Deleted

## 2015-01-19 DIAGNOSIS — I4891 Unspecified atrial fibrillation: Secondary | ICD-10-CM

## 2015-01-19 DIAGNOSIS — T798XXA Other early complications of trauma, initial encounter: Secondary | ICD-10-CM | POA: Diagnosis not present

## 2015-01-19 DIAGNOSIS — Z5181 Encounter for therapeutic drug level monitoring: Secondary | ICD-10-CM

## 2015-01-19 DIAGNOSIS — I482 Chronic atrial fibrillation, unspecified: Secondary | ICD-10-CM

## 2015-01-19 LAB — POCT INR: INR: 3

## 2015-01-22 ENCOUNTER — Other Ambulatory Visit: Payer: Self-pay | Admitting: Cardiology

## 2015-01-22 DIAGNOSIS — B9562 Methicillin resistant Staphylococcus aureus infection as the cause of diseases classified elsewhere: Secondary | ICD-10-CM | POA: Diagnosis not present

## 2015-01-22 DIAGNOSIS — Z7901 Long term (current) use of anticoagulants: Secondary | ICD-10-CM | POA: Diagnosis not present

## 2015-01-22 DIAGNOSIS — G629 Polyneuropathy, unspecified: Secondary | ICD-10-CM

## 2015-01-22 DIAGNOSIS — T1490 Injury, unspecified: Secondary | ICD-10-CM | POA: Diagnosis not present

## 2015-02-16 DIAGNOSIS — H02105 Unspecified ectropion of left lower eyelid: Secondary | ICD-10-CM | POA: Diagnosis not present

## 2015-02-16 DIAGNOSIS — D2312 Other benign neoplasm of skin of left eyelid, including canthus: Secondary | ICD-10-CM | POA: Diagnosis not present

## 2015-02-23 ENCOUNTER — Ambulatory Visit (INDEPENDENT_AMBULATORY_CARE_PROVIDER_SITE_OTHER): Payer: Medicare Other | Admitting: *Deleted

## 2015-02-23 DIAGNOSIS — I4891 Unspecified atrial fibrillation: Secondary | ICD-10-CM

## 2015-02-23 DIAGNOSIS — I482 Chronic atrial fibrillation, unspecified: Secondary | ICD-10-CM

## 2015-02-23 DIAGNOSIS — Z5181 Encounter for therapeutic drug level monitoring: Secondary | ICD-10-CM

## 2015-02-23 LAB — POCT INR: INR: 2.5

## 2015-03-16 ENCOUNTER — Other Ambulatory Visit: Payer: Self-pay | Admitting: Cardiology

## 2015-03-20 ENCOUNTER — Other Ambulatory Visit: Payer: Self-pay | Admitting: Cardiology

## 2015-03-20 DIAGNOSIS — F419 Anxiety disorder, unspecified: Secondary | ICD-10-CM

## 2015-03-20 NOTE — Telephone Encounter (Signed)
Called Rx to pharmacy

## 2015-03-26 ENCOUNTER — Other Ambulatory Visit: Payer: Self-pay | Admitting: Cardiology

## 2015-03-28 ENCOUNTER — Other Ambulatory Visit: Payer: Self-pay | Admitting: Cardiology

## 2015-04-06 ENCOUNTER — Other Ambulatory Visit: Payer: Self-pay | Admitting: *Deleted

## 2015-04-06 ENCOUNTER — Ambulatory Visit (INDEPENDENT_AMBULATORY_CARE_PROVIDER_SITE_OTHER): Payer: Medicare Other | Admitting: *Deleted

## 2015-04-06 DIAGNOSIS — Z5181 Encounter for therapeutic drug level monitoring: Secondary | ICD-10-CM

## 2015-04-06 DIAGNOSIS — G629 Polyneuropathy, unspecified: Secondary | ICD-10-CM

## 2015-04-06 DIAGNOSIS — I482 Chronic atrial fibrillation, unspecified: Secondary | ICD-10-CM

## 2015-04-06 DIAGNOSIS — I5032 Chronic diastolic (congestive) heart failure: Secondary | ICD-10-CM

## 2015-04-06 DIAGNOSIS — I4891 Unspecified atrial fibrillation: Secondary | ICD-10-CM | POA: Diagnosis not present

## 2015-04-06 LAB — POCT INR: INR: 2.1

## 2015-04-06 MED ORDER — METOPROLOL SUCCINATE ER 25 MG PO TB24: 25.0000 mg | ORAL_TABLET | Freq: Two times a day (BID) | ORAL | Status: DC

## 2015-04-06 MED ORDER — GABAPENTIN 800 MG PO TABS: ORAL_TABLET | ORAL | Status: DC

## 2015-04-06 MED ORDER — POTASSIUM CHLORIDE CRYS ER 20 MEQ PO TBCR: EXTENDED_RELEASE_TABLET | ORAL | Status: DC

## 2015-04-06 MED ORDER — LISINOPRIL 10 MG PO TABS: 10.0000 mg | ORAL_TABLET | Freq: Every day | ORAL | Status: DC

## 2015-04-27 ENCOUNTER — Encounter: Payer: Self-pay | Admitting: Cardiology

## 2015-04-27 ENCOUNTER — Ambulatory Visit (INDEPENDENT_AMBULATORY_CARE_PROVIDER_SITE_OTHER): Payer: Medicare Other | Admitting: Cardiology

## 2015-04-27 VITALS — BP 128/80 | HR 75 | Ht 67.0 in | Wt 161.8 lb

## 2015-04-27 DIAGNOSIS — I5032 Chronic diastolic (congestive) heart failure: Secondary | ICD-10-CM

## 2015-04-27 DIAGNOSIS — R0602 Shortness of breath: Secondary | ICD-10-CM | POA: Diagnosis not present

## 2015-04-27 DIAGNOSIS — I482 Chronic atrial fibrillation, unspecified: Secondary | ICD-10-CM

## 2015-04-27 DIAGNOSIS — Z79899 Other long term (current) drug therapy: Secondary | ICD-10-CM

## 2015-04-27 DIAGNOSIS — I2510 Atherosclerotic heart disease of native coronary artery without angina pectoris: Secondary | ICD-10-CM

## 2015-04-27 DIAGNOSIS — I119 Hypertensive heart disease without heart failure: Secondary | ICD-10-CM | POA: Diagnosis not present

## 2015-04-27 NOTE — Progress Notes (Signed)
Cardiology Office Note   Date:  04/27/2015   ID:  Natasha Livingston 1934/08/25, MRN [id number]  PCP:  Warren Danes, MD  Cardiologist: Darlin Coco MD  No chief complaint on file.     History of Present Illness: Natasha Livingston is a 81 y.o. female who presents for scheduled four-month visit  This pleasant 81 year old woman is seen for a followup office visit. She has a history of chronic atrial fibrillation. History of high blood pressure. She has had remote CABG on 04/12/2007 by Dr. Darcey Nora. She has a past history of mitral regurgitation secondary to mitral valve prolapse. She also has a history of hypothyroidism and a history of essential hypertension.  The patient had an echocardiogram on 03/24/14 which showed an ejection fraction of 50% which was an improvement from the previous echo of 40%. It was felt that some of this improvement was secondary to improvement in tachycardia mediated cardiomyopathy. The patient had a Myoview stress test on 10/23/14 which showed no ischemia and her ejection fraction on that study was 54%.  Her last echocardiogram on 03/24/14 showed an ejection fraction of 50% with mild to moderate mitral regurgitation and moderate tricuspid regurgitation. The patient has improved since we increased her furosemide to 60 mg twice a day. We are treating her for diastolic dysfunction. She remains in atrial fibrillation. She is on Coumadin.  She has not had any TIA or stroke symptoms. She still complains about lack of energy.  She will be talking to her PCP about possible B12 injections.  I have no objection to that.   Past Medical History  Diagnosis Date  . Chest discomfort   . Dizziness   . MVP (mitral valve prolapse)   . DOE (dyspnea on exertion)   . Bruises easily   . Back pain   . Forgetfulness   . Atrial fibrillation   . Hypertension   . Hypothyroidism   . Aortic stenosis   . Hyperlipidemia   . Diverticulitis   . Interstitial nephritis     . Neuropathy   . Ischemic heart disease   . Hx of cardiovascular stress test     Lexiscan Myoview (12/15):  Apical lateral and anterolateral fixed defect, no ischemia, EF 54%; low risk    Past Surgical History  Procedure Laterality Date  . Coronary artery bypass graft  04/2007  . Cardioversion  08/2008  . Ovarian cyst removal       Current Outpatient Prescriptions  Medication Sig Dispense Refill  . ALPRAZolam (XANAX) 0.25 MG tablet TAKE A HALF TABLET BY MOUTH TWICE A DAY 90 tablet 0  . fexofenadine (ALLEGRA) 60 MG tablet Take 60 mg by mouth 2 (two) times daily as needed (allergies).     . furosemide (LASIX) 40 MG tablet Take 1.5 tablets (60 mg total) by mouth 2 (two) times daily. 90 tablet 5  . gabapentin (NEURONTIN) 800 MG tablet TAKE 1/2 TABLET BY MOUTH IN THE MORNING AND 1 TABLET IN THE EVENING 135 tablet 3  . hydrocortisone (ANUSOL-HC) 2.5 % rectal cream Place 1 application rectally 2 (two) times daily as needed for hemorrhoids.    Marland Kitchen levothyroxine (SYNTHROID, LEVOTHROID) 25 MCG tablet TAKE 1 TABLET BY MOUTH EVERY DAY 30 tablet 3  . lisinopril (PRINIVIL,ZESTRIL) 10 MG tablet Take 1 tablet (10 mg total) by mouth daily. 90 tablet 3  . metoprolol succinate (TOPROL-XL) 25 MG 24 hr tablet Take 1 tablet (25 mg total) by mouth 2 (two) times daily. 180 tablet  3  . nitroGLYCERIN (NITROSTAT) 0.4 MG SL tablet Place 1 tablet (0.4 mg total) under the tongue every 5 (five) minutes as needed for chest pain. 25 tablet PRN  . potassium chloride SA (KLOR-CON M20) 20 MEQ tablet TAKE 40 MEQ TWICE DAILY 360 tablet 3  . Skin Protectants, Misc. (EUCERIN) cream Apply topically daily.    Marland Kitchen warfarin (COUMADIN) 5 MG tablet TAKE 1 TABLET BY MOUTH EVERY DAY OR AS DIRECTED 90 tablet 1   No current facility-administered medications for this visit.    Allergies:   Amiodarone; Crestor; and Lipitor    Social History:  The patient  reports that she has never smoked. She does not have any smokeless tobacco  history on file. She reports that she does not drink alcohol or use illicit drugs.   Family History:  The patient's family history includes Cerebral aneurysm in her father and another family member; Hypertension in her father. There is no history of Heart attack or Stroke.    ROS:  Please see the history of present illness.   Otherwise, review of systems are positive for none.   All other systems are reviewed and negative.    PHYSICAL EXAM: VS:  BP 128/80 mmHg  Pulse 75  Ht 5\' 7"  (1.702 m)  Wt 161 lb 12.8 oz (73.392 kg)  BMI 25.34 kg/m2 , BMI Body mass index is 25.34 kg/(m^2). GEN: Well nourished, well developed, in no acute distress HEENT: normal Neck: no JVD, carotid bruits, or masses Cardiac: Irregularly irregular pulse.;  There is a grade 2/6 holosystolic murmur at lower left sternal edge.  No rubs, or gallops,no edema  Respiratory:  clear to auscultation bilaterally, normal work of breathing GI: soft, nontender, nondistended, + BS MS: no deformity or atrophy Skin: warm and dry, no rash Neuro:  Strength and sensation are intact Psych: euthymic mood, full affect   EKG:  EKG is ordered today. The ekg ordered today demonstrates atrial fibrillation with ventricular rate of 75 bpm.  There is rightward axis.  There are widespread ST-T changes.   Recent Labs: 10/10/2014: Hemoglobin 14.5; Platelets 229.0; Pro B Natriuretic peptide (BNP) 217.0*; TSH 1.80 11/17/2014: BUN 24*; Creatinine, Ser 1.12; Potassium 3.8; Sodium 138    Lipid Panel    Component Value Date/Time   CHOL 184 10/19/2012 1043   TRIG 113.0 10/19/2012 1043   HDL 40.10 10/19/2012 1043   CHOLHDL 5 10/19/2012 1043   VLDL 22.6 10/19/2012 1043   LDLCALC 121* 10/19/2012 1043      Wt Readings from Last 3 Encounters:  04/27/15 161 lb 12.8 oz (73.392 kg)  12/21/14 159 lb 9.6 oz (72.394 kg)  11/10/14 161 lb (73.029 kg)         ASSESSMENT AND PLAN:         1. Chronic diastolic CHF (congestive heart  failure):    Doing better on higher dose Lasix 60 mg twice a day 3. Chronic atrial fibrillation: Rate controlled. Continue current dose of beta blocker. FU with the coumadin clinic as planned.  4. Coronary artery disease: Recent Myoview low risk. No further ischemic evaluation.  - Continue beta blocker.   - She is intolerant of statins.  5. Cardiomyopathy: EF normal on nuclear study . Continue beta blocker, ACEI.  6. Essential hypertension: Controlled.  7. Hyperlipidemia: Intolerant to statins.  8. Epigastric Pain: Improved. She can take PPI prn. If symptoms worsen, consider referral to GI.  Current medicines are reviewed at length with the patient today.  The patient  does not have concerns regarding medicines.  The following changes have been made:  no change  Labs/ tests ordered today include:   Orders Placed This Encounter  Procedures  . Lipid panel  . Hepatic function panel  . Basic metabolic panel  . CBC with Differential/Platelet  . EKG 12-Lead    Disposition: Continue current medication.  Recheck in 4 months for office visit CBC lipid panel hepatic function panel and basal metabolic panel. She is also in the process of establishing with a primary care physician in [place], Darlin Coco MD 04/27/2015 1:11 PM    [facility] [address], [place], [place]  [address] Phone: [phone]; Fax: [phone]

## 2015-04-27 NOTE — Patient Instructions (Signed)
Medication Instructions:  Your physician recommends that you continue on your current medications as directed. Please refer to the Current Medication list given to you today.  Labwork: NONE  Testing/Procedures: NONE  Follow-Up: Your physician wants you to follow-up in: 4 months with fasting labs (lp/bmet/hfp/CBC)  You will receive a reminder letter in the mail two months in advance. If you don't receive a letter, please call our office to schedule the follow-up appointment.  Any Other Special Instructions Will Be Listed Below (If Applicable).

## 2015-04-30 ENCOUNTER — Other Ambulatory Visit: Payer: Self-pay

## 2015-05-18 ENCOUNTER — Ambulatory Visit (INDEPENDENT_AMBULATORY_CARE_PROVIDER_SITE_OTHER): Payer: Medicare Other | Admitting: Pharmacist

## 2015-05-18 ENCOUNTER — Encounter

## 2015-05-18 ENCOUNTER — Other Ambulatory Visit: Payer: Self-pay | Admitting: Physician Assistant

## 2015-05-18 DIAGNOSIS — I482 Chronic atrial fibrillation, unspecified: Secondary | ICD-10-CM

## 2015-05-18 DIAGNOSIS — Z5181 Encounter for therapeutic drug level monitoring: Secondary | ICD-10-CM

## 2015-05-18 DIAGNOSIS — I4891 Unspecified atrial fibrillation: Secondary | ICD-10-CM

## 2015-05-18 LAB — POCT INR: INR: 2.2

## 2015-05-31 DIAGNOSIS — H02105 Unspecified ectropion of left lower eyelid: Secondary | ICD-10-CM | POA: Diagnosis not present

## 2015-05-31 DIAGNOSIS — D485 Neoplasm of uncertain behavior of skin: Secondary | ICD-10-CM | POA: Diagnosis not present

## 2015-05-31 DIAGNOSIS — H04222 Epiphora due to insufficient drainage, left lacrimal gland: Secondary | ICD-10-CM | POA: Diagnosis not present

## 2015-06-15 ENCOUNTER — Other Ambulatory Visit: Payer: Self-pay | Admitting: Ophthalmology

## 2015-06-15 DIAGNOSIS — D2312 Other benign neoplasm of skin of left eyelid, including canthus: Secondary | ICD-10-CM | POA: Diagnosis not present

## 2015-06-15 DIAGNOSIS — D485 Neoplasm of uncertain behavior of skin: Secondary | ICD-10-CM | POA: Diagnosis not present

## 2015-06-15 DIAGNOSIS — D3102 Benign neoplasm of left conjunctiva: Secondary | ICD-10-CM | POA: Diagnosis not present

## 2015-06-15 DIAGNOSIS — L821 Other seborrheic keratosis: Secondary | ICD-10-CM | POA: Diagnosis not present

## 2015-06-22 ENCOUNTER — Telehealth: Payer: Self-pay

## 2015-06-22 ENCOUNTER — Other Ambulatory Visit: Payer: Self-pay | Admitting: Cardiology

## 2015-06-22 ENCOUNTER — Other Ambulatory Visit: Payer: Self-pay | Admitting: *Deleted

## 2015-06-22 DIAGNOSIS — F419 Anxiety disorder, unspecified: Secondary | ICD-10-CM

## 2015-06-22 NOTE — Telephone Encounter (Signed)
Patients son is calling for refill on alprazolam sent to [facility]

## 2015-06-22 NOTE — Telephone Encounter (Signed)
Called in Rx as requested

## 2015-06-22 NOTE — Telephone Encounter (Signed)
Agree 

## 2015-06-29 ENCOUNTER — Ambulatory Visit (INDEPENDENT_AMBULATORY_CARE_PROVIDER_SITE_OTHER): Payer: Medicare Other | Admitting: *Deleted

## 2015-06-29 DIAGNOSIS — I4891 Unspecified atrial fibrillation: Secondary | ICD-10-CM

## 2015-06-29 DIAGNOSIS — Z5181 Encounter for therapeutic drug level monitoring: Secondary | ICD-10-CM | POA: Diagnosis not present

## 2015-06-29 DIAGNOSIS — I482 Chronic atrial fibrillation, unspecified: Secondary | ICD-10-CM

## 2015-06-29 LAB — POCT INR: INR: 2

## 2015-07-06 ENCOUNTER — Other Ambulatory Visit: Payer: Self-pay | Admitting: Cardiology

## 2015-07-06 NOTE — Telephone Encounter (Signed)
Medication was removed from patients list at her June visit. Please advise on refill. Thanks, MI

## 2015-07-11 ENCOUNTER — Telehealth: Payer: Self-pay

## 2015-07-11 ENCOUNTER — Other Ambulatory Visit: Payer: Self-pay | Admitting: Cardiology

## 2015-07-11 NOTE — Telephone Encounter (Signed)
Natasha Coco, MD at 07/11/2015 12:55 PM     Status: Signed       Expand All Collapse All   OK to refill Protonix

## 2015-07-11 NOTE — Telephone Encounter (Signed)
Discontinue Date:  04/27/2015 1022 Discontinue User:  Tod Persia, CMA Discontinue Reason:  --   Written Date:  01/01/15 Expiration Date:  01/01/16     Start Date:  01/01/15 End Date:  04/27/15     Ordering Provider:  Darlin Coco, MD Authorizing Provider:  Darlin Coco, MD Ordering User:  Kalman Shan

## 2015-07-11 NOTE — Telephone Encounter (Signed)
Refilled as requested  

## 2015-07-11 NOTE — Telephone Encounter (Signed)
OK to refill Protonix

## 2015-07-28 ENCOUNTER — Other Ambulatory Visit: Payer: Self-pay | Admitting: Physician Assistant

## 2015-07-28 ENCOUNTER — Other Ambulatory Visit: Payer: Self-pay | Admitting: Cardiology

## 2015-07-30 ENCOUNTER — Other Ambulatory Visit: Payer: Self-pay | Admitting: *Deleted

## 2015-07-30 MED ORDER — WARFARIN SODIUM 5 MG PO TABS: ORAL_TABLET | ORAL | Status: DC

## 2015-07-30 NOTE — Telephone Encounter (Signed)
Please advise on gabapentin and levothyroxine refill. I routed a separate refill encounter to the anticoag refill pool. Thanks, MI

## 2015-07-30 NOTE — Telephone Encounter (Signed)
Ok to refill 

## 2015-08-01 DIAGNOSIS — Z23 Encounter for immunization: Secondary | ICD-10-CM | POA: Diagnosis not present

## 2015-08-07 ENCOUNTER — Telehealth: Payer: Self-pay | Admitting: Cardiology

## 2015-08-07 MED ORDER — LISINOPRIL 10 MG PO TABS: 10.0000 mg | ORAL_TABLET | Freq: Every day | ORAL | Status: DC

## 2015-08-07 NOTE — Telephone Encounter (Signed)
Patient's son calling needing refill for Lisinopril for his mom. Sent in refill for Lisinopril to patient's pharmacy. Patient's son verbalized understanding.

## 2015-08-07 NOTE — Telephone Encounter (Signed)
Pt c/o medication issue:  1. Name of Medication: lisinopril   2. How are you currently taking this medication (dosage and times per day)? 1x day  3. Are you having a reaction (difficulty breathing--STAT)? no  4. What is your medication issue? Pt needs a refill and he dont want to speak to refills he wants to speak to the RN

## 2015-08-08 ENCOUNTER — Ambulatory Visit: Payer: Medicare Other | Admitting: Cardiology

## 2015-08-10 ENCOUNTER — Ambulatory Visit (INDEPENDENT_AMBULATORY_CARE_PROVIDER_SITE_OTHER): Payer: Medicare Other

## 2015-08-10 DIAGNOSIS — I482 Chronic atrial fibrillation, unspecified: Secondary | ICD-10-CM

## 2015-08-10 DIAGNOSIS — Z5181 Encounter for therapeutic drug level monitoring: Secondary | ICD-10-CM

## 2015-08-10 DIAGNOSIS — I4891 Unspecified atrial fibrillation: Secondary | ICD-10-CM

## 2015-08-10 LAB — POCT INR: INR: 1.6

## 2015-08-13 DIAGNOSIS — R300 Dysuria: Secondary | ICD-10-CM | POA: Diagnosis not present

## 2015-08-13 DIAGNOSIS — N390 Urinary tract infection, site not specified: Secondary | ICD-10-CM | POA: Diagnosis not present

## 2015-08-30 ENCOUNTER — Other Ambulatory Visit: Payer: Self-pay | Admitting: Cardiology

## 2015-08-31 ENCOUNTER — Ambulatory Visit (INDEPENDENT_AMBULATORY_CARE_PROVIDER_SITE_OTHER): Payer: Medicare Other | Admitting: Pharmacist

## 2015-08-31 DIAGNOSIS — I482 Chronic atrial fibrillation, unspecified: Secondary | ICD-10-CM

## 2015-08-31 DIAGNOSIS — I4891 Unspecified atrial fibrillation: Secondary | ICD-10-CM | POA: Diagnosis not present

## 2015-08-31 DIAGNOSIS — Z5181 Encounter for therapeutic drug level monitoring: Secondary | ICD-10-CM

## 2015-08-31 LAB — POCT INR: INR: 2.1

## 2015-09-10 ENCOUNTER — Encounter: Payer: Self-pay | Admitting: Cardiology

## 2015-09-10 ENCOUNTER — Ambulatory Visit (INDEPENDENT_AMBULATORY_CARE_PROVIDER_SITE_OTHER): Payer: Medicare Other | Admitting: Cardiology

## 2015-09-10 VITALS — BP 116/68 | HR 76 | Ht 67.0 in | Wt 158.8 lb

## 2015-09-10 DIAGNOSIS — B351 Tinea unguium: Secondary | ICD-10-CM | POA: Diagnosis not present

## 2015-09-10 DIAGNOSIS — I482 Chronic atrial fibrillation, unspecified: Secondary | ICD-10-CM

## 2015-09-10 DIAGNOSIS — R0602 Shortness of breath: Secondary | ICD-10-CM

## 2015-09-10 DIAGNOSIS — I5032 Chronic diastolic (congestive) heart failure: Secondary | ICD-10-CM | POA: Diagnosis not present

## 2015-09-10 DIAGNOSIS — I4891 Unspecified atrial fibrillation: Secondary | ICD-10-CM | POA: Diagnosis not present

## 2015-09-10 DIAGNOSIS — I119 Hypertensive heart disease without heart failure: Secondary | ICD-10-CM

## 2015-09-10 DIAGNOSIS — I2510 Atherosclerotic heart disease of native coronary artery without angina pectoris: Secondary | ICD-10-CM

## 2015-09-10 LAB — HEPATIC FUNCTION PANEL
ALT: 24 U/L (ref 6–29)
AST: 29 U/L (ref 10–35)
Albumin: 3.9 g/dL (ref 3.6–5.1)
Alkaline Phosphatase: 107 U/L (ref 33–130)
BILIRUBIN DIRECT: 0.2 mg/dL (ref <=0.2)
BILIRUBIN INDIRECT: 0.7 mg/dL (ref 0.2–1.2)
BILIRUBIN TOTAL: 0.9 mg/dL (ref 0.2–1.2)
Total Protein: 7.5 g/dL (ref 6.1–8.1)

## 2015-09-10 LAB — BASIC METABOLIC PANEL
BUN: 25 mg/dL (ref 7–25)
CALCIUM: 9.3 mg/dL (ref 8.6–10.4)
CO2: 28 mmol/L (ref 20–31)
CREATININE: 1.31 mg/dL — AB (ref 0.60–0.93)
Chloride: 98 mmol/L (ref 98–110)
Glucose, Bld: 119 mg/dL — ABNORMAL HIGH (ref 65–99)
POTASSIUM: 5 mmol/L (ref 3.5–5.3)
SODIUM: 136 mmol/L (ref 135–146)

## 2015-09-10 LAB — LIPID PANEL
CHOL/HDL RATIO: 5.6 ratio — AB (ref <=5.0)
CHOLESTEROL: 189 mg/dL (ref 125–200)
HDL: 34 mg/dL — ABNORMAL LOW (ref >=46)
LDL Cholesterol: 113 mg/dL (ref <130)
Triglycerides: 209 mg/dL — ABNORMAL HIGH (ref <150)
VLDL: 42 mg/dL — AB (ref <30)

## 2015-09-10 LAB — CBC WITH DIFFERENTIAL/PLATELET
BASOS PCT: 0 % (ref 0–1)
Basophils Absolute: 0 10*3/uL (ref 0.0–0.1)
EOS ABS: 0.1 10*3/uL (ref 0.0–0.7)
Eosinophils Relative: 2 % (ref 0–5)
HCT: 42.4 % (ref 36.0–46.0)
Hemoglobin: 14.2 g/dL (ref 12.0–15.0)
Lymphocytes Relative: 23 % (ref 12–46)
Lymphs Abs: 1.2 10*3/uL (ref 0.7–4.0)
MCH: 31.7 pg (ref 26.0–34.0)
MCHC: 33.5 g/dL (ref 30.0–36.0)
MCV: 94.6 fL (ref 78.0–100.0)
MONOS PCT: 13 % — AB (ref 3–12)
MPV: 9.5 fL (ref 8.6–12.4)
Monocytes Absolute: 0.7 10*3/uL (ref 0.1–1.0)
NEUTROS PCT: 62 % (ref 43–77)
Neutro Abs: 3.3 10*3/uL (ref 1.7–7.7)
PLATELETS: 194 10*3/uL (ref 150–400)
RBC: 4.48 MIL/uL (ref 3.87–5.11)
RDW: 14.2 % (ref 11.5–15.5)
WBC: 5.3 10*3/uL (ref 4.0–10.5)

## 2015-09-10 MED ORDER — NITROGLYCERIN 0.4 MG SL SUBL: 0.4000 mg | SUBLINGUAL_TABLET | SUBLINGUAL | Status: DC | PRN

## 2015-09-10 NOTE — Progress Notes (Signed)
Cardiology Office Note   Date:  09/11/2015   ID:  Natasha Livingston, DOB Dec 29, 1933, MRN [id number]  PCP:  Suzan Garibaldi, FNP  Cardiologist: Darlin Coco MD  Chief Complaint  Patient presents with  . Aortic Stenosis      History of Present Illness: Natasha Livingston is a 81 y.o. female who presents for a four-month follow-up office visit.  This pleasant 81 year old woman is seen for a followup office visit. She has a history of chronic atrial fibrillation. History of high blood pressure. She has had remote CABG on 04/12/2007 by Dr. Darcey Nora. She has a past history of mitral regurgitation secondary to mitral valve prolapse. She also has a history of hypothyroidism and a history of essential hypertension.  The patient had an echocardiogram on 03/24/14 which showed an ejection fraction of 50% which was an improvement from the previous echo of 40%. It was felt that some of this improvement was secondary to improvement in tachycardia mediated cardiomyopathy. The patient had a Myoview stress test on 10/23/14 which showed no ischemia and her ejection fraction on that study was 54%. Her last echocardiogram on 03/24/14 showed an ejection fraction of 50% with mild to moderate mitral regurgitation and moderate tricuspid regurgitation. The patient has improved since we increased her furosemide to 60 mg twice a day. We are treating her for diastolic dysfunction. She remains in atrial fibrillation. She is on Coumadin. She has not had any TIA or stroke symptoms. She still complains about lack of energy. Her oxygen level by oximetry today was low although she is not particularly short of breath and her lungs are clear to auscultation.  We will get a chest x-ray.  Her last one was several years ago. The patient has onychomycosis of her left great toe.  She will discuss possible treatment options with her PCP.  We are checking liver function studies today. Past Medical History  Diagnosis Date    . Chest discomfort   . Dizziness   . MVP (mitral valve prolapse)   . DOE (dyspnea on exertion)   . Bruises easily   . Back pain   . Forgetfulness   . Atrial fibrillation ([place])   . Hypertension   . Hypothyroidism   . Aortic stenosis   . Hyperlipidemia   . Diverticulitis   . Interstitial nephritis   . Neuropathy ([place])   . Ischemic heart disease   . Hx of cardiovascular stress test     Lexiscan Myoview (12/15):  Apical lateral and anterolateral fixed defect, no ischemia, EF 54%; low risk    Past Surgical History  Procedure Laterality Date  . Coronary artery bypass graft  04/2007  . Cardioversion  08/2008  . Ovarian cyst removal       Current Outpatient Prescriptions  Medication Sig Dispense Refill  . ALPRAZolam (XANAX) 0.25 MG tablet Take by mouth 2 (two) times daily as needed for anxiety (Take 1/2 tablet by mouth twice daily).    . fexofenadine (ALLEGRA) 60 MG tablet Take 60 mg by mouth 2 (two) times daily as needed (allergies).     . furosemide (LASIX) 40 MG tablet TAKE 1.5 TABLET BY MOUTH 2 TIMES DAILY. 75 tablet 3  . gabapentin (NEURONTIN) 800 MG tablet Take 400 mg by mouth every morning. Take 1 tablet by mouth in the evenings    . hydrocortisone (ANUSOL-HC) 2.5 % rectal cream Place 1 application rectally 2 (two) times daily as needed for hemorrhoids.    Marland Kitchen KLOR-CON M20 20  MEQ tablet TAKE 2 TABLETS BY MOUTH TWICE A DAY 120 tablet 5  . levothyroxine (SYNTHROID, LEVOTHROID) 25 MCG tablet TAKE 1 TABLET BY MOUTH EVERY DAY 30 tablet 6  . lisinopril (PRINIVIL,ZESTRIL) 10 MG tablet Take 1 tablet (10 mg total) by mouth daily. 90 tablet 3  . metoprolol succinate (TOPROL-XL) 25 MG 24 hr tablet Take 1 tablet (25 mg total) by mouth 2 (two) times daily. 180 tablet 3  . nitroGLYCERIN (NITROSTAT) 0.4 MG SL tablet Place 1 tablet (0.4 mg total) under the tongue every 5 (five) minutes as needed for chest pain. 25 tablet PRN  . pantoprazole (PROTONIX) 40 MG tablet Take 40 mg by mouth daily as  needed (indigestion).    . potassium chloride SA (K-DUR,KLOR-CON) 20 MEQ tablet Take 40 mEq by mouth 2 (two) times daily.    Marland Kitchen warfarin (COUMADIN) 5 MG tablet Take as directed by coumadin clinic 90 tablet 1   No current facility-administered medications for this visit.    Allergies:   Amiodarone; Crestor; and Lipitor    Social History:  The patient  reports that she has never smoked. She does not have any smokeless tobacco history on file. She reports that she does not drink alcohol or use illicit drugs.   Family History:  The patient's family history includes Cerebral aneurysm in her father and another family member; Hypertension in her father. There is no history of Heart attack or Stroke.    ROS:  Please see the history of present illness.   Otherwise, review of systems are positive for none.   All other systems are reviewed and negative.    PHYSICAL EXAM: VS:  BP 116/68 mmHg  Pulse 76  Ht 5\' 7"  (1.702 m)  Wt 158 lb 12.8 oz (72.031 kg)  BMI 24.87 kg/m2  SpO2 85% , BMI Body mass index is 24.87 kg/(m^2). GEN: Well nourished, well developed, in no acute distress HEENT: normal Neck: no JVD, carotid bruits, or masses Cardiac: Irregularly irregular; no murmurs, rubs, or gallops,no edema  Respiratory:  clear to auscultation bilaterally, normal work of breathing GI: soft, nontender, nondistended, + BS MS: no deformity or atrophy Skin: warm and dry, no rash Neuro:  Strength and sensation are intact Psych: euthymic mood, full affect   EKG:  EKG is not ordered today.    Recent Labs: 10/10/2014: Pro B Natriuretic peptide (BNP) 217.0*; TSH 1.80 09/10/2015: ALT 24; BUN 25; Creat 1.31*; Hemoglobin 14.2; Platelets 194; Potassium 5.0; Sodium 136    Lipid Panel    Component Value Date/Time   CHOL 189 09/10/2015 1121   TRIG 209* 09/10/2015 1121   HDL 34* 09/10/2015 1121   CHOLHDL 5.6* 09/10/2015 1121   VLDL 42* 09/10/2015 1121   LDLCALC 113 09/10/2015 1121      Wt Readings  from Last 3 Encounters:  09/10/15 158 lb 12.8 oz (72.031 kg)  04/27/15 161 lb 12.8 oz (73.392 kg)  12/21/14 159 lb 9.6 oz (72.394 kg)        ASSESSMENT AND PLAN:  1. Chronic diastolic CHF (congestive heart failure):    Doing better on higher dose Lasix 60 mg twice a day.  She sleeps on one pillow. 3. Chronic atrial fibrillation: Rate controlled. Continue current dose of beta blocker. FU with the coumadin clinic as planned.  4. Coronary artery disease: Recent Myoview low risk. No further ischemic evaluation.  - Continue beta blocker.   - She is intolerant of statins.  5. Cardiomyopathy: EF normal on nuclear study .  Continue beta blocker, ACEI.  6. Essential hypertension: Controlled.  7. Hyperlipidemia: Intolerant to statins.  8. Epigastric Pain: Improved. She can take PPI prn. If symptoms worsen, consider referral to GI.   Current medicines are reviewed at length with the patient today.  The patient does not have concerns regarding medicines.  The following changes have been made:  no change  Labs/ tests ordered today include:   Orders Placed This Encounter  Procedures  . DG Chest 2 View  . Lipid panel  . Hepatic function panel  . Basic metabolic panel  . CBC with Differential/Platelet     Disposition: We'll get chest x-ray to evaluate hypoxemia.  She prefers to get the x-ray when she returns in several weeks for her next INR. Continue current medication.  We refilled her sublingual nitroglycerin even though she has not needed it lately. Return in 4 months for follow-up office visit  Signed, Darlin Coco MD 09/11/2015 11:56 AM    [facility], [place], [place]  [address] Phone: [phone]; Fax: [phone]

## 2015-09-10 NOTE — Patient Instructions (Signed)
Medication Instructions:  Your physician recommends that you continue on your current medications as directed. Please refer to the Current Medication list given to you today.  Labwork: Lp/bmet/hfp/cbc  Testing/Procedures: A chest x-ray takes a picture of the organs and structures inside the chest, including the heart, lungs, and blood vessels. This test can show several things, including, whether the heart is enlarges; whether fluid is building up in the lungs; and whether pacemaker / defibrillator leads are still in place. [facility] [place]  Follow-Up: Your physician recommends that you schedule a follow-up appointment in: [place] NP OR [facility] PA   If you need a refill on your cardiac medications before your next appointment, please call your pharmacy.

## 2015-09-11 NOTE — Progress Notes (Signed)
Quick Note:  Please report to patient. The recent labs are stable. Continue same medication and careful diet. ______ 

## 2015-09-21 ENCOUNTER — Ambulatory Visit
Admission: RE | Admit: 2015-09-21 | Discharge: 2015-09-21 | Disposition: A | Discharge status: Home / Self Care | Payer: Medicare Other | Source: Ambulatory Visit | Attending: Cardiology | Admitting: Cardiology

## 2015-09-21 ENCOUNTER — Ambulatory Visit (INDEPENDENT_AMBULATORY_CARE_PROVIDER_SITE_OTHER): Payer: Medicare Other | Admitting: *Deleted

## 2015-09-21 DIAGNOSIS — I482 Chronic atrial fibrillation, unspecified: Secondary | ICD-10-CM

## 2015-09-21 DIAGNOSIS — I5032 Chronic diastolic (congestive) heart failure: Secondary | ICD-10-CM

## 2015-09-21 DIAGNOSIS — Z5181 Encounter for therapeutic drug level monitoring: Secondary | ICD-10-CM | POA: Diagnosis not present

## 2015-09-21 DIAGNOSIS — R0602 Shortness of breath: Secondary | ICD-10-CM | POA: Diagnosis not present

## 2015-09-21 DIAGNOSIS — I4891 Unspecified atrial fibrillation: Secondary | ICD-10-CM | POA: Diagnosis not present

## 2015-09-21 LAB — POCT INR: INR: 1.8

## 2015-09-28 ENCOUNTER — Other Ambulatory Visit: Payer: Self-pay | Admitting: Cardiology

## 2015-10-12 ENCOUNTER — Ambulatory Visit (INDEPENDENT_AMBULATORY_CARE_PROVIDER_SITE_OTHER): Payer: Medicare Other | Admitting: *Deleted

## 2015-10-12 DIAGNOSIS — I4891 Unspecified atrial fibrillation: Secondary | ICD-10-CM | POA: Diagnosis not present

## 2015-10-12 DIAGNOSIS — I482 Chronic atrial fibrillation, unspecified: Secondary | ICD-10-CM

## 2015-10-12 DIAGNOSIS — Z5181 Encounter for therapeutic drug level monitoring: Secondary | ICD-10-CM | POA: Diagnosis not present

## 2015-10-12 LAB — POCT INR: INR: 1.6

## 2015-10-20 ENCOUNTER — Other Ambulatory Visit: Payer: Self-pay | Admitting: Cardiology

## 2015-10-23 ENCOUNTER — Telehealth: Payer: Self-pay | Admitting: Cardiology

## 2015-10-23 NOTE — Telephone Encounter (Signed)
LEFT MESSAGE  THAT  DR BRACKBILL IS  NOT  IN OFFICE  WILL FORWARD  MESSAGE FOR  HIS REVIEW   PT   NOTIFIED WILL GIVE  ANSWER  ONCE  RESPONSE   GIVEN .Adonis Housekeeper

## 2015-10-23 NOTE — Telephone Encounter (Signed)
Okay to call her in a Zithromax Z-Pak as directed

## 2015-10-23 NOTE — Telephone Encounter (Signed)
New Message  Pt called state that she has come down with a head cold and she states that she is draining a lot.. She states that in the past Dr. Mare Ferrari didn't allow her to get a cold and she is requesting to have Dr. Mare Ferrari to call her in a Zpack. Please call back to discuss.

## 2015-10-24 MED ORDER — AZITHROMYCIN 250 MG PO TABS: ORAL_TABLET | ORAL | Status: DC

## 2015-10-24 NOTE — Telephone Encounter (Signed)
Spoke with pt's son and told him Dr. Mare Ferrari has ordered Zithromax Z-Pak for pt. Son will let pt know. He reports pt has taken this in past.  [facility] [place]

## 2015-10-26 ENCOUNTER — Ambulatory Visit (INDEPENDENT_AMBULATORY_CARE_PROVIDER_SITE_OTHER): Payer: Medicare Other

## 2015-10-26 DIAGNOSIS — I4891 Unspecified atrial fibrillation: Secondary | ICD-10-CM | POA: Diagnosis not present

## 2015-10-26 DIAGNOSIS — I482 Chronic atrial fibrillation, unspecified: Secondary | ICD-10-CM

## 2015-10-26 DIAGNOSIS — Z5181 Encounter for therapeutic drug level monitoring: Secondary | ICD-10-CM

## 2015-10-26 LAB — POCT INR: INR: 1.8

## 2015-10-31 ENCOUNTER — Encounter: Payer: Self-pay | Admitting: *Deleted

## 2015-11-06 DIAGNOSIS — J9801 Acute bronchospasm: Secondary | ICD-10-CM | POA: Diagnosis not present

## 2015-11-06 DIAGNOSIS — J209 Acute bronchitis, unspecified: Secondary | ICD-10-CM | POA: Diagnosis not present

## 2015-11-09 ENCOUNTER — Ambulatory Visit (INDEPENDENT_AMBULATORY_CARE_PROVIDER_SITE_OTHER): Payer: Medicare Other | Admitting: *Deleted

## 2015-11-09 DIAGNOSIS — I4891 Unspecified atrial fibrillation: Secondary | ICD-10-CM | POA: Diagnosis not present

## 2015-11-09 DIAGNOSIS — I482 Chronic atrial fibrillation, unspecified: Secondary | ICD-10-CM

## 2015-11-09 DIAGNOSIS — Z5181 Encounter for therapeutic drug level monitoring: Secondary | ICD-10-CM

## 2015-11-09 LAB — POCT INR: INR: 2.6

## 2015-11-14 ENCOUNTER — Ambulatory Visit (INDEPENDENT_AMBULATORY_CARE_PROVIDER_SITE_OTHER): Payer: Medicare Other | Admitting: *Deleted

## 2015-11-14 DIAGNOSIS — I4891 Unspecified atrial fibrillation: Secondary | ICD-10-CM

## 2015-11-14 DIAGNOSIS — Z5181 Encounter for therapeutic drug level monitoring: Secondary | ICD-10-CM

## 2015-11-14 DIAGNOSIS — I482 Chronic atrial fibrillation, unspecified: Secondary | ICD-10-CM

## 2015-11-14 LAB — POCT INR: INR: 4.4

## 2015-11-19 ENCOUNTER — Telehealth: Payer: Self-pay | Admitting: Cardiology

## 2015-11-19 NOTE — Telephone Encounter (Signed)
Spoke with pt's son Gerald Stabs and informed him we will recheck her INR today if he wants her checked but he states  they have an appt to see Dr Rock Nephew in [place] today  and they are going to ask him to check her INR on this visit .Instructed her being sluggish and sleepy does not have anything to do with her being on coumadin Did finish her Prednisone on last Wednesday when she had INR checked in our office Gerald Stabs instructed that if the Doctor does check her INR to have then fax results to our office and he states understanding

## 2015-11-19 NOTE — Telephone Encounter (Signed)
New message     At last visit pt was on prednisone.  She stopped the prednisone a few days ago.  She is very "sluggish/sleepy" all of the time.  Should she come in sooner for a PT/INR check since her INR was high the last time?

## 2015-11-20 ENCOUNTER — Ambulatory Visit (INDEPENDENT_AMBULATORY_CARE_PROVIDER_SITE_OTHER): Payer: Medicare Other | Admitting: Pharmacist

## 2015-11-20 DIAGNOSIS — Z5181 Encounter for therapeutic drug level monitoring: Secondary | ICD-10-CM | POA: Diagnosis not present

## 2015-11-20 DIAGNOSIS — I4891 Unspecified atrial fibrillation: Secondary | ICD-10-CM

## 2015-11-20 DIAGNOSIS — I482 Chronic atrial fibrillation, unspecified: Secondary | ICD-10-CM

## 2015-11-20 LAB — POCT INR: INR: 2

## 2015-11-28 ENCOUNTER — Encounter: Payer: Self-pay | Admitting: Nurse Practitioner

## 2015-12-11 ENCOUNTER — Ambulatory Visit (INDEPENDENT_AMBULATORY_CARE_PROVIDER_SITE_OTHER): Payer: Medicare Other

## 2015-12-11 DIAGNOSIS — I482 Chronic atrial fibrillation, unspecified: Secondary | ICD-10-CM

## 2015-12-11 DIAGNOSIS — I4891 Unspecified atrial fibrillation: Secondary | ICD-10-CM | POA: Diagnosis not present

## 2015-12-11 DIAGNOSIS — Z5181 Encounter for therapeutic drug level monitoring: Secondary | ICD-10-CM

## 2015-12-11 LAB — POCT INR: INR: 4.2

## 2015-12-16 ENCOUNTER — Other Ambulatory Visit: Payer: Self-pay | Admitting: Cardiology

## 2015-12-16 DIAGNOSIS — F419 Anxiety disorder, unspecified: Secondary | ICD-10-CM

## 2015-12-23 ENCOUNTER — Other Ambulatory Visit: Payer: Self-pay | Admitting: Cardiology

## 2015-12-24 ENCOUNTER — Telehealth: Payer: Self-pay | Admitting: Cardiology

## 2015-12-24 NOTE — Telephone Encounter (Signed)
Spoke with pharmacy and 90 day Rx called in but insurance will only cover a 39 day  Advised son

## 2015-12-24 NOTE — Telephone Encounter (Signed)
ALPRAZolam (XANAX) 0.25 MG tablet Questioning that the pharmacy gave a 30 day not 90 day. Would like a call

## 2015-12-25 ENCOUNTER — Ambulatory Visit (INDEPENDENT_AMBULATORY_CARE_PROVIDER_SITE_OTHER): Payer: Medicare Other | Admitting: *Deleted

## 2015-12-25 DIAGNOSIS — I4891 Unspecified atrial fibrillation: Secondary | ICD-10-CM

## 2015-12-25 DIAGNOSIS — I482 Chronic atrial fibrillation, unspecified: Secondary | ICD-10-CM

## 2015-12-25 DIAGNOSIS — Z5181 Encounter for therapeutic drug level monitoring: Secondary | ICD-10-CM

## 2015-12-25 LAB — POCT INR: INR: 3

## 2016-01-05 IMAGING — CR DG CHEST 2V
2 series · 2 of 2 positions shown · non-contrast
Comparison: 01/20/2013

CLINICAL DATA: Shortness of Breath, chronic diastolic CHF

EXAM:
CHEST - 2 VIEW

[view not recorded (1 of 2)]
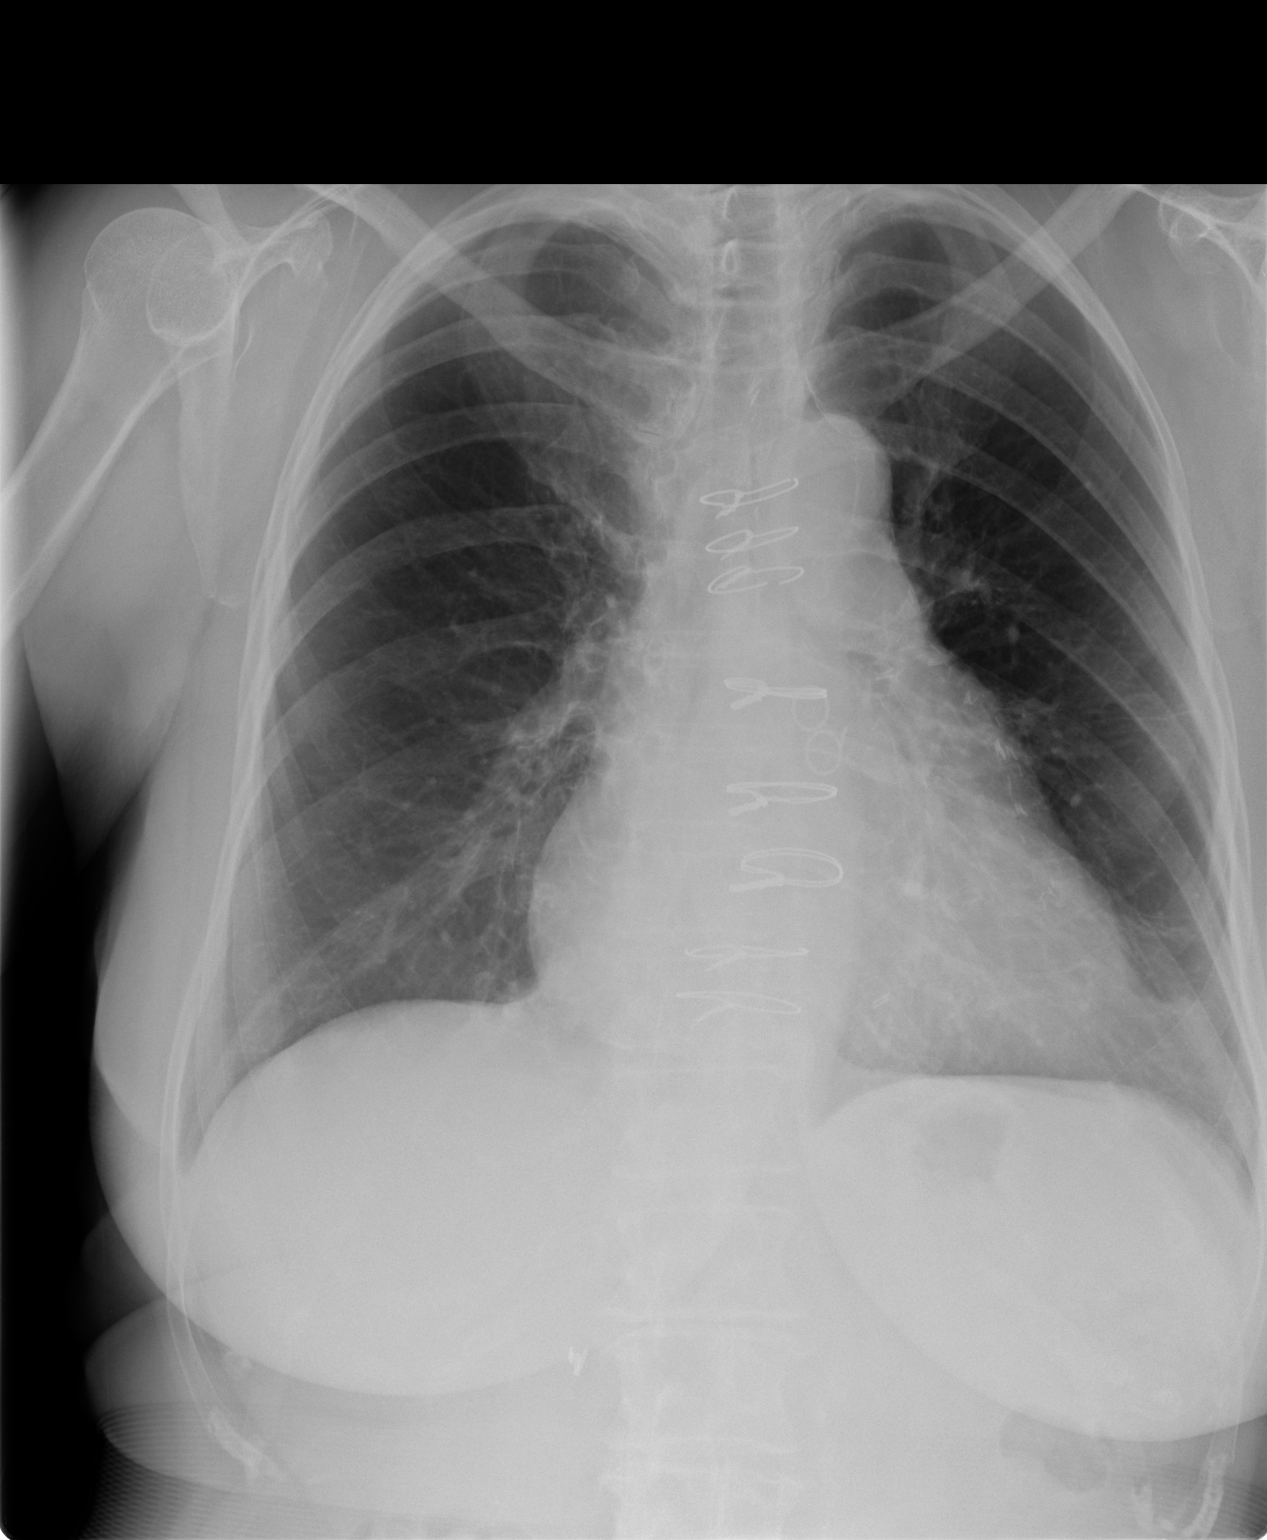

[view not recorded (2 of 2)]
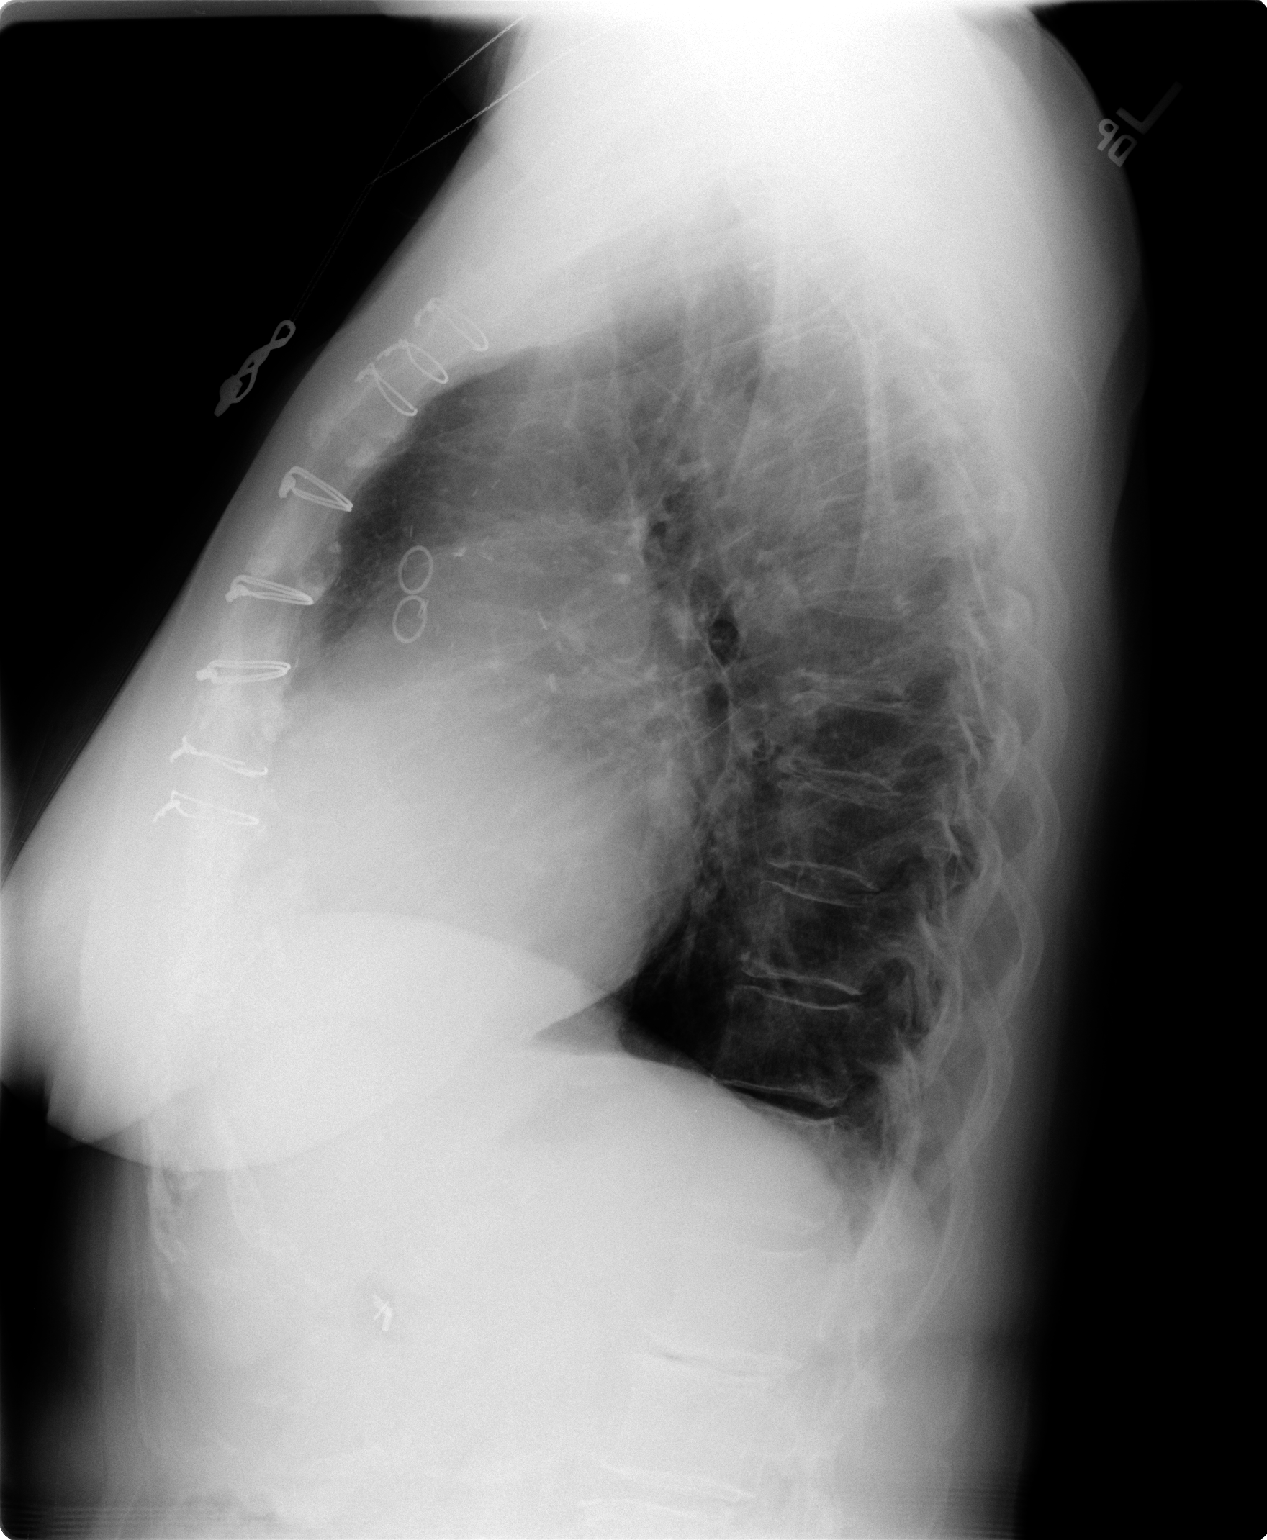

[2 of 2 positions shown; findings below may reference images not displayed]

FINDINGS: Postsurgical changes are again noted. Cardiac shadow is enlarged.
The lungs are well aerated bilaterally. No focal infiltrate or
sizable effusion is seen. No bony abnormality is noted.
IMPRESSION: No active disease.

## 2016-01-15 ENCOUNTER — Ambulatory Visit (INDEPENDENT_AMBULATORY_CARE_PROVIDER_SITE_OTHER): Payer: Medicare Other | Admitting: Pharmacist

## 2016-01-15 DIAGNOSIS — Z5181 Encounter for therapeutic drug level monitoring: Secondary | ICD-10-CM

## 2016-01-15 DIAGNOSIS — I482 Chronic atrial fibrillation, unspecified: Secondary | ICD-10-CM

## 2016-01-15 DIAGNOSIS — I4891 Unspecified atrial fibrillation: Secondary | ICD-10-CM

## 2016-01-15 LAB — POCT INR: INR: 2.2

## 2016-01-18 ENCOUNTER — Ambulatory Visit: Payer: Medicare Other | Admitting: Nurse Practitioner

## 2016-02-01 ENCOUNTER — Encounter: Payer: Self-pay | Admitting: Nurse Practitioner

## 2016-02-01 ENCOUNTER — Ambulatory Visit (INDEPENDENT_AMBULATORY_CARE_PROVIDER_SITE_OTHER): Payer: Medicare Other | Admitting: Nurse Practitioner

## 2016-02-01 VITALS — BP 116/82 | HR 76 | Ht 67.0 in | Wt 154.1 lb

## 2016-02-01 DIAGNOSIS — I428 Other cardiomyopathies: Secondary | ICD-10-CM | POA: Diagnosis not present

## 2016-02-01 DIAGNOSIS — I429 Cardiomyopathy, unspecified: Secondary | ICD-10-CM

## 2016-02-01 DIAGNOSIS — I509 Heart failure, unspecified: Secondary | ICD-10-CM | POA: Diagnosis not present

## 2016-02-01 DIAGNOSIS — I5032 Chronic diastolic (congestive) heart failure: Secondary | ICD-10-CM

## 2016-02-01 DIAGNOSIS — I259 Chronic ischemic heart disease, unspecified: Secondary | ICD-10-CM | POA: Diagnosis not present

## 2016-02-01 LAB — CBC
HCT: 44 % (ref 36.0–46.0)
Hemoglobin: 15.4 g/dL — ABNORMAL HIGH (ref 12.0–15.0)
MCH: 33 pg (ref 26.0–34.0)
MCHC: 35 g/dL (ref 30.0–36.0)
MCV: 94.2 fL (ref 78.0–100.0)
MPV: 9.9 fL (ref 8.6–12.4)
Platelets: 161 10*3/uL (ref 150–400)
RBC: 4.67 MIL/uL (ref 3.87–5.11)
RDW: 14.6 % (ref 11.5–15.5)
WBC: 4.4 10*3/uL (ref 4.0–10.5)

## 2016-02-01 LAB — HEPATIC FUNCTION PANEL
ALT: 16 U/L (ref 6–29)
AST: 29 U/L (ref 10–35)
Albumin: 4.2 g/dL (ref 3.6–5.1)
Alkaline Phosphatase: 126 U/L (ref 33–130)
Bilirubin, Direct: 0.2 mg/dL (ref <=0.2)
Indirect Bilirubin: 0.5 mg/dL (ref 0.2–1.2)
Total Bilirubin: 0.7 mg/dL (ref 0.2–1.2)
Total Protein: 7.1 g/dL (ref 6.1–8.1)

## 2016-02-01 LAB — BASIC METABOLIC PANEL
BUN: 27 mg/dL — ABNORMAL HIGH (ref 7–25)
CO2: 27 mmol/L (ref 20–31)
Calcium: 8.9 mg/dL (ref 8.6–10.4)
Chloride: 100 mmol/L (ref 98–110)
Creat: 1.41 mg/dL — ABNORMAL HIGH (ref 0.60–0.93)
Glucose, Bld: 80 mg/dL (ref 65–99)
Potassium: 5.1 mmol/L (ref 3.5–5.3)
Sodium: 138 mmol/L (ref 135–146)

## 2016-02-01 LAB — LIPID PANEL
Cholesterol: 160 mg/dL (ref 125–200)
HDL: 29 mg/dL — ABNORMAL LOW (ref >=46)
LDL Cholesterol: 94 mg/dL (ref <130)
Total CHOL/HDL Ratio: 5.5 Ratio — ABNORMAL HIGH (ref <=5.0)
Triglycerides: 185 mg/dL — ABNORMAL HIGH (ref <150)
VLDL: 37 mg/dL — ABNORMAL HIGH (ref <30)

## 2016-02-01 NOTE — Progress Notes (Signed)
CARDIOLOGY OFFICE NOTE  Date:  02/01/2016    Natasha Livingston Date of Birth: October 24, 1934 Medical Record [id number]  PCP:  Leonides Sake, MD  Cardiologist:  Former patient of Dr. Sherryl Barters.     Chief Complaint  Patient presents with  . Congestive Heart Failure  . Coronary Artery Disease  . Hypertension  . Hyperlipidemia    Follow up visit - former patient of Dr. Sherryl Barters    History of Present Illness: Natasha Livingston is a 82 y.o. female who presents today for a follow up visit. Former patient of Dr. Sherryl Barters.   She has a history of chronic atrial fibrillation, HTN, CAD with remote CABG in 2008 by Dr. Darcey Nora. Other issues include diastolic HF, chronic fatigue, MR due to MVP, hypothyroidism, and HTN.  Echocardiogram on 03/24/14 showed an ejection fraction of 50% which was an improvement from the previous echo of 40%. It was felt that some of this improvement was secondary to improvement in tachycardia mediated cardiomyopathy. She had a negative Myoview stress test on 10/23/14 showing no ischemia and her ejection fraction on that study was 54%.   Last seen back in November and felt to be stable. She has chronic complaints of weakness.  Comes in today. Here with her son and grandson. Chronic complaints of weakness and fatigue. Has had some recent cough and congestion along with diarrhea. Pretty sedentary. No chest pain. Breathing seems to be ok. She has lost a few pounds - trying to eat less. No falls. No swelling.   Past Medical History  Diagnosis Date  . Chest discomfort   . Dizziness   . MVP (mitral valve prolapse)   . DOE (dyspnea on exertion)   . Bruises easily   . Back pain   . Forgetfulness   . Atrial fibrillation ([place])   . Hypertension   . Hypothyroidism   . Aortic stenosis   . Hyperlipidemia   . Diverticulitis   . Interstitial nephritis   . Neuropathy ([place])   . Ischemic heart disease   . Hx of cardiovascular stress test     Lexiscan Myoview  (12/15):  Apical lateral and anterolateral fixed defect, no ischemia, EF 54%; low risk    Past Surgical History  Procedure Laterality Date  . Coronary artery bypass graft  04/2007  . Cardioversion  08/2008  . Ovarian cyst removal       Medications: Current Outpatient Prescriptions  Medication Sig Dispense Refill  . ALPRAZolam (XANAX) 0.25 MG tablet Take 1/2 tablet twice daily as needed for anxiety 90 tablet 1  . fexofenadine (ALLEGRA) 60 MG tablet Take 60 mg by mouth 2 (two) times daily as needed (allergies).     . furosemide (LASIX) 40 MG tablet TAKE 1.5 TABLET BY MOUTH 2 TIMES DAILY. PLEASE CALL AND MAKE APPOINTMENT 90 tablet 3  . gabapentin (NEURONTIN) 800 MG tablet Take 400 mg by mouth every morning. Take 1 tablet by mouth in the evenings    . hydrocortisone (ANUSOL-HC) 2.5 % rectal cream Place 1 application rectally 2 (two) times daily as needed for hemorrhoids.    Marland Kitchen KLOR-CON M20 20 MEQ tablet TAKE 2 TABLETS BY MOUTH TWICE A DAY 120 tablet 5  . levothyroxine (SYNTHROID, LEVOTHROID) 25 MCG tablet TAKE 1 TABLET BY MOUTH EVERY DAY 30 tablet 6  . lisinopril (PRINIVIL,ZESTRIL) 10 MG tablet Take 1 tablet (10 mg total) by mouth daily. 90 tablet 3  . metoprolol succinate (TOPROL-XL) 25 MG 24 hr tablet Take 1 tablet (  25 mg total) by mouth 2 (two) times daily. 180 tablet 3  . nitroGLYCERIN (NITROSTAT) 0.4 MG SL tablet Place 1 tablet (0.4 mg total) under the tongue every 5 (five) minutes as needed for chest pain. 25 tablet PRN  . pantoprazole (PROTONIX) 40 MG tablet Take 40 mg by mouth daily as needed (indigestion).    . warfarin (COUMADIN) 5 MG tablet TAKE AS DIRECTED BY COUMADIN CLINIC 90 tablet 0   No current facility-administered medications for this visit.    Allergies: Allergies  Allergen Reactions  . Amiodarone Other (See Comments)    unknown  . Crestor [Rosuvastatin Calcium] Other (See Comments)    unknown  . Lipitor [Atorvastatin Calcium] Swelling    Social History: The  patient  reports that she has never smoked. She does not have any smokeless tobacco history on file. She reports that she does not drink alcohol or use illicit drugs.   Family History: The patient's family history includes Cerebral aneurysm in her father; Hypertension in her father. There is no history of Heart attack or Stroke.   Review of Systems: Please see the history of present illness.   Otherwise, the review of systems is positive for none.   All other systems are reviewed and negative.   Physical Exam: VS:  BP 116/82 mmHg  Pulse 76  Ht 5\' 7"  (1.702 m)  Wt 154 lb 1.9 oz (69.908 kg)  BMI 24.13 kg/m2 .  BMI Body mass index is 24.13 kg/(m^2).  Wt Readings from Last 3 Encounters:  02/01/16 154 lb 1.9 oz (69.908 kg)  09/10/15 158 lb 12.8 oz (72.031 kg)  04/27/15 161 lb 12.8 oz (73.392 kg)    General: Pleasant. Elderly female who is alert and in no acute distress.  HEENT: Normal. Neck: Supple, no JVD, carotid bruits, or masses noted.  Cardiac: Irregular irregular rhythm. Rate ok. No edema.  Respiratory:  Lungs are clear to auscultation bilaterally with normal work of breathing.  GI: Soft and nontender.  MS: No deformity or atrophy. Gait and ROM intact.Using a cane.  Skin: Warm and dry. Color is normal.  Neuro:  Strength and sensation are intact and no gross focal deficits noted.  Psych: Alert, appropriate and with normal affect.   LABORATORY DATA:  EKG:  EKG is not ordered today.  Lab Results  Component Value Date   WBC 5.3 09/10/2015   HGB 14.2 09/10/2015   HCT 42.4 09/10/2015   PLT 194 09/10/2015   GLUCOSE 119* 09/10/2015   CHOL 189 09/10/2015   TRIG 209* 09/10/2015   HDL 34* 09/10/2015   LDLCALC 113 09/10/2015   ALT 24 09/10/2015   AST 29 09/10/2015   NA 136 09/10/2015   K 5.0 09/10/2015   CL 98 09/10/2015   CREATININE 1.31* 09/10/2015   BUN 25 09/10/2015   CO2 28 09/10/2015   TSH 1.80 10/10/2014   INR 2.2 01/15/2016   HGBA1C * 04/08/2007    6.9 (NOTE)    The ADA recommends the following therapeutic goals for glycemic   control related to Hgb A1C measurement:   Goal of Therapy:   < 7.0% Hgb A1C   Action Suggested:  > 8.0% Hgb A1C   Ref:  Diabetes Care, 22, Suppl. 1, 1999   Lab Results  Component Value Date   INR 2.2 01/15/2016   INR 3.0 12/25/2015   INR 4.2 12/11/2015    BNP (last 3 results) No results for input(s): BNP in the last 8760 hours.  ProBNP (last  3 results) No results for input(s): PROBNP in the last 8760 hours.   Other Studies Reviewed Today:  Myoview Overall Impression from 10/2014: Low risk stress nuclear study. There is a small fixed defect of moderate intensity involving apical lateral and mid anterolateral myocardium. There is no significant reversibility. SDS 1  LV Ejection Fraction: 54%. LV Wall Motion: NL LV Function; NL Wall Motion   Darlin Coco MD  Assessment/Plan: 1. Chronic diastolic CHF - she looks compensated.   2. Chronic atrial fibrillation: managed with rate control and anticoagulation  3. CAD with remote CABG - no chest pain reported. Would favor conservative management.   4. HLD - intolerant of statins  5. Cardiomyopathy: EF normal on last nuclear study . Continue beta blocker, ACEI.   6. Essential hypertension: Controlled.   Current medicines are reviewed with the patient today.  The patient does not have concerns regarding medicines other than what has been noted above.  The following changes have been made:  See above.  Labs/ tests ordered today include:    Orders Placed This Encounter  Procedures  . Basic metabolic panel  . CBC  . Hepatic function panel  . Lipid panel     Disposition:   FU with Dr. Marlou Porch in 4 months.   Patient is agreeable to this plan and will call if any problems develop in the interim.   Signed: Burtis Junes, RN, ANP-C 02/01/2016 12:17 PM  [facility] [address] [place], [place]   [address] Phone: [phone] Fax: [phone]

## 2016-02-01 NOTE — Patient Instructions (Addendum)
We will be checking the following labs today - BMET, CBC, HPF and lipids   Medication Instructions:    Continue with your current medicines.     Testing/Procedures To Be Arranged:  N/A  Follow-Up:   See Dr. Marlou Porch in 4 months    Other Special Instructions:   N/A    If you need a refill on your cardiac medications before your next appointment, please call your pharmacy.   Call the [place] office at [phone] if you have any questions, problems or concerns.

## 2016-02-04 ENCOUNTER — Other Ambulatory Visit: Payer: Self-pay | Admitting: *Deleted

## 2016-02-04 DIAGNOSIS — E875 Hyperkalemia: Secondary | ICD-10-CM

## 2016-02-04 MED ORDER — POTASSIUM CHLORIDE CRYS ER 20 MEQ PO TBCR: 20.0000 meq | EXTENDED_RELEASE_TABLET | Freq: Every day | ORAL | Status: DC

## 2016-02-05 ENCOUNTER — Telehealth: Payer: Self-pay | Admitting: Nurse Practitioner

## 2016-02-05 ENCOUNTER — Other Ambulatory Visit: Payer: Self-pay | Admitting: *Deleted

## 2016-02-05 MED ORDER — POTASSIUM CHLORIDE CRYS ER 20 MEQ PO TBCR: 40.0000 meq | EXTENDED_RELEASE_TABLET | Freq: Every day | ORAL | Status: DC

## 2016-02-05 NOTE — Telephone Encounter (Signed)
Follow Up  Pt son called back. He missed the message. Please call back

## 2016-02-05 NOTE — Telephone Encounter (Signed)
New Message: Pt's son called in stating that Cecille Rubin called her yesterday adjusting her potassium medication. He would like to speak with her to clarify her changes. Please f/u with him

## 2016-02-05 NOTE — Telephone Encounter (Signed)
lvm

## 2016-02-05 NOTE — Telephone Encounter (Signed)
I s/w son per DPR, pt is suppose to be taking (40 Meq) daily not (20 meq) daily my error read lab note wrong.  Went over directions again with son, decrease potassium to ( 40 meq ) daily, increase fluid intake and repeat bmet when pt come's back for INR check.  Went over date and time of appointment.

## 2016-02-12 ENCOUNTER — Ambulatory Visit (INDEPENDENT_AMBULATORY_CARE_PROVIDER_SITE_OTHER): Payer: Medicare Other | Admitting: Surgery

## 2016-02-12 ENCOUNTER — Other Ambulatory Visit (INDEPENDENT_AMBULATORY_CARE_PROVIDER_SITE_OTHER): Payer: Medicare Other

## 2016-02-12 DIAGNOSIS — I482 Chronic atrial fibrillation, unspecified: Secondary | ICD-10-CM

## 2016-02-12 DIAGNOSIS — E875 Hyperkalemia: Secondary | ICD-10-CM | POA: Diagnosis not present

## 2016-02-12 DIAGNOSIS — I4891 Unspecified atrial fibrillation: Secondary | ICD-10-CM

## 2016-02-12 DIAGNOSIS — Z5181 Encounter for therapeutic drug level monitoring: Secondary | ICD-10-CM

## 2016-02-12 LAB — BASIC METABOLIC PANEL
BUN: 16 mg/dL (ref 7–25)
CO2: 25 mmol/L (ref 20–31)
Calcium: 9.8 mg/dL (ref 8.6–10.4)
Chloride: 99 mmol/L (ref 98–110)
Creat: 0.92 mg/dL (ref 0.60–0.93)
Glucose, Bld: 98 mg/dL (ref 65–99)
Potassium: 3.9 mmol/L (ref 3.5–5.3)
Sodium: 137 mmol/L (ref 135–146)

## 2016-02-12 LAB — POCT INR: INR: 4

## 2016-02-18 DIAGNOSIS — M1990 Unspecified osteoarthritis, unspecified site: Secondary | ICD-10-CM | POA: Diagnosis not present

## 2016-02-18 DIAGNOSIS — M79671 Pain in right foot: Secondary | ICD-10-CM | POA: Diagnosis not present

## 2016-02-20 DIAGNOSIS — Z6824 Body mass index (BMI) 24.0-24.9, adult: Secondary | ICD-10-CM | POA: Diagnosis not present

## 2016-02-20 DIAGNOSIS — M109 Gout, unspecified: Secondary | ICD-10-CM | POA: Diagnosis not present

## 2016-02-20 DIAGNOSIS — Z1389 Encounter for screening for other disorder: Secondary | ICD-10-CM | POA: Diagnosis not present

## 2016-02-22 ENCOUNTER — Other Ambulatory Visit: Payer: Self-pay

## 2016-02-22 ENCOUNTER — Ambulatory Visit (INDEPENDENT_AMBULATORY_CARE_PROVIDER_SITE_OTHER): Payer: Medicare Other | Admitting: *Deleted

## 2016-02-22 DIAGNOSIS — I482 Chronic atrial fibrillation, unspecified: Secondary | ICD-10-CM

## 2016-02-22 DIAGNOSIS — I4891 Unspecified atrial fibrillation: Secondary | ICD-10-CM

## 2016-02-22 DIAGNOSIS — Z5181 Encounter for therapeutic drug level monitoring: Secondary | ICD-10-CM | POA: Diagnosis not present

## 2016-02-22 LAB — POCT INR: INR: 3.2

## 2016-02-22 MED ORDER — FUROSEMIDE 40 MG PO TABS: ORAL_TABLET | ORAL | Status: DC

## 2016-02-25 ENCOUNTER — Other Ambulatory Visit: Payer: Self-pay

## 2016-02-25 MED ORDER — POTASSIUM CHLORIDE CRYS ER 20 MEQ PO TBCR: 40.0000 meq | EXTENDED_RELEASE_TABLET | Freq: Every day | ORAL | Status: DC

## 2016-03-04 ENCOUNTER — Ambulatory Visit (INDEPENDENT_AMBULATORY_CARE_PROVIDER_SITE_OTHER): Payer: Medicare Other | Admitting: *Deleted

## 2016-03-04 ENCOUNTER — Other Ambulatory Visit: Payer: Self-pay

## 2016-03-04 DIAGNOSIS — I482 Chronic atrial fibrillation, unspecified: Secondary | ICD-10-CM

## 2016-03-04 DIAGNOSIS — I4891 Unspecified atrial fibrillation: Secondary | ICD-10-CM | POA: Diagnosis not present

## 2016-03-04 DIAGNOSIS — Z5181 Encounter for therapeutic drug level monitoring: Secondary | ICD-10-CM

## 2016-03-04 LAB — POCT INR: INR: 2.9

## 2016-03-05 ENCOUNTER — Telehealth: Payer: Self-pay | Admitting: Cardiology

## 2016-03-05 ENCOUNTER — Other Ambulatory Visit: Payer: Self-pay | Admitting: *Deleted

## 2016-03-05 MED ORDER — GABAPENTIN 800 MG PO TABS: 400.0000 mg | ORAL_TABLET | ORAL | Status: DC

## 2016-03-05 MED ORDER — LEVOTHYROXINE SODIUM 25 MCG PO TABS: 25.0000 ug | ORAL_TABLET | Freq: Every day | ORAL | Status: DC

## 2016-03-05 NOTE — Telephone Encounter (Signed)
Spoke with patient and she stated that she did not call, but she believes that her son Natasha Livingston did as he is her POA. She confirmed his phone number with me and asked me to please call him. Spoke with son and he was very upset and after discussing this with him for almost ten minutes, he proceeded to hang up on me. I made him aware that Dr Marlou Porch does not refill the requested medications and that they would need to come from patients pcp. Natasha Livingston stated that she will not be able to see her pcp for at least three weeks and asked that these be at least refilled for one month. He wanted me to forward a message to Truitt Merle to see if she would authorize the refills as he stated that the office cannot just leave his mother hanging with no medication.

## 2016-03-05 NOTE — Telephone Encounter (Signed)
°*  STAT* If patient is at the pharmacy, call can be transferred to refill team.   1. Which medications need to be refilled? (please list name of each medication and dose if known)Levothyroxine , Gabetentine  2. Which pharmacy/location (including street and city if local pharmacy) [facility] [place]   3. Do they need a 30 day or 90 day supply? 90  The pharmacy only give her 3 pills.

## 2016-03-05 NOTE — Telephone Encounter (Signed)
S/w pt's sons per DPR and Truitt Merle, NP, stated give pt a 30 day supply on gabapentin and synthroid until pt gets appointment with PCP.  Refilled today

## 2016-03-05 NOTE — Telephone Encounter (Signed)
Please advise on refill requests. Thanks, MI

## 2016-03-05 NOTE — Telephone Encounter (Signed)
These medications should come from pt's PCP.  If you can notify the pt to request them from PCP that would be great!

## 2016-03-12 DIAGNOSIS — G629 Polyneuropathy, unspecified: Secondary | ICD-10-CM | POA: Diagnosis not present

## 2016-03-12 DIAGNOSIS — I2510 Atherosclerotic heart disease of native coronary artery without angina pectoris: Secondary | ICD-10-CM | POA: Diagnosis not present

## 2016-03-12 DIAGNOSIS — Z79899 Other long term (current) drug therapy: Secondary | ICD-10-CM | POA: Diagnosis not present

## 2016-03-12 DIAGNOSIS — M109 Gout, unspecified: Secondary | ICD-10-CM | POA: Diagnosis not present

## 2016-03-12 DIAGNOSIS — Z6824 Body mass index (BMI) 24.0-24.9, adult: Secondary | ICD-10-CM | POA: Diagnosis not present

## 2016-03-12 DIAGNOSIS — F419 Anxiety disorder, unspecified: Secondary | ICD-10-CM | POA: Diagnosis not present

## 2016-03-12 DIAGNOSIS — E039 Hypothyroidism, unspecified: Secondary | ICD-10-CM | POA: Diagnosis not present

## 2016-03-26 ENCOUNTER — Ambulatory Visit (INDEPENDENT_AMBULATORY_CARE_PROVIDER_SITE_OTHER): Payer: Medicare Other | Admitting: Surgery

## 2016-03-26 DIAGNOSIS — I482 Chronic atrial fibrillation, unspecified: Secondary | ICD-10-CM

## 2016-03-26 DIAGNOSIS — I4891 Unspecified atrial fibrillation: Secondary | ICD-10-CM | POA: Diagnosis not present

## 2016-03-26 DIAGNOSIS — Z5181 Encounter for therapeutic drug level monitoring: Secondary | ICD-10-CM | POA: Diagnosis not present

## 2016-03-26 LAB — POCT INR: INR: 2.1

## 2016-04-03 ENCOUNTER — Other Ambulatory Visit: Payer: Self-pay | Admitting: Nurse Practitioner

## 2016-04-23 ENCOUNTER — Ambulatory Visit (INDEPENDENT_AMBULATORY_CARE_PROVIDER_SITE_OTHER): Payer: Medicare Other | Admitting: *Deleted

## 2016-04-23 DIAGNOSIS — I482 Chronic atrial fibrillation, unspecified: Secondary | ICD-10-CM

## 2016-04-23 DIAGNOSIS — I4891 Unspecified atrial fibrillation: Secondary | ICD-10-CM

## 2016-04-23 DIAGNOSIS — Z5181 Encounter for therapeutic drug level monitoring: Secondary | ICD-10-CM

## 2016-04-23 LAB — POCT INR: INR: 2.7

## 2016-05-16 ENCOUNTER — Encounter: Payer: Self-pay | Admitting: Cardiology

## 2016-05-16 ENCOUNTER — Ambulatory Visit (INDEPENDENT_AMBULATORY_CARE_PROVIDER_SITE_OTHER): Payer: Medicare Other | Admitting: Cardiology

## 2016-05-16 ENCOUNTER — Ambulatory Visit (INDEPENDENT_AMBULATORY_CARE_PROVIDER_SITE_OTHER): Payer: Medicare Other | Admitting: *Deleted

## 2016-05-16 VITALS — BP 124/84 | HR 73 | Ht 68.0 in | Wt 154.0 lb

## 2016-05-16 DIAGNOSIS — I10 Essential (primary) hypertension: Secondary | ICD-10-CM

## 2016-05-16 DIAGNOSIS — I4891 Unspecified atrial fibrillation: Secondary | ICD-10-CM

## 2016-05-16 DIAGNOSIS — I119 Hypertensive heart disease without heart failure: Secondary | ICD-10-CM

## 2016-05-16 DIAGNOSIS — I5032 Chronic diastolic (congestive) heart failure: Secondary | ICD-10-CM

## 2016-05-16 DIAGNOSIS — I259 Chronic ischemic heart disease, unspecified: Secondary | ICD-10-CM | POA: Diagnosis not present

## 2016-05-16 DIAGNOSIS — Z5181 Encounter for therapeutic drug level monitoring: Secondary | ICD-10-CM | POA: Diagnosis not present

## 2016-05-16 DIAGNOSIS — I482 Chronic atrial fibrillation, unspecified: Secondary | ICD-10-CM

## 2016-05-16 DIAGNOSIS — R0602 Shortness of breath: Secondary | ICD-10-CM

## 2016-05-16 DIAGNOSIS — I272 Other secondary pulmonary hypertension: Secondary | ICD-10-CM

## 2016-05-16 DIAGNOSIS — IMO0002 Reserved for concepts with insufficient information to code with codable children: Secondary | ICD-10-CM

## 2016-05-16 LAB — POCT INR: INR: 2.4

## 2016-05-16 MED ORDER — FUROSEMIDE 40 MG PO TABS: ORAL_TABLET | ORAL | Status: DC

## 2016-05-16 NOTE — Progress Notes (Signed)
Cardiology Office Note    Date:  05/16/2016   ID:  ESTERLENE SIMI, DOB October 09, 1934, MRN [id number]  PCP:  Fae Pippin  Cardiologist:   Candee Furbish, MD     History of Present Illness:  JONNETTE STOCKETT is a 82 y.o. female with history of bypass June 2008, CAD, hypertension, diastolic heart failure, chronic fatigue, mitral valve prolapse, hypothyroidism. Main complaints are weakness fatigue Shortness of breath. She reminded me that Dr. Mare Ferrari used to tell her to try to walk more, exercise. She had a chest x-ray done last year that was normal. Stress test and echocardiogram both reassuring in 2015. She does have secondary pulmonary hypertension on echocardiogram, nonsmoker.  Atrial fibrillation is been well controlled.  Her son is present with her on this visit. Suggested the possibility of water aerobics. This would be great.  She has a steep street in front of her house, she is worried about walking up this and she may have to stop and sit in the street.    Past Medical History  Diagnosis Date  . Chest discomfort   . Dizziness   . MVP (mitral valve prolapse)   . DOE (dyspnea on exertion)   . Bruises easily   . Back pain   . Forgetfulness   . Atrial fibrillation ([place])   . Hypertension   . Hypothyroidism   . Aortic stenosis   . Hyperlipidemia   . Diverticulitis   . Interstitial nephritis   . Neuropathy ([place])   . Ischemic heart disease   . Hx of cardiovascular stress test     Lexiscan Myoview (12/15):  Apical lateral and anterolateral fixed defect, no ischemia, EF 54%; low risk    Past Surgical History  Procedure Laterality Date  . Coronary artery bypass graft  04/2007  . Cardioversion  08/2008  . Ovarian cyst removal      Current Medications: Outpatient Prescriptions Prior to Visit  Medication Sig Dispense Refill  . allopurinol (ZYLOPRIM) 100 MG tablet Take 100 mg by mouth daily.    Marland Kitchen ALPRAZolam (XANAX) 0.25 MG tablet Take 1/2 tablet twice daily as  needed for anxiety 90 tablet 1  . fexofenadine (ALLEGRA) 60 MG tablet Take 60 mg by mouth 2 (two) times daily as needed (allergies).     . gabapentin (NEURONTIN) 800 MG tablet Take 0.5 tablets (400 mg total) by mouth every morning. Take 1 tablet by mouth in the evenings 30 tablet 0  . hydrocortisone (ANUSOL-HC) 2.5 % rectal cream Place 1 application rectally 2 (two) times daily as needed for hemorrhoids.    Marland Kitchen levothyroxine (SYNTHROID, LEVOTHROID) 25 MCG tablet TAKE 1 TABLET BY MOUTH EVERY DAY 30 tablet 2  . lisinopril (PRINIVIL,ZESTRIL) 10 MG tablet Take 1 tablet (10 mg total) by mouth daily. 90 tablet 3  . metoprolol succinate (TOPROL-XL) 25 MG 24 hr tablet Take 1 tablet (25 mg total) by mouth 2 (two) times daily. 180 tablet 3  . nitroGLYCERIN (NITROSTAT) 0.4 MG SL tablet Place 1 tablet (0.4 mg total) under the tongue every 5 (five) minutes as needed for chest pain. 25 tablet PRN  . pantoprazole (PROTONIX) 40 MG tablet Take 40 mg by mouth daily as needed (indigestion).    . potassium chloride SA (KLOR-CON M20) 20 MEQ tablet Take 2 tablets (40 mEq total) by mouth daily. 120 tablet 3  . warfarin (COUMADIN) 5 MG tablet TAKE AS DIRECTED BY COUMADIN CLINIC 90 tablet 0  . furosemide (LASIX) 40 MG tablet TAKE 1.5 TABLET  BY MOUTH 2 TIMES DAILY. PLEASE CALL AND MAKE APPOINTMENT 90 tablet 3   No facility-administered medications prior to visit.     Allergies:   Amiodarone; Crestor; and Lipitor   Social History   Social History  . Marital Status: Married    Spouse Name: N/A  . Number of Children: N/A  . Years of Education: N/A   Social History Main Topics  . Smoking status: Never Smoker   . Smokeless tobacco: None  . Alcohol Use: No  . Drug Use: No  . Sexual Activity: No   Other Topics Concern  . None   Social History Narrative     Family History:  The patient's family history includes Cerebral aneurysm in her father; Hypertension in her father. There is no history of Heart attack or  Stroke.   ROS:   Please see the history of present illness.    ROS All other systems reviewed and are negative.   PHYSICAL EXAM:   VS:  BP 124/84 mmHg  Pulse 73  Ht 5\' 8"  (1.727 m)  Wt 154 lb (69.854 kg)  BMI 23.42 kg/m2   GEN: Well nourished, well developed, in no acute distressUses a cane with ambulation HEENT: normal Neck: no JVD, carotid bruits, or masses Cardiac: Irregularly irregular, normal rate; no murmurs, rubs, or gallops,no edema  Respiratory:  clear to auscultation bilaterally, normal work of breathing GI: soft, nontender, nondistended, + BS MS: no deformity or atrophy Skin: warm and dry, no rash Neuro:  Alert and Oriented x 3, Strength and sensation are intact Psych: euthymic mood, full affect  Wt Readings from Last 3 Encounters:  05/16/16 154 lb (69.854 kg)  02/01/16 154 lb 1.9 oz (69.908 kg)  09/10/15 158 lb 12.8 oz (72.031 kg)      Studies/Labs Reviewed:   EKG:  EKG is ordered today.  05/16/16-atrial fibrillation with repolarization abnormality, no change from prior EKG personally reviewed.   Recent Labs: 02/01/2016: ALT 16; Hemoglobin 15.4*; Platelets 161 02/12/2016: BUN 16; Creat 0.92; Potassium 3.9; Sodium 137   Lipid Panel    Component Value Date/Time   CHOL 160 02/01/2016 1224   TRIG 185* 02/01/2016 1224   HDL 29* 02/01/2016 1224   CHOLHDL 5.5* 02/01/2016 1224   VLDL 37* 02/01/2016 1224   LDLCALC 94 02/01/2016 1224    Additional studies/ records that were reviewed today include:  Nuclear stress test 10/2014-low risk, no ischemia, EF 54%.  Chest x-ray-09/21/15-no active disease    ASSESSMENT:    1. Atrial fibrillation, unspecified   2. Essential hypertension   3. Benign hypertensive heart disease without heart failure   4. Chronic diastolic CHF (congestive heart failure) ([place])   5. Shortness of breath   6. Secondary pulmonary hypertension (H[facility])      PLAN:  In order of problems listed above:  Chronic diastolic heart failure-well  compensated. Some shortness of breath with activity. Encouraged walking, exercise. Consider water aerobics. Dr. Mare Ferrari used to encourage her to walk.   Secondary pulmonary hypertension-estimated pressures in the 50 range, likely secondary to previously underlying heart disease. Nonsmoker. Likely playing a role as well in her shortness of breath.  Chronic atrial fibrillation-well rate controlled  Chronic anticoagulation-stable, no bleeding, minor bruising  CAD/CABG-doing well, conservative management she favors last nuclear stress test reassuring.  Hyperlipidemia-intolerance of statins.  Hypertension, controlled    Medication Adjustments/Labs and Tests Ordered: Current medicines are reviewed at length with the patient today.  Concerns regarding medicines are outlined above.  Medication changes,  Labs and Tests ordered today are listed in the Patient Instructions below. Patient Instructions  Medication Instructions:  The current medical regimen is effective;  continue present plan and medications.  Follow-Up: Please follow up with Truitt Merle in 4 months. Follow up with Dr Marlou Porch in 8 months.  If you need a refill on your cardiac medications before your next appointment, please call your pharmacy.  Thank you for choosing [facility]!!         Signed, Candee Furbish, MD  05/16/2016 11:41 AM    [facility] [place], [place], [place]  [address] Phone: [phone]; Fax: [phone]

## 2016-05-16 NOTE — Patient Instructions (Signed)
Medication Instructions:  The current medical regimen is effective;  continue present plan and medications.  Follow-Up: Please follow up with Truitt Merle in 4 months. Follow up with Dr Marlou Porch in 8 months.  If you need a refill on your cardiac medications before your next appointment, please call your pharmacy.  Thank you for choosing [facility]!!

## 2016-06-01 ENCOUNTER — Other Ambulatory Visit: Payer: Self-pay | Admitting: Nurse Practitioner

## 2016-06-06 ENCOUNTER — Other Ambulatory Visit: Payer: Self-pay | Admitting: Cardiology

## 2016-06-11 ENCOUNTER — Ambulatory Visit (INDEPENDENT_AMBULATORY_CARE_PROVIDER_SITE_OTHER): Payer: Medicare Other | Admitting: *Deleted

## 2016-06-11 DIAGNOSIS — Z5181 Encounter for therapeutic drug level monitoring: Secondary | ICD-10-CM | POA: Diagnosis not present

## 2016-06-11 DIAGNOSIS — I482 Chronic atrial fibrillation, unspecified: Secondary | ICD-10-CM

## 2016-06-11 DIAGNOSIS — I4891 Unspecified atrial fibrillation: Secondary | ICD-10-CM

## 2016-06-11 LAB — POCT INR: INR: 2.8

## 2016-07-09 ENCOUNTER — Ambulatory Visit (INDEPENDENT_AMBULATORY_CARE_PROVIDER_SITE_OTHER): Payer: Medicare Other | Admitting: *Deleted

## 2016-07-09 DIAGNOSIS — Z5181 Encounter for therapeutic drug level monitoring: Secondary | ICD-10-CM

## 2016-07-09 DIAGNOSIS — I4891 Unspecified atrial fibrillation: Secondary | ICD-10-CM

## 2016-07-09 DIAGNOSIS — I482 Chronic atrial fibrillation, unspecified: Secondary | ICD-10-CM

## 2016-07-09 LAB — POCT INR: INR: 2.3

## 2016-07-09 MED ORDER — WARFARIN SODIUM 5 MG PO TABS: 5.0000 mg | ORAL_TABLET | ORAL | 0 refills | Status: DC

## 2016-07-16 ENCOUNTER — Encounter: Payer: Self-pay | Admitting: *Deleted

## 2016-07-16 DIAGNOSIS — G629 Polyneuropathy, unspecified: Secondary | ICD-10-CM | POA: Diagnosis not present

## 2016-07-16 DIAGNOSIS — E039 Hypothyroidism, unspecified: Secondary | ICD-10-CM | POA: Diagnosis not present

## 2016-07-16 DIAGNOSIS — Z6823 Body mass index (BMI) 23.0-23.9, adult: Secondary | ICD-10-CM | POA: Diagnosis not present

## 2016-07-16 DIAGNOSIS — M109 Gout, unspecified: Secondary | ICD-10-CM | POA: Diagnosis not present

## 2016-07-16 DIAGNOSIS — I2510 Atherosclerotic heart disease of native coronary artery without angina pectoris: Secondary | ICD-10-CM | POA: Diagnosis not present

## 2016-07-16 DIAGNOSIS — Z79899 Other long term (current) drug therapy: Secondary | ICD-10-CM | POA: Diagnosis not present

## 2016-08-04 ENCOUNTER — Telehealth: Payer: Self-pay | Admitting: Cardiology

## 2016-08-04 NOTE — Telephone Encounter (Signed)
Clearance needed to hold coumadin.  I will forward this message to the anticoagulation clinic to address.

## 2016-08-04 NOTE — Telephone Encounter (Signed)
New Message  Request for surgical clearance:  1. What type of surgery is being performed? Tooth Extraction  2. When is this surgery scheduled? 08/05/2016  3. Are there any medications that need to be held prior to surgery and how long? Coumadin  4. Name of physician performing surgery? [facility]  5. What is your office phone and fax number? [phone] Phone/ [phone] Fax

## 2016-08-04 NOTE — Telephone Encounter (Signed)
Spoke with dentist office to confirm procedure. She needs 2 teeth extracted total. Per son's request, pt will have these extractions done a week apart. Per protocol, pt is ok to continue her Coumadin for 2 separate single dental extractions. Clearance faxed to dentist office Dr. Melina Copa [phone]. Pt also made aware to continue her Coumadin uninterrupted.

## 2016-08-06 ENCOUNTER — Other Ambulatory Visit: Payer: Self-pay | Admitting: Cardiology

## 2016-08-06 ENCOUNTER — Other Ambulatory Visit: Payer: Self-pay

## 2016-08-06 ENCOUNTER — Ambulatory Visit (INDEPENDENT_AMBULATORY_CARE_PROVIDER_SITE_OTHER): Payer: Medicare Other | Admitting: *Deleted

## 2016-08-06 DIAGNOSIS — I4891 Unspecified atrial fibrillation: Secondary | ICD-10-CM | POA: Diagnosis not present

## 2016-08-06 DIAGNOSIS — Z5181 Encounter for therapeutic drug level monitoring: Secondary | ICD-10-CM

## 2016-08-06 LAB — POCT INR: INR: 2.1

## 2016-08-06 MED ORDER — FUROSEMIDE 40 MG PO TABS: ORAL_TABLET | ORAL | 11 refills | Status: DC

## 2016-08-07 ENCOUNTER — Telehealth: Payer: Self-pay | Admitting: Cardiology

## 2016-08-07 NOTE — Telephone Encounter (Signed)
[facility] has to refill protonix with PCP. [facility] verbal[facility]

## 2016-08-07 NOTE — Telephone Encounter (Signed)
Pharmacy requesting a refill on Pantoprazole 40 mg tablet. Would you like to refill? Please advise

## 2016-08-07 NOTE — Telephone Encounter (Signed)
Rx should be obtained from PCP.

## 2016-08-12 DIAGNOSIS — X32XXXD Exposure to sunlight, subsequent encounter: Secondary | ICD-10-CM | POA: Diagnosis not present

## 2016-08-12 DIAGNOSIS — L570 Actinic keratosis: Secondary | ICD-10-CM | POA: Diagnosis not present

## 2016-08-12 DIAGNOSIS — D225 Melanocytic nevi of trunk: Secondary | ICD-10-CM | POA: Diagnosis not present

## 2016-08-20 ENCOUNTER — Other Ambulatory Visit: Payer: Self-pay | Admitting: Cardiology

## 2016-08-20 ENCOUNTER — Other Ambulatory Visit: Payer: Self-pay

## 2016-08-20 MED ORDER — LISINOPRIL 10 MG PO TABS: 10.0000 mg | ORAL_TABLET | Freq: Every day | ORAL | 2 refills | Status: DC

## 2016-08-20 MED ORDER — METOPROLOL SUCCINATE ER 25 MG PO TB24: 25.0000 mg | ORAL_TABLET | Freq: Two times a day (BID) | ORAL | 1 refills | Status: DC

## 2016-08-26 ENCOUNTER — Encounter: Payer: Self-pay | Admitting: Nurse Practitioner

## 2016-08-26 DIAGNOSIS — Z23 Encounter for immunization: Secondary | ICD-10-CM | POA: Diagnosis not present

## 2016-09-09 ENCOUNTER — Ambulatory Visit (INDEPENDENT_AMBULATORY_CARE_PROVIDER_SITE_OTHER): Payer: Medicare Other | Admitting: Nurse Practitioner

## 2016-09-09 ENCOUNTER — Ambulatory Visit (INDEPENDENT_AMBULATORY_CARE_PROVIDER_SITE_OTHER): Payer: Medicare Other | Admitting: *Deleted

## 2016-09-09 ENCOUNTER — Encounter: Payer: Self-pay | Admitting: Nurse Practitioner

## 2016-09-09 VITALS — BP 122/80 | HR 68 | Ht 67.0 in | Wt 149.8 lb

## 2016-09-09 DIAGNOSIS — I5032 Chronic diastolic (congestive) heart failure: Secondary | ICD-10-CM | POA: Diagnosis not present

## 2016-09-09 DIAGNOSIS — I482 Chronic atrial fibrillation, unspecified: Secondary | ICD-10-CM

## 2016-09-09 DIAGNOSIS — I10 Essential (primary) hypertension: Secondary | ICD-10-CM

## 2016-09-09 DIAGNOSIS — I4891 Unspecified atrial fibrillation: Secondary | ICD-10-CM | POA: Diagnosis not present

## 2016-09-09 DIAGNOSIS — Z5181 Encounter for therapeutic drug level monitoring: Secondary | ICD-10-CM

## 2016-09-09 DIAGNOSIS — I259 Chronic ischemic heart disease, unspecified: Secondary | ICD-10-CM | POA: Diagnosis not present

## 2016-09-09 LAB — POCT INR: INR: 1.9

## 2016-09-09 NOTE — Patient Instructions (Addendum)
We will be checking the following labs today - NONE   Medication Instructions:    Continue with your current medicines.     Testing/Procedures To Be Arranged:  N/A  Follow-Up:   See Dr. Marlou Porch in 4 months.     Other Special Instructions:   Try to be as active as you can    If you need a refill on your cardiac medications before your next appointment, please call your pharmacy.   Call the [place] office at [phone] if you have any questions, problems or concerns.

## 2016-09-09 NOTE — Progress Notes (Signed)
CARDIOLOGY OFFICE NOTE  Date:  09/09/2016    Natasha Livingston Date of Birth: 05-31-1934 Medical Record [id number]  PCP:  Cyndi Bender, PA-C  Cardiologist:  Marisa Cyphers    Chief Complaint  Patient presents with  . Atrial Fibrillation    4 month check - seen for Dr. Marlou Porch    History of Present Illness: Natasha Livingston is a 82 y.o. female who presents today for a 4 month check. Seen for Dr. Marlou Porch. Former patient of Dr. Sherryl Barters.   She has a history of chronic atrial fibrillation, HTN, CAD with remote CABG in 2008 by Dr. Darcey Nora. Other issues include diastolic HF, chronic fatigue, MR due to MVP, hypothyroidism, and HTN.  Echocardiogram on 03/24/14 showed an ejection fraction of 50% which was an improvement from the previous echo of 40%. It was felt that some of this improvement was secondary to improvement in tachycardia mediated cardiomyopathy. She had a negative Myoview stress test on 10/23/14 showing no ischemia and her ejection fraction on that study was 54%.   I last saw her back in March - chronic issue of weakness. Saw Dr. Marlou Porch back in July - felt to be stable.   Comes in today. Here with her son today. She has had some gout. Apparently has not tolerated colchicine and she is just on allopurinol. She remains short of breath. Seems to be unchanged. No chest pain. She notes some burning in her feet. Worried about some varicose veins. No falls. Son wishes she was more active - he is still looking at a water program for her. She has caregivers 5 days a week. No problems with her coumadin noted. She notes she has had some labs last month by her PCP.  Past Medical History:  Diagnosis Date  . Aortic stenosis   . Atrial fibrillation ([place])   . Back pain   . Bruises easily   . Chest discomfort   . Diverticulitis   . Dizziness   . DOE (dyspnea on exertion)   . Forgetfulness   . Hx of cardiovascular stress test    Lexiscan Myoview (12/15):  Apical lateral and  anterolateral fixed defect, no ischemia, EF 54%; low risk  . Hyperlipidemia   . Hypertension   . Hypothyroidism   . Interstitial nephritis   . Ischemic heart disease   . MVP (mitral valve prolapse)   . Neuropathy [facility])     Past Surgical History:  Procedure Laterality Date  . CARDIOVERSION  08/2008  . CORONARY ARTERY BYPASS GRAFT  04/2007  . OVARIAN CYST REMOVAL       Medications: Current Outpatient Prescriptions  Medication Sig Dispense Refill  . allopurinol (ZYLOPRIM) 100 MG tablet Take 100 mg by mouth daily.    Marland Kitchen ALPRAZolam (XANAX) 0.25 MG tablet Take 1/2 tablet twice daily as needed for anxiety 90 tablet 1  . fexofenadine (ALLEGRA) 60 MG tablet Take 60 mg by mouth 2 (two) times daily as needed (allergies).     . furosemide (LASIX) 40 MG tablet TAKE 1.5 TABLET BY MOUTH 2 TIMES DAILY. 135 tablet 3  . gabapentin (NEURONTIN) 800 MG tablet Take 0.5 tablets (400 mg total) by mouth every morning. Take 1 tablet by mouth in the evenings 30 tablet 0  . hydrocortisone (ANUSOL-HC) 2.5 % rectal cream Place 1 application rectally 2 (two) times daily as needed for hemorrhoids.    Marland Kitchen levothyroxine (SYNTHROID, LEVOTHROID) 25 MCG tablet TAKE 1 TABLET BY MOUTH EVERY DAY 30 tablet 9  .  lisinopril (PRINIVIL,ZESTRIL) 10 MG tablet Take 1 tablet (10 mg total) by mouth daily. 90 tablet 2  . metoprolol succinate (TOPROL-XL) 25 MG 24 hr tablet Take 1 tablet (25 mg total) by mouth 2 (two) times daily. 180 tablet 1  . nitroGLYCERIN (NITROSTAT) 0.4 MG SL tablet Place 1 tablet (0.4 mg total) under the tongue every 5 (five) minutes as needed for chest pain. 25 tablet PRN  . pantoprazole (PROTONIX) 40 MG tablet Take 40 mg by mouth daily as needed (indigestion).    . potassium chloride SA (KLOR-CON M20) 20 MEQ tablet Take 2 tablets (40 mEq total) by mouth daily. 120 tablet 3  . warfarin (COUMADIN) 5 MG tablet Take 1 tablet (5 mg total) by mouth as directed. 90 tablet 0   No current facility-administered  medications for this visit.     Allergies: Allergies  Allergen Reactions  . Amiodarone Other (See Comments)    unknown  . Crestor [Rosuvastatin Calcium] Other (See Comments)    unknown  . Lipitor [Atorvastatin Calcium] Swelling    Social History: The patient  reports that she has never smoked. She has never used smokeless tobacco. She reports that she does not drink alcohol or use drugs.   Family History: The patient's family history includes Cerebral aneurysm in her father; Hypertension in her father.   Review of Systems: Please see the history of present illness.   Otherwise, the review of systems is positive for none.   All other systems are reviewed and negative.   Physical Exam: VS:  BP 122/80   Pulse 68   Ht 5\' 7"  (1.702 m)   Wt 149 lb 12.8 oz (67.9 kg)   BMI 23.46 kg/m  .  BMI Body mass index is 23.46 kg/m.  Wt Readings from Last 3 Encounters:  09/09/16 149 lb 12.8 oz (67.9 kg)  05/16/16 154 lb (69.9 kg)  02/01/16 154 lb 1.9 oz (69.9 kg)    General: Pleasant. Elderly female who is alert and in no acute distress.  Her weight is down 5 pounds.  HEENT: Normal.  Neck: Supple, no JVD, carotid bruits, or masses noted.  Cardiac: Irregular irregular rhythm. Rate is ok. Heart tones are distant. She has good distal pulses in her feet. No edema. Some varicose veins noted.   Respiratory:  Lungs are clear to auscultation bilaterally with normal work of breathing.  GI: Soft and nontender.  MS: No deformity or atrophy. Gait and ROM intact.  Skin: Warm and dry. Color is normal.  Neuro:  Strength and sensation are intact and no gross focal deficits noted.  Psych: Alert, appropriate and with normal affect.   LABORATORY DATA:  EKG:  EKG is not ordered today.   Lab Results  Component Value Date   WBC 4.4 02/01/2016   HGB 15.4 (H) 02/01/2016   HCT 44.0 02/01/2016   PLT 161 02/01/2016   GLUCOSE 98 02/12/2016   CHOL 160 02/01/2016   TRIG 185 (H) 02/01/2016   HDL 29 (L)  02/01/2016   LDLCALC 94 02/01/2016   ALT 16 02/01/2016   AST 29 02/01/2016   NA 137 02/12/2016   K 3.9 02/12/2016   CL 99 02/12/2016   CREATININE 0.92 02/12/2016   BUN 16 02/12/2016   CO2 25 02/12/2016   TSH 1.80 10/10/2014   INR 2.1 08/06/2016   HGBA1C (H) 04/08/2007    6.9 (NOTE)   The ADA recommends the following therapeutic goals for glycemic   control related to Hgb A1C measurement:  Goal of Therapy:   < 7.0% Hgb A1C   Action Suggested:  > 8.0% Hgb A1C   Ref:  Diabetes Care, 22, Suppl. 1, 1999   Lab Results  Component Value Date   INR 2.1 08/06/2016   INR 2.3 07/09/2016   INR 2.8 06/11/2016    BNP (last 3 results) No results for input(s): BNP in the last 8760 hours.  ProBNP (last 3 results) No results for input(s): PROBNP in the last 8760 hours.   Other Studies Reviewed Today:  Echo Study Conclusions from 03/2014  - Left ventricle: The cavity size was normal. The estimated ejection fraction was 50%. There is hypokinesis of the mid-apicalanterolateral myocardium. There is hypokinesis of the mid-apicalinferolateral myocardium. - Mitral valve: Mild calcification, with mild involvement of chords. There was mild to moderate regurgitation. - Left atrium: The atrium was moderately dilated. - Right ventricle: The cavity size was mildly dilated. Wall thickness was normal. - Right atrium: The atrium was moderately to severely dilated. - Tricuspid valve: There was moderate regurgitation. - Pulmonary arteries: PA peak pressure: 52 mm Hg (S).  Impressions:  - The right ventricular systolic pressure was increased consistent with moderate pulmonary hypertension.   Myoview Overall Impression from 10/2014: Low risk stress nuclear study. There is a small fixed defect of moderate intensity involving apical lateral and mid anterolateral myocardium. There is no significant reversibility. SDS 1  LV Ejection Fraction: 54%. LV Wall Motion: NL LV Function; NL Wall  Motion   Darlin Coco MD  Assessment/Plan: 1. Chronic diastolic CHF - she looks compensated. Weight is down some. Breathing is unchanged.   2. Chronic/persistent atrial fibrillation: managed with rate control and anticoagulation - checking INR today.   3. CAD with remote CABG - no chest pain reported. Would favor conservative management.   4. HLD - intolerant of statins  5. Cardiomyopathy: EF normal on last nuclear study . Continue beta blocker, ACEI.   6. Essential hypertension: Controlled.   7. Secondary pulmonary hypertension-estimated pressures in the 50 range, likely secondary to previously underlying heart disease. Nonsmoker. Likely playing a role as well in her shortness of breath.   Current medicines are reviewed with the patient today.  The patient does not have concerns regarding medicines other than what has been noted above.  The following changes have been made:  See above.  Labs/ tests ordered today include:   No orders of the defined types were placed in this encounter.    Disposition:   FU with Dr. Marlou Porch in 4 months.    Patient is agreeable to this plan and will call if any problems develop in the interim.   Signed: Burtis Junes, RN, ANP-C 09/09/2016 11:04 AM  [facility] [address] [place], [place]  [address] Phone: [phone] Fax: [phone]

## 2016-09-12 ENCOUNTER — Ambulatory Visit: Payer: Medicare Other | Admitting: Nurse Practitioner

## 2016-10-07 ENCOUNTER — Ambulatory Visit (INDEPENDENT_AMBULATORY_CARE_PROVIDER_SITE_OTHER): Payer: Medicare Other | Admitting: *Deleted

## 2016-10-07 DIAGNOSIS — Z5181 Encounter for therapeutic drug level monitoring: Secondary | ICD-10-CM

## 2016-10-07 DIAGNOSIS — I4891 Unspecified atrial fibrillation: Secondary | ICD-10-CM | POA: Diagnosis not present

## 2016-10-07 DIAGNOSIS — I259 Chronic ischemic heart disease, unspecified: Secondary | ICD-10-CM

## 2016-10-07 LAB — POCT INR: INR: 2.5

## 2016-10-17 ENCOUNTER — Other Ambulatory Visit: Payer: Self-pay | Admitting: Cardiology

## 2016-10-26 ENCOUNTER — Other Ambulatory Visit: Payer: Self-pay | Admitting: Cardiology

## 2016-11-11 ENCOUNTER — Ambulatory Visit (INDEPENDENT_AMBULATORY_CARE_PROVIDER_SITE_OTHER): Payer: Medicare Other | Admitting: *Deleted

## 2016-11-11 DIAGNOSIS — Z5181 Encounter for therapeutic drug level monitoring: Secondary | ICD-10-CM

## 2016-11-11 DIAGNOSIS — I4891 Unspecified atrial fibrillation: Secondary | ICD-10-CM

## 2016-11-11 LAB — POCT INR: INR: 2.7

## 2016-12-09 ENCOUNTER — Ambulatory Visit (INDEPENDENT_AMBULATORY_CARE_PROVIDER_SITE_OTHER): Payer: Medicare Other

## 2016-12-09 DIAGNOSIS — I4891 Unspecified atrial fibrillation: Secondary | ICD-10-CM

## 2016-12-09 DIAGNOSIS — Z5181 Encounter for therapeutic drug level monitoring: Secondary | ICD-10-CM

## 2016-12-09 LAB — POCT INR: INR: 2.6

## 2017-01-06 ENCOUNTER — Encounter: Payer: Self-pay | Admitting: Cardiology

## 2017-01-13 DIAGNOSIS — Z9181 History of falling: Secondary | ICD-10-CM | POA: Diagnosis not present

## 2017-01-13 DIAGNOSIS — I4891 Unspecified atrial fibrillation: Secondary | ICD-10-CM | POA: Diagnosis not present

## 2017-01-13 DIAGNOSIS — E039 Hypothyroidism, unspecified: Secondary | ICD-10-CM | POA: Diagnosis not present

## 2017-01-13 DIAGNOSIS — M109 Gout, unspecified: Secondary | ICD-10-CM | POA: Diagnosis not present

## 2017-01-13 DIAGNOSIS — F419 Anxiety disorder, unspecified: Secondary | ICD-10-CM | POA: Diagnosis not present

## 2017-01-13 DIAGNOSIS — E2839 Other primary ovarian failure: Secondary | ICD-10-CM | POA: Diagnosis not present

## 2017-01-13 DIAGNOSIS — I2510 Atherosclerotic heart disease of native coronary artery without angina pectoris: Secondary | ICD-10-CM | POA: Diagnosis not present

## 2017-01-13 DIAGNOSIS — G629 Polyneuropathy, unspecified: Secondary | ICD-10-CM | POA: Diagnosis not present

## 2017-01-13 DIAGNOSIS — L309 Dermatitis, unspecified: Secondary | ICD-10-CM | POA: Diagnosis not present

## 2017-01-13 DIAGNOSIS — L03116 Cellulitis of left lower limb: Secondary | ICD-10-CM | POA: Diagnosis not present

## 2017-01-14 ENCOUNTER — Ambulatory Visit (INDEPENDENT_AMBULATORY_CARE_PROVIDER_SITE_OTHER): Payer: Medicare Other | Admitting: *Deleted

## 2017-01-14 ENCOUNTER — Ambulatory Visit (INDEPENDENT_AMBULATORY_CARE_PROVIDER_SITE_OTHER): Payer: Medicare Other | Admitting: Cardiology

## 2017-01-14 ENCOUNTER — Encounter: Payer: Self-pay | Admitting: Cardiology

## 2017-01-14 VITALS — BP 110/64 | HR 80 | Ht 67.0 in | Wt 150.0 lb

## 2017-01-14 DIAGNOSIS — Z789 Other specified health status: Secondary | ICD-10-CM

## 2017-01-14 DIAGNOSIS — I5032 Chronic diastolic (congestive) heart failure: Secondary | ICD-10-CM

## 2017-01-14 DIAGNOSIS — I10 Essential (primary) hypertension: Secondary | ICD-10-CM | POA: Diagnosis not present

## 2017-01-14 DIAGNOSIS — I2510 Atherosclerotic heart disease of native coronary artery without angina pectoris: Secondary | ICD-10-CM

## 2017-01-14 DIAGNOSIS — Z5181 Encounter for therapeutic drug level monitoring: Secondary | ICD-10-CM

## 2017-01-14 DIAGNOSIS — E7800 Pure hypercholesterolemia, unspecified: Secondary | ICD-10-CM | POA: Diagnosis not present

## 2017-01-14 DIAGNOSIS — I4891 Unspecified atrial fibrillation: Secondary | ICD-10-CM | POA: Diagnosis not present

## 2017-01-14 DIAGNOSIS — I4821 Permanent atrial fibrillation: Secondary | ICD-10-CM

## 2017-01-14 DIAGNOSIS — I482 Chronic atrial fibrillation: Secondary | ICD-10-CM

## 2017-01-14 LAB — POCT INR: INR: 2.8

## 2017-01-14 NOTE — Patient Instructions (Signed)
Medication Instructions:  The current medical regimen is effective;  continue present plan and medications.  Follow-Up: Follow up in 6 months with Lori Gerhardt, NP.  You will receive a letter in the mail 2 months before you are due.  Please call us when you receive this letter to schedule your follow up appointment.  If you need a refill on your cardiac medications before your next appointment, please call your pharmacy.  Thank you for choosing [facility]!!     

## 2017-01-14 NOTE — Progress Notes (Signed)
Cardiology Office Note    Date:  01/14/2017   ID:  Zo, [place] [birth year], MRN [id number]  PCP:  Cyndi Bender, PA-C  Cardiologist:   Candee Furbish, MD     History of Present Illness:  Natasha Livingston is a 83 y.o. female with history of CABG/CAD in June 2008,  permanent atrial fibrillation, hypertension, diastolic heart failure, chronic fatigue, mitral valve prolapse, hypothyroidism. Overall she is feeling quite well. No significant changes in her symptoms, she did not mention any significant shortness of breath today although in the past she has mentioned this. No chest pain, no orthopnea. No bleeding episodes, minor bruising noted.  She recently has been having some gout in her feet   Stress test and echocardiogram both reassuring in 2015. She does have secondary pulmonary hypertension on echocardiogram, nonsmoker.  Atrial fibrillation has been well controlled.  Her son is present with her on this visit.    Past Medical History:  Diagnosis Date  . Aortic stenosis   . Atrial fibrillation ([place])   . Back pain   . Bruises easily   . Chest discomfort   . Diverticulitis   . Dizziness   . DOE (dyspnea on exertion)   . Forgetfulness   . Hx of cardiovascular stress test    Lexiscan Myoview (12/15):  Apical lateral and anterolateral fixed defect, no ischemia, EF 54%; low risk  . Hyperlipidemia   . Hypertension   . Hypothyroidism   . Interstitial nephritis   . Ischemic heart disease   . MVP (mitral valve prolapse)   . Neuropathy [facility])     Past Surgical History:  Procedure Laterality Date  . CARDIOVERSION  08/2008  . CORONARY ARTERY BYPASS GRAFT  04/2007  . OVARIAN CYST REMOVAL      Current Medications: Outpatient Medications Prior to Visit  Medication Sig Dispense Refill  . allopurinol (ZYLOPRIM) 100 MG tablet Take 100 mg by mouth daily.    Marland Kitchen ALPRAZolam (XANAX) 0.25 MG tablet Take 1/2 tablet twice daily as needed for anxiety 90 tablet 1  . fexofenadine  (ALLEGRA) 60 MG tablet Take 60 mg by mouth 2 (two) times daily as needed (allergies).     . furosemide (LASIX) 40 MG tablet TAKE 1.5 TABLET BY MOUTH 2 TIMES DAILY. 135 tablet 3  . gabapentin (NEURONTIN) 800 MG tablet Take 0.5 tablets (400 mg total) by mouth every morning. Take 1 tablet by mouth in the evenings 30 tablet 0  . hydrocortisone (ANUSOL-HC) 2.5 % rectal cream Place 1 application rectally 2 (two) times daily as needed for hemorrhoids.    Marland Kitchen KLOR-CON M20 20 MEQ tablet TAKE 2 TABLETS BY MOUTH DAILY. 120 tablet 3  . levothyroxine (SYNTHROID, LEVOTHROID) 25 MCG tablet TAKE 1 TABLET BY MOUTH EVERY DAY 30 tablet 9  . lisinopril (PRINIVIL,ZESTRIL) 10 MG tablet Take 1 tablet (10 mg total) by mouth daily. 90 tablet 2  . metoprolol succinate (TOPROL-XL) 25 MG 24 hr tablet Take 1 tablet (25 mg total) by mouth 2 (two) times daily. 180 tablet 1  . nitroGLYCERIN (NITROSTAT) 0.4 MG SL tablet Place 1 tablet (0.4 mg total) under the tongue every 5 (five) minutes as needed for chest pain. 25 tablet PRN  . pantoprazole (PROTONIX) 40 MG tablet Take 40 mg by mouth daily as needed (indigestion).    . warfarin (COUMADIN) 5 MG tablet Take as directed by [facility] Clinic 90 tablet 1   No facility-administered medications prior to visit.      Allergies:  Amiodarone; Crestor [rosuvastatin calcium]; and Lipitor [atorvastatin calcium]   Social History   Social History  . Marital status: Married    Spouse name: N/A  . Number of children: N/A  . Years of education: N/A   Social History Main Topics  . Smoking status: Never Smoker  . Smokeless tobacco: Never Used  . Alcohol use No  . Drug use: No  . Sexual activity: No   Other Topics Concern  . None   Social History Narrative  . None     Family History:  The patient's family history includes Cerebral aneurysm in her father; Hypertension in her father.   ROS:   Please see the history of present illness.    ROS All other systems reviewed and are  negative.   PHYSICAL EXAM:   VS:  BP 110/64   Pulse 80   Ht 5\' 7"  (1.702 m)   Wt 150 lb (68 kg)   BMI 23.49 kg/m    GEN: Well nourished, well developed, in no acute distress Holds on to her son to ambulate. HEENT: normal  Neck: no JVD, carotid bruits, or masses Cardiac: Irregularly irregular, normal rate; no significant murmurs, rubs, or gallops,no edema  Respiratory:  clear to auscultation bilaterally, normal work of breathing GI: soft, nontender, nondistended, + BS MS: no deformity or atrophy  Skin: warm and dry, no rash Neuro:  Alert and Oriented x 3, Strength and sensation are intact Psych: euthymic mood, full affect No significant change from prior exam.  Wt Readings from Last 3 Encounters:  01/14/17 150 lb (68 kg)  09/09/16 149 lb 12.8 oz (67.9 kg)  05/16/16 154 lb (69.9 kg)      Studies/Labs Reviewed:   EKG:  EKG is ordered today. 01/14/17-atrial fibrillation heart rate 80 with LVH repolarization like abnormalities personally viewed-prior 05/16/16-atrial fibrillation with repolarization abnormality, no change from prior EKG personally reviewed.   Recent Labs: 02/01/2016: ALT 16; Hemoglobin 15.4; Platelets 161 02/12/2016: BUN 16; Creat 0.92; Potassium 3.9; Sodium 137   Lipid Panel    Component Value Date/Time   CHOL 160 02/01/2016 1224   TRIG 185 (H) 02/01/2016 1224   HDL 29 (L) 02/01/2016 1224   CHOLHDL 5.5 (H) 02/01/2016 1224   VLDL 37 (H) 02/01/2016 1224   LDLCALC 94 02/01/2016 1224    Additional studies/ records that were reviewed today include:  Nuclear stress test 10/2014-low risk, no ischemia, EF 54%.  Chest x-ray-09/21/15-no active disease  Echocardiogram 03/24/14:  - Left ventricle: The cavity size was normal. The estimated ejection fraction was 50%. There is hypokinesis of the mid-apicalanterolateral myocardium. There is hypokinesis of the mid-apicalinferolateral myocardium. - Mitral valve: Mild calcification, with mild involvement  of chords. There was mild to moderate regurgitation. - Left atrium: The atrium was moderately dilated. - Right ventricle: The cavity size was mildly dilated. Wall thickness was normal. - Right atrium: The atrium was moderately to severely dilated. - Tricuspid valve: There was moderate regurgitation. - Pulmonary arteries: PA peak pressure: 52 mm Hg (S).  Impressions:  - The right ventricular systolic pressure was increased consistent with moderate pulmonary hypertension.  ASSESSMENT:    1. Chronic diastolic CHF (congestive heart failure) ([place])   2. Essential hypertension   3. Permanent atrial fibrillation ([place])   4. Coronary artery disease involving native coronary artery of native heart without angina pectoris   5. Pure hypercholesterolemia   6. Statin intolerance      PLAN:  In order of problems listed above:  Chronic  diastolic heart failure-No evidence of fluid overload, she is quite comfortable. No significant shortness of breath with activity. No hospitalizations.Encouraged walking, exercise.   Secondary pulmonary hypertension-estimated pressures in the 50 range, likely secondary to previously underlying heart disease, elevated left atrial filling pressures. Nonsmoker. Continue to treat the underlying heart disease.  Permanent atrial fibrillation-well rate controlled. She is on Toprol 25 mg. No changes made.  Chronic anticoagulation-stable, no bleeding, minor bruising, forearm. She takes warfarin. CHADS-VASc 4 (Age>75, Female, HTN, CAD). I discussed with she and her son today the possibility of transitioning over to Eliquis. They will think about this. [facility] the [facility]. She has been fairly stable with her INR management.   CAD/CABG-doing well, conservative management. No anginal symptoms.  Hyperlipidemia-intolerance of statins. She does not wish to retry.  Hypertension, controlled-overall doing well.    Medication Adjustments/Labs  and Tests Ordered: Current medicines are reviewed at length with the patient today.  Concerns regarding medicines are outlined above.  Medication changes, Labs and Tests ordered today are listed in the Patient Instructions below. Patient Instructions  Medication Instructions:  The current medical regimen is effective;  continue present plan and medications.  Follow-Up: Follow up in 6 months with Truitt Merle, NP.  You will receive a letter in the mail 2 months before you are due.  Please call us when you receive this letter to schedule your follow up appointment.  If you need a refill on your cardiac medications before your next appointment, please call your pharmacy.  Thank you for choosing [facility]!!         Signed, Candee Furbish, MD  01/14/2017 11:41 AM    [facility] [place], [place], [place]  [address] Phone: [phone]; Fax: [phone]

## 2017-01-15 ENCOUNTER — Other Ambulatory Visit: Payer: Self-pay | Admitting: Physician Assistant

## 2017-01-15 ENCOUNTER — Telehealth: Payer: Self-pay | Admitting: *Deleted

## 2017-01-15 DIAGNOSIS — Z1231 Encounter for screening mammogram for malignant neoplasm of breast: Secondary | ICD-10-CM

## 2017-01-15 DIAGNOSIS — E2839 Other primary ovarian failure: Secondary | ICD-10-CM

## 2017-01-15 NOTE — Telephone Encounter (Signed)
Called pt for a follow up regarding the antibiotic she is taking. The pt is taking Keflex 500mg  three times a day & she started the med on yesterday after getting it filled yesterday. Advised the med does not interfere with Coumadin & is safe to take. Advised to call back with any new meds.

## 2017-02-06 DIAGNOSIS — N390 Urinary tract infection, site not specified: Secondary | ICD-10-CM | POA: Diagnosis not present

## 2017-02-06 DIAGNOSIS — K6289 Other specified diseases of anus and rectum: Secondary | ICD-10-CM | POA: Diagnosis not present

## 2017-02-12 ENCOUNTER — Other Ambulatory Visit: Payer: Self-pay | Admitting: Cardiology

## 2017-02-13 DIAGNOSIS — S01511A Laceration without foreign body of lip, initial encounter: Secondary | ICD-10-CM | POA: Diagnosis not present

## 2017-02-13 DIAGNOSIS — Z6823 Body mass index (BMI) 23.0-23.9, adult: Secondary | ICD-10-CM | POA: Diagnosis not present

## 2017-02-13 DIAGNOSIS — Z23 Encounter for immunization: Secondary | ICD-10-CM | POA: Diagnosis not present

## 2017-02-24 ENCOUNTER — Ambulatory Visit (INDEPENDENT_AMBULATORY_CARE_PROVIDER_SITE_OTHER): Payer: Medicare Other | Admitting: *Deleted

## 2017-02-24 DIAGNOSIS — I4891 Unspecified atrial fibrillation: Secondary | ICD-10-CM | POA: Diagnosis not present

## 2017-02-24 DIAGNOSIS — I2510 Atherosclerotic heart disease of native coronary artery without angina pectoris: Secondary | ICD-10-CM

## 2017-02-24 DIAGNOSIS — Z5181 Encounter for therapeutic drug level monitoring: Secondary | ICD-10-CM

## 2017-02-24 LAB — POCT INR: INR: 2.7

## 2017-03-05 DIAGNOSIS — Z6823 Body mass index (BMI) 23.0-23.9, adult: Secondary | ICD-10-CM | POA: Diagnosis not present

## 2017-03-05 DIAGNOSIS — M109 Gout, unspecified: Secondary | ICD-10-CM | POA: Diagnosis not present

## 2017-03-17 ENCOUNTER — Other Ambulatory Visit: Payer: Self-pay | Admitting: *Deleted

## 2017-03-17 MED ORDER — NITROGLYCERIN 0.4 MG SL SUBL: 0.4000 mg | SUBLINGUAL_TABLET | SUBLINGUAL | 1 refills | Status: DC | PRN

## 2017-03-20 DIAGNOSIS — Z780 Asymptomatic menopausal state: Secondary | ICD-10-CM | POA: Diagnosis not present

## 2017-03-20 DIAGNOSIS — M8588 Other specified disorders of bone density and structure, other site: Secondary | ICD-10-CM | POA: Diagnosis not present

## 2017-03-31 ENCOUNTER — Ambulatory Visit (INDEPENDENT_AMBULATORY_CARE_PROVIDER_SITE_OTHER): Payer: Medicare Other | Admitting: Pharmacist

## 2017-03-31 ENCOUNTER — Ambulatory Visit: Payer: Medicare Other

## 2017-03-31 DIAGNOSIS — Z5181 Encounter for therapeutic drug level monitoring: Secondary | ICD-10-CM

## 2017-03-31 DIAGNOSIS — I4891 Unspecified atrial fibrillation: Secondary | ICD-10-CM

## 2017-03-31 DIAGNOSIS — I2510 Atherosclerotic heart disease of native coronary artery without angina pectoris: Secondary | ICD-10-CM | POA: Diagnosis not present

## 2017-03-31 LAB — POCT INR: INR: 2.3

## 2017-03-31 NOTE — Progress Notes (Unsigned)
Patient ID: Natasha Livingston                 DOB: 1934-06-11                      MRN: [id number]     HPI: Natasha Livingston is a 83 y.o. female referred by Dr. Marland Kitchen to HTN clinic.  Current HTN meds:  Previously tried:  BP goal:   Family History:   Social History:   Diet:   Exercise:   Home BP readings:   Wt Readings from Last 3 Encounters:  01/14/17 150 lb (68 kg)  09/09/16 149 lb 12.8 oz (67.9 kg)  05/16/16 154 lb (69.9 kg)   BP Readings from Last 3 Encounters:  01/14/17 110/64  09/09/16 122/80  05/16/16 124/84   Pulse Readings from Last 3 Encounters:  01/14/17 80  09/09/16 68  05/16/16 73    Renal function: CrCl cannot be calculated (Patient's most recent lab result is older than the maximum 21 days allowed.).  Past Medical History:  Diagnosis Date  . Aortic stenosis   . Atrial fibrillation ([place])   . Back pain   . Bruises easily   . Chest discomfort   . Diverticulitis   . Dizziness   . DOE (dyspnea on exertion)   . Forgetfulness   . Hx of cardiovascular stress test    Lexiscan Myoview (12/15):  Apical lateral and anterolateral fixed defect, no ischemia, EF 54%; low risk  . Hyperlipidemia   . Hypertension   . Hypothyroidism   . Interstitial nephritis   . Ischemic heart disease   . MVP (mitral valve prolapse)   . Neuropathy [facility])     Current Outpatient Prescriptions on File Prior to Visit  Medication Sig Dispense Refill  . allopurinol (ZYLOPRIM) 100 MG tablet Take 100 mg by mouth daily.    Marland Kitchen ALPRAZolam (XANAX) 0.25 MG tablet Take 1/2 tablet twice daily as needed for anxiety 90 tablet 1  . cephALEXin (KEFLEX) 500 MG capsule Take 500 mg by mouth 3 (three) times daily.    . fexofenadine (ALLEGRA) 60 MG tablet Take 60 mg by mouth 2 (two) times daily as needed (allergies).     . furosemide (LASIX) 40 MG tablet TAKE 1.5 TABLET BY MOUTH 2 TIMES DAILY. 135 tablet 3  . gabapentin (NEURONTIN) 800 MG tablet Take 0.5 tablets (400 mg total) by mouth every  morning. Take 1 tablet by mouth in the evenings 30 tablet 0  . hydrocortisone (ANUSOL-HC) 2.5 % rectal cream Place 1 application rectally 2 (two) times daily as needed for hemorrhoids.    Marland Kitchen KLOR-CON M20 20 MEQ tablet TAKE 2 TABLETS BY MOUTH DAILY. 120 tablet 3  . levothyroxine (SYNTHROID, LEVOTHROID) 25 MCG tablet TAKE 1 TABLET BY MOUTH EVERY DAY 30 tablet 9  . lisinopril (PRINIVIL,ZESTRIL) 10 MG tablet Take 1 tablet (10 mg total) by mouth daily. 90 tablet 2  . metoprolol succinate (TOPROL-XL) 25 MG 24 hr tablet TAKE 1 TABLET BY MOUTH 2 (TWO) TIMES DAILY. 180 tablet 3  . nitroGLYCERIN (NITROSTAT) 0.4 MG SL tablet Place 1 tablet (0.4 mg total) under the tongue every 5 (five) minutes as needed for chest pain. 25 tablet 1  . pantoprazole (PROTONIX) 40 MG tablet Take 40 mg by mouth daily as needed (indigestion).    . warfarin (COUMADIN) 5 MG tablet Take as directed by Coumadin Clinic 90 tablet 1   No current facility-administered medications on file prior to visit.  Allergies  Allergen Reactions  . Amiodarone Other (See Comments)    unknown  . Crestor [Rosuvastatin Calcium] Other (See Comments)    unknown  . Lipitor [Atorvastatin Calcium] Swelling     Assessment/Plan:  1. Medication management -   Melburn Popper, PharmD, [place] PGY2 Pharmacy Resident 03/31/17 8:48 AM

## 2017-04-10 ENCOUNTER — Other Ambulatory Visit: Payer: Self-pay | Admitting: Cardiology

## 2017-04-13 DIAGNOSIS — E669 Obesity, unspecified: Secondary | ICD-10-CM | POA: Diagnosis not present

## 2017-04-13 DIAGNOSIS — Z1231 Encounter for screening mammogram for malignant neoplasm of breast: Secondary | ICD-10-CM | POA: Diagnosis not present

## 2017-04-13 DIAGNOSIS — Z1389 Encounter for screening for other disorder: Secondary | ICD-10-CM | POA: Diagnosis not present

## 2017-04-13 DIAGNOSIS — N959 Unspecified menopausal and perimenopausal disorder: Secondary | ICD-10-CM | POA: Diagnosis not present

## 2017-04-13 DIAGNOSIS — Z9181 History of falling: Secondary | ICD-10-CM | POA: Diagnosis not present

## 2017-04-13 DIAGNOSIS — E785 Hyperlipidemia, unspecified: Secondary | ICD-10-CM | POA: Diagnosis not present

## 2017-04-13 DIAGNOSIS — Z136 Encounter for screening for cardiovascular disorders: Secondary | ICD-10-CM | POA: Diagnosis not present

## 2017-04-13 DIAGNOSIS — Z0000 Encounter for general adult medical examination without abnormal findings: Secondary | ICD-10-CM | POA: Diagnosis not present

## 2017-04-30 ENCOUNTER — Ambulatory Visit: Payer: Medicare Other | Admitting: Pharmacist

## 2017-04-30 ENCOUNTER — Ambulatory Visit (INDEPENDENT_AMBULATORY_CARE_PROVIDER_SITE_OTHER): Payer: Medicare Other | Admitting: Pharmacist

## 2017-04-30 ENCOUNTER — Encounter: Payer: Self-pay | Admitting: Pharmacist

## 2017-04-30 DIAGNOSIS — Z5181 Encounter for therapeutic drug level monitoring: Secondary | ICD-10-CM

## 2017-04-30 DIAGNOSIS — I4891 Unspecified atrial fibrillation: Secondary | ICD-10-CM | POA: Diagnosis not present

## 2017-04-30 LAB — POCT INR: INR: 2.9

## 2017-04-30 NOTE — Patient Instructions (Signed)
Thanks for coming in today! If you have any questions about your medications please feel free to ask!

## 2017-04-30 NOTE — Progress Notes (Signed)
Patient ID: Natasha Livingston                 DOB: Dec 05, 1933                      MRN: [id number]     HPI: Natasha Livingston is a 83 y.o. female patient of Dr. Marlou Porch who presents today for medication management.  PMH includes CABG/CAD in June 2008,  permanent atrial fibrillation, hypertension, diastolic heart failure, chronic fatigue, mitral valve prolapse, hypothyroidism. At a previous INR check she presented with several questions about her medications. She was subsequently scheduled to see a pharmacist to help with medication management.   Pt presents for INR check and medication management. She is with her son and daughterinlaw, who help with her health. They state that a few weeks ago the patient was rather depressed and confused and having several issues with bloating. They state a lot of this has since cleared, but these episodes made them wonder if she is on the appropriate medications. They believe that the depression and confusion is related to seasonal depression. They are however concerned that long term her medications could be contributing to confusion.   Each medication is discussed at length today including indication, correct dose and possible side effects.   The family's other concern is her SOB. They ask about her most recent ECHO since she has been struggling with SOB when walking from room to room in her home and while this has been going on for some time this seems to have progressively gotten worse. She sleeps with head portion of the bed is raised she states that she gets extremely short of breath if she sleeps with the bed flat. She sleeps with the bed about 45 degrees angle to help with SOB. The daughter is concerned that this is an indicator of advancing Heart failure - she works for an [employer] that follows these symptoms.     Wt Readings from Last 3 Encounters:  01/14/17 150 lb (68 kg)  09/09/16 149 lb 12.8 oz (67.9 kg)  05/16/16 154 lb (69.9 kg)   BP Readings from Last 3  Encounters:  01/14/17 110/64  09/09/16 122/80  05/16/16 124/84   Pulse Readings from Last 3 Encounters:  01/14/17 80  09/09/16 68  05/16/16 73    Renal function: CrCl cannot be calculated (Patient's most recent lab result is older than the maximum 21 days allowed.).  Past Medical History:  Diagnosis Date  . Aortic stenosis   . Atrial fibrillation ([place])   . Back pain   . Bruises easily   . Chest discomfort   . Diverticulitis   . Dizziness   . DOE (dyspnea on exertion)   . Forgetfulness   . Hx of cardiovascular stress test    Lexiscan Myoview (12/15):  Apical lateral and anterolateral fixed defect, no ischemia, EF 54%; low risk  . Hyperlipidemia   . Hypertension   . Hypothyroidism   . Interstitial nephritis   . Ischemic heart disease   . MVP (mitral valve prolapse)   . Neuropathy [facility])     Current Outpatient Prescriptions on File Prior to Visit  Medication Sig Dispense Refill  . allopurinol (ZYLOPRIM) 100 MG tablet Take 100 mg by mouth daily.    Marland Kitchen ALPRAZolam (XANAX) 0.25 MG tablet Take 1/2 tablet twice daily as needed for anxiety 90 tablet 1  . cephALEXin (KEFLEX) 500 MG capsule Take 500 mg by mouth 3 (three) times daily.    Marland Kitchen  fexofenadine (ALLEGRA) 60 MG tablet Take 60 mg by mouth 2 (two) times daily as needed (allergies).     . furosemide (LASIX) 40 MG tablet TAKE 1.5 TABLET BY MOUTH 2 TIMES DAILY. 135 tablet 3  . gabapentin (NEURONTIN) 800 MG tablet Take 0.5 tablets (400 mg total) by mouth every morning. Take 1 tablet by mouth in the evenings 30 tablet 0  . hydrocortisone (ANUSOL-HC) 2.5 % rectal cream Place 1 application rectally 2 (two) times daily as needed for hemorrhoids.    Marland Kitchen KLOR-CON M20 20 MEQ tablet TAKE 2 TABLETS BY MOUTH DAILY. 120 tablet 3  . levothyroxine (SYNTHROID, LEVOTHROID) 25 MCG tablet TAKE 1 TABLET BY MOUTH EVERY DAY 30 tablet 9  . lisinopril (PRINIVIL,ZESTRIL) 10 MG tablet Take 1 tablet (10 mg total) by mouth daily. 90 tablet 2  . metoprolol  succinate (TOPROL-XL) 25 MG 24 hr tablet TAKE 1 TABLET BY MOUTH 2 (TWO) TIMES DAILY. 180 tablet 3  . nitroGLYCERIN (NITROSTAT) 0.4 MG SL tablet Place 1 tablet (0.4 mg total) under the tongue every 5 (five) minutes as needed for chest pain. 25 tablet 1  . pantoprazole (PROTONIX) 40 MG tablet Take 40 mg by mouth daily as needed (indigestion).    . warfarin (COUMADIN) 5 MG tablet TAKE AS DIRECTED BY COUMADIN CLINIC 90 tablet 1   No current facility-administered medications on file prior to visit.     Allergies  Allergen Reactions  . Amiodarone Other (See Comments)    unknown  . Crestor [Rosuvastatin Calcium] Other (See Comments)    unknown  . Lipitor [Atorvastatin Calcium] Swelling    There were no vitals taken for this visit.   Assessment/Plan: Medication Management: Each medication is discussed at length today. The following recommendations were made:  Alprazolam - she has been taking BID for unknown reason - advised to discuss with PCP but this could likely be weaned to daily then PRN (as it appears it was prescribed PRN)  Protonix - pt takes 1 capsule as needed for heart burn. Advised medication does not work effectively this way. Since she has not needed this recently advised to use Tums if needed. If needs Tums more than a few times per week take short course of Protonix to help with heart burn.   Shortness of Breath: Will route to Dr. Marlou Porch to address if further work up is warranted.    Thank you, Lelan Pons. Patterson Hammersmith, [facility]  04/30/2017 7:50 AM

## 2017-05-07 ENCOUNTER — Telehealth: Payer: Self-pay | Admitting: Cardiology

## 2017-05-07 DIAGNOSIS — R0609 Other forms of dyspnea: Principal | ICD-10-CM

## 2017-05-07 NOTE — Telephone Encounter (Addendum)
  Please order ECHO - dyspnea Thanks Candee Furbish, MD   ----- Message from Erskine Emery, [facility] sent at 04/30/2017  1:33 PM EDT ----- Dr. Marlou Porch, Ms. Rau and her family requested a medication management visit with the pharmacist and this was done today. In addition to their questions about medications, they have grown increasingly concerned with her SOB. The patient and family report that she has increased work of breathing when walking from room to room. She also has started sleeping with the head of her bed raised as if she lays flat she is unable to breath. I advised they make appt to see you or PA soon. They also requested this be sent to you to see if you had any additional recommendations or thought she may need an Echo. Please let me know if I can be of more assistance on this patient. Thank you!! Georgina Peer

## 2017-05-07 NOTE — Telephone Encounter (Signed)
Notified the pt that per Dr Marlou Porch, he reviewed complaints mentioned to our Pharmacist at her last visit with them for med management.  Informed the pt that per Dr Marlou Porch, he recommends that we order for her to have an echo done, to further evaluate her complaints of dyspnea.  Informed the pt that I will place the order in the system, and have a [facility] scheduler call her back, to arrange this appt.  Pt verbalized understanding and agrees with this plan.

## 2017-05-07 NOTE — Telephone Encounter (Signed)
Pts echo is scheduled for 7/16.  Pt made aware of appt date and time by [facility] scheduling.

## 2017-05-12 ENCOUNTER — Other Ambulatory Visit: Payer: Self-pay | Admitting: Cardiology

## 2017-05-18 ENCOUNTER — Other Ambulatory Visit (HOSPITAL_COMMUNITY): Payer: Medicare Other

## 2017-06-09 ENCOUNTER — Ambulatory Visit (INDEPENDENT_AMBULATORY_CARE_PROVIDER_SITE_OTHER): Payer: Medicare Other | Admitting: Pharmacist

## 2017-06-09 DIAGNOSIS — Z5181 Encounter for therapeutic drug level monitoring: Secondary | ICD-10-CM | POA: Diagnosis not present

## 2017-06-09 DIAGNOSIS — I2510 Atherosclerotic heart disease of native coronary artery without angina pectoris: Secondary | ICD-10-CM | POA: Diagnosis not present

## 2017-06-09 DIAGNOSIS — I4891 Unspecified atrial fibrillation: Secondary | ICD-10-CM

## 2017-06-09 LAB — POCT INR: INR: 2.2

## 2017-06-12 DIAGNOSIS — H26493 Other secondary cataract, bilateral: Secondary | ICD-10-CM | POA: Diagnosis not present

## 2017-06-12 DIAGNOSIS — H524 Presbyopia: Secondary | ICD-10-CM | POA: Diagnosis not present

## 2017-06-12 DIAGNOSIS — Z1231 Encounter for screening mammogram for malignant neoplasm of breast: Secondary | ICD-10-CM | POA: Diagnosis not present

## 2017-06-21 ENCOUNTER — Other Ambulatory Visit: Payer: Self-pay | Admitting: Cardiology

## 2017-06-30 ENCOUNTER — Encounter: Payer: Self-pay | Admitting: *Deleted

## 2017-07-15 ENCOUNTER — Telehealth: Payer: Self-pay | Admitting: Cardiology

## 2017-07-15 NOTE — Telephone Encounter (Signed)
Son was calling back to scheduled the 2 D Echo pt had to cancel in July.  He would like someone to call him to schedule that.  Orders are in EPIC.

## 2017-07-15 NOTE — Telephone Encounter (Signed)
Gerald Stabs( Son) is calling to find out if Dr. Marlou Porch want his mom to have a echo . Please call

## 2017-07-20 ENCOUNTER — Ambulatory Visit: Payer: Medicare Other | Admitting: Nurse Practitioner

## 2017-07-22 ENCOUNTER — Other Ambulatory Visit: Payer: Self-pay | Admitting: Cardiology

## 2017-07-29 ENCOUNTER — Ambulatory Visit (INDEPENDENT_AMBULATORY_CARE_PROVIDER_SITE_OTHER): Payer: Medicare Other | Admitting: Nurse Practitioner

## 2017-07-29 ENCOUNTER — Encounter: Payer: Self-pay | Admitting: Nurse Practitioner

## 2017-07-29 ENCOUNTER — Ambulatory Visit (INDEPENDENT_AMBULATORY_CARE_PROVIDER_SITE_OTHER): Payer: Medicare Other | Admitting: *Deleted

## 2017-07-29 VITALS — BP 128/60 | HR 73 | Ht 67.0 in | Wt 144.0 lb

## 2017-07-29 DIAGNOSIS — I482 Chronic atrial fibrillation: Secondary | ICD-10-CM | POA: Diagnosis not present

## 2017-07-29 DIAGNOSIS — I5032 Chronic diastolic (congestive) heart failure: Secondary | ICD-10-CM | POA: Diagnosis not present

## 2017-07-29 DIAGNOSIS — E7800 Pure hypercholesterolemia, unspecified: Secondary | ICD-10-CM

## 2017-07-29 DIAGNOSIS — I4821 Permanent atrial fibrillation: Secondary | ICD-10-CM

## 2017-07-29 DIAGNOSIS — I2510 Atherosclerotic heart disease of native coronary artery without angina pectoris: Secondary | ICD-10-CM

## 2017-07-29 DIAGNOSIS — I4891 Unspecified atrial fibrillation: Secondary | ICD-10-CM | POA: Diagnosis not present

## 2017-07-29 DIAGNOSIS — Z5181 Encounter for therapeutic drug level monitoring: Secondary | ICD-10-CM | POA: Diagnosis not present

## 2017-07-29 DIAGNOSIS — Z79899 Other long term (current) drug therapy: Secondary | ICD-10-CM | POA: Diagnosis not present

## 2017-07-29 LAB — POCT INR: INR: 1.8

## 2017-07-29 NOTE — Patient Instructions (Addendum)
We will be checking the following labs today - BMET and CBC  INR today   Medication Instructions:    Continue with your current medicines.     Testing/Procedures To Be Arranged:  Needs echocardiogram rescheduled for Dr. Marlou Porch  Follow-Up:   See Dr. Marlou Porch in March    Other Special Instructions:   N/A    If you need a refill on your cardiac medications before your next appointment, please call your pharmacy.   Call the [name] office at [phone] if you have any questions, problems or concerns.

## 2017-07-29 NOTE — Progress Notes (Signed)
CARDIOLOGY OFFICE NOTE  Date:  07/29/2017    Natasha Livingston Date of Birth: [birth year] Medical Record #[id number]  PCP:  Cyndi Bender, PA-C  Cardiologist:  Marisa Cyphers    Chief Complaint  Patient presents with  . Atrial Fibrillation    Follow up visit - seen for Dr. Marlou Porch    History of Present Illness: Natasha Livingston is a 83 y.o. female who presents today for a follow up visit. Seen for Dr. Marlou Porch. Former patient of Dr. Sherryl Barters.   She has a history of chronic atrial fibrillation, HTN, CAD with remote CABG in 2008 by Dr. Darcey Nora. Other issues include diastolic HF, chronic fatigue, MR due to MVP, hypothyroidism, and HTN. Echocardiogram on 03/24/14 showed an ejection fraction of 50% which was an improvement from the previous echo of 40%. It was felt that some of this improvement was secondary to improvement in tachycardia mediated cardiomyopathy. She had a negative Myoview stress test on 10/23/14 showing no ischemia and her ejection fraction on that study was 54%.   Last seen by Dr. Marlou Porch back in March - felt to be doing ok. AF was controlled. Seemed more limited by gout. She has multiple caregivers to assist with her care. Discussed changing to Eliquis - but she has elected to stay with coumadin.   Comes in today. Here with her son Natasha Livingston today. Doing ok "for the most part". She has been weaned off Xanax and now only uses prn. She was told her carotids "looked big" - wondering about needing doppler study. Getting coumadin checked today. Has had some stomach upset today - but ate sausage gravy yesterday and notes this did not agree with her. She is short of breath but thinks it might actually be a little better. No bleeding/bruising and no falls noted. Seems to be having more issues with neuropathy than anything at this time.   Note that family called back in the summer regarding her dyspnea - echo was to be obtained - this had to be cancelled - son could not bring  her. They would like to have rescheduled.   Past Medical History:  Diagnosis Date  . Aortic stenosis   . Atrial fibrillation ([place])   . Back pain   . Bruises easily   . Chest discomfort   . Diverticulitis   . Dizziness   . DOE (dyspnea on exertion)   . Forgetfulness   . Hx of cardiovascular stress test    Lexiscan Myoview (12/15):  Apical lateral and anterolateral fixed defect, no ischemia, EF 54%; low risk  . Hyperlipidemia   . Hypertension   . Hypothyroidism   . Interstitial nephritis   . Ischemic heart disease   . MVP (mitral valve prolapse)   . Neuropathy     Past Surgical History:  Procedure Laterality Date  . CARDIOVERSION  08/2008  . CORONARY ARTERY BYPASS GRAFT  04/2007  . OVARIAN CYST REMOVAL       Medications: Current Meds  Medication Sig  . allopurinol (ZYLOPRIM) 100 MG tablet Take 300 mg by mouth daily.   Marland Kitchen ALPRAZolam (XANAX) 0.25 MG tablet Take 1/2 tablet twice daily as needed for anxiety  . colchicine 0.6 MG tablet Take 0.6 mg by mouth daily. As need for gout flares  . furosemide (LASIX) 40 MG tablet Take 1 1/2 tablets by mouth 2 times daily.  Marland Kitchen gabapentin (NEURONTIN) 800 MG tablet Take 0.5 tablets (400 mg total) by mouth every morning. Take 1 tablet by  mouth in the evenings  . hydrocortisone (ANUSOL-HC) 2.5 % rectal cream Place 1 application rectally 2 (two) times daily as needed for hemorrhoids.  Marland Kitchen KLOR-CON M20 20 MEQ tablet TAKE 2 TABLETS BY MOUTH EVERY DAY  . levothyroxine (SYNTHROID, LEVOTHROID) 25 MCG tablet TAKE 1 TABLET BY MOUTH EVERY DAY  . lisinopril (PRINIVIL,ZESTRIL) 10 MG tablet TAKE 1 TABLET BY MOUTH EVERY DAY  . metoprolol succinate (TOPROL-XL) 25 MG 24 hr tablet TAKE 1 TABLET BY MOUTH 2 (TWO) TIMES DAILY.  . nitroGLYCERIN (NITROSTAT) 0.4 MG SL tablet Place 1 tablet (0.4 mg total) under the tongue every 5 (five) minutes as needed for chest pain.  . pantoprazole (PROTONIX) 40 MG tablet Take 40 mg by mouth daily as needed (indigestion).  .  warfarin (COUMADIN) 5 MG tablet TAKE AS DIRECTED BY COUMADIN CLINIC     Allergies: Allergies  Allergen Reactions  . Amiodarone Other (See Comments)    unknown  . Crestor [Rosuvastatin Calcium] Other (See Comments)    unknown  . Lipitor [Atorvastatin Calcium] Swelling    Social History: The patient  reports that she has never smoked. She has never used smokeless tobacco. She reports that she does not drink alcohol or use drugs.   Family History: The patient's family history includes Cerebral aneurysm in her father and unknown relative; Hypertension in her father.   Review of Systems: Please see the history of present illness.   Otherwise, the review of systems is positive for none.   All other systems are reviewed and negative.   Physical Exam: VS:  BP 128/60 (BP Location: Left Arm, Patient Position: Sitting, Cuff Size: Normal)   Pulse 73   Ht 5\' 7"  (1.702 m)   Wt 144 lb (65.3 kg)   SpO2 97% Comment: at rest  BMI 22.55 kg/m  .  BMI Body mass index is 22.55 kg/m.  Wt Readings from Last 3 Encounters:  07/29/17 144 lb (65.3 kg)  01/14/17 150 lb (68 kg)  09/09/16 149 lb 12.8 oz (67.9 kg)    General: Pleasant. Elderly female. She is alert and in no acute distress.   HEENT: Normal. Her speech is chronically hesitating in nature.   Neck: Supple, no JVD, no carotid bruits, or masses noted.  Cardiac: Irregular irregular rhythm. Rate is fine. No murmurs, rubs, or gallops. No edema.  Respiratory:  Lungs are clear to auscultation bilaterally with normal work of breathing.  GI: Soft and nontender.  MS: No deformity or atrophy. Gait and ROM intact. She is using a cane.  Skin: Warm and dry. Color is normal.  Neuro:  Strength and sensation are intact and no gross focal deficits noted.  Psych: Alert, appropriate and with normal affect.   LABORATORY DATA:  EKG:  EKG is not ordered today.  Lab Results  Component Value Date   WBC 4.4 02/01/2016   HGB 15.4 (H) 02/01/2016   HCT  44.0 02/01/2016   PLT 161 02/01/2016   GLUCOSE 98 02/12/2016   CHOL 160 02/01/2016   TRIG 185 (H) 02/01/2016   HDL 29 (L) 02/01/2016   LDLCALC 94 02/01/2016   ALT 16 02/01/2016   AST 29 02/01/2016   NA 137 02/12/2016   K 3.9 02/12/2016   CL 99 02/12/2016   CREATININE 0.92 02/12/2016   BUN 16 02/12/2016   CO2 25 02/12/2016   TSH 1.80 10/10/2014   INR 2.2 06/09/2017   HGBA1C (H) 04/08/2007    6.9 (NOTE)   The ADA recommends the following therapeutic  goals for glycemic   control related to Hgb A1C measurement:   Goal of Therapy:   < 7.0% Hgb A1C   Action Suggested:  > 8.0% Hgb A1C   Ref:  Diabetes Care, 22, Suppl. 1, 1999      BNP (last 3 results) No results for input(s): BNP in the last 8760 hours.  ProBNP (last 3 results) No results for input(s): PROBNP in the last 8760 hours.   Other Studies Reviewed Today:  Echo Study Conclusions from 03/2014  - Left ventricle: The cavity size was normal. The estimated ejection fraction was 50%. There is hypokinesis of the mid-apicalanterolateral myocardium. There is hypokinesis of the mid-apicalinferolateral myocardium. - Mitral valve: Mild calcification, with mild involvement of chords. There was mild to moderate regurgitation. - Left atrium: The atrium was moderately dilated. - Right ventricle: The cavity size was mildly dilated. Wall thickness was normal. - Right atrium: The atrium was moderately to severely dilated. - Tricuspid valve: There was moderate regurgitation. - Pulmonary arteries: PA peak pressure: 52 mm Hg (S).  Impressions:  - The right ventricular systolic pressure was increased consistent with moderate pulmonary hypertension.   Myoview Overall Impression from 10/2014: Low risk stress nuclear study. There is a small fixed defect of moderate intensity involving apical lateral and mid anterolateral myocardium. There is no significant reversibility. SDS 1  LV Ejection Fraction: 54%. LV Wall  Motion: NL LV Function; NL Wall Motion   Darlin Coco MD  Assessment/Plan:  1. Chronic diastolic CHF - she looks compensated. Getting her echo updated based on prior recommendation. I have left her on her current regimen.   2. Chronic/persistent atrial fibrillation: managed with rate control and anticoagulation - checking INR today. Needs follow up surveillance labs as well.   3. CAD with remote CABG - no chest pain reported. Would favor conservative management.   4. HLD - intolerant of statins - has not wished to retry.   5. Cardiomyopathy: EF normal on last nuclear study. Continue beta blocker, ACEI. Echo being updated.   6. Essential hypertension: Controlled.   7. Secondary pulmonary hypertension-estimated pressures in the 50 range, likely secondary to previously underlying heart disease. Nonsmoker. Likely playing a role as well in her shortness of breath.   Current medicines are reviewed with the patient today.  The patient does not have concerns regarding medicines other than what has been noted above.  The following changes have been made:  See above.  Labs/ tests ordered today include:   No orders of the defined types were placed in this encounter.    Disposition:   I think despite all of her issues, she is doing quite well. FU with Dr. Marlou Porch in 6 months.    Patient is agreeable to this plan and will call if any problems develop in the interim.   SignedTruitt Merle, NP  07/29/2017 3:39 PM  [facility] [address] [place],   [address] Phone: [phone] Fax: [phone]

## 2017-07-30 LAB — CBC
Hematocrit: 42.2 % (ref 34.0–46.6)
Hemoglobin: 14.2 g/dL (ref 11.1–15.9)
MCH: 32.5 pg (ref 26.6–33.0)
MCHC: 33.6 g/dL (ref 31.5–35.7)
MCV: 97 fL (ref 79–97)
Platelets: 203 10*3/uL (ref 150–379)
RBC: 4.37 x10E6/uL (ref 3.77–5.28)
RDW: 16.1 % — ABNORMAL HIGH (ref 12.3–15.4)
WBC: 6.3 10*3/uL (ref 3.4–10.8)

## 2017-07-30 LAB — BASIC METABOLIC PANEL
BUN/Creatinine Ratio: 13 (ref 12–28)
BUN: 14 mg/dL (ref 8–27)
CO2: 26 mmol/L (ref 20–29)
Calcium: 10.1 mg/dL (ref 8.7–10.3)
Chloride: 99 mmol/L (ref 96–106)
Creatinine, Ser: 1.04 mg/dL — ABNORMAL HIGH (ref 0.57–1.00)
GFR calc Af Amer: 58 mL/min/{1.73_m2} — ABNORMAL LOW (ref >59)
GFR calc non Af Amer: 51 mL/min/{1.73_m2} — ABNORMAL LOW (ref >59)
Glucose: 95 mg/dL (ref 65–99)
Potassium: 4.1 mmol/L (ref 3.5–5.2)
Sodium: 142 mmol/L (ref 134–144)

## 2017-08-11 DIAGNOSIS — J309 Allergic rhinitis, unspecified: Secondary | ICD-10-CM | POA: Diagnosis not present

## 2017-08-11 DIAGNOSIS — I10 Essential (primary) hypertension: Secondary | ICD-10-CM | POA: Diagnosis not present

## 2017-08-11 DIAGNOSIS — Z23 Encounter for immunization: Secondary | ICD-10-CM | POA: Diagnosis not present

## 2017-08-11 DIAGNOSIS — M109 Gout, unspecified: Secondary | ICD-10-CM | POA: Diagnosis not present

## 2017-08-11 DIAGNOSIS — G629 Polyneuropathy, unspecified: Secondary | ICD-10-CM | POA: Diagnosis not present

## 2017-08-11 DIAGNOSIS — M8580 Other specified disorders of bone density and structure, unspecified site: Secondary | ICD-10-CM | POA: Diagnosis not present

## 2017-08-11 DIAGNOSIS — Z6823 Body mass index (BMI) 23.0-23.9, adult: Secondary | ICD-10-CM | POA: Diagnosis not present

## 2017-08-11 DIAGNOSIS — Z79899 Other long term (current) drug therapy: Secondary | ICD-10-CM | POA: Diagnosis not present

## 2017-08-11 DIAGNOSIS — I4891 Unspecified atrial fibrillation: Secondary | ICD-10-CM | POA: Diagnosis not present

## 2017-08-11 DIAGNOSIS — E039 Hypothyroidism, unspecified: Secondary | ICD-10-CM | POA: Diagnosis not present

## 2017-08-17 ENCOUNTER — Other Ambulatory Visit: Payer: Self-pay | Admitting: Nurse Practitioner

## 2017-08-17 DIAGNOSIS — R748 Abnormal levels of other serum enzymes: Secondary | ICD-10-CM

## 2017-08-18 ENCOUNTER — Ambulatory Visit (INDEPENDENT_AMBULATORY_CARE_PROVIDER_SITE_OTHER): Payer: Medicare Other | Admitting: *Deleted

## 2017-08-18 ENCOUNTER — Ambulatory Visit (HOSPITAL_COMMUNITY): Payer: Medicare Other | Attending: Cardiovascular Disease

## 2017-08-18 ENCOUNTER — Other Ambulatory Visit: Payer: Self-pay

## 2017-08-18 DIAGNOSIS — I4891 Unspecified atrial fibrillation: Secondary | ICD-10-CM

## 2017-08-18 DIAGNOSIS — I340 Nonrheumatic mitral (valve) insufficiency: Secondary | ICD-10-CM | POA: Diagnosis not present

## 2017-08-18 DIAGNOSIS — I341 Nonrheumatic mitral (valve) prolapse: Secondary | ICD-10-CM

## 2017-08-18 DIAGNOSIS — I480 Paroxysmal atrial fibrillation: Secondary | ICD-10-CM

## 2017-08-18 DIAGNOSIS — Z5181 Encounter for therapeutic drug level monitoring: Secondary | ICD-10-CM

## 2017-08-18 DIAGNOSIS — R0609 Other forms of dyspnea: Secondary | ICD-10-CM | POA: Insufficient documentation

## 2017-08-18 LAB — POCT INR: INR: 2.1

## 2017-08-20 ENCOUNTER — Telehealth: Payer: Self-pay | Admitting: Cardiology

## 2017-08-20 NOTE — Telephone Encounter (Signed)
New message     Pt son is returning call about echo results

## 2017-08-20 NOTE — Telephone Encounter (Signed)
Notes recorded by Shellia Cleverly, RN on 08/20/2017 at 3:05 PM EDT Reviewed results with son (on [place]) Pt has been scheduled for OV 10/26 to discuss possible cardiac cath for further eval. ------

## 2017-08-28 ENCOUNTER — Encounter: Payer: Self-pay | Admitting: Cardiology

## 2017-08-28 ENCOUNTER — Ambulatory Visit (INDEPENDENT_AMBULATORY_CARE_PROVIDER_SITE_OTHER): Payer: Medicare Other | Admitting: Cardiology

## 2017-08-28 VITALS — BP 120/62 | HR 84 | Ht 67.0 in | Wt 139.8 lb

## 2017-08-28 DIAGNOSIS — I2510 Atherosclerotic heart disease of native coronary artery without angina pectoris: Secondary | ICD-10-CM

## 2017-08-28 DIAGNOSIS — E7800 Pure hypercholesterolemia, unspecified: Secondary | ICD-10-CM | POA: Diagnosis not present

## 2017-08-28 DIAGNOSIS — I482 Chronic atrial fibrillation: Secondary | ICD-10-CM | POA: Diagnosis not present

## 2017-08-28 DIAGNOSIS — I4821 Permanent atrial fibrillation: Secondary | ICD-10-CM

## 2017-08-28 NOTE — Patient Instructions (Signed)
Medication Instructions:  The current medical regimen is effective;  continue present plan and medications.  Testing/Procedures: Your physician has requested that you have an echocardiogram in 6 months before seeing Dr Marlou Porch. Echocardiography is a painless test that uses sound waves to create images of your heart. It provides your doctor with information about the size and shape of your heart and how well your heart's chambers and valves are working. This procedure takes approximately one hour. There are no restrictions for this procedure.  Follow-Up: Follow up in 6 months with Dr. Marlou Porch.  You will receive a letter in the mail 2 months before you are due.  Please call us when you receive this letter to schedule your follow up appointment.  If you need a refill on your cardiac medications before your next appointment, please call your pharmacy.  Thank you for choosing [facility]!!

## 2017-08-28 NOTE — Progress Notes (Signed)
Cardiology Office Note    Date:  08/28/2017   ID:  Natasha Livingston, Natasha Livingston 18-Jun-1934, MRN [id number]  PCP:  Natasha Bender, PA-C  Cardiologist:   Natasha Furbish, MD     History of Present Illness:  Natasha Livingston is a 83 y.o. female with history of CABG/CAD in June 2008,  permanent atrial fibrillation, hypertension, systolic/diastolic heart failure, chronic fatigue, mitral valve prolapse, hypothyroidism. Overall she is feeling quite well. No significant changes in her symptoms, only mild shortness of breath when moving across the house for instance, no chest pain, no orthopnea. No bleeding episodes, minor bruising noted.  Recently however she has had an echocardiogram which did demonstrate reduction in her ejection fraction down to 25-30% from 50% previously.  Stress test and echocardiogram both reassuring in 2015. She does have secondary pulmonary hypertension on echocardiogram, nonsmoker.  Atrial fibrillation has been well controlled.  Her son is present with her on this visit.    Past Medical History:  Diagnosis Date  . Aortic stenosis   . Atrial fibrillation ([place])   . Back pain   . Bruises easily   . Chest discomfort   . Diverticulitis   . Dizziness   . DOE (dyspnea on exertion)   . Forgetfulness   . Hx of cardiovascular stress test    Lexiscan Myoview (12/15):  Apical lateral and anterolateral fixed defect, no ischemia, EF 54%; low risk  . Hyperlipidemia   . Hypertension   . Hypothyroidism   . Interstitial nephritis   . Ischemic heart disease   . MVP (mitral valve prolapse)   . Neuropathy     Past Surgical History:  Procedure Laterality Date  . CARDIOVERSION  08/2008  . CORONARY ARTERY BYPASS GRAFT  04/2007  . OVARIAN CYST REMOVAL      Current Medications: Outpatient Medications Prior to Visit  Medication Sig Dispense Refill  . allopurinol (ZYLOPRIM) 100 MG tablet Take 300 mg by mouth daily.     Marland Kitchen ALPRAZolam (XANAX) 0.25 MG tablet Take 1/2 tablet twice  daily as needed for anxiety 90 tablet 1  . colchicine 0.6 MG tablet Take 0.6 mg by mouth daily. As need for gout flares    . furosemide (LASIX) 40 MG tablet Take 1 1/2 tablets by mouth 2 times daily. 90 tablet 8  . gabapentin (NEURONTIN) 800 MG tablet Take 0.5 tablets (400 mg total) by mouth every morning. Take 1 tablet by mouth in the evenings 30 tablet 0  . hydrocortisone (ANUSOL-HC) 2.5 % rectal cream Place 1 application rectally 2 (two) times daily as needed for hemorrhoids.    Marland Kitchen KLOR-CON M20 20 MEQ tablet TAKE 2 TABLETS BY MOUTH EVERY DAY 120 tablet 3  . levothyroxine (SYNTHROID, LEVOTHROID) 25 MCG tablet TAKE 1 TABLET BY MOUTH EVERY DAY 30 tablet 9  . lisinopril (PRINIVIL,ZESTRIL) 10 MG tablet TAKE 1 TABLET BY MOUTH EVERY DAY 90 tablet 2  . metoprolol succinate (TOPROL-XL) 25 MG 24 hr tablet TAKE 1 TABLET BY MOUTH 2 (TWO) TIMES DAILY. 180 tablet 3  . nitroGLYCERIN (NITROSTAT) 0.4 MG SL tablet Place 1 tablet (0.4 mg total) under the tongue every 5 (five) minutes as needed for chest pain. 25 tablet 1  . warfarin (COUMADIN) 5 MG tablet TAKE AS DIRECTED BY COUMADIN CLINIC 90 tablet 1  . pantoprazole (PROTONIX) 40 MG tablet Take 40 mg by mouth daily as needed (indigestion).     No facility-administered medications prior to visit.      Allergies:  Amiodarone; Crestor [rosuvastatin calcium]; and Lipitor [atorvastatin calcium]   Social History   Social History  . Marital status: Married    Spouse name: N/A  . Number of children: N/A  . Years of education: N/A   Social History Main Topics  . Smoking status: Never Smoker  . Smokeless tobacco: Never Used  . Alcohol use No  . Drug use: No  . Sexual activity: No   Other Topics Concern  . None   Social History Narrative  . None     Family History:  The patient's family history includes Cerebral aneurysm in her father and unknown relative; Hypertension in her father.   ROS:   Please see the history of present illness.   No syncope  bleeding orthopnea PND.  Mild shortness of breath noted. ROS All other systems reviewed and are negative.   PHYSICAL EXAM:   VS:  BP 120/62   Pulse 84   Ht 5\' 7"  (1.702 m)   Wt 139 lb 12.8 oz (63.4 kg)   SpO2 95%   BMI 21.90 kg/m    GEN: Well nourished, well developed, in no acute distress ambulates with cane HEENT: normal  Neck: no JVD, carotid bruits, or masses Cardiac: Irregularly irregular; no murmurs, rubs, or gallops,no edema  Respiratory:  clear to auscultation bilaterally, normal work of breathing GI: soft, nontender, nondistended, + BS MS: no deformity or atrophy  Skin: warm and dry, no rash Neuro:  Alert and Oriented x 3, Strength and sensation are intact Psych: euthymic mood, full affect   Wt Readings from Last 3 Encounters:  08/28/17 139 lb 12.8 oz (63.4 kg)  07/29/17 144 lb (65.3 kg)  01/14/17 150 lb (68 kg)      Studies/Labs Reviewed:   EKG:  EKG is ordered today. 01/14/17-atrial fibrillation heart rate 80 with LVH repolarization like abnormalities personally viewed-prior 05/16/16-atrial fibrillation with repolarization abnormality, no change from prior EKG personally reviewed.   Recent Labs: 07/29/2017: BUN 14; Creatinine, Ser 1.04; Hemoglobin 14.2; Platelets 203; Potassium 4.1; Sodium 142   Lipid Panel    Component Value Date/Time   CHOL 160 02/01/2016 1224   TRIG 185 (H) 02/01/2016 1224   HDL 29 (L) 02/01/2016 1224   CHOLHDL 5.5 (H) 02/01/2016 1224   VLDL 37 (H) 02/01/2016 1224   LDLCALC 94 02/01/2016 1224    Additional studies/ records that were reviewed today include:  Nuclear stress test 10/2014-low risk, no ischemia, EF 54%.  Chest x-ray-09/21/15-no active disease  Echocardiogram 03/24/14:  - Left ventricle: The cavity size was normal. The estimated ejection fraction was 50%. There is hypokinesis of the mid-apicalanterolateral myocardium. There is hypokinesis of the mid-apicalinferolateral myocardium. - Mitral valve: Mild  calcification, with mild involvement of chords. There was mild to moderate regurgitation. - Left atrium: The atrium was moderately dilated. - Right ventricle: The cavity size was mildly dilated. Wall thickness was normal. - Right atrium: The atrium was moderately to severely dilated. - Tricuspid valve: There was moderate regurgitation. - Pulmonary arteries: PA peak pressure: 52 mm Hg (S).  Impressions:  - The right ventricular systolic pressure was increased consistent with moderate pulmonary hypertension.  08/18/17:  - Left ventricle: The cavity size was normal. Wall thickness was   normal. Systolic function was severely reduced. The estimated   ejection fraction was in the range of 25% to 30%. - Aortic valve: There was trivial regurgitation. - Mitral valve: Mildly calcified annulus. Prolapse, involving the   anterior leaflet. There was mild regurgitation. -  Left atrium: The atrium was moderately to severely dilated. - Right ventricle: The cavity size was severely dilated. Systolic   function was mildly reduced. - Right atrium: The atrium was moderately to severely dilated. - Tricuspid valve: There was moderate-severe regurgitation. - Pulmonary arteries: Systolic pressure was mildly increased. PA   peak pressure: 35 mm Hg (S).  ASSESSMENT:    1. Permanent atrial fibrillation ([place])   2. Pure hypercholesterolemia      PLAN:  In order of problems listed above:  Chronic systolic/diastolic heart failure-No evidence of fluid overload, she is quite comfortable. Mild shortness of breath with activity, moving through the house for instance, no orthopnea, no PND. No hospitalizations.  Encourage continue movement but being careful with her cane.  -We discussed potential diagnostic options such as cardiac catheterization and we mutually decided to continue with medical therapy because she is feeling well and to check an ultrasound again, echocardiogram in 6 months to see if there  is been any improvement in her ejection fraction.  Her atrial fibrillation is well rate controlled.  She is not having any anginal symptoms.  Another possibility would be to perform a nuclear stress test to see if there is any large perfusion defects which may push [place] towards cardiac catheterization.  At this point, she desires not to have any invasive management.  If things change, we will alter our management strategy.  -Continue with both Toprol as well as lisinopril.  She is taking Lasix as well.  Secondary pulmonary hypertension-estimated pressures in the 50 range, likely secondary to previously underlying heart disease, elevated left atrial filling pressures. Nonsmoker. Continue to treat the underlying heart disease.  No changes  Permanent atrial fibrillation-well rate controlled. She is on Toprol 25 mg twice daily. No changes made.  Chronic anticoagulation-stable, no bleeding, minor bruising, forearm. She takes warfarin. CHADS-VASc 4 (Age>75, Female, HTN, CAD). I discussed with she and her son today the possibility of transitioning over to Eliquis. They will think about this. [facility]. She has been fairly stable with her INR management.   CAD/CABG-doing well, conservative management. No anginal symptoms.  She has had reduction in her ejection fraction.  Discussed options as above.  Hyperlipidemia-intolerance of statins. She does not wish to retry.  Hypertension, controlled-overall doing well.  72-month follow-up, 74-month echocardiogram.  Medication Adjustments/Labs and Tests Ordered: Current medicines are reviewed at length with the patient today.  Concerns regarding medicines are outlined above.  Medication changes, Labs and Tests ordered today are listed in the Patient Instructions below. Patient Instructions  Medication Instructions:  The current medical regimen is effective;  continue present plan and medications.  Testing/Procedures: Your  physician has requested that you have an echocardiogram in 6 months before seeing Dr Marlou Porch. Echocardiography is a painless test that uses sound waves to create images of your heart. It provides your doctor with information about the size and shape of your heart and how well your heart's chambers and valves are working. This procedure takes approximately one hour. There are no restrictions for this procedure.  Follow-Up: Follow up in 6 months with Dr. Marlou Porch.  You will receive a letter in the mail 2 months before you are due.  Please call us when you receive this letter to schedule your follow up appointment.  If you need a refill on your cardiac medications before your next appointment, please call your pharmacy.  Thank you for choosing [facility]!!  Signed, Natasha Furbish, MD  08/28/2017 10:04 AM    [facility] [address], [place], [place]  [address] Phone: [phone]; Fax: [phone]

## 2017-09-01 ENCOUNTER — Ambulatory Visit
Admission: RE | Admit: 2017-09-01 | Discharge: 2017-09-01 | Disposition: A | Discharge status: Home / Self Care | Payer: Medicare Other | Source: Ambulatory Visit | Attending: Nurse Practitioner | Admitting: Nurse Practitioner

## 2017-09-01 DIAGNOSIS — K7689 Other specified diseases of liver: Secondary | ICD-10-CM | POA: Diagnosis not present

## 2017-09-01 DIAGNOSIS — R748 Abnormal levels of other serum enzymes: Secondary | ICD-10-CM

## 2017-09-15 ENCOUNTER — Ambulatory Visit (INDEPENDENT_AMBULATORY_CARE_PROVIDER_SITE_OTHER): Payer: Medicare Other | Admitting: *Deleted

## 2017-09-15 DIAGNOSIS — I480 Paroxysmal atrial fibrillation: Secondary | ICD-10-CM

## 2017-09-15 DIAGNOSIS — I341 Nonrheumatic mitral (valve) prolapse: Secondary | ICD-10-CM | POA: Diagnosis not present

## 2017-09-15 DIAGNOSIS — I4891 Unspecified atrial fibrillation: Secondary | ICD-10-CM

## 2017-09-15 DIAGNOSIS — Z5181 Encounter for therapeutic drug level monitoring: Secondary | ICD-10-CM

## 2017-09-15 LAB — POCT INR: INR: 1.9

## 2017-09-15 NOTE — Patient Instructions (Signed)
Today Nov 13th take 1 tablet then continue taking 1 tablet everyday except 1/2 tablet on Tuesdays, Thursdays, and Saturdays. Recheck INR in 4 weeks. Call our office if you have any questions or unusual bleeding [phone]. Reduce intake of dark leafy greens

## 2017-10-08 ENCOUNTER — Other Ambulatory Visit: Payer: Self-pay | Admitting: Cardiology

## 2017-10-19 ENCOUNTER — Ambulatory Visit (INDEPENDENT_AMBULATORY_CARE_PROVIDER_SITE_OTHER): Payer: Medicare Other | Admitting: *Deleted

## 2017-10-19 DIAGNOSIS — I4891 Unspecified atrial fibrillation: Secondary | ICD-10-CM | POA: Diagnosis not present

## 2017-10-19 DIAGNOSIS — Z5181 Encounter for therapeutic drug level monitoring: Secondary | ICD-10-CM | POA: Diagnosis not present

## 2017-10-19 DIAGNOSIS — I480 Paroxysmal atrial fibrillation: Secondary | ICD-10-CM | POA: Diagnosis not present

## 2017-10-19 LAB — POCT INR: INR: 2

## 2017-10-19 NOTE — Patient Instructions (Signed)
Description   Continue taking 1 tablet everyday except 1/2 tablet on Tuesdays, Thursdays, and Saturdays. Recheck INR in 4 weeks. Call our office if you have any questions or unusual bleeding [phone].

## 2017-11-17 ENCOUNTER — Ambulatory Visit (INDEPENDENT_AMBULATORY_CARE_PROVIDER_SITE_OTHER): Payer: Medicare Other

## 2017-11-17 DIAGNOSIS — Z5181 Encounter for therapeutic drug level monitoring: Secondary | ICD-10-CM | POA: Diagnosis not present

## 2017-11-17 DIAGNOSIS — I482 Chronic atrial fibrillation: Secondary | ICD-10-CM | POA: Diagnosis not present

## 2017-11-17 DIAGNOSIS — I4891 Unspecified atrial fibrillation: Secondary | ICD-10-CM

## 2017-11-17 DIAGNOSIS — I4821 Permanent atrial fibrillation: Secondary | ICD-10-CM

## 2017-11-17 LAB — POCT INR: INR: 2.1

## 2017-11-17 NOTE — Patient Instructions (Signed)
Description   Continue taking 1 tablet everyday except 1/2 tablet on Tuesdays, Thursdays, and Saturdays. Recheck INR in 4 weeks. Call our office if you have any questions or unusual bleeding [phone].

## 2017-12-21 ENCOUNTER — Ambulatory Visit (INDEPENDENT_AMBULATORY_CARE_PROVIDER_SITE_OTHER): Payer: Medicare Other | Admitting: *Deleted

## 2017-12-21 DIAGNOSIS — I480 Paroxysmal atrial fibrillation: Secondary | ICD-10-CM | POA: Diagnosis not present

## 2017-12-21 DIAGNOSIS — I4891 Unspecified atrial fibrillation: Secondary | ICD-10-CM

## 2017-12-21 DIAGNOSIS — Z5181 Encounter for therapeutic drug level monitoring: Secondary | ICD-10-CM

## 2017-12-21 LAB — POCT INR: INR: 1.9

## 2017-12-21 NOTE — Patient Instructions (Signed)
Description   Today Feb 18th take another 1/2 tablet of coumadin as she has already taken 1 tablet today  then continue taking 1 tablet everyday except 1/2 tablet on Tuesdays, Thursdays, and Saturdays. Recheck INR in 4 weeks. Call our office if you have any questions or unusual bleeding [phone].

## 2018-01-19 ENCOUNTER — Ambulatory Visit (INDEPENDENT_AMBULATORY_CARE_PROVIDER_SITE_OTHER): Payer: Medicare Other | Admitting: *Deleted

## 2018-01-19 DIAGNOSIS — I480 Paroxysmal atrial fibrillation: Secondary | ICD-10-CM

## 2018-01-19 DIAGNOSIS — Z5181 Encounter for therapeutic drug level monitoring: Secondary | ICD-10-CM

## 2018-01-19 DIAGNOSIS — I4891 Unspecified atrial fibrillation: Secondary | ICD-10-CM | POA: Diagnosis not present

## 2018-01-19 LAB — POCT INR: INR: 2.3

## 2018-01-19 NOTE — Patient Instructions (Signed)
Description   Continue taking 1 tablet everyday except 1/2 tablet on Tuesdays, Thursdays, and Saturdays. Recheck INR in 4 weeks. Call our office if you have any questions or unusual bleeding [phone].

## 2018-02-05 ENCOUNTER — Other Ambulatory Visit: Payer: Self-pay | Admitting: Cardiology

## 2018-02-09 DIAGNOSIS — E039 Hypothyroidism, unspecified: Secondary | ICD-10-CM | POA: Diagnosis not present

## 2018-02-09 DIAGNOSIS — I4891 Unspecified atrial fibrillation: Secondary | ICD-10-CM | POA: Diagnosis not present

## 2018-02-09 DIAGNOSIS — Z79899 Other long term (current) drug therapy: Secondary | ICD-10-CM | POA: Diagnosis not present

## 2018-02-09 DIAGNOSIS — M109 Gout, unspecified: Secondary | ICD-10-CM | POA: Diagnosis not present

## 2018-02-09 DIAGNOSIS — Z6823 Body mass index (BMI) 23.0-23.9, adult: Secondary | ICD-10-CM | POA: Diagnosis not present

## 2018-02-09 DIAGNOSIS — G629 Polyneuropathy, unspecified: Secondary | ICD-10-CM | POA: Diagnosis not present

## 2018-02-09 DIAGNOSIS — I2510 Atherosclerotic heart disease of native coronary artery without angina pectoris: Secondary | ICD-10-CM | POA: Diagnosis not present

## 2018-02-16 ENCOUNTER — Other Ambulatory Visit: Payer: Self-pay

## 2018-02-16 ENCOUNTER — Other Ambulatory Visit: Payer: Self-pay | Admitting: Cardiology

## 2018-02-16 ENCOUNTER — Ambulatory Visit (HOSPITAL_COMMUNITY): Payer: Medicare Other | Attending: Cardiovascular Disease

## 2018-02-16 ENCOUNTER — Ambulatory Visit (INDEPENDENT_AMBULATORY_CARE_PROVIDER_SITE_OTHER): Payer: Medicare Other | Admitting: *Deleted

## 2018-02-16 DIAGNOSIS — Z5181 Encounter for therapeutic drug level monitoring: Secondary | ICD-10-CM | POA: Diagnosis not present

## 2018-02-16 DIAGNOSIS — E039 Hypothyroidism, unspecified: Secondary | ICD-10-CM | POA: Diagnosis not present

## 2018-02-16 DIAGNOSIS — I10 Essential (primary) hypertension: Secondary | ICD-10-CM | POA: Diagnosis not present

## 2018-02-16 DIAGNOSIS — R42 Dizziness and giddiness: Secondary | ICD-10-CM | POA: Diagnosis not present

## 2018-02-16 DIAGNOSIS — I5042 Chronic combined systolic (congestive) and diastolic (congestive) heart failure: Secondary | ICD-10-CM | POA: Diagnosis not present

## 2018-02-16 DIAGNOSIS — I4891 Unspecified atrial fibrillation: Secondary | ICD-10-CM

## 2018-02-16 DIAGNOSIS — G629 Polyneuropathy, unspecified: Secondary | ICD-10-CM | POA: Insufficient documentation

## 2018-02-16 DIAGNOSIS — I371 Nonrheumatic pulmonary valve insufficiency: Secondary | ICD-10-CM | POA: Diagnosis not present

## 2018-02-16 DIAGNOSIS — I482 Chronic atrial fibrillation: Secondary | ICD-10-CM

## 2018-02-16 DIAGNOSIS — I341 Nonrheumatic mitral (valve) prolapse: Secondary | ICD-10-CM | POA: Insufficient documentation

## 2018-02-16 DIAGNOSIS — I4821 Permanent atrial fibrillation: Secondary | ICD-10-CM

## 2018-02-16 DIAGNOSIS — I081 Rheumatic disorders of both mitral and tricuspid valves: Secondary | ICD-10-CM | POA: Diagnosis not present

## 2018-02-16 DIAGNOSIS — I359 Nonrheumatic aortic valve disorder, unspecified: Secondary | ICD-10-CM

## 2018-02-16 LAB — POCT INR: INR: 2.1

## 2018-02-16 NOTE — Patient Instructions (Signed)
Description   Continue taking 1 tablet everyday except 1/2 tablet on Tuesdays, Thursdays, and Saturdays. Recheck INR in 4 weeks. Call our office if you have any questions or unusual bleeding [phone].

## 2018-02-17 ENCOUNTER — Telehealth: Payer: Self-pay | Admitting: Cardiology

## 2018-02-17 NOTE — Telephone Encounter (Signed)
New message   Patient son, calling for echo results.

## 2018-02-18 NOTE — Telephone Encounter (Signed)
Reviewed results with son (on [place]) who states understanding.  He is interested in her possibly trying Entresto and will discuss at the next office visit.

## 2018-02-21 ENCOUNTER — Other Ambulatory Visit: Payer: Self-pay | Admitting: Cardiology

## 2018-02-27 ENCOUNTER — Other Ambulatory Visit: Payer: Self-pay | Admitting: Cardiology

## 2018-03-15 DIAGNOSIS — S30861A Insect bite (nonvenomous) of abdominal wall, initial encounter: Secondary | ICD-10-CM | POA: Diagnosis not present

## 2018-03-15 DIAGNOSIS — Z6823 Body mass index (BMI) 23.0-23.9, adult: Secondary | ICD-10-CM | POA: Diagnosis not present

## 2018-03-16 ENCOUNTER — Ambulatory Visit (INDEPENDENT_AMBULATORY_CARE_PROVIDER_SITE_OTHER): Payer: Medicare Other

## 2018-03-16 DIAGNOSIS — Z5181 Encounter for therapeutic drug level monitoring: Secondary | ICD-10-CM

## 2018-03-16 DIAGNOSIS — I4891 Unspecified atrial fibrillation: Secondary | ICD-10-CM | POA: Diagnosis not present

## 2018-03-16 LAB — POCT INR: INR: 2.2

## 2018-03-16 NOTE — Patient Instructions (Signed)
Description   Continue on same dosage 1 tablet everyday except 1/2 tablet on Tuesdays, Thursdays, and Saturdays. Recheck INR in 6 weeks. Call our office if you have any questions or unusual bleeding [phone].      

## 2018-03-30 ENCOUNTER — Other Ambulatory Visit: Payer: Self-pay | Admitting: Cardiology

## 2018-04-07 ENCOUNTER — Encounter: Payer: Self-pay | Admitting: Cardiology

## 2018-04-07 ENCOUNTER — Ambulatory Visit (INDEPENDENT_AMBULATORY_CARE_PROVIDER_SITE_OTHER): Payer: Medicare Other | Admitting: Cardiology

## 2018-04-07 VITALS — BP 128/74 | HR 51 | Ht 67.0 in | Wt 143.6 lb

## 2018-04-07 DIAGNOSIS — I5022 Chronic systolic (congestive) heart failure: Secondary | ICD-10-CM

## 2018-04-07 DIAGNOSIS — I481 Persistent atrial fibrillation: Secondary | ICD-10-CM

## 2018-04-07 DIAGNOSIS — I4819 Other persistent atrial fibrillation: Secondary | ICD-10-CM

## 2018-04-07 MED ORDER — SACUBITRIL-VALSARTAN 24-26 MG PO TABS: 1.0000 | ORAL_TABLET | Freq: Two times a day (BID) | ORAL | 11 refills | Status: DC

## 2018-04-07 NOTE — Progress Notes (Signed)
Cardiology Office Note    Date:  04/07/2018   ID:  Natasha, Livingston 1934/10/13, MRN [id number]  PCP:  Cyndi Bender, PA-C  Cardiologist:   Candee Furbish, MD     History of Present Illness:  Natasha Livingston is a 84 y.o. female with history of CABG/CAD in June 2008,  permanent atrial fibrillation, hypertension, systolic/diastolic heart failure, chronic fatigue, mitral valve prolapse, hypothyroidism.Stable mild shortness of breath when moving across the house for instance. No bleeding episodes, minor bruising noted.  Had an echocardiogram which did demonstrate reduction in her ejection fraction down to 25-30% from 50% previously.  Repeat echocardiogram in April 2019 showed mild improvement to 35 to 40%.  Stress test and echocardiogram both reassuring in 2015. She does have secondary pulmonary hypertension on echocardiogram, nonsmoker.  Atrial fibrillation has been well controlled.  Her son is present with her on this visit.   04/07/2018- update, echocardiogram mildly improved to 35 to 40% however not at normal range as she was previously.  She has had bypass so part of this could be ischemic in etiology.  Currently on both ACE inhibitor as well as Toprol.  Minimal symptoms.  No anginal symptoms.  At prior visits, did not wish to proceed with invasive evaluation.   Past Medical History:  Diagnosis Date  . Atrial fibrillation ([place])   . Back pain   . Bruises easily   . Chest discomfort   . Diverticulitis   . Dizziness   . DOE (dyspnea on exertion)   . Forgetfulness   . Hx of cardiovascular stress test    Lexiscan Myoview (12/15):  Apical lateral and anterolateral fixed defect, no ischemia, EF 54%; low risk  . Hyperlipidemia   . Hypertension   . Hypothyroidism   . Interstitial nephritis   . Ischemic heart disease   . MVP (mitral valve prolapse)   . Neuropathy     Past Surgical History:  Procedure Laterality Date  . CARDIOVERSION  08/2008  . CORONARY ARTERY BYPASS GRAFT   04/2007  . OVARIAN CYST REMOVAL      Current Medications: Outpatient Medications Prior to Visit  Medication Sig Dispense Refill  . allopurinol (ZYLOPRIM) 300 MG tablet Take 1 tablet by mouth daily.  3  . ALPRAZolam (XANAX) 0.25 MG tablet Take 1/2 tablet twice daily as needed for anxiety 90 tablet 1  . colchicine 0.6 MG tablet Take 0.6 mg by mouth daily. As need for gout flares    . fexofenadine (ALLEGRA) 60 MG tablet Take 60 mg by mouth 2 (two) times daily as needed for allergies or rhinitis.    . furosemide (LASIX) 40 MG tablet TAKE 1 AND 1/2 TABLETS BY MOUTH 2 TIMES DAILY 90 tablet 4  . gabapentin (NEURONTIN) 800 MG tablet Take 0.5 tablets (400 mg total) by mouth every morning. Take 1 tablet by mouth in the evenings 30 tablet 0  . hydrocortisone (ANUSOL-HC) 2.5 % rectal cream Place 1 application rectally 2 (two) times daily as needed for hemorrhoids.    Marland Kitchen KLOR-CON M20 20 MEQ tablet TAKE 2 TABLETS BY MOUTH EVERY DAY 180 tablet 1  . levothyroxine (SYNTHROID, LEVOTHROID) 25 MCG tablet TAKE 1 TABLET BY MOUTH EVERY DAY 30 tablet 9  . lisinopril (PRINIVIL,ZESTRIL) 10 MG tablet TAKE 1 TABLET BY MOUTH EVERY DAY 90 tablet 1  . metoprolol succinate (TOPROL-XL) 25 MG 24 hr tablet TAKE 1 TABLET BY MOUTH 2 (TWO) TIMES DAILY. 180 tablet 1  . nitroGLYCERIN (NITROSTAT) 0.4 MG SL  tablet Place 1 tablet (0.4 mg total) under the tongue every 5 (five) minutes as needed for chest pain. 25 tablet 1  . warfarin (COUMADIN) 5 MG tablet TAKE AS DIRECTED BY COUMADIN CLINIC 90 tablet 1  . allopurinol (ZYLOPRIM) 100 MG tablet Take 300 mg by mouth daily.      No facility-administered medications prior to visit.      Allergies:   Amiodarone; Crestor [rosuvastatin calcium]; and Lipitor [atorvastatin calcium]   Social History   Socioeconomic History  . Marital status: Married    Spouse name: Not on file  . Number of children: Not on file  . Years of education: Not on file  . Highest education level: Not on file    Occupational History  . Not on file  Social Needs  . Financial resource strain: Not on file  . Food insecurity:    Worry: Not on file    Inability: Not on file  . Transportation needs:    Medical: Not on file    Non-medical: Not on file  Tobacco Use  . Smoking status: Never Smoker  . Smokeless tobacco: Never Used  Substance and Sexual Activity  . Alcohol use: No  . Drug use: No  . Sexual activity: Never  Lifestyle  . Physical activity:    Days per week: Not on file    Minutes per session: Not on file  . Stress: Not on file  Relationships  . Social connections:    Talks on phone: Not on file    Gets together: Not on file    Attends religious service: Not on file    Active member of club or organization: Not on file    Attends meetings of clubs or organizations: Not on file    Relationship status: Not on file  Other Topics Concern  . Not on file  Social History Narrative  . Not on file     Family History:  The patient's family history includes Cerebral aneurysm in her father and unknown relative; Hypertension in her father.   ROS:   Please see the history of present illness.  Review of Systems  All other systems reviewed and are negative.     PHYSICAL EXAM:   VS:  BP 128/74   Pulse (!) 51   Ht 5\' 7"  (1.702 m)   Wt 143 lb 9.6 oz (65.1 kg)   BMI 22.49 kg/m    GEN: Well nourished, well developed, in no acute distress  HEENT: normal  Neck: no JVD, carotid bruits, or masses Cardiac: IRRR; no significant murmur, rubs, or gallops,no edema  Respiratory:  clear to auscultation bilaterally, normal work of breathing GI: soft, nontender, nondistended, + BS MS: no deformity or atrophy  Skin: warm and dry, no rash Neuro:  Alert and Oriented x 3, Strength and sensation are intact Psych: euthymic mood, full affect    Wt Readings from Last 3 Encounters:  04/07/18 143 lb 9.6 oz (65.1 kg)  08/28/17 139 lb 12.8 oz (63.4 kg)  07/29/17 144 lb (65.3 kg)       Studies/Labs Reviewed:   EKG: 04/07/2018-sinus bradycardia first-degree AV block 51 with nonspecific ST-T wave changes, has appearance of incomplete left bundle branch block.  Personally viewed.  01/14/17-atrial fibrillation heart rate 80 with LVH repolarization like abnormalities personally viewed-prior 05/16/16-atrial fibrillation with repolarization abnormality, no change from prior EKG personally reviewed.   Recent Labs: 07/29/2017: BUN 14; Creatinine, Ser 1.04; Hemoglobin 14.2; Platelets 203; Potassium 4.1; Sodium 142  Lipid Panel    Component Value Date/Time   CHOL 160 02/01/2016 1224   TRIG 185 (H) 02/01/2016 1224   HDL 29 (L) 02/01/2016 1224   CHOLHDL 5.5 (H) 02/01/2016 1224   VLDL 37 (H) 02/01/2016 1224   LDLCALC 94 02/01/2016 1224    Additional studies/ records that were reviewed today include:  Nuclear stress test 10/2014-low risk, no ischemia, EF 54%.  Chest x-ray-09/21/15-no active disease  Echocardiogram 03/24/14:  - Left ventricle: The cavity size was normal. The estimated ejection fraction was 50%. There is hypokinesis of the mid-apicalanterolateral myocardium. There is hypokinesis of the mid-apicalinferolateral myocardium. - Mitral valve: Mild calcification, with mild involvement of chords. There was mild to moderate regurgitation. - Left atrium: The atrium was moderately dilated. - Right ventricle: The cavity size was mildly dilated. Wall thickness was normal. - Right atrium: The atrium was moderately to severely dilated. - Tricuspid valve: There was moderate regurgitation. - Pulmonary arteries: PA peak pressure: 52 mm Hg (S).  Impressions:  - The right ventricular systolic pressure was increased consistent with moderate pulmonary hypertension.  08/18/17:  - Left ventricle: The cavity size was normal. Wall thickness was   normal. Systolic function was severely reduced. The estimated   ejection fraction was in the range of 25% to 30%. -  Aortic valve: There was trivial regurgitation. - Mitral valve: Mildly calcified annulus. Prolapse, involving the   anterior leaflet. There was mild regurgitation. - Left atrium: The atrium was moderately to severely dilated. - Right ventricle: The cavity size was severely dilated. Systolic   function was mildly reduced. - Right atrium: The atrium was moderately to severely dilated. - Tricuspid valve: There was moderate-severe regurgitation. - Pulmonary arteries: Systolic pressure was mildly increased. PA   peak pressure: 35 mm Hg (S).  04/07/18: ECHO  - Left ventricle: Inferobasal and posterior lateral hypokinesis   worse. Wall thickness was increased in a pattern of mild LVH.   Systolic function was moderately reduced. The estimated ejection   fraction was in the range of 35% to 40%. Left ventricular   diastolic function parameters were normal. - Aortic valve: There was trivial regurgitation. - Mitral valve: Moderately calcified annulus. Moderately thickened,   moderately calcified leaflets . There was mild regurgitation. - Left atrium: The atrium was severely dilated. - Right ventricle: The cavity size was mildly dilated. - Right atrium: The atrium was severely dilated. - Atrial septum: No defect or patent foramen ovale was identified. - Tricuspid valve: There was severe regurgitation.  ASSESSMENT:    1. Chronic systolic heart failure (HCC)   2. Persistent atrial fibrillation (HCC)      PLAN:  In order of problems listed above:  Chronic systolic/diastolic heart failure, in part ischemic- her ejection fraction remains reduced however has mildly improved from 20 up to 35 to 40% on most recent echocardiogram 02/16/2018. No evidence of fluid overload, she is quite comfortable. Mild shortness of breath with activity, moving through the house for instance, no orthopnea, no PND. No hospitalizations.  Encourage continue movement but being careful with her cane.  -We discussed potential  diagnostic options such as cardiac catheterization and we mutually decided to continue with medical therapy because she is feeling well. At this point, she desires not to have any invasive management.  If things change, we will alter our management strategy.  -Continue with both Toprol as well as lisinopril.  She is taking Lasix as well.  -Discussed the possibility of Entresto and they would like to  try.  We will give low-dose.  Will need preauthorization.  She does get some of her medicines through the [place].  Stop lisinopril for 36 hours prior to start.  Watch for any signs of hypotension.  Secondary pulmonary hypertension-estimated pressures in the 50 range, likely secondary to previously underlying heart disease, elevated left atrial filling pressures. Nonsmoker. Continue to treat the underlying heart disease.  No changes made to therapy  Permanent atrial fibrillation-well rate controlled. She is on Toprol 25 mg twice daily.  Medications once again reviewed, no changes made  Chronic anticoagulation-stable, no bleeding, minor bruising, forearm. She takes warfarin. CHADS-VASc 4 (Age>75, Female, HTN, CAD). I discussed with she and her son today the possibility of transitioning over to Eliquis. They will think about this. She gets her medicines through the [facility]. She has been fairly stable with her INR management.   CAD/CABG-doing well, conservative management. No anginal symptoms.  She has had reduction in her ejection fraction.  Discussed options as above.  Hyperlipidemia-intolerance of statins. She does not wish to retry. No changes  Hypertension, controlled-overall doing well.  1-2 month follow-up with Cecille Rubin  Medication Adjustments/Labs and Tests Ordered: Current medicines are reviewed at length with the patient today.  Concerns regarding medicines are outlined above.  Medication changes, Labs and Tests ordered today are listed in the Patient Instructions below. There are no Patient  Instructions on file for this visit.   Signed, Candee Furbish, MD  04/07/2018 10:28 AM    [facility] [address], [place], [place]  [address] Phone: [phone]; Fax: [phone]

## 2018-04-07 NOTE — Patient Instructions (Addendum)
Medication Instructions:  Your physician has recommended you make the following change in your medication:   STOP Lisinopril - Wait 36 hours then START Entresto 24-26 mg twice daily   Labwork: None Ordered   Testing/Procedures: None Ordered   Follow-Up: Your physician recommends that you schedule a follow-up appointment in: 1-2 months with Truitt Merle, NP   If you need a refill on your cardiac medications before your next appointment, please call your pharmacy.   Thank you for choosing [facility]! Christen Bame, RN [phone]

## 2018-04-15 ENCOUNTER — Telehealth: Payer: Self-pay | Admitting: Cardiology

## 2018-04-15 DIAGNOSIS — Z9181 History of falling: Secondary | ICD-10-CM | POA: Diagnosis not present

## 2018-04-15 DIAGNOSIS — Z1331 Encounter for screening for depression: Secondary | ICD-10-CM | POA: Diagnosis not present

## 2018-04-15 DIAGNOSIS — Z0000 Encounter for general adult medical examination without abnormal findings: Secondary | ICD-10-CM | POA: Diagnosis not present

## 2018-04-15 DIAGNOSIS — Z139 Encounter for screening, unspecified: Secondary | ICD-10-CM | POA: Diagnosis not present

## 2018-04-15 DIAGNOSIS — Z23 Encounter for immunization: Secondary | ICD-10-CM | POA: Diagnosis not present

## 2018-04-15 NOTE — Telephone Encounter (Signed)
Called and spoke with Richardson Landry (pt's oldest son) to verify OK to discuss medications with [facility].  He advised OK.  [facility] and was sent to a VM.  Left message pt was given instructions to d/c Lisinopril at last office visit, to wait 36 hrs and then start Entresto.  Advised to please c/b if further questions. Of note- per Richardson Landry- he does pt's pill box and knows this was carried out correctly.

## 2018-04-15 NOTE — Telephone Encounter (Signed)
Natasha Livingston from [facility] in [place] is calling to find out which medication it was that DR. Skains discontinued.

## 2018-04-27 ENCOUNTER — Telehealth: Payer: Self-pay

## 2018-04-27 ENCOUNTER — Ambulatory Visit (INDEPENDENT_AMBULATORY_CARE_PROVIDER_SITE_OTHER): Payer: Medicare Other

## 2018-04-27 ENCOUNTER — Other Ambulatory Visit: Payer: Self-pay | Admitting: *Deleted

## 2018-04-27 DIAGNOSIS — I4891 Unspecified atrial fibrillation: Secondary | ICD-10-CM | POA: Diagnosis not present

## 2018-04-27 DIAGNOSIS — I4819 Other persistent atrial fibrillation: Secondary | ICD-10-CM

## 2018-04-27 DIAGNOSIS — I481 Persistent atrial fibrillation: Secondary | ICD-10-CM | POA: Diagnosis not present

## 2018-04-27 DIAGNOSIS — Z5181 Encounter for therapeutic drug level monitoring: Secondary | ICD-10-CM | POA: Diagnosis not present

## 2018-04-27 LAB — POCT INR: INR: 2.4 (ref 2.0–3.0)

## 2018-04-27 MED ORDER — SACUBITRIL-VALSARTAN 24-26 MG PO TABS
1.0000 | ORAL_TABLET | Freq: Two times a day (BID) | ORAL | 3 refills | Status: DC
Start: 2018-04-27 — End: 2019-03-01

## 2018-04-27 NOTE — Telephone Encounter (Signed)
Called home number provided and was informed that the patient was not at home. Called other number listed on chart and it was patients son, Steve(DPR on file). I clarified where rx needs to be sent, he states that it needs to be sent [facility]. He is aware that I will send this in via escribe as requested.

## 2018-04-27 NOTE — Telephone Encounter (Signed)
Pt of Dr Marlou Porch seen in [facility] today requested rx for [facility] be sent to [facility], was rx Delene Loll at [facility] with Dr Marlou Porch on 04/07/18.  Rx was sent to [facility], needs rx called to [facility].  Call pt with any questions [phone].  Thanks

## 2018-04-27 NOTE — Patient Instructions (Signed)
Description   Continue on same dosage 1 tablet everyday except 1/2 tablet on Tuesdays, Thursdays, and Saturdays. Recheck INR in 6 weeks. Call our office if you have any questions or unusual bleeding [phone].

## 2018-05-11 DIAGNOSIS — Z6822 Body mass index (BMI) 22.0-22.9, adult: Secondary | ICD-10-CM | POA: Diagnosis not present

## 2018-05-11 DIAGNOSIS — M5431 Sciatica, right side: Secondary | ICD-10-CM | POA: Diagnosis not present

## 2018-05-23 ENCOUNTER — Other Ambulatory Visit: Payer: Self-pay | Admitting: Cardiology

## 2018-05-26 ENCOUNTER — Ambulatory Visit (INDEPENDENT_AMBULATORY_CARE_PROVIDER_SITE_OTHER): Payer: Medicare Other | Admitting: *Deleted

## 2018-05-26 DIAGNOSIS — Z5181 Encounter for therapeutic drug level monitoring: Secondary | ICD-10-CM | POA: Diagnosis not present

## 2018-05-26 DIAGNOSIS — I4891 Unspecified atrial fibrillation: Secondary | ICD-10-CM

## 2018-05-26 DIAGNOSIS — M545 Low back pain: Secondary | ICD-10-CM | POA: Diagnosis not present

## 2018-05-26 DIAGNOSIS — I481 Persistent atrial fibrillation: Secondary | ICD-10-CM | POA: Diagnosis not present

## 2018-05-26 DIAGNOSIS — M25551 Pain in right hip: Secondary | ICD-10-CM | POA: Diagnosis not present

## 2018-05-26 DIAGNOSIS — I4819 Other persistent atrial fibrillation: Secondary | ICD-10-CM

## 2018-05-26 LAB — POCT INR: INR: 2.3 (ref 2.0–3.0)

## 2018-05-26 NOTE — Patient Instructions (Signed)
Description   Continue on same dosage 1 tablet everyday except 1/2 tablet on Tuesdays, Thursdays, and Saturdays. Recheck INR in 6 weeks. Call our office if you have any questions or unusual bleeding [phone].

## 2018-06-09 DIAGNOSIS — M25551 Pain in right hip: Secondary | ICD-10-CM | POA: Diagnosis not present

## 2018-06-14 ENCOUNTER — Ambulatory Visit: Payer: Medicare Other | Admitting: Nurse Practitioner

## 2018-06-15 ENCOUNTER — Encounter: Payer: Self-pay | Admitting: Nurse Practitioner

## 2018-06-15 ENCOUNTER — Ambulatory Visit (INDEPENDENT_AMBULATORY_CARE_PROVIDER_SITE_OTHER): Payer: Medicare Other | Admitting: Nurse Practitioner

## 2018-06-15 VITALS — BP 120/80 | HR 104 | Ht 67.0 in | Wt 143.4 lb

## 2018-06-15 DIAGNOSIS — Z79899 Other long term (current) drug therapy: Secondary | ICD-10-CM | POA: Diagnosis not present

## 2018-06-15 DIAGNOSIS — I5022 Chronic systolic (congestive) heart failure: Secondary | ICD-10-CM | POA: Diagnosis not present

## 2018-06-15 DIAGNOSIS — I481 Persistent atrial fibrillation: Secondary | ICD-10-CM | POA: Diagnosis not present

## 2018-06-15 DIAGNOSIS — I4819 Other persistent atrial fibrillation: Secondary | ICD-10-CM

## 2018-06-15 MED ORDER — METOPROLOL SUCCINATE ER 50 MG PO TB24: 50.0000 mg | ORAL_TABLET | Freq: Two times a day (BID) | ORAL | 3 refills | Status: DC

## 2018-06-15 NOTE — Progress Notes (Signed)
CARDIOLOGY OFFICE NOTE  Date:  06/15/2018    Natasha Livingston Date of Birth: [birth year] Medical Record #[id number]  PCP:  Cyndi Bender, PA-C  Cardiologist:  Marisa Cyphers    Chief Complaint  Patient presents with  . Congestive Heart Failure  . Cardiomyopathy    2 month check - seen for Dr. Marlou Porch    History of Present Illness: Natasha Livingston is a 84 y.o. female who presents today for a 2 month check. Seen for Dr. Marlou Porch. Former patient of Dr. Sherryl Barters.   She has a history of chronic atrial fibrillation, HTN, CAD with remote CABG in 2008 by Dr. Darcey Nora. Other issues include diastolic HF, chronic fatigue, MR due to MVP, hypothyroidism, and HTN. Echocardiogram on 03/24/14 showed an ejection fraction of 50% which was an improvement from the previous echo of 40%. It was felt that some of this improvement was secondary to improvement in tachycardia mediated cardiomyopathy. She had a negative Myoview stress test on 10/23/14 showing no ischemia and her ejection fraction on that study was 54%.   I saw her back in September of 2018 - she was doing ok - had weaned off Xanax. Neuropathy seemed to be her most limiting factor. She had missed her visit for an echo - family wished to proceed and thus scheduled.   She then saw Dr. Marlou Porch in October after the echo - this had shown her EF to be lower - they opted for continued medical therapy with no further testing other than a repeat echo in 6 months. The repeat echo done in April had improved. Son wanted to try her on Delene Loll - this was started.   Comes in today. Here with her son and grandson. She has been treated with some shots in her hip for her arthritis/sciatica and a short course of Prednisone - finished about 12 days ago. Her feet are cold - she is wondering if it is from the shot. Her feet typically do not feel cold but hot. This has now resolved. She still has her neuropathy. No chest pain. Breathing is ok. No falls.  Tolerating her medicines without issue. Seems to be compliant with her medicines. No awareness of her heart beating fast today.   Past Medical History:  Diagnosis Date  . Atrial fibrillation ([place])   . Back pain   . Bruises easily   . Chest discomfort   . Diverticulitis   . Dizziness   . DOE (dyspnea on exertion)   . Forgetfulness   . Hx of cardiovascular stress test    Lexiscan Myoview (12/15):  Apical lateral and anterolateral fixed defect, no ischemia, EF 54%; low risk  . Hyperlipidemia   . Hypertension   . Hypothyroidism   . Interstitial nephritis   . Ischemic heart disease   . MVP (mitral valve prolapse)   . Neuropathy     Past Surgical History:  Procedure Laterality Date  . CARDIOVERSION  08/2008  . CORONARY ARTERY BYPASS GRAFT  04/2007  . OVARIAN CYST REMOVAL       Medications: Current Meds  Medication Sig  . allopurinol (ZYLOPRIM) 300 MG tablet Take 1 tablet by mouth daily.  Marland Kitchen ALPRAZolam (XANAX) 0.25 MG tablet Take 1/2 tablet twice daily as needed for anxiety  . colchicine 0.6 MG tablet Take 0.6 mg by mouth daily. As need for gout flares  . fexofenadine (ALLEGRA) 60 MG tablet Take 60 mg by mouth 2 (two) times daily as needed for allergies or  rhinitis.  . furosemide (LASIX) 40 MG tablet TAKE 1 AND 1/2 TABLETS BY MOUTH 2 TIMES DAILY  . gabapentin (NEURONTIN) 800 MG tablet Take 0.5 tablets (400 mg total) by mouth every morning. Take 1 tablet by mouth in the evenings  . hydrocortisone (ANUSOL-HC) 2.5 % rectal cream Place 1 application rectally 2 (two) times daily as needed for hemorrhoids.  Marland Kitchen KLOR-CON M20 20 MEQ tablet TAKE 2 TABLETS BY MOUTH EVERY DAY  . levothyroxine (SYNTHROID, LEVOTHROID) 25 MCG tablet TAKE 1 TABLET BY MOUTH EVERY DAY  . nitroGLYCERIN (NITROSTAT) 0.4 MG SL tablet Place 1 tablet (0.4 mg total) under the tongue every 5 (five) minutes as needed for chest pain.  . sacubitril-valsartan (ENTRESTO) 24-26 MG Take 1 tablet by mouth 2 (two) times daily.  Marland Kitchen  warfarin (COUMADIN) 5 MG tablet TAKE AS DIRECTED BY COUMADIN CLINIC  . [DISCONTINUED] metoprolol succinate (TOPROL-XL) 25 MG 24 hr tablet TAKE 1 TABLET BY MOUTH 2 (TWO) TIMES DAILY.     Allergies: Allergies  Allergen Reactions  . Amiodarone Other (See Comments)    unknown  . Crestor [Rosuvastatin Calcium] Other (See Comments)    unknown  . Lipitor [Atorvastatin Calcium] Swelling    Social History: The patient  reports that she has never smoked. She has never used smokeless tobacco. She reports that she does not drink alcohol or use drugs.   Family History: The patient's family history includes Cerebral aneurysm in her father and unknown relative; Hypertension in her father.   Review of Systems: Please see the history of present illness.   Otherwise, the review of systems is positive for none.   All other systems are reviewed and negative.   Physical Exam: VS:  BP 120/80 (BP Location: Left Arm, Patient Position: Sitting, Cuff Size: Normal)   Pulse (!) 104   Ht 5\' 7"  (1.702 m)   Wt 143 lb 6.4 oz (65 kg)   SpO2 97% Comment: at rest  BMI 22.46 kg/m  .  BMI Body mass index is 22.46 kg/m.  Wt Readings from Last 3 Encounters:  06/15/18 143 lb 6.4 oz (65 kg)  04/07/18 143 lb 9.6 oz (65.1 kg)  08/28/17 139 lb 12.8 oz (63.4 kg)    General:  Elderly female. Alert and in no acute distress.   HEENT: Normal.  Neck: Supple, no JVD, carotid bruits, or masses noted.  Cardiac: Irregular rhythm - her rate is fast - 96 by my count. No edema. Her feet are warm to touch.  Respiratory:  Lungs are clear to auscultation bilaterally with normal work of breathing.  GI: Soft and nontender.  MS: No deformity or atrophy. Gait and ROM intact. She is using a cane.  Skin: Warm and dry. Color is normal.  Neuro:  Strength and sensation are intact and no gross focal deficits noted.  Psych: Alert, appropriate and with normal affect.   LABORATORY DATA:  EKG:  EKG is ordered today. This shows 2:1  atrial flutter - HR is 105 - reviewed with Dr. Curt Bears.   Lab Results  Component Value Date   WBC 6.3 07/29/2017   HGB 14.2 07/29/2017   HCT 42.2 07/29/2017   PLT 203 07/29/2017   GLUCOSE 95 07/29/2017   CHOL 160 02/01/2016   TRIG 185 (H) 02/01/2016   HDL 29 (L) 02/01/2016   LDLCALC 94 02/01/2016   ALT 16 02/01/2016   AST 29 02/01/2016   NA 142 07/29/2017   K 4.1 07/29/2017   CL 99 07/29/2017  CREATININE 1.04 (H) 07/29/2017   BUN 14 07/29/2017   CO2 26 07/29/2017   TSH 1.80 10/10/2014   INR 2.3 05/26/2018   HGBA1C (H) 04/08/2007    6.9 (NOTE)   The ADA recommends the following therapeutic goals for glycemic   control related to Hgb A1C measurement:   Goal of Therapy:   < 7.0% Hgb A1C   Action Suggested:  > 8.0% Hgb A1C   Ref:  Diabetes Care, 22, Suppl. 1, 1999       BNP (last 3 results) No results for input(s): BNP in the last 8760 hours.  ProBNP (last 3 results) No results for input(s): PROBNP in the last 8760 hours.   Other Studies Reviewed Today:  Echo Study Conclusions 02/2018  - Left ventricle: Inferobasal and posterior lateral hypokinesis   worse. Wall thickness was increased in a pattern of mild LVH.   Systolic function was moderately reduced. The estimated ejection   fraction was in the range of 35% to 40%. Left ventricular   diastolic function parameters were normal. - Aortic valve: There was trivial regurgitation. - Mitral valve: Moderately calcified annulus. Moderately thickened,   moderately calcified leaflets . There was mild regurgitation. - Left atrium: The atrium was severely dilated. - Right ventricle: The cavity size was mildly dilated. - Right atrium: The atrium was severely dilated. - Atrial septum: No defect or patent foramen ovale was identified. - Tricuspid valve: There was severe regurgitation.  Notes recorded by Jerline Pain, MD on 02/17/2018 at 3:50 PM EDT EF mildly improved when compared to prior of 20%. Continue with medical  therapy. Previously she was not very interested in pursuing invasive cath.  Candee Furbish, MD    Echo Study Conclusions 08/2017  - Left ventricle: The cavity size was normal. Wall thickness was   normal. Systolic function was severely reduced. The estimated   ejection fraction was in the range of 25% to 30%. - Aortic valve: There was trivial regurgitation. - Mitral valve: Mildly calcified annulus. Prolapse, involving the   anterior leaflet. There was mild regurgitation. - Left atrium: The atrium was moderately to severely dilated. - Right ventricle: The cavity size was severely dilated. Systolic   function was mildly reduced. - Right atrium: The atrium was moderately to severely dilated. - Tricuspid valve: There was moderate-severe regurgitation. - Pulmonary arteries: Systolic pressure was mildly increased. PA   peak pressure: 35 mm Hg (S).   Notes recorded by Jerline Pain, MD on 08/20/2017 at 7:09 AM EDT EF is reduced, 25-30%. This is a marked changed from prior evaluation. Currently on Toprol and lisinopril, coumadin. It would not be unreasonable to discuss heart cath with her to see if changes have occurred with the CABG that could explain the drop in EF. Please have her come in to discuss. Candee Furbish, MD    Assessment/Plan:  1. Elevated HR - she is in 2:1 atrial flutter today - not symptomatic but does not need to let this persist, especially with her reduced EF - discussed with Dr. Curt Bears - will increase Toprol to 50 mg BID. Hopefully BP will support. We will check lab as well. ?recent prednisone was the trigger??  2. Chronic systolic & diastolic CHF- most recent echo noted. Now on Entresto - lab today.  She has declined further testing and wishes for medical management.   3. Chronic/persistentatrial fibrillation: see #1. She remains on anticoagulation.   4. CAD with remote CABG - no chest pain reported. Would favor conservative  management.   5. HLD- intolerant  of statins - has not wished to retry.   6. Essential hypertension: BP ok on current regimen.   7. Secondary pulmonary hypertension-estimated pressures in the 50 range, likely secondary to previously underlying heart disease. Nonsmoker. Likely playing a role as well in her shortness of breath. Not discussed today.   Current medicines are reviewed with the patient today.  The patient does not have concerns regarding medicines other than what has been noted above.  The following changes have been made:  See above.  Labs/ tests ordered today include:    Orders Placed This Encounter  Procedures  . Basic metabolic panel  . CBC  . EKG 12-Lead     Disposition:   FU with Dr. Marlou Porch in about 2 weeks with repeat EKG  Patient is agreeable to this plan and will call if any problems develop in the interim.   SignedTruitt Merle, NP  06/15/2018 4:23 PM  [place] [address] [place],   [address] Phone: [phone] Fax: [phone]

## 2018-06-15 NOTE — Patient Instructions (Addendum)
We will be checking the following labs today - BMET & CBC   Medication Instructions:    Continue with your current medicines. BUT  I am increasing the Toprol to 50 mg twice a day - this has been sent to your pharmacy    Testing/Procedures To Be Arranged:  N/A  Follow-Up:   See Dr. Marlou Porch in about 2 weeks with repeat EKG    Other Special Instructions:   N/A    If you need a refill on your cardiac medications before your next appointment, please call your pharmacy.   Call the [place] office at [phone] if you have any questions, problems or concerns.

## 2018-06-16 LAB — BASIC METABOLIC PANEL
BUN/Creatinine Ratio: 20 (ref 12–28)
BUN: 17 mg/dL (ref 8–27)
CO2: 27 mmol/L (ref 20–29)
Calcium: 9.8 mg/dL (ref 8.7–10.3)
Chloride: 99 mmol/L (ref 96–106)
Creatinine, Ser: 0.87 mg/dL (ref 0.57–1.00)
GFR calc Af Amer: 72 mL/min/{1.73_m2} (ref >59)
GFR calc non Af Amer: 62 mL/min/{1.73_m2} (ref >59)
Glucose: 118 mg/dL — ABNORMAL HIGH (ref 65–99)
Potassium: 4.6 mmol/L (ref 3.5–5.2)
Sodium: 142 mmol/L (ref 134–144)

## 2018-06-16 LAB — CBC
Hematocrit: 39.6 % (ref 34.0–46.6)
Hemoglobin: 13.5 g/dL (ref 11.1–15.9)
MCH: 32.8 pg (ref 26.6–33.0)
MCHC: 34.1 g/dL (ref 31.5–35.7)
MCV: 96 fL (ref 79–97)
Platelets: 216 10*3/uL (ref 150–450)
RBC: 4.12 x10E6/uL (ref 3.77–5.28)
RDW: 16.1 % — ABNORMAL HIGH (ref 12.3–15.4)
WBC: 7.4 10*3/uL (ref 3.4–10.8)

## 2018-06-29 ENCOUNTER — Encounter: Payer: Self-pay | Admitting: Cardiology

## 2018-06-29 ENCOUNTER — Ambulatory Visit (INDEPENDENT_AMBULATORY_CARE_PROVIDER_SITE_OTHER): Payer: Medicare Other | Admitting: Cardiology

## 2018-06-29 VITALS — BP 116/80 | HR 71 | Ht 67.0 in | Wt 143.6 lb

## 2018-06-29 DIAGNOSIS — E7800 Pure hypercholesterolemia, unspecified: Secondary | ICD-10-CM

## 2018-06-29 DIAGNOSIS — I5022 Chronic systolic (congestive) heart failure: Secondary | ICD-10-CM | POA: Diagnosis not present

## 2018-06-29 DIAGNOSIS — I481 Persistent atrial fibrillation: Secondary | ICD-10-CM | POA: Diagnosis not present

## 2018-06-29 DIAGNOSIS — I4819 Other persistent atrial fibrillation: Secondary | ICD-10-CM

## 2018-06-29 NOTE — Patient Instructions (Signed)
Medication Instructions:  The current medical regimen is effective;  continue present plan and medications.   Follow-Up: Follow up in 4 months with Dr. Skains.  You will receive a letter in the mail 2 months before you are due.  Please call us when you receive this letter to schedule your follow up appointment.   If you need a refill on your cardiac medications before your next appointment, please call your pharmacy.  Thank you for choosing [facility]!!      

## 2018-06-29 NOTE — Progress Notes (Signed)
Cardiology Office Note    Date:  06/29/2018   ID:  [name], [name] 1934-06-13, MRN [id number]  PCP:  Cyndi Bender, PA-C  Cardiologist:   Candee Furbish, MD     History of Present Illness:  Natasha Livingston is a 84 y.o. female with history of CABG/CAD in June 2008,  permanent atrial fibrillation, hypertension, systolic/diastolic heart failure, chronic fatigue, mitral valve prolapse, hypothyroidism.Stable mild shortness of breath when moving across the house for instance. No bleeding episodes, minor bruising noted.  Had an echocardiogram which did demonstrate reduction in her ejection fraction down to 25-30% from 50% previously.  Repeat echocardiogram in April 2019 showed mild improvement to 35 to 40%.  Stress test and echocardiogram both reassuring in 2015. She does have secondary pulmonary hypertension on echocardiogram, nonsmoker.  Atrial fibrillation has been well controlled.  Her son is present with her on this visit.   04/07/2018- update, echocardiogram mildly improved to 35 to 40% however not at normal range as she was previously.  She has had bypass so part of this could be ischemic in etiology.  Currently on both ACE inhibitor as well as Toprol.  Minimal symptoms.  No anginal symptoms.  At prior visits, did not wish to proceed with invasive evaluation.  06/29/2018-she seems to be doing quite well with Entresto.  Blood pressures are stable.  Creatinine is 0.87.  Excellent.  She still having some trouble with neuropathy, feet stay cold, she likes to use warming pad.   Past Medical History:  Diagnosis Date  . Atrial fibrillation ([place])   . Back pain   . Bruises easily   . Chest discomfort   . Diverticulitis   . Dizziness   . DOE (dyspnea on exertion)   . Forgetfulness   . Hx of cardiovascular stress test    Lexiscan Myoview (12/15):  Apical lateral and anterolateral fixed defect, no ischemia, EF 54%; low risk  . Hyperlipidemia   . Hypertension   . Hypothyroidism   .  Interstitial nephritis   . Ischemic heart disease   . MVP (mitral valve prolapse)   . Neuropathy     Past Surgical History:  Procedure Laterality Date  . CARDIOVERSION  08/2008  . CORONARY ARTERY BYPASS GRAFT  04/2007  . OVARIAN CYST REMOVAL      Current Medications: Outpatient Medications Prior to Visit  Medication Sig Dispense Refill  . allopurinol (ZYLOPRIM) 300 MG tablet Take 1 tablet by mouth daily.  3  . ALPRAZolam (XANAX) 0.25 MG tablet Take 1/2 tablet twice daily as needed for anxiety 90 tablet 1  . colchicine 0.6 MG tablet Take 0.6 mg by mouth daily. As need for gout flares    . fexofenadine (ALLEGRA) 60 MG tablet Take 60 mg by mouth 2 (two) times daily as needed for allergies or rhinitis.    . furosemide (LASIX) 40 MG tablet TAKE 1 AND 1/2 TABLETS BY MOUTH 2 TIMES DAILY 270 tablet 3  . gabapentin (NEURONTIN) 800 MG tablet Take 0.5 tablets (400 mg total) by mouth every morning. Take 1 tablet by mouth in the evenings 30 tablet 0  . hydrocortisone (ANUSOL-HC) 2.5 % rectal cream Place 1 application rectally 2 (two) times daily as needed for hemorrhoids.    Marland Kitchen KLOR-CON M20 20 MEQ tablet TAKE 2 TABLETS BY MOUTH EVERY DAY 180 tablet 1  . levothyroxine (SYNTHROID, LEVOTHROID) 25 MCG tablet TAKE 1 TABLET BY MOUTH EVERY DAY 30 tablet 9  . metoprolol succinate (TOPROL-XL) 50 MG 24  hr tablet Take 1 tablet (50 mg total) by mouth 2 (two) times daily. Take with or immediately following a meal. 180 tablet 3  . nitroGLYCERIN (NITROSTAT) 0.4 MG SL tablet Place 1 tablet (0.4 mg total) under the tongue every 5 (five) minutes as needed for chest pain. 25 tablet 1  . sacubitril-valsartan (ENTRESTO) 24-26 MG Take 1 tablet by mouth 2 (two) times daily. 180 tablet 3  . warfarin (COUMADIN) 5 MG tablet TAKE AS DIRECTED BY COUMADIN CLINIC 90 tablet 1   No facility-administered medications prior to visit.      Allergies:   Amiodarone; Crestor [rosuvastatin calcium]; and Lipitor [atorvastatin calcium]    Social History   Socioeconomic History  . Marital status: Married    Spouse name: Not on file  . Number of children: Not on file  . Years of education: Not on file  . Highest education level: Not on file  Occupational History  . Not on file  Social Needs  . Financial resource strain: Not on file  . Food insecurity:    Worry: Not on file    Inability: Not on file  . Transportation needs:    Medical: Not on file    Non-medical: Not on file  Tobacco Use  . Smoking status: Never Smoker  . Smokeless tobacco: Never Used  Substance and Sexual Activity  . Alcohol use: No  . Drug use: No  . Sexual activity: Never  Lifestyle  . Physical activity:    Days per week: Not on file    Minutes per session: Not on file  . Stress: Not on file  Relationships  . Social connections:    Talks on phone: Not on file    Gets together: Not on file    Attends religious service: Not on file    Active member of club or organization: Not on file    Attends meetings of clubs or organizations: Not on file    Relationship status: Not on file  Other Topics Concern  . Not on file  Social History Narrative  . Not on file     Family History:  The patient's family history includes Cerebral aneurysm in her father and unknown relative; Hypertension in her father.   ROS:   Please see the history of present illness.  Review of Systems  All other systems reviewed and are negative.     PHYSICAL EXAM:   VS:  BP 116/80   Pulse 71   Ht 5\' 7"  (1.702 m)   Wt 143 lb 9.6 oz (65.1 kg)   BMI 22.49 kg/m    GEN: Well nourished, well developed, in no acute distress  HEENT: normal  Neck: no JVD, carotid bruits, or masses Cardiac: Irreg ; no murmurs, rubs, or gallops,no edema  Respiratory:  clear to auscultation bilaterally, normal work of breathing GI: soft, nontender, nondistended, + BS MS: no deformity or atrophy  Skin: warm and dry, no rash Neuro:  Alert and Oriented x 3, Strength and sensation are  intact Psych: euthymic mood, full affect     Wt Readings from Last 3 Encounters:  06/29/18 143 lb 9.6 oz (65.1 kg)  06/15/18 143 lb 6.4 oz (65 kg)  04/07/18 143 lb 9.6 oz (65.1 kg)      Studies/Labs Reviewed:   EKG: 04/07/2018-sinus bradycardia first-degree AV block 51 with nonspecific ST-T wave changes, has appearance of incomplete left bundle branch block.  Personally viewed.  01/14/17-atrial fibrillation heart rate 80 with LVH repolarization like abnormalities  personally viewed-prior 05/16/16-atrial fibrillation with repolarization abnormality, no change from prior EKG personally reviewed.   Recent Labs: 06/15/2018: BUN 17; Creatinine, Ser 0.87; Hemoglobin 13.5; Platelets 216; Potassium 4.6; Sodium 142   Lipid Panel    Component Value Date/Time   CHOL 160 02/01/2016 1224   TRIG 185 (H) 02/01/2016 1224   HDL 29 (L) 02/01/2016 1224   CHOLHDL 5.5 (H) 02/01/2016 1224   VLDL 37 (H) 02/01/2016 1224   LDLCALC 94 02/01/2016 1224    Additional studies/ records that were reviewed today include:  Nuclear stress test 10/2014-low risk, no ischemia, EF 54%.  Chest x-ray-09/21/15-no active disease  Echocardiogram 03/24/14:  - Left ventricle: The cavity size was normal. The estimated ejection fraction was 50%. There is hypokinesis of the mid-apicalanterolateral myocardium. There is hypokinesis of the mid-apicalinferolateral myocardium. - Mitral valve: Mild calcification, with mild involvement of chords. There was mild to moderate regurgitation. - Left atrium: The atrium was moderately dilated. - Right ventricle: The cavity size was mildly dilated. Wall thickness was normal. - Right atrium: The atrium was moderately to severely dilated. - Tricuspid valve: There was moderate regurgitation. - Pulmonary arteries: PA peak pressure: 52 mm Hg (S).  Impressions:  - The right ventricular systolic pressure was increased consistent with moderate pulmonary  hypertension.  08/18/17:  - Left ventricle: The cavity size was normal. Wall thickness was   normal. Systolic function was severely reduced. The estimated   ejection fraction was in the range of 25% to 30%. - Aortic valve: There was trivial regurgitation. - Mitral valve: Mildly calcified annulus. Prolapse, involving the   anterior leaflet. There was mild regurgitation. - Left atrium: The atrium was moderately to severely dilated. - Right ventricle: The cavity size was severely dilated. Systolic   function was mildly reduced. - Right atrium: The atrium was moderately to severely dilated. - Tricuspid valve: There was moderate-severe regurgitation. - Pulmonary arteries: Systolic pressure was mildly increased. PA   peak pressure: 35 mm Hg (S).  04/07/18: ECHO  - Left ventricle: Inferobasal and posterior lateral hypokinesis   worse. Wall thickness was increased in a pattern of mild LVH.   Systolic function was moderately reduced. The estimated ejection   fraction was in the range of 35% to 40%. Left ventricular   diastolic function parameters were normal. - Aortic valve: There was trivial regurgitation. - Mitral valve: Moderately calcified annulus. Moderately thickened,   moderately calcified leaflets . There was mild regurgitation. - Left atrium: The atrium was severely dilated. - Right ventricle: The cavity size was mildly dilated. - Right atrium: The atrium was severely dilated. - Atrial septum: No defect or patent foramen ovale was identified. - Tricuspid valve: There was severe regurgitation.  ASSESSMENT:    1. Persistent atrial fibrillation ([place])   2. Pure hypercholesterolemia   3. Chronic systolic heart failure ([facility])      PLAN:  In order of problems listed above:  Chronic systolic/diastolic heart failure, in part ischemic- her ejection fraction remains reduced however has mildly improved from 20 up to 35 to 40% on most recent echocardiogram 02/16/2018. No evidence of  fluid overload, she is quite comfortable. Mild shortness of breath with activity, moving through the house for instance, no orthopnea, no PND. No hospitalizations.  Encourage continue movement but being careful with her cane.  -We discussed potential diagnostic options such as cardiac catheterization and we mutually decided to continue with medical therapy because she is feeling well. At this point, she desires not to have any  invasive management.  If things change, we will alter our management strategy.  -Continue with both Toprol as well as lisinopril.  She is taking Lasix as well.  -Discussed the possibility of Entresto and they would like to try.  We will give low-dose.  Will need preauthorization.  She does get some of her medicines through the [place].  Stop lisinopril for 36 hours prior to start.  Watch for any signs of hypotension.  Secondary pulmonary hypertension-estimated pressures in the 50 range, likely secondary to previously underlying heart disease, elevated left atrial filling pressures. Nonsmoker. Continue to treat the underlying heart disease.  No changes made to therapy  Permanent atrial fibrillation-well rate controlled. She is on Toprol 25 mg twice daily.  Medications once again reviewed, no changes made  Chronic anticoagulation-stable, no bleeding, minor bruising, forearm. She takes warfarin. CHADS-VASc 4 (Age>75, Female, HTN, CAD). I discussed with she and her son today the possibility of transitioning over to Eliquis. They will think about this. She [facility] medicines [facility]. She has been fairly stable with her INR management. No changes  CAD/CABG-doing well, conservative management. No anginal symptoms.  She has had reduction in her ejection fraction.  Discussed options as above. Still doing well.   Hyperlipidemia-intolerance of statins. She does not wish to retry. No changes. Understands increased risk  Hypertension, controlled-overall doing well.  4 month  follow-up - will consider repeat ECHO then.   Medication Adjustments/Labs and Tests Ordered: Current medicines are reviewed at length with the patient today.  Concerns regarding medicines are outlined above.  Medication changes, Labs and Tests ordered today are listed in the Patient Instructions below. Patient Instructions  Medication Instructions:  The current medical regimen is effective;  continue present plan and medications.  Follow-Up: Follow up in 4 months with Dr. Marlou Porch.  You will receive a letter in the mail 2 months before you are due.  Please call us when you receive this letter to schedule your follow up appointment.  If you need a refill on your cardiac medications before your next appointment, please call your pharmacy.  Thank you for choosing [facility]!!        Signed, Candee Furbish, MD  06/29/2018 5:38 PM    [place] [address] [place], [place]  [address] Phone: [phone]; Fax: [phone]

## 2018-07-07 ENCOUNTER — Ambulatory Visit (INDEPENDENT_AMBULATORY_CARE_PROVIDER_SITE_OTHER): Payer: Medicare Other | Admitting: *Deleted

## 2018-07-07 DIAGNOSIS — Z5181 Encounter for therapeutic drug level monitoring: Secondary | ICD-10-CM

## 2018-07-07 DIAGNOSIS — I4891 Unspecified atrial fibrillation: Secondary | ICD-10-CM

## 2018-07-07 LAB — POCT INR: INR: 2.8 (ref 2.0–3.0)

## 2018-07-07 NOTE — Patient Instructions (Signed)
Description   Continue on same dosage 1 tablet everyday except 1/2 tablet on Tuesdays, Thursdays, and Saturdays. Recheck INR in 6 weeks. Call our office if you have any questions or unusual bleeding [phone].

## 2018-07-23 DIAGNOSIS — M25551 Pain in right hip: Secondary | ICD-10-CM | POA: Diagnosis not present

## 2018-07-26 DIAGNOSIS — M545 Low back pain: Secondary | ICD-10-CM | POA: Diagnosis not present

## 2018-07-27 ENCOUNTER — Telehealth: Payer: Self-pay | Admitting: Cardiology

## 2018-07-27 NOTE — Telephone Encounter (Signed)
[name] (on [place]) called to request that I look back and see if his mom only had Bypass surgery back in 2008... She had an MRI and he was asked if she also had a stent then but according to as far back as I can see on one of Dr,. Brackbill's notes that she only had Bypass 2008 and cardioversion in 2009. I advised him that I do not have full access to her records that far back and if he still has questions that he may need to contact medical records and see if they can pull her reports that far back.

## 2018-07-27 NOTE — Telephone Encounter (Signed)
New Message         Patient's son is calling today would like to know if his mom had stents put in or was it a triple bypass back in  2009, pls  Call to confirm.

## 2018-07-29 DIAGNOSIS — Z23 Encounter for immunization: Secondary | ICD-10-CM | POA: Diagnosis not present

## 2018-08-03 ENCOUNTER — Other Ambulatory Visit: Payer: Self-pay | Admitting: Cardiology

## 2018-08-11 DIAGNOSIS — M545 Low back pain: Secondary | ICD-10-CM | POA: Diagnosis not present

## 2018-08-11 DIAGNOSIS — M7601 Gluteal tendinitis, right hip: Secondary | ICD-10-CM | POA: Diagnosis not present

## 2018-08-11 DIAGNOSIS — M25551 Pain in right hip: Secondary | ICD-10-CM | POA: Diagnosis not present

## 2018-08-13 DIAGNOSIS — H04123 Dry eye syndrome of bilateral lacrimal glands: Secondary | ICD-10-CM | POA: Diagnosis not present

## 2018-08-13 DIAGNOSIS — H5213 Myopia, bilateral: Secondary | ICD-10-CM | POA: Diagnosis not present

## 2018-08-13 DIAGNOSIS — H5315 Visual distortions of shape and size: Secondary | ICD-10-CM | POA: Diagnosis not present

## 2018-08-13 DIAGNOSIS — H26493 Other secondary cataract, bilateral: Secondary | ICD-10-CM | POA: Diagnosis not present

## 2018-08-17 DIAGNOSIS — M25651 Stiffness of right hip, not elsewhere classified: Secondary | ICD-10-CM | POA: Diagnosis not present

## 2018-08-17 DIAGNOSIS — M6281 Muscle weakness (generalized): Secondary | ICD-10-CM | POA: Diagnosis not present

## 2018-08-17 DIAGNOSIS — R262 Difficulty in walking, not elsewhere classified: Secondary | ICD-10-CM | POA: Diagnosis not present

## 2018-08-17 DIAGNOSIS — M545 Low back pain: Secondary | ICD-10-CM | POA: Diagnosis not present

## 2018-08-18 ENCOUNTER — Ambulatory Visit (INDEPENDENT_AMBULATORY_CARE_PROVIDER_SITE_OTHER): Payer: Medicare Other | Admitting: *Deleted

## 2018-08-18 DIAGNOSIS — I4891 Unspecified atrial fibrillation: Secondary | ICD-10-CM | POA: Diagnosis not present

## 2018-08-18 DIAGNOSIS — Z5181 Encounter for therapeutic drug level monitoring: Secondary | ICD-10-CM

## 2018-08-18 LAB — POCT INR: INR: 3.6 — AB (ref 2.0–3.0)

## 2018-08-18 NOTE — Patient Instructions (Signed)
Description   Do not take any Coumadin tomorrow, then continue on same dosage 1 tablet everyday except 1/2 tablet on Tuesdays, Thursdays, and Saturdays. Recheck INR in 3 weeks. Call our office if you have any questions or unusual bleeding [phone].

## 2018-08-20 ENCOUNTER — Other Ambulatory Visit: Payer: Self-pay | Admitting: Cardiology

## 2018-08-20 DIAGNOSIS — R262 Difficulty in walking, not elsewhere classified: Secondary | ICD-10-CM | POA: Diagnosis not present

## 2018-08-20 DIAGNOSIS — M25651 Stiffness of right hip, not elsewhere classified: Secondary | ICD-10-CM | POA: Diagnosis not present

## 2018-08-20 DIAGNOSIS — M545 Low back pain: Secondary | ICD-10-CM | POA: Diagnosis not present

## 2018-08-20 DIAGNOSIS — M6281 Muscle weakness (generalized): Secondary | ICD-10-CM | POA: Diagnosis not present

## 2018-08-24 DIAGNOSIS — M6281 Muscle weakness (generalized): Secondary | ICD-10-CM | POA: Diagnosis not present

## 2018-08-24 DIAGNOSIS — R262 Difficulty in walking, not elsewhere classified: Secondary | ICD-10-CM | POA: Diagnosis not present

## 2018-08-24 DIAGNOSIS — M545 Low back pain: Secondary | ICD-10-CM | POA: Diagnosis not present

## 2018-08-24 DIAGNOSIS — M25651 Stiffness of right hip, not elsewhere classified: Secondary | ICD-10-CM | POA: Diagnosis not present

## 2018-08-26 ENCOUNTER — Telehealth: Payer: Self-pay | Admitting: Cardiology

## 2018-08-26 DIAGNOSIS — H26491 Other secondary cataract, right eye: Secondary | ICD-10-CM | POA: Diagnosis not present

## 2018-08-26 NOTE — Telephone Encounter (Signed)
° ° ° °  Pt c/o BP issue: STAT if pt c/o blurred vision, one-sided weakness or slurred speech  1. What are your last 5 BP readings? 112/74, 110/72, 90/50  2. Are you having any other symptoms (ex. Dizziness, headache, blurred vision, passed out)? tired  3. What is your BP issue? Son is calling, states patient BP has been dropping too low, could it be from use of eye drops after cataract surg. Please call

## 2018-08-26 NOTE — Telephone Encounter (Signed)
Patient had an eye procedure " YAG" this morning. After her procedure patient's BP 90/50, when patient arrived home her BP was 110/72. Patient had taken her medications early this morning. Patient has no other symptoms besides feeling tired, but she did not have a lot of sleep the night before. Informed patient's son (DPR) that patient's BP is okay and we will let Dr. Marlou Porch know. Encouraged patient's son to keep a record of patient's BP for the next week. Informed patient's son that this was possibly an isolated accordance with the low BP. Will forward to Dr. Marlou Porch for further advisement.

## 2018-08-26 NOTE — Telephone Encounter (Signed)
Thank you for update.  No changes made right now. Candee Furbish, MD

## 2018-09-01 DIAGNOSIS — R262 Difficulty in walking, not elsewhere classified: Secondary | ICD-10-CM | POA: Diagnosis not present

## 2018-09-01 DIAGNOSIS — M545 Low back pain: Secondary | ICD-10-CM | POA: Diagnosis not present

## 2018-09-01 DIAGNOSIS — M6281 Muscle weakness (generalized): Secondary | ICD-10-CM | POA: Diagnosis not present

## 2018-09-01 DIAGNOSIS — M25651 Stiffness of right hip, not elsewhere classified: Secondary | ICD-10-CM | POA: Diagnosis not present

## 2018-09-02 DIAGNOSIS — H26492 Other secondary cataract, left eye: Secondary | ICD-10-CM | POA: Diagnosis not present

## 2018-09-07 DIAGNOSIS — M25651 Stiffness of right hip, not elsewhere classified: Secondary | ICD-10-CM | POA: Diagnosis not present

## 2018-09-07 DIAGNOSIS — M6281 Muscle weakness (generalized): Secondary | ICD-10-CM | POA: Diagnosis not present

## 2018-09-07 DIAGNOSIS — M545 Low back pain: Secondary | ICD-10-CM | POA: Diagnosis not present

## 2018-09-07 DIAGNOSIS — R262 Difficulty in walking, not elsewhere classified: Secondary | ICD-10-CM | POA: Diagnosis not present

## 2018-09-08 ENCOUNTER — Ambulatory Visit (INDEPENDENT_AMBULATORY_CARE_PROVIDER_SITE_OTHER): Payer: Medicare Other | Admitting: *Deleted

## 2018-09-08 DIAGNOSIS — Z5181 Encounter for therapeutic drug level monitoring: Secondary | ICD-10-CM

## 2018-09-08 DIAGNOSIS — I4891 Unspecified atrial fibrillation: Secondary | ICD-10-CM | POA: Diagnosis not present

## 2018-09-08 LAB — POCT INR: INR: 4 — AB (ref 2.0–3.0)

## 2018-09-08 NOTE — Patient Instructions (Signed)
Description   Do not take any Coumadin tomorrow, then start taking 1/2 tablet everyday except 1 tablet on Mondays, Wednesdays, and Fridays. Recheck INR in 2 weeks. Please resume your normal green intake. Call our office if you have any questions or unusual bleeding [phone].

## 2018-09-10 ENCOUNTER — Encounter: Payer: Self-pay | Admitting: Nurse Practitioner

## 2018-09-10 DIAGNOSIS — M545 Low back pain: Secondary | ICD-10-CM | POA: Diagnosis not present

## 2018-09-10 DIAGNOSIS — R262 Difficulty in walking, not elsewhere classified: Secondary | ICD-10-CM | POA: Diagnosis not present

## 2018-09-10 DIAGNOSIS — M6281 Muscle weakness (generalized): Secondary | ICD-10-CM | POA: Diagnosis not present

## 2018-09-10 DIAGNOSIS — M25651 Stiffness of right hip, not elsewhere classified: Secondary | ICD-10-CM | POA: Diagnosis not present

## 2018-09-13 ENCOUNTER — Ambulatory Visit (INDEPENDENT_AMBULATORY_CARE_PROVIDER_SITE_OTHER): Payer: Medicare Other | Admitting: Nurse Practitioner

## 2018-09-13 ENCOUNTER — Encounter: Payer: Self-pay | Admitting: Nurse Practitioner

## 2018-09-13 VITALS — BP 112/68 | HR 72 | Ht 67.0 in | Wt 144.4 lb

## 2018-09-13 DIAGNOSIS — I5022 Chronic systolic (congestive) heart failure: Secondary | ICD-10-CM | POA: Diagnosis not present

## 2018-09-13 DIAGNOSIS — Z79899 Other long term (current) drug therapy: Secondary | ICD-10-CM

## 2018-09-13 DIAGNOSIS — I4819 Other persistent atrial fibrillation: Secondary | ICD-10-CM | POA: Diagnosis not present

## 2018-09-13 DIAGNOSIS — I2510 Atherosclerotic heart disease of native coronary artery without angina pectoris: Secondary | ICD-10-CM

## 2018-09-13 DIAGNOSIS — I5032 Chronic diastolic (congestive) heart failure: Secondary | ICD-10-CM | POA: Diagnosis not present

## 2018-09-13 NOTE — Progress Notes (Signed)
CARDIOLOGY OFFICE NOTE  Date:  09/13/2018    Natasha Livingston Date of Birth: November 16, 1933 Medical Record #[id number]  PCP:  Cyndi Bender, PA-C  Cardiologist:  Marisa Cyphers    Chief Complaint  Patient presents with  . Follow-up    Work in visit due to concerns about BP - seen for Dr. Marlou Porch    History of Present Illness: Natasha Livingston is a 84 y.o. female who presents today for a follow up visit. Seen for Dr. Marlou Porch.   She has a history of chronic atrial fibrillation, HTN, CAD with remote CABG in 2008 by Dr. Darcey Nora. Other issues include diastolic HF, chronic fatigue, MR due to MVP, hypothyroidism, and HTN. Echocardiogram on 03/24/14 showed an ejection fraction of 50% which was an improvement from the previous echo of 40%. It was felt that some of this improvement was secondary to improvement in tachycardia mediated cardiomyopathy. She had a negative Myoview stress test on 10/23/14 showing no ischemia and her ejection fraction on that study was 54%.   I saw her back in September of 2018 - she was doing ok - had weaned off Xanax. Neuropathy seemed to be her most limiting factor. She had missed her visit for an echo - family wished to proceed and thus scheduled.   She then saw Dr. Marlou Porch in October after the echo - this had shown her EF to be lower - they opted for continued medical therapy with no further testing other than a repeat echo in 6 months. The repeat echo done in April had improved. Son wanted to try her on Delene Loll - this was started.   I last saw her in mid August - she was feeling ok but in Atrial Flutter with 2:1 and rapid VR - Toprol was increased after discussion wiith Dr. Tomasa Blase.  Improved on follow up visit with Dr. Marlou Porch later that month.   Comes in today. Here with her son. Here due to concern for low BP readings. She had Lasik surgery - the eye doctor asked her to come here. Her BP was 90/50. Other readings noted to be 110/72, 112/74 but can be in  the 90's first thing in the AM. Does not sound like she is really dizzy - no falls. She notes she does stay sleepy during the day - but taking 400 mg of Neurontin morning and night. No chest pain. Breathing seems ok. Weight looks stable. Having more issues with her hip and is getting some PT.   Past Medical History:  Diagnosis Date  . Atrial fibrillation ([place])   . Back pain   . Bruises easily   . Chest discomfort   . Diverticulitis   . Dizziness   . DOE (dyspnea on exertion)   . Forgetfulness   . Hx of cardiovascular stress test    Lexiscan Myoview (12/15):  Apical lateral and anterolateral fixed defect, no ischemia, EF 54%; low risk  . Hyperlipidemia   . Hypertension   . Hypothyroidism   . Interstitial nephritis   . Ischemic heart disease   . MVP (mitral valve prolapse)   . Neuropathy     Past Surgical History:  Procedure Laterality Date  . CARDIOVERSION  08/2008  . CORONARY ARTERY BYPASS GRAFT  04/2007  . OVARIAN CYST REMOVAL       Medications: Current Meds  Medication Sig  . allopurinol (ZYLOPRIM) 300 MG tablet Take 1 tablet by mouth daily.  Marland Kitchen ALPRAZolam (XANAX) 0.25 MG tablet Take 1/2  tablet twice daily as needed for anxiety  . colchicine 0.6 MG tablet Take 0.6 mg by mouth daily. As need for gout flares  . fexofenadine (ALLEGRA) 60 MG tablet Take 60 mg by mouth 2 (two) times daily as needed for allergies or rhinitis.  . furosemide (LASIX) 40 MG tablet TAKE 1 AND 1/2 TABLETS BY MOUTH 2 TIMES DAILY  . gabapentin (NEURONTIN) 800 MG tablet Take 0.5 tablets (400 mg total) by mouth every morning. Take 1 tablet by mouth in the evenings  . hydrocortisone (ANUSOL-HC) 2.5 % rectal cream Place 1 application rectally 2 (two) times daily as needed for hemorrhoids.  Marland Kitchen KLOR-CON M20 20 MEQ tablet TAKE 2 TABLETS BY MOUTH EVERY DAY  . levothyroxine (SYNTHROID, LEVOTHROID) 25 MCG tablet TAKE 1 TABLET BY MOUTH EVERY DAY  . metoprolol succinate (TOPROL-XL) 50 MG 24 hr tablet Take 1 tablet  (50 mg total) by mouth 2 (two) times daily. Take with or immediately following a meal.  . nitroGLYCERIN (NITROSTAT) 0.4 MG SL tablet Place 1 tablet (0.4 mg total) under the tongue every 5 (five) minutes as needed for chest pain.  . sacubitril-valsartan (ENTRESTO) 24-26 MG Take 1 tablet by mouth 2 (two) times daily.  Marland Kitchen warfarin (COUMADIN) 5 MG tablet TAKE AS DIRECTED BY COUMADIN CLINIC     Allergies: Allergies  Allergen Reactions  . Amiodarone Other (See Comments)    unknown  . Crestor [Rosuvastatin Calcium] Other (See Comments)    unknown  . Lipitor [Atorvastatin Calcium] Swelling    Social History: The patient  reports that she has never smoked. She has never used smokeless tobacco. She reports that she does not drink alcohol or use drugs.   Family History: The patient's family history includes Cerebral aneurysm in her father and unknown relative; Hypertension in her father.   Review of Systems: Please see the history of present illness.   Otherwise, the review of systems is positive for none.   All other systems are reviewed and negative.   Physical Exam: VS:  BP 112/68 (BP Location: Left Arm, Patient Position: Sitting, Cuff Size: Normal)   Pulse 72   Ht 5\' 7"  (1.702 m)   Wt 144 lb 6.4 oz (65.5 kg)   SpO2 96% Comment: at rest  BMI 22.62 kg/m  .  BMI Body mass index is 22.62 kg/m.  Wt Readings from Last 3 Encounters:  09/13/18 144 lb 6.4 oz (65.5 kg)  06/29/18 143 lb 9.6 oz (65.1 kg)  06/15/18 143 lb 6.4 oz (65 kg)    General: Elderly. Alert and in no acute distress.  She is moving slower due to her hip.  HEENT: Normal.  Neck: Supple, no JVD, carotid bruits, or masses noted.  Cardiac: Irregular irregular rhythm. Rate is ok.   No significant edema.  Respiratory:  Lungs are clear to auscultation bilaterally with normal work of breathing.  GI: Soft and nontender.  MS: No deformity or atrophy. Gait and ROM intact. She is using a cane.  Skin: Warm and dry. Color is  normal.  Neuro:  Strength and sensation are intact and no gross focal deficits noted.  Psych: Alert, appropriate and with normal affect.   LABORATORY DATA:  EKG:  EKG is not ordered today.  Lab Results  Component Value Date   WBC 7.4 06/15/2018   HGB 13.5 06/15/2018   HCT 39.6 06/15/2018   PLT 216 06/15/2018   GLUCOSE 118 (H) 06/15/2018   CHOL 160 02/01/2016   TRIG 185 (H) 02/01/2016  HDL 29 (L) 02/01/2016   LDLCALC 94 02/01/2016   ALT 16 02/01/2016   AST 29 02/01/2016   NA 142 06/15/2018   K 4.6 06/15/2018   CL 99 06/15/2018   CREATININE 0.87 06/15/2018   BUN 17 06/15/2018   CO2 27 06/15/2018   TSH 1.80 10/10/2014   INR 4.0 (A) 09/08/2018   HGBA1C (H) 04/08/2007    6.9 (NOTE)   The ADA recommends the following therapeutic goals for glycemic   control related to Hgb A1C measurement:   Goal of Therapy:   < 7.0% Hgb A1C   Action Suggested:  > 8.0% Hgb A1C   Ref:  Diabetes Care, 22, Suppl. 1, 1999         BNP (last 3 results) No results for input(s): BNP in the last 8760 hours.  ProBNP (last 3 results) No results for input(s): PROBNP in the last 8760 hours.   Other Studies Reviewed Today:  Echocardiogram 03/24/14:  - Left ventricle: The cavity size was normal. The estimated ejection fraction was 50%. There is hypokinesis of the mid-apicalanterolateral myocardium. There is hypokinesis of the mid-apicalinferolateral myocardium. - Mitral valve: Mild calcification, with mild involvement of chords. There was mild to moderate regurgitation. - Left atrium: The atrium was moderately dilated. - Right ventricle: The cavity size was mildly dilated. Wall thickness was normal. - Right atrium: The atrium was moderately to severely dilated. - Tricuspid valve: There was moderate regurgitation. - Pulmonary arteries: PA peak pressure: 52 mm Hg (S).  Impressions:  - The right ventricular systolic pressure was increased consistent with moderate pulmonary  hypertension.  08/18/17:  - Left ventricle: The cavity size was normal. Wall thickness was normal. Systolic function was severely reduced. The estimated ejection fraction was in the range of 25% to 30%. - Aortic valve: There was trivial regurgitation. - Mitral valve: Mildly calcified annulus. Prolapse, involving the anterior leaflet. There was mild regurgitation. - Left atrium: The atrium was moderately to severely dilated. - Right ventricle: The cavity size was severely dilated. Systolic function was mildly reduced. - Right atrium: The atrium was moderately to severely dilated. - Tricuspid valve: There was moderate-severe regurgitation. - Pulmonary arteries: Systolic pressure was mildly increased. PA peak pressure: 35 mm Hg (S).  04/07/18: ECHO  - Left ventricle: Inferobasal and posterior lateral hypokinesis worse. Wall thickness was increased in a pattern of mild LVH. Systolic function was moderately reduced. The estimated ejection fraction was in the range of 35% to 40%. Left ventricular diastolic function parameters were normal. - Aortic valve: There was trivial regurgitation. - Mitral valve: Moderately calcified annulus. Moderately thickened, moderately calcified leaflets . There was mild regurgitation. - Left atrium: The atrium was severely dilated. - Right ventricle: The cavity size was mildly dilated. - Right atrium: The atrium was severely dilated. - Atrial septum: No defect or patent foramen ovale was identified. - Tricuspid valve: There was severe regurgitation.    Assessment/Plan:  1. Concern for low BP - may be due to her dosing schedule of Entresto - will try moving her evening dose to earlier - if this is too problematic can take the AM dose a little later. Not really symptomatic with current BP. Would try to keep her on her current regimen but may have to cut back Toprol or stop the Entresto.   2. Persistent AF - has had prior atrial  flutter - now on higher doses of beta blocker - HR is ok on exam today.   2. HLD - she  is intolerant to statin therapy. Not discussed today.   3. Chronic systolic & diastolic HF- on Entresto and Toprol along with Lasix - will try to continue as BP allows.   4. Secondary pulmonary HTN  5. Chronic anticoagulation - on chronic warfarin - no problems noted.   6. CAD with remote CABG - no active chest pain  7. Neuropathy - quite limiting - taking 400 mg of Neurontin BID - this is probably the cause of her feeling sleepy during the day.     Current medicines are reviewed with the patient today.  The patient does not have concerns regarding medicines other than what has been noted above.  The following changes have been made:  See above.  Labs/ tests ordered today include:   No orders of the defined types were placed in this encounter.    Disposition:   FU with Dr. Marlou Porch as planned in December.  Patient is agreeable to this plan and will call if any problems develop in the interim.   SignedTruitt Merle, NP  09/13/2018 12:07 PM  [facility] [address] [place], [place]  [address] Phone: [phone] Fax: [phone]

## 2018-09-13 NOTE — Patient Instructions (Addendum)
We will be checking the following labs today - NONE  If you have labs (blood work) drawn today and your tests are completely normal, you will receive your results only by: Marland Kitchen MyChart Message (if you have MyChart) OR . A paper copy in the mail If you have any lab test that is abnormal or we need to change your treatment, we will call you to review the results.   Medication Instructions:    Continue with your current medicines. BUT  I would try to move the night time dose of Entresto up a little earlier OR move the morning dose a little later  Touch base with Cyndi Bender, PA about the dose of Neurontin - I think this is what makes you sleepy during the day.    If you need a refill on your cardiac medications before your next appointment, please call your pharmacy.     Testing/Procedures To Be Arranged:  N/A  Follow-Up:   See Dr. Marlou Porch in December (has recall)    At [facility], you and your health needs are our priority.  As part of our continuing mission to provide you with exceptional heart care, we have created designated Provider Care Teams.  These Care Teams include your primary Cardiologist (physician) and Advanced Practice Providers (APPs -  Physician Assistants and Nurse Practitioners) who all work together to provide you with the care you need, when you need it.  Special Instructions:  . None  Call the [place] office at [phone] if you have any questions, problems or concerns.

## 2018-09-14 DIAGNOSIS — M25651 Stiffness of right hip, not elsewhere classified: Secondary | ICD-10-CM | POA: Diagnosis not present

## 2018-09-14 DIAGNOSIS — R262 Difficulty in walking, not elsewhere classified: Secondary | ICD-10-CM | POA: Diagnosis not present

## 2018-09-14 DIAGNOSIS — M6281 Muscle weakness (generalized): Secondary | ICD-10-CM | POA: Diagnosis not present

## 2018-09-14 DIAGNOSIS — M545 Low back pain: Secondary | ICD-10-CM | POA: Diagnosis not present

## 2018-09-17 DIAGNOSIS — R262 Difficulty in walking, not elsewhere classified: Secondary | ICD-10-CM | POA: Diagnosis not present

## 2018-09-17 DIAGNOSIS — M545 Low back pain: Secondary | ICD-10-CM | POA: Diagnosis not present

## 2018-09-17 DIAGNOSIS — M25651 Stiffness of right hip, not elsewhere classified: Secondary | ICD-10-CM | POA: Diagnosis not present

## 2018-09-17 DIAGNOSIS — M6281 Muscle weakness (generalized): Secondary | ICD-10-CM | POA: Diagnosis not present

## 2018-09-21 DIAGNOSIS — M545 Low back pain: Secondary | ICD-10-CM | POA: Diagnosis not present

## 2018-09-21 DIAGNOSIS — M25651 Stiffness of right hip, not elsewhere classified: Secondary | ICD-10-CM | POA: Diagnosis not present

## 2018-09-21 DIAGNOSIS — M6281 Muscle weakness (generalized): Secondary | ICD-10-CM | POA: Diagnosis not present

## 2018-09-21 DIAGNOSIS — R262 Difficulty in walking, not elsewhere classified: Secondary | ICD-10-CM | POA: Diagnosis not present

## 2018-09-23 ENCOUNTER — Ambulatory Visit (INDEPENDENT_AMBULATORY_CARE_PROVIDER_SITE_OTHER): Payer: Medicare Other | Admitting: *Deleted

## 2018-09-23 DIAGNOSIS — Z5181 Encounter for therapeutic drug level monitoring: Secondary | ICD-10-CM

## 2018-09-23 DIAGNOSIS — H04222 Epiphora due to insufficient drainage, left lacrimal gland: Secondary | ICD-10-CM | POA: Diagnosis not present

## 2018-09-23 DIAGNOSIS — H04123 Dry eye syndrome of bilateral lacrimal glands: Secondary | ICD-10-CM | POA: Diagnosis not present

## 2018-09-23 DIAGNOSIS — I4891 Unspecified atrial fibrillation: Secondary | ICD-10-CM

## 2018-09-23 DIAGNOSIS — H16103 Unspecified superficial keratitis, bilateral: Secondary | ICD-10-CM | POA: Diagnosis not present

## 2018-09-23 LAB — POCT INR: INR: 2.7 (ref 2.0–3.0)

## 2018-09-23 NOTE — Patient Instructions (Signed)
Description   Continue taking 1/2 tablet everyday except 1 tablet on Mondays, Wednesdays, and Fridays. Recheck INR in 3 weeks. Please resume your normal green intake. Call our office if you have any questions or unusual bleeding [phone].

## 2018-09-28 DIAGNOSIS — R262 Difficulty in walking, not elsewhere classified: Secondary | ICD-10-CM | POA: Diagnosis not present

## 2018-09-28 DIAGNOSIS — M6281 Muscle weakness (generalized): Secondary | ICD-10-CM | POA: Diagnosis not present

## 2018-09-28 DIAGNOSIS — M25651 Stiffness of right hip, not elsewhere classified: Secondary | ICD-10-CM | POA: Diagnosis not present

## 2018-09-28 DIAGNOSIS — M545 Low back pain: Secondary | ICD-10-CM | POA: Diagnosis not present

## 2018-09-29 ENCOUNTER — Other Ambulatory Visit: Payer: Self-pay | Admitting: Cardiology

## 2018-10-05 DIAGNOSIS — M25651 Stiffness of right hip, not elsewhere classified: Secondary | ICD-10-CM | POA: Diagnosis not present

## 2018-10-05 DIAGNOSIS — R262 Difficulty in walking, not elsewhere classified: Secondary | ICD-10-CM | POA: Diagnosis not present

## 2018-10-05 DIAGNOSIS — M6281 Muscle weakness (generalized): Secondary | ICD-10-CM | POA: Diagnosis not present

## 2018-10-05 DIAGNOSIS — M545 Low back pain: Secondary | ICD-10-CM | POA: Diagnosis not present

## 2018-10-07 DIAGNOSIS — R262 Difficulty in walking, not elsewhere classified: Secondary | ICD-10-CM | POA: Diagnosis not present

## 2018-10-07 DIAGNOSIS — M545 Low back pain: Secondary | ICD-10-CM | POA: Diagnosis not present

## 2018-10-07 DIAGNOSIS — M6281 Muscle weakness (generalized): Secondary | ICD-10-CM | POA: Diagnosis not present

## 2018-10-07 DIAGNOSIS — M25651 Stiffness of right hip, not elsewhere classified: Secondary | ICD-10-CM | POA: Diagnosis not present

## 2018-10-12 ENCOUNTER — Ambulatory Visit (INDEPENDENT_AMBULATORY_CARE_PROVIDER_SITE_OTHER): Payer: Medicare Other

## 2018-10-12 DIAGNOSIS — I4891 Unspecified atrial fibrillation: Secondary | ICD-10-CM | POA: Diagnosis not present

## 2018-10-12 DIAGNOSIS — M545 Low back pain: Secondary | ICD-10-CM | POA: Diagnosis not present

## 2018-10-12 DIAGNOSIS — M25551 Pain in right hip: Secondary | ICD-10-CM | POA: Diagnosis not present

## 2018-10-12 DIAGNOSIS — Z5181 Encounter for therapeutic drug level monitoring: Secondary | ICD-10-CM | POA: Diagnosis not present

## 2018-10-12 LAB — POCT INR: INR: 2 (ref 2.0–3.0)

## 2018-10-12 NOTE — Patient Instructions (Signed)
Description   Take 1 tablet today, then resume same dosage 1/2 tablet everyday except 1 tablet on Mondays, Wednesdays, and Fridays. Recheck INR in 4 weeks. Please resume your normal green intake. Call our office if you have any questions or unusual bleeding [phone].

## 2018-10-22 ENCOUNTER — Encounter: Payer: Self-pay | Admitting: Cardiology

## 2018-10-22 ENCOUNTER — Ambulatory Visit (INDEPENDENT_AMBULATORY_CARE_PROVIDER_SITE_OTHER): Payer: Medicare Other | Admitting: Cardiology

## 2018-10-22 ENCOUNTER — Ambulatory Visit: Payer: Medicare Other | Admitting: Cardiology

## 2018-10-22 VITALS — BP 112/70 | HR 70 | Ht 67.0 in | Wt 148.8 lb

## 2018-10-22 DIAGNOSIS — I5032 Chronic diastolic (congestive) heart failure: Secondary | ICD-10-CM

## 2018-10-22 DIAGNOSIS — I4819 Other persistent atrial fibrillation: Secondary | ICD-10-CM

## 2018-10-22 DIAGNOSIS — E7800 Pure hypercholesterolemia, unspecified: Secondary | ICD-10-CM

## 2018-10-22 DIAGNOSIS — M7601 Gluteal tendinitis, right hip: Secondary | ICD-10-CM | POA: Diagnosis not present

## 2018-10-22 DIAGNOSIS — I2510 Atherosclerotic heart disease of native coronary artery without angina pectoris: Secondary | ICD-10-CM

## 2018-10-22 MED ORDER — FUROSEMIDE 40 MG PO TABS: 40.0000 mg | ORAL_TABLET | Freq: Every day | ORAL | 3 refills | Status: DC

## 2018-10-22 NOTE — Patient Instructions (Addendum)
Medication Instructions:  Please decrease your Furosemide to 40 mg once a day.  You may take (1) extra tablet as needed for weight gain of 3 lbs overnight. Continue all other medications as listed.  Limit fluid intake to 1.5 liter per day.  If you need a refill on your cardiac medications before your next appointment, please call your pharmacy.   Follow-Up: At [facility], you and your health needs are our priority.  As part of our continuing mission to provide you with exceptional heart care, we have created designated Provider Care Teams.  These Care Teams include your primary Cardiologist (physician) and Advanced Practice Providers (APPs -  Physician Assistants and Nurse Practitioners) who all work together to provide you with the care you need, when you need it. You will need a follow up appointment in 3 months with Truitt Merle, NP and Dr Marlou Porch in 6 months.  Please call our office 2 months in advance to schedule this appointment.  You may see Candee Furbish, MD or one of the following Advanced Practice Providers on your designated Care Team:   Truitt Merle, NP Cecilie Kicks, NP . Kathyrn Drown, NP  Thank you for choosing [facility]!!

## 2018-10-22 NOTE — Progress Notes (Signed)
Cardiology Office Note:    Date:  10/22/2018   ID:  Natasha Livingston, DOB 1934/01/14, MRN [id number]  PCP:  Cyndi Bender, PA-C  Cardiologist:  Candee Furbish, MD  Electrophysiologist:  None   Referring MD: Cyndi Bender, PA-C     History of Present Illness:    Natasha Livingston is a 84 y.o. female follow-up coronary artery disease status post CABG 2008 Dr. Darcey Nora with chronic fatigue mitral valve prolapse mitral regurgitation hypertension.  EF between 25 to 30% in October 2018, on repeat 35 to 40% in June 2019.  May have had tachycardia mediated cardiomyopathy in the past.  In 2015 had a low risk nuclear stress test.  With her EF reduced, Entresto was started.  Atrial flutter with 2-1 conduction occurred however in August 2019.  Toprol was increased, Dr. Curt Bears made aware.  Blood pressure at one point was 90/50.  Other readings were 112/74.  No dizziness.  Neurontin makes her sleepy at times.  She has been doing quite well currently.  She just had a back injection, right gluteal region.  She is ready to go lay down.  No syncope, no significant dizziness however we did decide to cut back on her Lasix today.  Last creatinine 0.87 on 06/15/2018.  Potassium 4.6.  LDL 109.  Past Medical History:  Diagnosis Date  . Atrial fibrillation ([place])   . Back pain   . Bruises easily   . Chest discomfort   . Diverticulitis   . Dizziness   . DOE (dyspnea on exertion)   . Forgetfulness   . Hx of cardiovascular stress test    Lexiscan Myoview (12/15):  Apical lateral and anterolateral fixed defect, no ischemia, EF 54%; low risk  . Hyperlipidemia   . Hypertension   . Hypothyroidism   . Interstitial nephritis   . Ischemic heart disease   . MVP (mitral valve prolapse)   . Neuropathy     Past Surgical History:  Procedure Laterality Date  . CARDIOVERSION  08/2008  . CORONARY ARTERY BYPASS GRAFT  04/2007  . OVARIAN CYST REMOVAL      Current Medications: Current Meds  Medication Sig  .  allopurinol (ZYLOPRIM) 300 MG tablet Take 1 tablet by mouth daily.  Marland Kitchen ALPRAZolam (XANAX) 0.25 MG tablet Take 1/2 tablet twice daily as needed for anxiety  . colchicine 0.6 MG tablet Take 0.6 mg by mouth daily. As need for gout flares  . fexofenadine (ALLEGRA) 60 MG tablet Take 60 mg by mouth 2 (two) times daily as needed for allergies or rhinitis.  . furosemide (LASIX) 40 MG tablet Take 1 tablet (40 mg total) by mouth daily. May take 1 extra tablet if wt increases 3 lbs overnight  . gabapentin (NEURONTIN) 800 MG tablet Take 0.5 tablets (400 mg total) by mouth every morning. Take 1 tablet by mouth in the evenings  . hydrocortisone (ANUSOL-HC) 2.5 % rectal cream Place 1 application rectally 2 (two) times daily as needed for hemorrhoids.  Marland Kitchen KLOR-CON M20 20 MEQ tablet TAKE 2 TABLETS BY MOUTH EVERY DAY  . levothyroxine (SYNTHROID, LEVOTHROID) 25 MCG tablet TAKE 1 TABLET BY MOUTH EVERY DAY  . metoprolol succinate (TOPROL-XL) 50 MG 24 hr tablet Take 1 tablet (50 mg total) by mouth 2 (two) times daily. Take with or immediately following a meal.  . nitroGLYCERIN (NITROSTAT) 0.4 MG SL tablet Place 1 tablet (0.4 mg total) under the tongue every 5 (five) minutes as needed for chest pain.  . sacubitril-valsartan (ENTRESTO) 24-26  MG Take 1 tablet by mouth 2 (two) times daily.  Marland Kitchen warfarin (COUMADIN) 5 MG tablet TAKE AS DIRECTED BY COUMADIN CLINIC  . [DISCONTINUED] furosemide (LASIX) 40 MG tablet TAKE 1 AND 1/2 TABLETS BY MOUTH 2 TIMES DAILY     Allergies:   Amiodarone; Crestor [rosuvastatin calcium]; and Lipitor [atorvastatin calcium]   Social History   Socioeconomic History  . Marital status: Married    Spouse name: Not on file  . Number of children: Not on file  . Years of education: Not on file  . Highest education level: Not on file  Occupational History  . Not on file  Social Needs  . Financial resource strain: Not on file  . Food insecurity:    Worry: Not on file    Inability: Not on file  .  Transportation needs:    Medical: Not on file    Non-medical: Not on file  Tobacco Use  . Smoking status: Never Smoker  . Smokeless tobacco: Never Used  Substance and Sexual Activity  . Alcohol use: No  . Drug use: No  . Sexual activity: Never  Lifestyle  . Physical activity:    Days per week: Not on file    Minutes per session: Not on file  . Stress: Not on file  Relationships  . Social connections:    Talks on phone: Not on file    Gets together: Not on file    Attends religious service: Not on file    Active member of club or organization: Not on file    Attends meetings of clubs or organizations: Not on file    Relationship status: Not on file  Other Topics Concern  . Not on file  Social History Narrative  . Not on file     Family History: The patient's family history includes Cerebral aneurysm in her father and unknown relative; Hypertension in her father. There is no history of Heart attack or Stroke.  ROS:   Please see the history of present illness.     All other systems reviewed and are negative.  EKGs/Labs/Other Studies Reviewed:    The following studies were reviewed today: Prior office note echocardiogram EKG lab work  EKG:  EKG is not ordered today.   Recent Labs: 06/15/2018: BUN 17; Creatinine, Ser 0.87; Hemoglobin 13.5; Platelets 216; Potassium 4.6; Sodium 142  Recent Lipid Panel    Component Value Date/Time   CHOL 160 02/01/2016 1224   TRIG 185 (H) 02/01/2016 1224   HDL 29 (L) 02/01/2016 1224   CHOLHDL 5.5 (H) 02/01/2016 1224   VLDL 37 (H) 02/01/2016 1224   LDLCALC 94 02/01/2016 1224    Physical Exam:    VS:  BP 112/70   Pulse 70   Ht 5\' 7"  (1.702 m)   Wt 148 lb 12.8 oz (67.5 kg)   SpO2 95%   BMI 23.31 kg/m     Wt Readings from Last 3 Encounters:  10/22/18 148 lb 12.8 oz (67.5 kg)  09/13/18 144 lb 6.4 oz (65.5 kg)  06/29/18 143 lb 9.6 oz (65.1 kg)     GEN:  Well nourished, well developed in no acute distress HEENT: Normal NECK:  No JVD; No carotid bruits LYMPHATICS: No lymphadenopathy CARDIAC: normal rate, no murmurs, rubs, gallops RESPIRATORY:  Clear to auscultation without rales, wheezing or rhonchi  ABDOMEN: Soft, non-tender, non-distended MUSCULOSKELETAL:  No edema; No deformity  SKIN: Warm and dry NEUROLOGIC:  Alert and oriented x 3 PSYCHIATRIC:  Normal affect  ASSESSMENT:    1. Persistent atrial fibrillation   2. Chronic diastolic CHF (congestive heart failure) ([place])   3. Coronary artery disease involving native coronary artery of native heart without angina pectoris   4. Pure hypercholesterolemia    PLAN:    In order of problems listed above:  Hypotension - Likely a result of Entresto Lasix combination.  No real symptoms.  What we will do is cut her Lasix back to 40 mg once a day.  Daily weights.  Salt restriction.  Fluid restriction 1.5 L a day.  If she gains 3 pounds in 1 day she may take her extra Lasix.  It is possible that with the [facility], she does not need as much diuretic.  Persistent atrial fibrillation -Previously atrial flutter was noteworthy.  Beta-blocker.  Stable.  Very good rate control currently. -On warfarin, no bleeding  Hyperlipidemia - Had statin intolerance.  Not interested  Chronic systolic and diastolic heart failure -Entresto Toprol Lasix, cutting back on Lasix  Chronic anticoagulation -Chronic warfarin. Last 2.0  Coronary artery disease with CABG - Currently doing well without any anginal symptoms.  Neuropathy - Quite limiting currently Neurontin.  Probably cause for sleepiness.   Medication Adjustments/Labs and Tests Ordered: Current medicines are reviewed at length with the patient today.  Concerns regarding medicines are outlined above.  No orders of the defined types were placed in this encounter.  Meds ordered this encounter  Medications  . furosemide (LASIX) 40 MG tablet    Sig: Take 1 tablet (40 mg total) by mouth daily. May take 1 extra tablet if wt  increases 3 lbs overnight    Dispense:  100 tablet    Refill:  3    Patient Instructions  Medication Instructions:  Please decrease your Furosemide to 40 mg once a day.  You may take (1) extra tablet as needed for weight gain of 3 lbs overnight. Continue all other medications as listed.  Limit fluid intake to 1.5 liter per day.  If you need a refill on your cardiac medications before your next appointment, please call your pharmacy.   Follow-Up: At [facility], you and your health needs are our priority.  As part of our continuing mission to provide you with exceptional heart care, we have created designated Provider Care Teams.  These Care Teams include your primary Cardiologist (physician) and Advanced Practice Providers (APPs -  Physician Assistants and Nurse Practitioners) who all work together to provide you with the care you need, when you need it. You will need a follow up appointment in 3 months with Truitt Merle, NP and Dr Marlou Porch in 6 months.  Please call our office 2 months in advance to schedule this appointment.  You may see Candee Furbish, MD or one of the following Advanced Practice Providers on your designated Care Team:   Truitt Merle, NP Cecilie Kicks, NP . Kathyrn Drown, NP  Thank you for choosing [facility]!!         Signed, Candee Furbish, MD  10/22/2018 3:19 PM    [place]

## 2018-10-28 ENCOUNTER — Other Ambulatory Visit: Payer: Self-pay

## 2018-10-28 ENCOUNTER — Encounter (HOSPITAL_COMMUNITY): Payer: Self-pay

## 2018-10-28 ENCOUNTER — Emergency Department (HOSPITAL_COMMUNITY)
Admission: EM | Admit: 2018-10-28 | Discharge: 2018-10-28 | Disposition: A | Discharge status: Home / Self Care | Payer: Medicare Other | Attending: Emergency Medicine | Admitting: Emergency Medicine

## 2018-10-28 DIAGNOSIS — I10 Essential (primary) hypertension: Secondary | ICD-10-CM | POA: Insufficient documentation

## 2018-10-28 DIAGNOSIS — E039 Hypothyroidism, unspecified: Secondary | ICD-10-CM | POA: Insufficient documentation

## 2018-10-28 DIAGNOSIS — R040 Epistaxis: Secondary | ICD-10-CM | POA: Insufficient documentation

## 2018-10-28 LAB — I-STAT CHEM 8, ED
BUN: 36 mg/dL — ABNORMAL HIGH (ref 8–23)
CALCIUM ION: 1.16 mmol/L (ref 1.15–1.40)
Chloride: 103 mmol/L (ref 98–111)
Creatinine, Ser: 0.9 mg/dL (ref 0.44–1.00)
GLUCOSE: 136 mg/dL — AB (ref 70–99)
HCT: 39 % (ref 36.0–46.0)
Hemoglobin: 13.3 g/dL (ref 12.0–15.0)
Potassium: 4.1 mmol/L (ref 3.5–5.1)
Sodium: 138 mmol/L (ref 135–145)
TCO2: 26 mmol/L (ref 22–32)

## 2018-10-28 LAB — PROTIME-INR
INR: 1.94
PROTHROMBIN TIME: 21.9 s — AB (ref 11.4–15.2)

## 2018-10-28 MED ORDER — OXYMETAZOLINE HCL 0.05 % NA SOLN: 1.0000 | Freq: Two times a day (BID) | NASAL | 0 refills | Status: DC

## 2018-10-28 MED ORDER — OXYMETAZOLINE HCL 0.05 % NA SOLN: 1.0000 | Freq: Two times a day (BID) | NASAL | 0 refills | Status: DC | PRN

## 2018-10-28 NOTE — ED Triage Notes (Signed)
patient reports nosebleed since 2030 and patient's son reports that they got the bleeding stopped with using an ice pack. Bleeding started again when the patient blew her nose today. No bleeding currently while in triage. Patient is currently on coumadin. Bleeding as been in the right nares.

## 2018-10-28 NOTE — ED Notes (Signed)
She feels well and still has no bleeding and tells me she is not swallowing any blood.

## 2018-10-28 NOTE — ED Notes (Signed)
I see no evidence of active bleeding at this time, and she tells me she is not swallowing any blood. Her son remains with her.

## 2018-10-29 NOTE — ED Provider Notes (Signed)
Emergency Department Provider Note   I have reviewed the triage vital signs and the nursing notes.   HISTORY  Chief Complaint Epistaxis   HPI Natasha Livingston is a 84 y.o. female who presents for epistaxis. Patient's son states she is on coumadin and has been therapeutic. Last night around 2000 she had onset of bleeding that stopped after 20-30 minutes of ice. Started again this morning, stopped after 45 minutes on the way here. Currently not bleeding. No other associated symptoms.   No other associated or modifying symptoms.    Past Medical History:  Diagnosis Date  . Atrial fibrillation ([place])   . Back pain   . Bruises easily   . Chest discomfort   . Diverticulitis   . Dizziness   . DOE (dyspnea on exertion)   . Forgetfulness   . Hx of cardiovascular stress test    Lexiscan Myoview (12/15):  Apical lateral and anterolateral fixed defect, no ischemia, EF 54%; low risk  . Hyperlipidemia   . Hypertension   . Hypothyroidism   . Interstitial nephritis   . Ischemic heart disease   . MVP (mitral valve prolapse)   . Neuropathy     Patient Active Problem List   Diagnosis Date Noted  . Onychomycosis of left great toe 09/10/2015  . Encounter for therapeutic drug monitoring 12/07/2013  . Depression 12/07/2013  . Paroxysmal atrial fibrillation ([place]) 08/18/2013  . Hypothyroid 04/20/2013  . Dermatitis 12/31/2012  . Benign hypertensive heart disease without heart failure 12/24/2011  . Long term (current) use of anticoagulants 08/27/2011  . Edema of foot 04/28/2011  . Malaise and fatigue 04/15/2011  . Chest discomfort   . Dizziness   . MVP (mitral valve prolapse)   . Dyspnea   . Bruises easily   . Back pain   . Forgetfulness   . Atrial fibrillation ([place])   . Hypertension   . Hypothyroidism   . Aortic stenosis   . Hyperlipidemia   . Diverticulitis   . Interstitial nephritis   . Neuropathy   . Ischemic heart disease   . Atrial fibrillation ([place]) 02/03/2011     Past Surgical History:  Procedure Laterality Date  . CARDIOVERSION  08/2008  . CORONARY ARTERY BYPASS GRAFT  04/2007  . OVARIAN CYST REMOVAL      Current Outpatient Rx  . Order #: [id number] Class: Historical Med  . Order #: [id number] Class: Historical Med  . Order #: [id number] Class: Historical Med  . Order #: [id number] Class: Historical Med  . Order #: [id number] Class: Historical Med  . Order #: [id number] Class: Normal  . Order #: [id number] Class: Normal  . Order #: [id number] Class: Historical Med  . Order #: [id number] Class: Normal  . Order #: [id number] Class: Normal  . Order #: [id number] Class: Normal  . Order #: [id number] Class: Normal  . Order #: [id number] Class: Normal  . Order #: [id number] Class: Normal  . Order #: [id number] Class: Normal    Allergies Amiodarone; Crestor [rosuvastatin calcium]; and Lipitor [atorvastatin calcium]  Family History  Problem Relation Age of Onset  . Cerebral aneurysm Father   . Hypertension Father   . Cerebral aneurysm Other   . Heart attack Neg Hx   . Stroke Neg Hx     Social History Social History   Tobacco Use  . Smoking status: Never Smoker  . Smokeless tobacco: Never Used  Substance Use Topics  . Alcohol use: No  . Drug use: No    Review of Systems  All other systems negative except as documented in the  HPI. All pertinent positives and negatives as reviewed in the HPI. ____________________________________________   PHYSICAL EXAM:  VITAL SIGNS: ED Triage Vitals  Enc Vitals Group     BP 10/28/18 1333 (!) 132/91     Pulse Rate 10/28/18 1333 86     Resp 10/28/18 1333 16     Temp 10/28/18 1333 97.9 F (36.6 C)     Temp Source 10/28/18 1333 Oral     SpO2 10/28/18 1333 97 %     Weight 10/28/18 1335 142 lb (64.4 kg)     Height 10/28/18 1335 5\' 7"  (1.702 m)    Constitutional: Alert and oriented. Well appearing and in no acute distress. Eyes: Conjunctivae are normal. PERRL. EOMI. Head: Atraumatic. Nose: No  congestion/rhinnorhea. Mouth/Throat: Mucous membranes are moist.  Oropharynx non-erythematous. Neck: No stridor.  No meningeal signs.   Cardiovascular: Normal rate, regular rhythm. Good peripheral circulation. Grossly normal heart sounds.   Respiratory: Normal respiratory effort.  No retractions. Lungs CTAB. Gastrointestinal: Soft and nontender. No distention.  Musculoskeletal: No lower extremity tenderness nor edema. No gross deformities of extremities. Neurologic:  Normal speech and language. No gross focal neurologic deficits are appreciated.  Skin:  Skin is warm, dry and intact. No rash noted.  ____________________________________________   LABS (all labs ordered are listed, but only abnormal results are displayed)  Labs Reviewed  PROTIME-INR - Abnormal; Notable for the following components:      Result Value   Prothrombin Time 21.9 (*)    All other components within normal limits  I-STAT CHEM 8, ED - Abnormal; Notable for the following components:   BUN 36 (*)    Glucose, Bld 136 (*)    All other components within normal limits   ____________________________________________   INITIAL IMPRESSION / ASSESSMENT AND PLAN / ED COURSE  Observed for multiple hours without recurrent bleed. INR not supratherapeutic. Hb stable. Dc home with instructions for afrin.   Pertinent labs & imaging results that were available during my care of the patient were reviewed by me and considered in my medical decision making (see chart for details).  ____________________________________________  FINAL CLINICAL IMPRESSION(S) / ED DIAGNOSES  Final diagnoses:  Anterior epistaxis     MEDICATIONS GIVEN DURING THIS VISIT:  Medications - No data to display   NEW OUTPATIENT MEDICATIONS STARTED DURING THIS VISIT:  Discharge Medication List as of 10/28/2018  4:26 PM      Note:  This note was prepared with assistance of Dragon voice recognition software. Occasional wrong-word or sound-a-like  substitutions may have occurred due to the inherent limitations of voice recognition software.   Merrily Pew, MD 10/29/18 1327

## 2018-11-08 ENCOUNTER — Telehealth: Payer: Self-pay | Admitting: Nurse Practitioner

## 2018-11-08 NOTE — Telephone Encounter (Signed)
New Message            [facility] Pre-operative Risk Assessment    Request for surgical clearance:  1. What type of surgery is being performed? Extracted tooth 21   2. When is this surgery scheduled? 11/11/18  3. What type of clearance is required (medical clearance vs. Pharmacy clearance to hold med vs. Both)? Medical  4. Are there any medications that need to be held prior to surgery and how long? Warfin   5. Practice name and name of physician performing surgery? [facility]  What is your office phone number [phone]  7.   What is your office fax number [phone]  8.   Anesthesia type (None, local, MAC, general) ? Local   Porfirio Mylar 11/08/2018, 11:19 AM  _________________________________________________________________   (provider comments below)

## 2018-11-08 NOTE — Telephone Encounter (Signed)
Spoke with Jenny Reichmann from [facility], [place] office who states that they did receive the clearance recommendations from our office.

## 2018-11-08 NOTE — Telephone Encounter (Signed)
   Primary Cardiologist: Candee Furbish, MD  85 yo female with CAD s/p CABG in 1245, systolic CHF, HTN, atrial flutter on anticoagulation with Warfarin.  Last seen by Dr. Marlou Porch 10/22/18.    Chart reviewed as part of pre-operative protocol coverage. Dental cleanings and simple dental extractions are considered low risk procedures per guidelines and generally do not require any specific cardiac clearance. It is also generally accepted that, for simple extractions and dental cleanings, there is no need to interrupt blood thinner therapy.   PLAN:  1. Antibiotic prophylaxis is not required for this patient. 2. The patient can remain on Warfarin without interruption. Please call with questions.  Call back staff: The note was faxed to the requesting dentist. Please call his office to make sure it was received. This note will be removed from the preop pool.  Richardson Dopp, PA-C 11/08/2018, 4:16 PM

## 2018-11-09 ENCOUNTER — Ambulatory Visit (INDEPENDENT_AMBULATORY_CARE_PROVIDER_SITE_OTHER): Payer: Medicare Other

## 2018-11-09 DIAGNOSIS — I4891 Unspecified atrial fibrillation: Secondary | ICD-10-CM

## 2018-11-09 DIAGNOSIS — Z5181 Encounter for therapeutic drug level monitoring: Secondary | ICD-10-CM

## 2018-11-09 LAB — POCT INR: INR: 1.4 — AB (ref 2.0–3.0)

## 2018-11-09 NOTE — Patient Instructions (Signed)
Description   Take an extra 1/2 tablet today, then start taking 1 tablet everyday except 1/2 tablet on Tuesdays, Thursdays and Saturdays. Recheck INR in 2 weeks. Please resume your normal green intake. Call our office if you have any questions or unusual bleeding [phone].

## 2018-11-11 DIAGNOSIS — M545 Low back pain: Secondary | ICD-10-CM | POA: Diagnosis not present

## 2018-11-11 DIAGNOSIS — M461 Sacroiliitis, not elsewhere classified: Secondary | ICD-10-CM | POA: Diagnosis not present

## 2018-11-22 ENCOUNTER — Ambulatory Visit (INDEPENDENT_AMBULATORY_CARE_PROVIDER_SITE_OTHER): Payer: Medicare Other | Admitting: *Deleted

## 2018-11-22 DIAGNOSIS — I4891 Unspecified atrial fibrillation: Secondary | ICD-10-CM | POA: Diagnosis not present

## 2018-11-22 DIAGNOSIS — Z5181 Encounter for therapeutic drug level monitoring: Secondary | ICD-10-CM | POA: Diagnosis not present

## 2018-11-22 LAB — POCT INR: INR: 2.4 (ref 2.0–3.0)

## 2018-11-22 NOTE — Patient Instructions (Addendum)
Description   Continue  taking 1 tablet everyday except 1/2 tablet on Tuesdays, Thursdays and Saturdays. Recheck INR in 2 weeks. Keep your normal green intake. Call our office if you have any questions or unusual bleeding [phone].

## 2018-12-02 DIAGNOSIS — M545 Low back pain: Secondary | ICD-10-CM | POA: Diagnosis not present

## 2018-12-02 DIAGNOSIS — M461 Sacroiliitis, not elsewhere classified: Secondary | ICD-10-CM | POA: Diagnosis not present

## 2018-12-03 DIAGNOSIS — R05 Cough: Secondary | ICD-10-CM | POA: Diagnosis not present

## 2018-12-06 ENCOUNTER — Ambulatory Visit (INDEPENDENT_AMBULATORY_CARE_PROVIDER_SITE_OTHER): Payer: Medicare Other

## 2018-12-06 DIAGNOSIS — Z5181 Encounter for therapeutic drug level monitoring: Secondary | ICD-10-CM

## 2018-12-06 DIAGNOSIS — I4891 Unspecified atrial fibrillation: Secondary | ICD-10-CM

## 2018-12-06 LAB — POCT INR: INR: 2 (ref 2.0–3.0)

## 2018-12-06 NOTE — Patient Instructions (Signed)
Description   Take an extra 1/2 tablet today and tomorrow, (since you missed yesterday's dosage of Coumadin), then resume same dosage 1 tablet everyday except 1/2 tablet on Tuesdays, Thursdays and Saturdays. Recheck INR in 3 weeks. Keep your normal green intake. Call our office if you have any questions or unusual bleeding [phone].

## 2018-12-27 ENCOUNTER — Ambulatory Visit (INDEPENDENT_AMBULATORY_CARE_PROVIDER_SITE_OTHER): Payer: Medicare Other

## 2018-12-27 DIAGNOSIS — Z5181 Encounter for therapeutic drug level monitoring: Secondary | ICD-10-CM | POA: Diagnosis not present

## 2018-12-27 DIAGNOSIS — I4891 Unspecified atrial fibrillation: Secondary | ICD-10-CM | POA: Diagnosis not present

## 2018-12-27 LAB — POCT INR: INR: 2.5 (ref 2.0–3.0)

## 2018-12-27 NOTE — Patient Instructions (Signed)
Description   Continue on same dosage 1 tablet everyday except 1/2 tablet on Tuesdays, Thursdays and Saturdays. Recheck INR in 4 weeks. Keep your normal green intake. Call our office if you have any questions or unusual bleeding [phone].

## 2019-01-03 DIAGNOSIS — M545 Low back pain: Secondary | ICD-10-CM | POA: Diagnosis not present

## 2019-01-03 DIAGNOSIS — M461 Sacroiliitis, not elsewhere classified: Secondary | ICD-10-CM | POA: Diagnosis not present

## 2019-01-06 DIAGNOSIS — M6281 Muscle weakness (generalized): Secondary | ICD-10-CM | POA: Diagnosis not present

## 2019-01-06 DIAGNOSIS — M461 Sacroiliitis, not elsewhere classified: Secondary | ICD-10-CM | POA: Diagnosis not present

## 2019-01-06 DIAGNOSIS — R2689 Other abnormalities of gait and mobility: Secondary | ICD-10-CM | POA: Diagnosis not present

## 2019-01-06 DIAGNOSIS — M545 Low back pain: Secondary | ICD-10-CM | POA: Diagnosis not present

## 2019-01-11 DIAGNOSIS — M545 Low back pain: Secondary | ICD-10-CM | POA: Diagnosis not present

## 2019-01-11 DIAGNOSIS — R2689 Other abnormalities of gait and mobility: Secondary | ICD-10-CM | POA: Diagnosis not present

## 2019-01-11 DIAGNOSIS — M461 Sacroiliitis, not elsewhere classified: Secondary | ICD-10-CM | POA: Diagnosis not present

## 2019-01-11 DIAGNOSIS — M6281 Muscle weakness (generalized): Secondary | ICD-10-CM | POA: Diagnosis not present

## 2019-01-25 ENCOUNTER — Telehealth: Payer: Self-pay

## 2019-01-25 NOTE — Telephone Encounter (Signed)

## 2019-01-26 ENCOUNTER — Other Ambulatory Visit: Payer: Self-pay

## 2019-01-26 ENCOUNTER — Ambulatory Visit: Payer: Medicare Other | Admitting: Nurse Practitioner

## 2019-01-26 ENCOUNTER — Ambulatory Visit (INDEPENDENT_AMBULATORY_CARE_PROVIDER_SITE_OTHER): Payer: Medicare Other | Admitting: Pharmacist

## 2019-01-26 DIAGNOSIS — I4891 Unspecified atrial fibrillation: Secondary | ICD-10-CM

## 2019-01-26 DIAGNOSIS — Z5181 Encounter for therapeutic drug level monitoring: Secondary | ICD-10-CM

## 2019-01-26 LAB — POCT INR: INR: 3.5 — AB (ref 2.0–3.0)

## 2019-02-15 ENCOUNTER — Telehealth: Payer: Self-pay

## 2019-02-15 NOTE — Telephone Encounter (Signed)

## 2019-02-16 ENCOUNTER — Ambulatory Visit (INDEPENDENT_AMBULATORY_CARE_PROVIDER_SITE_OTHER): Payer: Medicare Other | Admitting: Pharmacist

## 2019-02-16 ENCOUNTER — Other Ambulatory Visit: Payer: Self-pay

## 2019-02-16 DIAGNOSIS — Z5181 Encounter for therapeutic drug level monitoring: Secondary | ICD-10-CM

## 2019-02-16 DIAGNOSIS — I4891 Unspecified atrial fibrillation: Secondary | ICD-10-CM | POA: Diagnosis not present

## 2019-02-16 LAB — POCT INR: INR: 3.3 — AB (ref 2.0–3.0)

## 2019-03-01 ENCOUNTER — Telehealth: Payer: Self-pay | Admitting: *Deleted

## 2019-03-01 MED ORDER — SACUBITRIL-VALSARTAN 24-26 MG PO TABS: 1.0000 | ORAL_TABLET | Freq: Two times a day (BID) | ORAL | 3 refills | Status: DC

## 2019-03-01 NOTE — Telephone Encounter (Signed)

## 2019-03-03 NOTE — Progress Notes (Signed)
Telehealth Visit     Virtual Visit via Video Note   This visit type was conducted due to national recommendations for restrictions regarding the COVID-19 Pandemic (e.g. social distancing) in an effort to limit this patient's exposure and mitigate transmission in our community.  Due to her co-morbid illnesses, this patient is at least at moderate risk for complications without adequate follow up.  This format is felt to be most appropriate for this patient at this time.  All issues noted in this document were discussed and addressed.  A limited physical exam was performed with this format.  Please refer to the patient's chart for her consent to telehealth for [facility].   Evaluation Performed:  Follow-up visit  This visit type was conducted due to national recommendations for restrictions regarding the COVID-19 Pandemic (e.g. social distancing).  This format is felt to be most appropriate for this patient at this time.  All issues noted in this document were discussed and addressed.  No physical exam was performed (except for noted visual exam findings with Video Visits).  Please refer to the patient's chart (MyChart message for video visits and phone note for telephone visits) for the patient's consent to telehealth for [facility].  Date:  03/04/2019   ID:  Natasha Livingston, DOB [birth year], MRN [id number]  Patient Location:  Home  Provider location:   Home  PCP:  Cyndi Bender, PA-C  Cardiologist:  Servando Snare & Candee Furbish, MD  Electrophysiologist:  None   Chief Complaint:  Follow up.   History of Present Illness:    Natasha Livingston is a 85 y.o. female who presents via audio/video conferencing for a telehealth visit today.  Seen for Dr. Marlou Porch.   She has a history of chronic atrial fibrillation, HTN, CAD with remote CABG in 2008 by Dr. Darcey Nora. Other issues include diastolic HF, chronic fatigue, MR due to MVP, hypothyroidism, and HTN. Echocardiogram on 03/24/14 showed an  ejection fraction of 50% which was an improvement from the previous echo of 40%. It was felt that some of this improvement was secondary to improvement in tachycardia mediated cardiomyopathy. She had a negative Myoview stress test on 10/23/14 showing no ischemia and her ejection fraction on that study was 54%.   I saw her back in September of 2018 - she was doing ok - had weaned off Xanax. Neuropathy seemed to be her most limiting factor. She had missed her visit for an echo - family wished to proceed and this showed that her EF was lower - family and Dr. Marlou Porch opted for continued medical management with no plans for further testing other than a repeat echo - when this was done in April of 2019 - EF had improved. She was started on Entresto. Back in August of 2019 she was in atrial flutter with 2:1 conduction with rapid VR - Toprol was increased after discussion with Dr. Curt Bears. She was improved on follow up.    Last visit with me was in November of 2019 - there was concern for low BP - she was asymptomatic. She was sleepy more during the day but also on Neurontin BID. Seemed more limited by orthopedic issues. Last visit with Dr. Marlou Porch in December - had just had a back injection. Her dose of Lasix was reduced due to some persistent low BP readings.   The patient does not have symptoms concerning for COVID-19 infection (fever, chills, cough, or new shortness of breath).   She has consented for this visit. Her  visit today is via [place] video using her son's phone. Her son Gerald Stabs augments the history - he typically comes to her appointments. The other son does her medicines.  She cannot hear well. She has been staying in. BP still little low - but not lightheaded or dizzy. No syncope. Her medicines were pretty mixed up and we got that straightened out. She says her breathing is pretty good. No chest pain. She is more limited by the hip and back pain. Due to the COVID she has not been able to complete  her therapy for her hip that she needed to do prior to getting another injection. INR's running a little higher - probably due to staying at home and cooking more at home. No falls but then says she tripped outside while looking at her flowers about 2 weeks ago- scrapped her leg but otherwise did not get hurt.    Past Medical History:  Diagnosis Date  . Atrial fibrillation ([place])   . Back pain   . Bruises easily   . Chest discomfort   . Diverticulitis   . Dizziness   . DOE (dyspnea on exertion)   . Forgetfulness   . Hx of cardiovascular stress test    Lexiscan Myoview (12/15):  Apical lateral and anterolateral fixed defect, no ischemia, EF 54%; low risk  . Hyperlipidemia   . Hypertension   . Hypothyroidism   . Interstitial nephritis   . Ischemic heart disease   . MVP (mitral valve prolapse)   . Neuropathy    Past Surgical History:  Procedure Laterality Date  . CARDIOVERSION  08/2008  . CORONARY ARTERY BYPASS GRAFT  04/2007  . OVARIAN CYST REMOVAL       Current Meds  Medication Sig  . acetaminophen (TYLENOL) 500 MG tablet Take 500 mg by mouth every 6 (six) hours as needed (For pain.).  Marland Kitchen allopurinol (ZYLOPRIM) 300 MG tablet Take 300 mg by mouth daily.   . colchicine 0.6 MG tablet Take 0.6 mg by mouth daily as needed (For gout flare-ups.).   Marland Kitchen diclofenac sodium (VOLTAREN) 1 % GEL Apply 2 g topically 4 (four) times daily as needed (For pain.).  Marland Kitchen fexofenadine (ALLEGRA) 60 MG tablet Take 60 mg by mouth 2 (two) times daily as needed for allergies or rhinitis.  . furosemide (LASIX) 40 MG tablet Take 60 mg by mouth 2 (two) times daily.  Marland Kitchen gabapentin (NEURONTIN) 800 MG tablet Take 400 mg by mouth 2 (two) times daily. Take one half tablet (400 mg) am take one tablet (800 mg) pm  . hydrocortisone (ANUSOL-HC) 2.5 % rectal cream Place 1 application rectally 2 (two) times daily as needed for hemorrhoids.  Marland Kitchen levothyroxine (SYNTHROID) 25 MCG tablet Take 25 mcg by mouth daily before breakfast.   . metoprolol succinate (TOPROL-XL) 50 MG 24 hr tablet Take 1 tablet (50 mg total) by mouth 2 (two) times daily. Take with or immediately following a meal.  . nitroGLYCERIN (NITROSTAT) 0.4 MG SL tablet Place 1 tablet (0.4 mg total) under the tongue every 5 (five) minutes as needed for chest pain.  . potassium chloride SA (K-DUR) 20 MEQ tablet Take 40 mEq by mouth daily.  . sacubitril-valsartan (ENTRESTO) 24-26 MG Take 1 tablet by mouth 2 (two) times daily.  Marland Kitchen warfarin (COUMADIN) 5 MG tablet TAKE AS DIRECTED BY COUMADIN CLINIC (Patient taking differently: Take 2.5-5 mg by mouth daily. Take one tablet on Monday, Wednesday, Friday and half a tablet the rest of the week.)  . [  DISCONTINUED] oxymetazoline (AFRIN NASAL SPRAY) 0.05 % nasal spray Place 1 spray into right nostril 2 (two) times daily as needed (bleed).     Allergies:   Amiodarone; Crestor [rosuvastatin calcium]; and Lipitor [atorvastatin calcium]   Social History   Tobacco Use  . Smoking status: Never Smoker  . Smokeless tobacco: Never Used  Substance Use Topics  . Alcohol use: No  . Drug use: No     Family Hx: The patient's family history includes Cerebral aneurysm in her father and another family member; Hypertension in her father. There is no history of Heart attack or Stroke.  ROS:   Please see the history of present illness.   All other systems reviewed are negative.    Objective:    Vital Signs:  BP 100/71   Pulse 84   Ht 5' 6.5" (1.689 m)   Wt 146 lb 9.6 oz (66.5 kg)   BMI 23.31 kg/m    Wt Readings from Last 3 Encounters:  03/04/19 146 lb 9.6 oz (66.5 kg)  10/28/18 142 lb (64.4 kg)  10/22/18 148 lb 12.8 oz (67.5 kg)    Alert female in no acute distress. She seems chronically ill. She is hard of hearing.    Labs/Other Tests and Data Reviewed:    Lab Results  Component Value Date   WBC 7.4 06/15/2018   HGB 13.3 10/28/2018   HCT 39.0 10/28/2018   PLT 216 06/15/2018   GLUCOSE 136 (H) 10/28/2018   CHOL  160 02/01/2016   TRIG 185 (H) 02/01/2016   HDL 29 (L) 02/01/2016   LDLCALC 94 02/01/2016   ALT 16 02/01/2016   AST 29 02/01/2016   NA 138 10/28/2018   K 4.1 10/28/2018   CL 103 10/28/2018   CREATININE 0.90 10/28/2018   BUN 36 (H) 10/28/2018   CO2 27 06/15/2018   TSH 1.80 10/10/2014   INR 3.3 (A) 02/16/2019   HGBA1C (H) 04/08/2007    6.9 (NOTE)   The ADA recommends the following therapeutic goals for glycemic   control related to Hgb A1C measurement:   Goal of Therapy:   < 7.0% Hgb A1C   Action Suggested:  > 8.0% Hgb A1C   Ref:  Diabetes Care, 22, Suppl. 1, 1999     Lab Results  Component Value Date   INR 3.3 (A) 02/16/2019   INR 3.5 (A) 01/26/2019   INR 2.5 12/27/2018    BNP (last 3 results) No results for input(s): BNP in the last 8760 hours.  ProBNP (last 3 results) No results for input(s): PROBNP in the last 8760 hours.    Prior CV studies:    The following studies were reviewed today:  04/07/18: ECHO  - Left ventricle: Inferobasal and posterior lateral hypokinesis worse. Wall thickness was increased in a pattern of mild LVH. Systolic function was moderately reduced. The estimated ejection fraction was in the range of 35% to 40%. Left ventricular diastolic function parameters were normal. - Aortic valve: There was trivial regurgitation. - Mitral valve: Moderately calcified annulus. Moderately thickened, moderately calcified leaflets . There was mild regurgitation. - Left atrium: The atrium was severely dilated. - Right ventricle: The cavity size was mildly dilated. - Right atrium: The atrium was severely dilated. - Atrial septum: No defect or patent foramen ovale was identified. - Tricuspid valve: There was severe regurgitation.  Echo 08/18/17:  - Left ventricle: The cavity size was normal. Wall thickness was normal. Systolic function was severely reduced. The estimated ejection fraction was in  the range of 25% to 30%. - Aortic valve:  There was trivial regurgitation. - Mitral valve: Mildly calcified annulus. Prolapse, involving the anterior leaflet. There was mild regurgitation. - Left atrium: The atrium was moderately to severely dilated. - Right ventricle: The cavity size was severely dilated. Systolic function was mildly reduced. - Right atrium: The atrium was moderately to severely dilated. - Tricuspid valve: There was moderate-severe regurgitation. - Pulmonary arteries: Systolic pressure was mildly increased. PA peak pressure: 35 mm Hg (S).   Echocardiogram 03/24/14:  - Left ventricle: The cavity size was normal. The estimated ejection fraction was 50%. There is hypokinesis of the mid-apicalanterolateral myocardium. There is hypokinesis of the mid-apicalinferolateral myocardium. - Mitral valve: Mild calcification, with mild involvement of chords. There was mild to moderate regurgitation. - Left atrium: The atrium was moderately dilated. - Right ventricle: The cavity size was mildly dilated. Wall thickness was normal. - Right atrium: The atrium was moderately to severely dilated. - Tricuspid valve: There was moderate regurgitation. - Pulmonary arteries: PA peak pressure: 52 mm Hg (S).  Impressions:  - The right ventricular systolic pressure was increased consistent with moderate pulmonary hypertension.     ASSESSMENT & PLAN:    1. Prior episodes of asymptomatic hypotension - on less Lasix. BP still on the low side - son does not feel her cuff is accurate (says its old) - he is going to check her BP with his - he will monitor over the next week or so. She is not symptomatic.   2. Chronic systolic & diastolic HF - no extra Lasix. Trying to restrict salt. Not short of breath.   3. Persistent AF - has had prior atrial flutter as well - she remains on beta blocker therapy. HR is ok today.   4. Chronic anticoagulation - running a little higher but may be due to change in eating with the  stay at home order.   5. CAD with prior CABG - no active chest pain.   6. HLD - statin intolerant. Not discussed today.   7. Neuropathy - on chronic Neurontin - still limited by her back/hip pain.   8. Secondary pulmonary HTN  9. COVID-19 Education: The signs and symptoms of COVID-19 were discussed with the patient and how to seek care for testing (follow up with PCP or arrange E-visit).  The importance of social distancing, staying at home, hand hygiene and wearing a mask when out in public were discussed today.  Patient Risk:   After full review of this patient's clinical status, I feel that they are at least moderate risk at this time.  Time:   Today, I have spent 13 minutes with the patient with telehealth technology discussing the above issues.     Medication Adjustments/Labs and Tests Ordered: Current medicines are reviewed at length with the patient today.  Concerns regarding medicines are outlined above.   Tests Ordered: No orders of the defined types were placed in this encounter.   Medication Changes: No orders of the defined types were placed in this encounter.   Disposition:  FU with Dr. Marlou Porch in August.   Patient is agreeable to this plan and will call if any problems develop in the interim.   Amie Critchley, NP  03/04/2019 1:57 PM    [facility]

## 2019-03-04 ENCOUNTER — Encounter: Payer: Self-pay | Admitting: Nurse Practitioner

## 2019-03-04 ENCOUNTER — Telehealth (INDEPENDENT_AMBULATORY_CARE_PROVIDER_SITE_OTHER): Payer: Medicare Other | Admitting: Nurse Practitioner

## 2019-03-04 ENCOUNTER — Other Ambulatory Visit: Payer: Self-pay

## 2019-03-04 VITALS — BP 100/71 | HR 84 | Ht 66.5 in | Wt 146.6 lb

## 2019-03-04 DIAGNOSIS — E7800 Pure hypercholesterolemia, unspecified: Secondary | ICD-10-CM

## 2019-03-04 DIAGNOSIS — I341 Nonrheumatic mitral (valve) prolapse: Secondary | ICD-10-CM

## 2019-03-04 DIAGNOSIS — R0600 Dyspnea, unspecified: Secondary | ICD-10-CM

## 2019-03-04 DIAGNOSIS — I5032 Chronic diastolic (congestive) heart failure: Secondary | ICD-10-CM

## 2019-03-04 DIAGNOSIS — Z7189 Other specified counseling: Secondary | ICD-10-CM

## 2019-03-04 DIAGNOSIS — I2510 Atherosclerotic heart disease of native coronary artery without angina pectoris: Secondary | ICD-10-CM | POA: Diagnosis not present

## 2019-03-04 DIAGNOSIS — R0609 Other forms of dyspnea: Secondary | ICD-10-CM

## 2019-03-04 DIAGNOSIS — I5022 Chronic systolic (congestive) heart failure: Secondary | ICD-10-CM

## 2019-03-04 NOTE — Patient Instructions (Addendum)
After Visit Summary:  We will be checking the following labs today - NONE    Medication Instructions:    Continue with your current medicines.    If you need a refill on your cardiac medications before your next appointment, please call your pharmacy.     Testing/Procedures To Be Arranged:  N/A  Follow-Up:   See Dr. Marlou Porch in August    At [facility], you and your health needs are our priority.  As part of our continuing mission to provide you with exceptional heart care, we have created designated Provider Care Teams.  These Care Teams include your primary Cardiologist (physician) and Advanced Practice Providers (APPs -  Physician Assistants and Nurse Practitioners) who all work together to provide you with the care you need, when you need it.  Special Instructions:  . Stay safe, stay home, wash your hands for at least 20 seconds and wear a mask when out in public.  . It was good to talk with you today.  Antonieta Pert check your BP with his cuff for the next week or so - call us if needed   Call the [place] office at [phone] if you have any questions, problems or concerns.

## 2019-03-07 ENCOUNTER — Telehealth: Payer: Self-pay

## 2019-03-07 NOTE — Telephone Encounter (Signed)

## 2019-03-09 ENCOUNTER — Ambulatory Visit (INDEPENDENT_AMBULATORY_CARE_PROVIDER_SITE_OTHER): Payer: Medicare Other

## 2019-03-09 ENCOUNTER — Other Ambulatory Visit: Payer: Self-pay

## 2019-03-09 DIAGNOSIS — I4891 Unspecified atrial fibrillation: Secondary | ICD-10-CM

## 2019-03-09 DIAGNOSIS — Z5181 Encounter for therapeutic drug level monitoring: Secondary | ICD-10-CM | POA: Diagnosis not present

## 2019-03-09 LAB — POCT INR: INR: 2.1 (ref 2.0–3.0)

## 2019-03-09 NOTE — Patient Instructions (Signed)
Description   Called spoke with pt's son Richardson Landry, advised to have pt continue on same dosage 1 tablet daily except 1/2 tablet on Tuesdays, Thursdays and Saturdays. Recheck INR in 4 weeks.  Keep your normal green intake. Call our office if you have any questions or unusual bleeding [phone].

## 2019-03-16 ENCOUNTER — Telehealth: Payer: Self-pay | Admitting: Nurse Practitioner

## 2019-03-16 NOTE — Telephone Encounter (Signed)
°  STAT if patient feels like he/she is going to faint   1) Are you dizzy now? No   2) Do you feel faint or have you passed out? No   3) Do you have any other symptoms? Dizzy.  4) Have you checked your HR and BP (record if available)? No

## 2019-03-16 NOTE — Telephone Encounter (Signed)
Called patient's son back about his message. Patient's son stated patient was having dizziness here and there over the weekend, but has been fine this week. He stated that patient has some pain in her hip that has not been treated recently with shots due to the [place]. Patient's son seems to think the two are related. Asked him how patient's BP and HR are, but he does not know. Encouraged him to have patient take her BP and HR daily to see if there is a correlation with the dizziness. Patient's son stated patient has a CNA who is with her during the week and he will start having her take her BP and HR daily. Encouraged patient's son to give our office a call if BP and HR is low. Patient's son verbalized understanding and he will give our office a call if he has any other concerns.

## 2019-03-22 ENCOUNTER — Other Ambulatory Visit: Payer: Self-pay | Admitting: Cardiology

## 2019-04-04 ENCOUNTER — Telehealth: Payer: Self-pay

## 2019-04-04 NOTE — Telephone Encounter (Signed)
lmom for prescreen  

## 2019-04-05 NOTE — Telephone Encounter (Signed)

## 2019-04-06 ENCOUNTER — Ambulatory Visit (INDEPENDENT_AMBULATORY_CARE_PROVIDER_SITE_OTHER): Payer: Medicare Other | Admitting: *Deleted

## 2019-04-06 ENCOUNTER — Other Ambulatory Visit: Payer: Self-pay

## 2019-04-06 DIAGNOSIS — Z5181 Encounter for therapeutic drug level monitoring: Secondary | ICD-10-CM

## 2019-04-06 DIAGNOSIS — I4891 Unspecified atrial fibrillation: Secondary | ICD-10-CM

## 2019-04-06 LAB — POCT INR: INR: 4.5 — AB (ref 2.0–3.0)

## 2019-04-06 NOTE — Patient Instructions (Signed)
Description   Do not take any Coumadin tomorrow and No Coumadin Friday then cntinue on same dosage 1 tablet daily except 1/2 tablet on Tuesdays, Thursdays and Saturdays. Recheck INR in 2 weeks.  Keep your normal green intake. Call our office if you have any questions or unusual bleeding [phone].

## 2019-04-14 ENCOUNTER — Telehealth: Payer: Self-pay

## 2019-04-14 NOTE — Telephone Encounter (Signed)

## 2019-04-20 ENCOUNTER — Other Ambulatory Visit: Payer: Self-pay

## 2019-04-20 ENCOUNTER — Ambulatory Visit (INDEPENDENT_AMBULATORY_CARE_PROVIDER_SITE_OTHER): Payer: Medicare Other | Admitting: *Deleted

## 2019-04-20 DIAGNOSIS — Z5181 Encounter for therapeutic drug level monitoring: Secondary | ICD-10-CM

## 2019-04-20 DIAGNOSIS — I4891 Unspecified atrial fibrillation: Secondary | ICD-10-CM | POA: Diagnosis not present

## 2019-04-20 LAB — POCT INR: INR: 2.3 (ref 2.0–3.0)

## 2019-04-20 NOTE — Patient Instructions (Signed)
Description   Continue on same dosage 1 tablet daily except 1/2 tablet on Tuesdays, Thursdays and Saturdays. Recheck INR in 3 weeks.  Keep your normal green intake. Call our office if you have any questions or unusual bleeding [phone].

## 2019-04-30 ENCOUNTER — Other Ambulatory Visit: Payer: Self-pay | Admitting: Cardiology

## 2019-05-02 ENCOUNTER — Encounter (HOSPITAL_COMMUNITY): Payer: Self-pay

## 2019-05-02 ENCOUNTER — Other Ambulatory Visit: Payer: Self-pay

## 2019-05-02 ENCOUNTER — Emergency Department (HOSPITAL_COMMUNITY)
Admission: EM | Admit: 2019-05-02 | Discharge: 2019-05-03 | Disposition: A | Discharge status: Home / Self Care | Payer: Medicare Other | Attending: Emergency Medicine | Admitting: Emergency Medicine

## 2019-05-02 DIAGNOSIS — Z7901 Long term (current) use of anticoagulants: Secondary | ICD-10-CM | POA: Diagnosis not present

## 2019-05-02 DIAGNOSIS — Z79899 Other long term (current) drug therapy: Secondary | ICD-10-CM | POA: Insufficient documentation

## 2019-05-02 DIAGNOSIS — I10 Essential (primary) hypertension: Secondary | ICD-10-CM | POA: Insufficient documentation

## 2019-05-02 DIAGNOSIS — R000 Tachycardia, unspecified: Secondary | ICD-10-CM | POA: Diagnosis not present

## 2019-05-02 DIAGNOSIS — R040 Epistaxis: Secondary | ICD-10-CM | POA: Diagnosis not present

## 2019-05-02 DIAGNOSIS — E039 Hypothyroidism, unspecified: Secondary | ICD-10-CM | POA: Insufficient documentation

## 2019-05-02 DIAGNOSIS — R42 Dizziness and giddiness: Secondary | ICD-10-CM | POA: Diagnosis not present

## 2019-05-02 DIAGNOSIS — R58 Hemorrhage, not elsewhere classified: Secondary | ICD-10-CM | POA: Diagnosis not present

## 2019-05-02 LAB — BASIC METABOLIC PANEL
Anion gap: 12 (ref 5–15)
BUN: 22 mg/dL (ref 8–23)
CO2: 22 mmol/L (ref 22–32)
Calcium: 9.1 mg/dL (ref 8.9–10.3)
Chloride: 107 mmol/L (ref 98–111)
Creatinine, Ser: 0.93 mg/dL (ref 0.44–1.00)
GFR calc Af Amer: >60 mL/min (ref >60)
GFR calc non Af Amer: 57 mL/min — ABNORMAL LOW (ref >60)
Glucose, Bld: 207 mg/dL — ABNORMAL HIGH (ref 70–99)
Potassium: 3.8 mmol/L (ref 3.5–5.1)
Sodium: 141 mmol/L (ref 135–145)

## 2019-05-02 LAB — CBC
HCT: 37.5 % (ref 36.0–46.0)
Hemoglobin: 12.2 g/dL (ref 12.0–15.0)
MCH: 33.6 pg (ref 26.0–34.0)
MCHC: 32.5 g/dL (ref 30.0–36.0)
MCV: 103.3 fL — ABNORMAL HIGH (ref 80.0–100.0)
Platelets: 201 10*3/uL (ref 150–400)
RBC: 3.63 MIL/uL — ABNORMAL LOW (ref 3.87–5.11)
RDW: 14.1 % (ref 11.5–15.5)
WBC: 15.3 10*3/uL — ABNORMAL HIGH (ref 4.0–10.5)
nRBC: 0 % (ref 0.0–0.2)

## 2019-05-02 LAB — PROTIME-INR
INR: 3.3 — ABNORMAL HIGH (ref 0.8–1.2)
Prothrombin Time: 32.8 seconds — ABNORMAL HIGH (ref 11.4–15.2)

## 2019-05-02 MED ORDER — TRANEXAMIC ACID 1000 MG/10ML IV SOLN
500.0000 mg | Freq: Once | INTRAVENOUS | Status: AC
  Administered 2019-05-02: 500 mg via TOPICAL
  Filled 2019-05-02: qty 10

## 2019-05-02 MED ORDER — SODIUM CHLORIDE 0.9 % IV BOLUS
500.0000 mL | Freq: Once | INTRAVENOUS | Status: AC
  Administered 2019-05-02: 500 mL via INTRAVENOUS

## 2019-05-02 NOTE — ED Provider Notes (Signed)
[facility] DEPT Provider Note  CSN: [id number] Arrival date & time: 05/02/19 2213  Chief Complaint(s) Epistaxis  HPI Natasha Livingston is a 85 y.o. female with a history of atrial fibrillation on Coumadin who presents to the emergency department with right-sided epistaxis that began 5 hours prior to arrival.  Attempts at applied pressure and Afrin were unsuccessful.  No headache, shortness of breath, nausea, vomiting.  No chest pain.  No abdominal pain.  No other physical complaints.  HPI  Past Medical History Past Medical History:  Diagnosis Date  . Atrial fibrillation ([place])   . Back pain   . Bruises easily   . Chest discomfort   . Diverticulitis   . Dizziness   . DOE (dyspnea on exertion)   . Forgetfulness   . Hx of cardiovascular stress test    Lexiscan Myoview (12/15):  Apical lateral and anterolateral fixed defect, no ischemia, EF 54%; low risk  . Hyperlipidemia   . Hypertension   . Hypothyroidism   . Interstitial nephritis   . Ischemic heart disease   . MVP (mitral valve prolapse)   . Neuropathy    Patient Active Problem List   Diagnosis Date Noted  . Onychomycosis of left great toe 09/10/2015  . Encounter for therapeutic drug monitoring 12/07/2013  . Depression 12/07/2013  . Paroxysmal atrial fibrillation ([place]) 08/18/2013  . Hypothyroid 04/20/2013  . Dermatitis 12/31/2012  . Benign hypertensive heart disease without heart failure 12/24/2011  . Long term (current) use of anticoagulants 08/27/2011  . Edema of foot 04/28/2011  . Malaise and fatigue 04/15/2011  . Chest discomfort   . Dizziness   . MVP (mitral valve prolapse)   . Dyspnea   . Bruises easily   . Back pain   . Forgetfulness   . Atrial fibrillation ([place])   . Hypertension   . Hypothyroidism   . Aortic stenosis   . Hyperlipidemia   . Diverticulitis   . Interstitial nephritis   . Neuropathy   . Ischemic heart disease   . Atrial fibrillation ([place]) 02/03/2011    Home Medication(s) Prior to Admission medications   Medication Sig Start Date End Date Taking? Authorizing Provider  acetaminophen (TYLENOL) 500 MG tablet Take 500 mg by mouth every 6 (six) hours as needed (For pain.).   Yes [provider]  allopurinol (ZYLOPRIM) 300 MG tablet Take 300 mg by mouth daily.  01/03/18  Yes [provider]  colchicine 0.6 MG tablet Take 0.6 mg by mouth daily as needed (For gout flare-ups.).    Yes [provider]  diclofenac sodium (VOLTAREN) 1 % GEL Apply 2 g topically 4 (four) times daily as needed (For pain.).   Yes [provider]  ENTRESTO 24-26 MG TAKE 1 TABLET BY MOUTH TWICE A DAY Patient taking differently: Take 1 tablet by mouth 2 (two) times a day.  05/02/19  Yes Jerline Pain, MD  fexofenadine (ALLEGRA) 60 MG tablet Take 60 mg by mouth 2 (two) times daily as needed for allergies or rhinitis.   Yes [provider]  furosemide (LASIX) 40 MG tablet Take 60 mg by mouth 2 (two) times daily.   Yes [provider]  gabapentin (NEURONTIN) 800 MG tablet Take 400-800 mg by mouth See admin instructions. Take one half tablet (400 mg) am take one tablet (800 mg) pm   Yes [provider]  hydrocortisone (ANUSOL-HC) 2.5 % rectal cream Place 1 application rectally 2 (two) times daily as needed for hemorrhoids.  Yes [provider]  levothyroxine (SYNTHROID) 25 MCG tablet Take 25 mcg by mouth daily before breakfast.   Yes [provider]  metoprolol succinate (TOPROL-XL) 50 MG 24 hr tablet Take 1 tablet (50 mg total) by mouth 2 (two) times daily. Take with or immediately following a meal. 06/15/18 05/02/19 Yes Gerhardt, Marlane Hatcher, NP  nitroGLYCERIN (NITROSTAT) 0.4 MG SL tablet Place 1 tablet (0.4 mg total) under the tongue every 5 (five) minutes as needed for chest pain. 03/17/17  Yes Jerline Pain, MD  potassium chloride SA (K-DUR) 20 MEQ tablet Take 40 mEq by mouth 2 (two) times daily.    Yes  [provider]  warfarin (COUMADIN) 5 MG tablet TAKE AS DIRECTED BY COUMADIN CLINIC Patient taking differently: Take 2.5-5 mg by mouth See admin instructions. 5 mg Sun, Mon, Wed, Fri 2.5 mg Cain Saupe, Sat 03/22/19  Yes Jerline Pain, MD                                                                                                                                    Past Surgical History Past Surgical History:  Procedure Laterality Date  . CARDIOVERSION  08/2008  . CORONARY ARTERY BYPASS GRAFT  04/2007  . OVARIAN CYST REMOVAL     Family History Family History  Problem Relation Age of Onset  . Cerebral aneurysm Father   . Hypertension Father   . Cerebral aneurysm Other   . Heart attack Neg Hx   . Stroke Neg Hx     Social History Social History   Tobacco Use  . Smoking status: Never Smoker  . Smokeless tobacco: Never Used  Substance Use Topics  . Alcohol use: No  . Drug use: No   Allergies Amiodarone, Crestor [rosuvastatin calcium], and Lipitor [atorvastatin calcium]  Review of Systems Review of Systems All other systems are reviewed and are negative for acute change except as noted in the HPI  Physical Exam Vital Signs  I have reviewed the triage vital signs BP (!) 145/91   Pulse (!) 106   Temp 98 F (36.7 C) (Oral)   Resp 12   Ht 5\' 7"  (1.702 m)   Wt 69.9 kg   SpO2 94%   BMI 24.12 kg/m   Physical Exam Vitals signs reviewed.  Constitutional:      General: She is not in acute distress.    Appearance: She is well-developed. She is not diaphoretic.  HENT:     Head: Normocephalic and atraumatic.     Right Ear: External ear normal.     Left Ear: External ear normal.     Nose:     Right Nostril: Epistaxis present.     Left Nostril: No epistaxis.     Mouth/Throat:     Comments: Blood in posterior oropharynx  Eyes:     General: No scleral icterus.    Conjunctiva/sclera: Conjunctivae normal.  Neck:  Musculoskeletal: Normal range of motion.      Trachea: Phonation normal.  Cardiovascular:     Rate and Rhythm: Normal rate and regular rhythm.  Pulmonary:     Effort: Pulmonary effort is normal. No respiratory distress.     Breath sounds: No stridor.  Abdominal:     General: There is no distension.  Musculoskeletal: Normal range of motion.  Neurological:     Mental Status: She is alert and oriented to person, place, and time.  Psychiatric:        Behavior: Behavior normal.     ED Results and Treatments Labs (all labs ordered are listed, but only abnormal results are displayed) Labs Reviewed  CBC - Abnormal; Notable for the following components:      Result Value   WBC 15.3 (*)    RBC 3.63 (*)    MCV 103.3 (*)    All other components within normal limits  BASIC METABOLIC PANEL - Abnormal; Notable for the following components:   Glucose, Bld 207 (*)    GFR calc non Af Amer 57 (*)    All other components within normal limits  PROTIME-INR - Abnormal; Notable for the following components:   Prothrombin Time 32.8 (*)    INR 3.3 (*)    All other components within normal limits                                                                                                                         EKG  EKG Interpretation  Date/Time:    Ventricular Rate:    PR Interval:    QRS Duration:   QT Interval:    QTC Calculation:   R Axis:     Text Interpretation:        Radiology No results found.  Pertinent labs & imaging results that were available during my care of the patient were reviewed by me and considered in my medical decision making (see chart for details).  Medications Ordered in ED Medications  acetaminophen (TYLENOL) tablet 1,000 mg (has no administration in time range)  sodium chloride 0.9 % bolus 500 mL (0 mLs Intravenous Stopped 05/03/19 0040)  tranexamic acid (CYKLOKAPRON) injection 500 mg (500 mg Topical Given 05/02/19 2356)  Procedures .Epistaxis Management  Date/Time: 05/03/2019 2:56 AM Performed by: Fatima Blank, MD Authorized by: Fatima Blank, MD   Consent:    Consent obtained:  Verbal   Consent given by:  Patient   Risks discussed:  Bleeding Procedure details:    Treatment site:  Unable to specify   Repair method: direct pressure and TXA neb.   Treatment complexity:  Limited   Treatment episode: recurring   Post-procedure details:    Assessment:  Bleeding stopped   Patient tolerance of procedure:  Tolerated well, no immediate complications    (including critical care time)  Medical Decision Making / ED Course I have reviewed the nursing notes for this encounter and the patient's prior records (if available in EHR or on provided paperwork).  Right nare epistaxis.  TXA neb and pressure applied resulting in hemostasis.  Patient was monitored for 2 hours without rebleed.  Screening labs obtain and hemoglobin stable.  INR 3.3.  The patient appears reasonably screened and/or stabilized for discharge and I doubt any other medical condition or other [facility] requiring further screening, evaluation, or treatment in the ED at this time prior to discharge.  The patient is safe for discharge with strict return precautions.  Elberta Spaniel Owen was evaluated in Emergency Department on 05/03/2019 for the symptoms described in the history of present illness. She was evaluated in the context of the global COVID-19 pandemic, which necessitated consideration that the patient might be at risk for infection with the SARS-CoV-2 virus that causes COVID-19. Institutional protocols and algorithms that pertain to the evaluation of patients at risk for COVID-19 are in a state of rapid change based on information released by regulatory bodies including the CDC and federal and state organizations. These policies and algorithms were followed during the patient's care  in the ED.       Final Clinical Impression(s) / ED Diagnoses Final diagnoses:  Right-sided epistaxis    The patient appears reasonably screened and/or stabilized for discharge and I doubt any other medical condition or other [facility] requiring further screening, evaluation, or treatment in the ED at this time prior to discharge.  Disposition: Discharge  Condition: Good  I have discussed the results, Dx and Tx plan with the patient who expressed understanding and agree(s) with the plan. Discharge instructions discussed at great length. The patient was given strict return precautions who verbalized understanding of the instructions. No further questions at time of discharge.    ED Discharge Orders    None        Follow Up: Cyndi Bender, PA-C [place] [address] [phone]  Schedule an appointment as soon as possible for a visit  As needed  [address], [address] [place] [address] [phone]  Schedule an appointment as soon as possible for a visit  As needed     This chart was dictated using voice recognition software.  Despite best efforts to proofread,  errors can occur which can change the documentation meaning.   Fatima Blank, MD 05/03/19 [phone]

## 2019-05-02 NOTE — ED Notes (Signed)
Bleeding controlled.

## 2019-05-02 NOTE — ED Triage Notes (Addendum)
Per [facility]: Pt coming from home c/o nosebleed that has been ongoing for 5 hours with consistent blood loss. Pt is on blood thinners. This is the third time this has happened. No c/o of headache or loc   Ambulates with a cane at baseline   Afrin given by EMS with no relief.    18 R wrist

## 2019-05-02 NOTE — ED Notes (Signed)
Bed: RESB Expected date:  Expected time:  Means of arrival:  Comments: 85 yo F/ Epistaxis

## 2019-05-03 ENCOUNTER — Telehealth: Payer: Self-pay

## 2019-05-03 MED ORDER — ACETAMINOPHEN 500 MG PO TABS
1000.0000 mg | ORAL_TABLET | Freq: Once | ORAL | Status: AC
  Administered 2019-05-03: 1000 mg via ORAL
  Filled 2019-05-03: qty 2

## 2019-05-03 NOTE — Telephone Encounter (Signed)

## 2019-05-03 NOTE — ED Notes (Signed)
Son in lobby and updated on plan of care. [phone]  Son will be in car waiting for pt

## 2019-05-03 NOTE — ED Notes (Signed)
Pt and son verbalized discharge instructions and follow up care. Alert and ambulatory. Assisted into son's car who is driving her home

## 2019-05-03 NOTE — Discharge Instructions (Addendum)
Please return for uncontrolled bleeding.

## 2019-05-09 ENCOUNTER — Other Ambulatory Visit: Payer: Self-pay | Admitting: Cardiology

## 2019-05-10 DIAGNOSIS — I4891 Unspecified atrial fibrillation: Secondary | ICD-10-CM | POA: Diagnosis not present

## 2019-05-14 ENCOUNTER — Encounter (HOSPITAL_COMMUNITY): Payer: Self-pay | Admitting: Emergency Medicine

## 2019-05-14 ENCOUNTER — Other Ambulatory Visit: Payer: Self-pay | Admitting: Cardiology

## 2019-05-14 ENCOUNTER — Other Ambulatory Visit: Payer: Self-pay

## 2019-05-14 ENCOUNTER — Emergency Department (HOSPITAL_COMMUNITY)
Admission: EM | Admit: 2019-05-14 | Discharge: 2019-05-14 | Disposition: A | Discharge status: Home / Self Care | Payer: Medicare Other | Attending: Emergency Medicine | Admitting: Emergency Medicine

## 2019-05-14 DIAGNOSIS — Z951 Presence of aortocoronary bypass graft: Secondary | ICD-10-CM | POA: Insufficient documentation

## 2019-05-14 DIAGNOSIS — E039 Hypothyroidism, unspecified: Secondary | ICD-10-CM | POA: Insufficient documentation

## 2019-05-14 DIAGNOSIS — J340 Abscess, furuncle and carbuncle of nose: Secondary | ICD-10-CM | POA: Diagnosis not present

## 2019-05-14 DIAGNOSIS — R040 Epistaxis: Secondary | ICD-10-CM

## 2019-05-14 DIAGNOSIS — Z7901 Long term (current) use of anticoagulants: Secondary | ICD-10-CM | POA: Diagnosis not present

## 2019-05-14 DIAGNOSIS — I10 Essential (primary) hypertension: Secondary | ICD-10-CM | POA: Insufficient documentation

## 2019-05-14 DIAGNOSIS — Z79899 Other long term (current) drug therapy: Secondary | ICD-10-CM | POA: Diagnosis not present

## 2019-05-14 LAB — PROTIME-INR
INR: 3.1 — ABNORMAL HIGH (ref 0.8–1.2)
Prothrombin Time: 31.5 seconds — ABNORMAL HIGH (ref 11.4–15.2)

## 2019-05-14 LAB — CBC
HCT: 33.2 % — ABNORMAL LOW (ref 36.0–46.0)
Hemoglobin: 10.7 g/dL — ABNORMAL LOW (ref 12.0–15.0)
MCH: 32.9 pg (ref 26.0–34.0)
MCHC: 32.2 g/dL (ref 30.0–36.0)
MCV: 102.2 fL — ABNORMAL HIGH (ref 80.0–100.0)
Platelets: 258 10*3/uL (ref 150–400)
RBC: 3.25 MIL/uL — ABNORMAL LOW (ref 3.87–5.11)
RDW: 14.6 % (ref 11.5–15.5)
WBC: 8.7 10*3/uL (ref 4.0–10.5)
nRBC: 0 % (ref 0.0–0.2)

## 2019-05-14 MED ORDER — OXYMETAZOLINE HCL 0.05 % NA SOLN
1.0000 | Freq: Once | NASAL | Status: AC
  Administered 2019-05-14: 1 via NASAL
  Filled 2019-05-14: qty 30

## 2019-05-14 NOTE — Discharge Instructions (Addendum)
If bleeding occurs again blow out all blood clots, spray Afrin upper nose and hold pressure for 15 minutes.  Repeat twice.

## 2019-05-14 NOTE — ED Notes (Signed)
lav LT green green and two red sent to lab

## 2019-05-14 NOTE — ED Triage Notes (Signed)
Patient BIB [facility] for nosebleed lasting 20-30 minutes. Bleeding controled at this time. Pt is on coumadin. Last seen for same 2 weeks ago. Hx a-fib and HTN. Pt a/o x4. Ambulates with assistance.

## 2019-05-14 NOTE — ED Provider Notes (Signed)
[facility] DEPT Provider Note   CSN: [id number] Arrival date & time: 05/14/19  2024    History   Chief Complaint Chief Complaint  Patient presents with  . Epistaxis    on bld thinners    HPI Natasha Livingston is a 85 y.o. female.     The history is provided by the patient.  Epistaxis Location:  R nare Severity:  Mild Progression:  Resolved Chronicity:  New Context: anticoagulants   Relieved by:  Applying pressure and vasoconstrictors Worsened by:  Nothing Associated symptoms: no congestion, no cough, no fever and no sore throat   Risk factors: frequent nosebleeds     Past Medical History:  Diagnosis Date  . Atrial fibrillation ([place])   . Back pain   . Bruises easily   . Chest discomfort   . Diverticulitis   . Dizziness   . DOE (dyspnea on exertion)   . Forgetfulness   . Hx of cardiovascular stress test    Lexiscan Myoview (12/15):  Apical lateral and anterolateral fixed defect, no ischemia, EF 54%; low risk  . Hyperlipidemia   . Hypertension   . Hypothyroidism   . Interstitial nephritis   . Ischemic heart disease   . MVP (mitral valve prolapse)   . Neuropathy     Patient Active Problem List   Diagnosis Date Noted  . Onychomycosis of left great toe 09/10/2015  . Encounter for therapeutic drug monitoring 12/07/2013  . Depression 12/07/2013  . Paroxysmal atrial fibrillation ([place]) 08/18/2013  . Hypothyroid 04/20/2013  . Dermatitis 12/31/2012  . Benign hypertensive heart disease without heart failure 12/24/2011  . Long term (current) use of anticoagulants 08/27/2011  . Edema of foot 04/28/2011  . Malaise and fatigue 04/15/2011  . Chest discomfort   . Dizziness   . MVP (mitral valve prolapse)   . Dyspnea   . Bruises easily   . Back pain   . Forgetfulness   . Atrial fibrillation ([place])   . Hypertension   . Hypothyroidism   . Aortic stenosis   . Hyperlipidemia   . Diverticulitis   . Interstitial nephritis   .  Neuropathy   . Ischemic heart disease   . Atrial fibrillation ([place]) 02/03/2011    Past Surgical History:  Procedure Laterality Date  . CARDIOVERSION  08/2008  . CORONARY ARTERY BYPASS GRAFT  04/2007  . OVARIAN CYST REMOVAL       OB History   No obstetric history on file.      Home Medications    Prior to Admission medications   Medication Sig Start Date End Date Taking? Authorizing Provider  acetaminophen (TYLENOL) 500 MG tablet Take 500 mg by mouth every 6 (six) hours as needed (For pain.).    [provider]  allopurinol (ZYLOPRIM) 300 MG tablet Take 300 mg by mouth daily.  01/03/18   [provider]  colchicine 0.6 MG tablet Take 0.6 mg by mouth daily as needed (For gout flare-ups.).     [provider]  diclofenac sodium (VOLTAREN) 1 % GEL Apply 2 g topically 4 (four) times daily as needed (For pain.).    [provider]  ENTRESTO 24-26 MG TAKE 1 TABLET BY MOUTH TWICE A DAY Patient taking differently: Take 1 tablet by mouth 2 (two) times a day.  05/02/19   Jerline Pain, MD  fexofenadine (ALLEGRA) 60 MG tablet Take 60 mg by mouth 2 (two) times daily as needed for allergies or rhinitis.  [provider]  furosemide (LASIX) 40 MG tablet Take 60 mg by mouth 2 (two) times daily.    [provider]  gabapentin (NEURONTIN) 800 MG tablet Take 400-800 mg by mouth See admin instructions. Take one half tablet (400 mg) am take one tablet (800 mg) pm    [provider]  hydrocortisone (ANUSOL-HC) 2.5 % rectal cream Place 1 application rectally 2 (two) times daily as needed for hemorrhoids.    [provider]  KLOR-CON M20 20 MEQ tablet TAKE 2 TABLETS BY MOUTH EVERY DAY 05/09/19   Burtis Junes, NP  levothyroxine (SYNTHROID) 25 MCG tablet Take 25 mcg by mouth daily before breakfast.    [provider]  metoprolol succinate (TOPROL-XL) 50 MG 24 hr tablet Take 1 tablet (50 mg total) by mouth 2 (two) times daily.  Take with or immediately following a meal. 06/15/18 05/02/19  Burtis Junes, NP  nitroGLYCERIN (NITROSTAT) 0.4 MG SL tablet Place 1 tablet (0.4 mg total) under the tongue every 5 (five) minutes as needed for chest pain. 03/17/17   Jerline Pain, MD  warfarin (COUMADIN) 5 MG tablet TAKE AS DIRECTED BY COUMADIN CLINIC Patient taking differently: Take 2.5-5 mg by mouth See admin instructions. 5 mg Sun, Mon, Wed, Fri 2.5 mg Cain Saupe, Sat 03/22/19   Jerline Pain, MD    Family History Family History  Problem Relation Age of Onset  . Cerebral aneurysm Father   . Hypertension Father   . Cerebral aneurysm Other   . Heart attack Neg Hx   . Stroke Neg Hx     Social History Social History   Tobacco Use  . Smoking status: Never Smoker  . Smokeless tobacco: Never Used  Substance Use Topics  . Alcohol use: No  . Drug use: No     Allergies   Amiodarone, Crestor [rosuvastatin calcium], and Lipitor [atorvastatin calcium]   Review of Systems Review of Systems  Constitutional: Negative for chills and fever.  HENT: Positive for nosebleeds. Negative for congestion, ear pain and sore throat.   Eyes: Negative for pain and visual disturbance.  Respiratory: Negative for cough and shortness of breath.   Cardiovascular: Negative for chest pain and palpitations.  Gastrointestinal: Negative for abdominal pain and vomiting.  Genitourinary: Negative for dysuria and hematuria.  Musculoskeletal: Negative for arthralgias and back pain.  Skin: Negative for color change and rash.  Neurological: Negative for seizures and syncope.  All other systems reviewed and are negative.    Physical Exam Updated Vital Signs  ED Triage Vitals  Enc Vitals Group     BP 05/14/19 2030 102/69     Pulse Rate 05/14/19 2030 75     Resp 05/14/19 2030 16     Temp 05/14/19 2030 97.8 F (36.6 C)     Temp Source 05/14/19 2030 Oral     SpO2 05/14/19 2027 95 %     Weight 05/14/19 2030 144 lb (65.3 kg)     Height  05/14/19 2030 5\' 11"  (1.803 m)     Head Circumference --      Peak Flow --      Pain Score 05/14/19 2031 0     Pain Loc --      Pain Edu? --      Excl. in [place]? --     Physical Exam Vitals signs and nursing note reviewed.  Constitutional:      General: She is not in acute distress.    Appearance: She  is well-developed.  HENT:     Head: Normocephalic and atraumatic.     Nose: Nose normal. No congestion or rhinorrhea.     Comments: No nasal septal hematoma    Mouth/Throat:     Mouth: Mucous membranes are moist.  Eyes:     Extraocular Movements: Extraocular movements intact.     Conjunctiva/sclera: Conjunctivae normal.     Pupils: Pupils are equal, round, and reactive to light.  Neck:     Musculoskeletal: Normal range of motion and neck supple.  Cardiovascular:     Rate and Rhythm: Normal rate and regular rhythm.     Pulses: Normal pulses.     Heart sounds: Normal heart sounds. No murmur.  Pulmonary:     Effort: Pulmonary effort is normal. No respiratory distress.     Breath sounds: Normal breath sounds.  Abdominal:     General: Abdomen is flat.     Palpations: Abdomen is soft.     Tenderness: There is no abdominal tenderness.  Skin:    General: Skin is warm and dry.     Capillary Refill: Capillary refill takes less than 2 seconds.  Neurological:     General: No focal deficit present.     Mental Status: She is alert.  Psychiatric:        Mood and Affect: Mood normal.      ED Treatments / Results  Labs (all labs ordered are listed, but only abnormal results are displayed) Labs Reviewed  CBC - Abnormal; Notable for the following components:      Result Value   RBC 3.25 (*)    Hemoglobin 10.7 (*)    HCT 33.2 (*)    MCV 102.2 (*)    All other components within normal limits  PROTIME-INR - Abnormal; Notable for the following components:   Prothrombin Time 31.5 (*)    INR 3.1 (*)    All other components within normal limits    EKG None  Radiology No results  found.  Procedures Procedures (including critical care time)  Medications Ordered in ED Medications  oxymetazoline (AFRIN) 0.05 % nasal spray 1 spray (has no administration in time range)     Initial Impression / Assessment and Plan / ED Course  I have reviewed the triage vital signs and the nursing notes.  Pertinent labs & imaging results that were available during my care of the patient were reviewed by me and considered in my medical decision making (see chart for details).     Natasha Livingston is an 85 year old female with history of atrial fibrillation on Coumadin who presents the ED with right-sided nosebleed.  Patient with bleeding that has now stopped after Afrin and pressure by EMS.  Patient with INR level of 3.1.  Otherwise hemoglobin is stable.  Patient is not tachycardic.  Asymptomatic.  No nasal septal hematoma.  Educated further about nosebleeds and discharged from the ED in good condition.  Patient was watched in the ED for several hours without any rebleed.  This chart was dictated using voice recognition software.  Despite best efforts to proofread,  errors can occur which can change the documentation meaning.    Final Clinical Impressions(s) / ED Diagnoses   Final diagnoses:  Epistaxis    ED Discharge Orders    None       Lennice Sites, DO 05/14/19 2218

## 2019-05-17 ENCOUNTER — Telehealth: Payer: Self-pay | Admitting: Nurse Practitioner

## 2019-05-17 NOTE — Telephone Encounter (Signed)
New Message     Patient son states she went to ER and they checked her coumadin and it was 3.1 and now they want to know what to do next.  Please call patient's son.

## 2019-05-17 NOTE — Telephone Encounter (Signed)
Called patient son. Advised that patient have a serving of greens and continue current dose of coumadin. Scheduled patient an appointment for INR check in coumadin clinic on 7/29

## 2019-05-31 ENCOUNTER — Telehealth: Payer: Self-pay | Admitting: Pharmacist

## 2019-05-31 ENCOUNTER — Other Ambulatory Visit: Payer: Self-pay | Admitting: Nurse Practitioner

## 2019-05-31 NOTE — Telephone Encounter (Signed)

## 2019-06-01 ENCOUNTER — Other Ambulatory Visit: Payer: Self-pay

## 2019-06-01 ENCOUNTER — Ambulatory Visit (INDEPENDENT_AMBULATORY_CARE_PROVIDER_SITE_OTHER): Payer: Medicare Other

## 2019-06-01 DIAGNOSIS — I4891 Unspecified atrial fibrillation: Secondary | ICD-10-CM

## 2019-06-01 DIAGNOSIS — Z5181 Encounter for therapeutic drug level monitoring: Secondary | ICD-10-CM | POA: Diagnosis not present

## 2019-06-01 LAB — POCT INR: INR: 3.9 — AB (ref 2.0–3.0)

## 2019-06-01 NOTE — Patient Instructions (Signed)
Description   Skip tomorrow's dosage of Coumadin, then start taking 1/2 tablet daily except 1 tablet on Mondays, Wednesdays and Fridays.  Recheck INR in 2 weeks.  Keep your normal green intake. Call our office if you have any questions or unusual bleeding [phone].

## 2019-06-13 ENCOUNTER — Other Ambulatory Visit: Payer: Self-pay | Admitting: Cardiology

## 2019-06-16 ENCOUNTER — Other Ambulatory Visit: Payer: Self-pay

## 2019-06-16 ENCOUNTER — Ambulatory Visit (INDEPENDENT_AMBULATORY_CARE_PROVIDER_SITE_OTHER): Payer: Medicare Other | Admitting: *Deleted

## 2019-06-16 DIAGNOSIS — R040 Epistaxis: Secondary | ICD-10-CM

## 2019-06-16 DIAGNOSIS — Z5181 Encounter for therapeutic drug level monitoring: Secondary | ICD-10-CM

## 2019-06-16 DIAGNOSIS — I4891 Unspecified atrial fibrillation: Secondary | ICD-10-CM | POA: Diagnosis not present

## 2019-06-16 HISTORY — DX: Epistaxis: R04.0

## 2019-06-16 LAB — POCT INR: INR: 2.9 (ref 2.0–3.0)

## 2019-06-16 NOTE — Patient Instructions (Signed)
Description   Continue taking 1/2 tablet daily except 1 tablet on Mondays, Wednesdays and Fridays.  Recheck INR in 3 weeks.  Keep your normal green intake. Call our office if you have any questions or unusual bleeding [phone].

## 2019-06-22 DIAGNOSIS — H04123 Dry eye syndrome of bilateral lacrimal glands: Secondary | ICD-10-CM | POA: Diagnosis not present

## 2019-06-22 DIAGNOSIS — Z961 Presence of intraocular lens: Secondary | ICD-10-CM | POA: Diagnosis not present

## 2019-06-22 DIAGNOSIS — H04223 Epiphora due to insufficient drainage, bilateral lacrimal glands: Secondary | ICD-10-CM | POA: Diagnosis not present

## 2019-06-29 DIAGNOSIS — M8580 Other specified disorders of bone density and structure, unspecified site: Secondary | ICD-10-CM | POA: Diagnosis not present

## 2019-06-29 DIAGNOSIS — M109 Gout, unspecified: Secondary | ICD-10-CM | POA: Diagnosis not present

## 2019-06-29 DIAGNOSIS — Z79899 Other long term (current) drug therapy: Secondary | ICD-10-CM | POA: Diagnosis not present

## 2019-06-29 DIAGNOSIS — E039 Hypothyroidism, unspecified: Secondary | ICD-10-CM | POA: Diagnosis not present

## 2019-06-29 DIAGNOSIS — G629 Polyneuropathy, unspecified: Secondary | ICD-10-CM | POA: Diagnosis not present

## 2019-06-29 DIAGNOSIS — I2510 Atherosclerotic heart disease of native coronary artery without angina pectoris: Secondary | ICD-10-CM | POA: Diagnosis not present

## 2019-07-05 ENCOUNTER — Other Ambulatory Visit: Payer: Self-pay

## 2019-07-05 ENCOUNTER — Ambulatory Visit (INDEPENDENT_AMBULATORY_CARE_PROVIDER_SITE_OTHER): Payer: Medicare Other

## 2019-07-05 DIAGNOSIS — Z5181 Encounter for therapeutic drug level monitoring: Secondary | ICD-10-CM | POA: Diagnosis not present

## 2019-07-05 DIAGNOSIS — I4891 Unspecified atrial fibrillation: Secondary | ICD-10-CM

## 2019-07-05 LAB — POCT INR: INR: 2.4 (ref 2.0–3.0)

## 2019-07-05 NOTE — Patient Instructions (Signed)
Description   Continue taking 1/2 tablet daily except 1 tablet on Mondays, Wednesdays and Fridays.  Recheck INR in 4 weeks.  Keep your normal green intake. Call our office if you have any questions or unusual bleeding [phone].

## 2019-08-03 ENCOUNTER — Ambulatory Visit (INDEPENDENT_AMBULATORY_CARE_PROVIDER_SITE_OTHER): Payer: Medicare Other | Admitting: *Deleted

## 2019-08-03 ENCOUNTER — Other Ambulatory Visit: Payer: Self-pay

## 2019-08-03 DIAGNOSIS — Z5181 Encounter for therapeutic drug level monitoring: Secondary | ICD-10-CM

## 2019-08-03 DIAGNOSIS — I4891 Unspecified atrial fibrillation: Secondary | ICD-10-CM

## 2019-08-03 LAB — POCT INR: INR: 2.6 (ref 2.0–3.0)

## 2019-08-03 NOTE — Patient Instructions (Signed)
Description   Continue taking 1/2 tablet daily except 1 tablet on Mondays, Wednesdays and Fridays.  Recheck INR in 6 weeks.  Keep your normal green intake. Call our office if you have any questions or unusual bleeding [phone].

## 2019-08-15 DIAGNOSIS — Z9181 History of falling: Secondary | ICD-10-CM | POA: Diagnosis not present

## 2019-08-15 DIAGNOSIS — Z1331 Encounter for screening for depression: Secondary | ICD-10-CM | POA: Diagnosis not present

## 2019-08-15 DIAGNOSIS — Z139 Encounter for screening, unspecified: Secondary | ICD-10-CM | POA: Diagnosis not present

## 2019-08-15 DIAGNOSIS — Z0000 Encounter for general adult medical examination without abnormal findings: Secondary | ICD-10-CM | POA: Diagnosis not present

## 2019-08-15 DIAGNOSIS — E785 Hyperlipidemia, unspecified: Secondary | ICD-10-CM | POA: Diagnosis not present

## 2019-08-19 DIAGNOSIS — I341 Nonrheumatic mitral (valve) prolapse: Secondary | ICD-10-CM | POA: Diagnosis not present

## 2019-08-19 DIAGNOSIS — Z23 Encounter for immunization: Secondary | ICD-10-CM | POA: Diagnosis not present

## 2019-08-19 DIAGNOSIS — L659 Nonscarring hair loss, unspecified: Secondary | ICD-10-CM | POA: Diagnosis not present

## 2019-08-19 DIAGNOSIS — I4891 Unspecified atrial fibrillation: Secondary | ICD-10-CM | POA: Diagnosis not present

## 2019-08-19 DIAGNOSIS — N390 Urinary tract infection, site not specified: Secondary | ICD-10-CM | POA: Diagnosis not present

## 2019-08-19 DIAGNOSIS — R3989 Other symptoms and signs involving the genitourinary system: Secondary | ICD-10-CM | POA: Diagnosis not present

## 2019-08-26 ENCOUNTER — Encounter (INDEPENDENT_AMBULATORY_CARE_PROVIDER_SITE_OTHER): Payer: Self-pay

## 2019-09-08 ENCOUNTER — Other Ambulatory Visit: Payer: Self-pay | Admitting: Cardiology

## 2019-09-14 ENCOUNTER — Ambulatory Visit (INDEPENDENT_AMBULATORY_CARE_PROVIDER_SITE_OTHER): Payer: Medicare Other | Admitting: *Deleted

## 2019-09-14 ENCOUNTER — Other Ambulatory Visit: Payer: Self-pay

## 2019-09-14 DIAGNOSIS — Z5181 Encounter for therapeutic drug level monitoring: Secondary | ICD-10-CM

## 2019-09-14 DIAGNOSIS — I4891 Unspecified atrial fibrillation: Secondary | ICD-10-CM

## 2019-09-14 LAB — POCT INR: INR: 2.2 (ref 2.0–3.0)

## 2019-09-14 NOTE — Patient Instructions (Signed)
Description   Continue taking 1/2 tablet daily except 1 tablet on Mondays, Wednesdays and Fridays.  Recheck INR in 6 weeks.  Keep your normal green intake. Call our office if you have any questions or unusual bleeding [phone].

## 2019-10-26 ENCOUNTER — Ambulatory Visit (INDEPENDENT_AMBULATORY_CARE_PROVIDER_SITE_OTHER): Payer: Medicare Other | Admitting: *Deleted

## 2019-10-26 ENCOUNTER — Other Ambulatory Visit: Payer: Self-pay

## 2019-10-26 DIAGNOSIS — Z5181 Encounter for therapeutic drug level monitoring: Secondary | ICD-10-CM | POA: Diagnosis not present

## 2019-10-26 DIAGNOSIS — I4891 Unspecified atrial fibrillation: Secondary | ICD-10-CM

## 2019-10-26 LAB — POCT INR: INR: 3.2 — AB (ref 2.0–3.0)

## 2019-10-26 NOTE — Patient Instructions (Signed)
Description    Hold tomorrow, then continue taking 1/2 tablet daily except 1 tablet on Mondays, Wednesdays and Fridays.  Recheck INR in 3 weeks.  Keep your normal green intake. Call our office if you have any questions or unusual bleeding [phone].

## 2019-11-13 ENCOUNTER — Other Ambulatory Visit: Payer: Self-pay | Admitting: Cardiology

## 2019-11-16 ENCOUNTER — Other Ambulatory Visit: Payer: Self-pay

## 2019-11-16 ENCOUNTER — Ambulatory Visit (INDEPENDENT_AMBULATORY_CARE_PROVIDER_SITE_OTHER): Payer: Medicare Other

## 2019-11-16 DIAGNOSIS — I4891 Unspecified atrial fibrillation: Secondary | ICD-10-CM | POA: Diagnosis not present

## 2019-11-16 DIAGNOSIS — Z5181 Encounter for therapeutic drug level monitoring: Secondary | ICD-10-CM | POA: Diagnosis not present

## 2019-11-16 LAB — POCT INR: INR: 2.3 (ref 2.0–3.0)

## 2019-11-16 NOTE — Patient Instructions (Signed)
Description    Continue on same dosage 1/2 tablet daily except 1 tablet on Mondays, Wednesdays and Fridays.  Recheck INR in 4 weeks.  Keep your normal green intake. Call our office if you have any questions or unusual bleeding [phone].

## 2019-11-16 NOTE — Telephone Encounter (Signed)
Pt seen in [facility]madin Clinic, requesting 90 day rx be sent to [facility] for Entresto rx.  May need local 30 [facility] [place] [facility] while awaiting mail order rx.  Pt's son will call [facility] to request refill if needed and it will be sent electronically.  Thanks.  Call pt's son with questions.

## 2019-11-17 ENCOUNTER — Encounter: Payer: Self-pay | Admitting: Cardiology

## 2019-11-17 MED ORDER — ENTRESTO 24-26 MG PO TABS: 1.0000 | ORAL_TABLET | Freq: Two times a day (BID) | ORAL | 0 refills | Status: DC

## 2019-11-17 MED ORDER — ENTRESTO 24-26 MG PO TABS: 1.0000 | ORAL_TABLET | Freq: Two times a day (BID) | ORAL | 1 refills | Status: DC

## 2019-11-17 NOTE — Telephone Encounter (Signed)
Pt's medication was sent to pt's pharmacies as requested. To [facility] and local pharmacy for a 10 day supply until mail order arrives, [facility] [place]. Confirmation received for both.

## 2019-11-17 NOTE — Telephone Encounter (Signed)
error 

## 2019-12-08 ENCOUNTER — Telehealth: Payer: Self-pay | Admitting: Cardiology

## 2019-12-08 NOTE — Telephone Encounter (Signed)
Called back to speak with Elmyra Ricks.  Paperwork with orders for genetic testing had been received and faxed back to the company (several weeks ago) stating Dr Marlou Porch did not order, does not agree with the need for and will not sign those orders.  Left message on Nicole's secure voicemail of this information. Advised her to f/u with whomever did order/request this information.

## 2019-12-08 NOTE — Telephone Encounter (Signed)
Natasha Livingston, from [employer] is calling about cardiac risk assessment forms that were faxed to [place] on 11/29/19.

## 2019-12-13 ENCOUNTER — Ambulatory Visit: Payer: Medicare Other | Admitting: Cardiology

## 2019-12-16 ENCOUNTER — Ambulatory Visit (INDEPENDENT_AMBULATORY_CARE_PROVIDER_SITE_OTHER): Payer: Medicare Other

## 2019-12-16 ENCOUNTER — Other Ambulatory Visit: Payer: Self-pay

## 2019-12-16 DIAGNOSIS — I4891 Unspecified atrial fibrillation: Secondary | ICD-10-CM

## 2019-12-16 DIAGNOSIS — Z5181 Encounter for therapeutic drug level monitoring: Secondary | ICD-10-CM | POA: Diagnosis not present

## 2019-12-16 LAB — POCT INR: INR: 2.6 (ref 2.0–3.0)

## 2019-12-16 NOTE — Patient Instructions (Signed)
Description    Continue on same dosage 1/2 tablet daily except 1 tablet on Mondays, Wednesdays and Fridays.  Recheck INR in 5 weeks.  Keep your normal green intake. Call our office if you have any questions or unusual bleeding [phone].

## 2020-01-05 ENCOUNTER — Other Ambulatory Visit: Payer: Self-pay | Admitting: Cardiology

## 2020-01-18 ENCOUNTER — Encounter: Payer: Self-pay | Admitting: Cardiology

## 2020-01-18 ENCOUNTER — Ambulatory Visit (INDEPENDENT_AMBULATORY_CARE_PROVIDER_SITE_OTHER): Payer: Medicare Other | Admitting: *Deleted

## 2020-01-18 ENCOUNTER — Ambulatory Visit (INDEPENDENT_AMBULATORY_CARE_PROVIDER_SITE_OTHER): Payer: Medicare Other | Admitting: Cardiology

## 2020-01-18 ENCOUNTER — Other Ambulatory Visit: Payer: Self-pay

## 2020-01-18 VITALS — BP 100/50 | HR 81 | Ht 67.0 in | Wt 146.0 lb

## 2020-01-18 DIAGNOSIS — I2510 Atherosclerotic heart disease of native coronary artery without angina pectoris: Secondary | ICD-10-CM | POA: Diagnosis not present

## 2020-01-18 DIAGNOSIS — I5022 Chronic systolic (congestive) heart failure: Secondary | ICD-10-CM

## 2020-01-18 DIAGNOSIS — Z5181 Encounter for therapeutic drug level monitoring: Secondary | ICD-10-CM | POA: Diagnosis not present

## 2020-01-18 DIAGNOSIS — I4819 Other persistent atrial fibrillation: Secondary | ICD-10-CM | POA: Diagnosis not present

## 2020-01-18 DIAGNOSIS — I4891 Unspecified atrial fibrillation: Secondary | ICD-10-CM | POA: Diagnosis not present

## 2020-01-18 LAB — BASIC METABOLIC PANEL
BUN/Creatinine Ratio: 17 (ref 12–28)
BUN: 18 mg/dL (ref 8–27)
CO2: 25 mmol/L (ref 20–29)
Calcium: 9.7 mg/dL (ref 8.7–10.3)
Chloride: 97 mmol/L (ref 96–106)
Creatinine, Ser: 1.08 mg/dL — ABNORMAL HIGH (ref 0.57–1.00)
GFR calc Af Amer: 55 mL/min/{1.73_m2} — ABNORMAL LOW (ref >59)
GFR calc non Af Amer: 48 mL/min/{1.73_m2} — ABNORMAL LOW (ref >59)
Glucose: 217 mg/dL — ABNORMAL HIGH (ref 65–99)
Potassium: 4 mmol/L (ref 3.5–5.2)
Sodium: 136 mmol/L (ref 134–144)

## 2020-01-18 LAB — CBC
Hematocrit: 39.5 % (ref 34.0–46.6)
Hemoglobin: 13.6 g/dL (ref 11.1–15.9)
MCH: 32.9 pg (ref 26.6–33.0)
MCHC: 34.4 g/dL (ref 31.5–35.7)
MCV: 96 fL (ref 79–97)
Platelets: 213 10*3/uL (ref 150–450)
RBC: 4.13 x10E6/uL (ref 3.77–5.28)
RDW: 13.2 % (ref 11.7–15.4)
WBC: 6.6 10*3/uL (ref 3.4–10.8)

## 2020-01-18 LAB — POCT INR: INR: 2.6 (ref 2.0–3.0)

## 2020-01-18 MED ORDER — FUROSEMIDE 40 MG PO TABS: 40.0000 mg | ORAL_TABLET | Freq: Two times a day (BID) | ORAL | 3 refills | Status: DC

## 2020-01-18 NOTE — Patient Instructions (Signed)
Medication Instructions:  Your physician recommends that you continue on your current medications as directed. Please refer to the Current Medication list given to you today.  *If you need a refill on your cardiac medications before your next appointment, please call your pharmacy*   Lab Work: TODAY: BMET, CBC  If you have labs (blood work) drawn today and your tests are completely normal, you will receive your results only by: Marland Kitchen MyChart Message (if you have MyChart) OR . A paper copy in the mail If you have any lab test that is abnormal or we need to change your treatment, we will call you to review the results.   Follow-Up: At [facility], you and your health needs are our priority.  As part of our continuing mission to provide you with exceptional heart care, we have created designated Provider Care Teams.  These Care Teams include your primary Cardiologist (physician) and Advanced Practice Providers (APPs -  Physician Assistants and Nurse Practitioners) who all work together to provide you with the care you need, when you need it.  Your next appointment:   6 month(s)  The format for your next appointment:   In Person  Provider:   Truitt Merle, NP   Other Instructions.  Follow up with Dr. Marlou Porch in 1 year.  You will receive a reminder letter in the mail two months in advance. If you don't receive a letter, please call our office to schedule the follow-up appointment.

## 2020-01-18 NOTE — Progress Notes (Signed)
Cardiology Office Note:    Date:  01/18/2020   ID:  Natasha Livingston, DOB 1934/02/13, MRN [id number]  PCP:  Cyndi Bender, PA-C  Cardiologist:  Candee Furbish, MD  Electrophysiologist:  None   Referring MD: Cyndi Bender, PA-C     History of Present Illness:    Natasha Livingston is a 86 y.o. female here for follow-up of atrial fibrillation.  Last seen on 03/04/2019 by Truitt Merle via telemedicine during COVID-19 pandemic. Remote CABG in 2008.  Dr. Darcey Nora.  Back in June 2019, EF had improved to 35 to 40%.  She also had atrial flutter with 2-1 conduction rapid ventricular response and Toprol was increased after discussion with Dr. Curt Bears.  She improved as well.  She has had low BP in the past.  Lasix dose was reduced at 1 point because of this.  More sleepy during the day but she was also on Neurontin.  No chest pain, no significant breathing issues.  More orthopedic issues.  Fell in house left ribs.  This happened about a month ago.  Still hurts.  Did not go to the emergency room.  She does have a couple area rugs that may be a hindrance.  Discussed with her son.  Past Medical History:  Diagnosis Date  . Atrial fibrillation ([place])   . Back pain   . Bruises easily   . Chest discomfort   . Diverticulitis   . Dizziness   . DOE (dyspnea on exertion)   . Epistaxis 06/16/2019  . Forgetfulness   . Hx of cardiovascular stress test    Lexiscan Myoview (12/15):  Apical lateral and anterolateral fixed defect, no ischemia, EF 54%; low risk  . Hyperlipidemia   . Hypertension   . Hypothyroidism   . Interstitial nephritis   . Ischemic heart disease   . MVP (mitral valve prolapse)   . Neuropathy     Past Surgical History:  Procedure Laterality Date  . CARDIOVERSION  08/2008  . CORONARY ARTERY BYPASS GRAFT  04/2007  . OVARIAN CYST REMOVAL      Current Medications: Current Meds  Medication Sig  . acetaminophen (TYLENOL) 500 MG tablet Take 500 mg by mouth every 6 (six) hours as  needed (For pain.).  Marland Kitchen allopurinol (ZYLOPRIM) 300 MG tablet Take 300 mg by mouth daily.   . colchicine 0.6 MG tablet Take 0.6 mg by mouth daily as needed (For gout flare-ups.).   Marland Kitchen diclofenac sodium (VOLTAREN) 1 % GEL Apply 2 g topically 4 (four) times daily as needed (For pain.).  Marland Kitchen ENTRESTO 24-26 MG TAKE 1 TABLET BY MOUTH TWICE A DAY  . fexofenadine (ALLEGRA) 60 MG tablet Take 60 mg by mouth 2 (two) times daily as needed for allergies or rhinitis.  Marland Kitchen gabapentin (NEURONTIN) 800 MG tablet Take 400-800 mg by mouth See admin instructions. Take one half tablet (400 mg) am take one tablet (800 mg) pm  . hydrocortisone (ANUSOL-HC) 2.5 % rectal cream Place 1 application rectally 2 (two) times daily as needed for hemorrhoids.  Marland Kitchen KLOR-CON M20 20 MEQ tablet TAKE 2 TABLETS BY MOUTH EVERY DAY  . levothyroxine (SYNTHROID) 25 MCG tablet Take 25 mcg by mouth daily before breakfast.  . metoprolol succinate (TOPROL-XL) 50 MG 24 hr tablet TAKE 1 TABLET BY MOUTH TWICE A DAY WITH OR IMMEDIATELY FOLLOWING A MEAL  . nitroGLYCERIN (NITROSTAT) 0.4 MG SL tablet Place 1 tablet (0.4 mg total) under the tongue every 5 (five) minutes as needed for chest pain.  Marland Kitchen  warfarin (COUMADIN) 5 MG tablet TAKE AS DIRECTED BY COUMADIN CLINIC  . [DISCONTINUED] furosemide (LASIX) 40 MG tablet Take 1.5 tablets (60 mg total) by mouth 2 (two) times daily. Please make yearly appt with Dr. Marlou Porch for May before anymore refills. 1st attempt (Patient taking differently: Take 40 mg by mouth 2 (two) times daily. Please make yearly appt with Dr. Marlou Porch for May before anymore refills. 1st attempt)     Allergies:   Amiodarone, Crestor [rosuvastatin calcium], and Lipitor [atorvastatin calcium]   Social History   Socioeconomic History  . Marital status: Married    Spouse name: Not on file  . Number of children: Not on file  . Years of education: Not on file  . Highest education level: Not on file  Occupational History  . Not on file  Tobacco  Use  . Smoking status: Never Smoker  . Smokeless tobacco: Never Used  Substance and Sexual Activity  . Alcohol use: No  . Drug use: No  . Sexual activity: Never  Other Topics Concern  . Not on file  Social History Narrative  . Not on file   Social Determinants of Health   Financial Resource Strain:   . Difficulty of Paying Living Expenses:   Food Insecurity:   . Worried About Charity fundraiser in the Last Year:   . Arboriculturist in the Last Year:   Transportation Needs:   . Film/video editor (Medical):   Marland Kitchen Lack of Transportation (Non-Medical):   Physical Activity:   . Days of Exercise per Week:   . Minutes of Exercise per Session:   Stress:   . Feeling of Stress :   Social Connections:   . Frequency of Communication with Friends and Family:   . Frequency of Social Gatherings with Friends and Family:   . Attends Religious Services:   . Active Member of Clubs or Organizations:   . Attends Archivist Meetings:   Marland Kitchen Marital Status:      Family History: The patient's family history includes Cerebral aneurysm in her father and another family member; Hypertension in her father. There is no history of Heart attack or Stroke.  ROS:   Please see the history of present illness.    No fever chills nausea vomiting syncope all other systems reviewed and are negative.  EKGs/Labs/Other Studies Reviewed:    The following studies were reviewed today: Prior echo's office notes were reviewed  EKG:  EKG is  ordered today.  The ekg ordered today demonstrates atrial fibrillation 81 bpm inferior lateral T wave inversions same as before.  Recent Labs: 05/02/2019: BUN 22; Creatinine, Ser 0.93; Potassium 3.8; Sodium 141 05/14/2019: Hemoglobin 10.7; Platelets 258  Recent Lipid Panel    Component Value Date/Time   CHOL 160 02/01/2016 1224   TRIG 185 (H) 02/01/2016 1224   HDL 29 (L) 02/01/2016 1224   CHOLHDL 5.5 (H) 02/01/2016 1224   VLDL 37 (H) 02/01/2016 1224    LDLCALC 94 02/01/2016 1224    Physical Exam:    VS:  BP (!) 100/50   Pulse 81   Ht 5\' 7"  (1.702 m)   Wt 146 lb (66.2 kg)   SpO2 92%   BMI 22.87 kg/m     Wt Readings from Last 3 Encounters:  01/18/20 146 lb (66.2 kg)  05/14/19 144 lb (65.3 kg)  05/02/19 154 lb (69.9 kg)     GEN: Slow gait well nourished, well developed in no acute distress HEENT:  Normal NECK: No JVD; No carotid bruits LYMPHATICS: No lymphadenopathy CARDIAC: Irregularly irregular, no murmurs, rubs, gallops RESPIRATORY:  Clear to auscultation without rales, wheezing or rhonchi  ABDOMEN: Soft, non-tender, non-distended MUSCULOSKELETAL:  No edema; No deformity  SKIN: Warm and dry NEUROLOGIC:  Alert and oriented x 3 PSYCHIATRIC:  Normal affect   ASSESSMENT:    1. Chronic systolic heart failure (HCC)   2. Persistent atrial fibrillation ([place])   3. Coronary artery disease involving native coronary artery of native heart without angina pectoris    PLAN:    In order of problems listed above:  Chronic systolic heart failure -Taking Lasix 40 mg twice a day.  Doing well with this.  Maintaining her fluid balance.  She is also on Entresto.  They had to buy it from the pharmacy for $150 a month.  Usually gets it through the [place].  Trying to restrict salt.  No significant shortness of breath.  NYHA class II. -Continue with low-dose Toprol.  I do not have any room to increase any of the medications given her low normal blood pressure.  Her fall was not secondary to syncope.  Persistent atrial fibrillation -Has had flutter as well.  Beta-blocker therapy utilized.  Appreciate Dr. Curt Bears previous assistance.  Chronic anticoagulation -Continuing with Coumadin.  No changes made.  Closely monitoring.  Last INR 2.6 monitored by her Coumadin clinic.  Hemoglobin 12.3 from outside labs creatinine 0.9 -She has been in the emergency room in July 2020 with nosebleeds.  This did resolve.  High risk drug monitoring via our Coumadin  clinic with basic metabolic profile and CBC.  CAD with CABG -No anginal symptoms.  Hyperlipidemia -Statin intolerant in the past.  LDL 109 from outside labs.  Neuropathy -Neurontin.  Limited by back and hip pain.  Secondary pulmonary hypertension -From left heart disease.  Decreased EF.  Continue with management of left heart disease.  If her shoulder and rib pain do not improve, she should seek the attention of Cyndi Bender, [place].  Medication Adjustments/Labs and Tests Ordered: Current medicines are reviewed at length with the patient today.  Concerns regarding medicines are outlined above.  Orders Placed This Encounter  Procedures  . Basic metabolic panel  . CBC  . EKG 12-Lead   Meds ordered this encounter  Medications  . furosemide (LASIX) 40 MG tablet    Sig: Take 1 tablet (40 mg total) by mouth 2 (two) times daily.    Dispense:  90 tablet    Refill:  3    Patient Instructions  Medication Instructions:  Your physician recommends that you continue on your current medications as directed. Please refer to the Current Medication list given to you today.  *If you need a refill on your cardiac medications before your next appointment, please call your pharmacy*   Lab Work: TODAY: BMET, CBC  If you have labs (blood work) drawn today and your tests are completely normal, you will receive your results only by: Marland Kitchen MyChart Message (if you have MyChart) OR . A paper copy in the mail If you have any lab test that is abnormal or we need to change your treatment, we will call you to review the results.   Follow-Up: At [facility], you and your health needs are our priority.  As part of our continuing mission to provide you with exceptional heart care, we have created designated Provider Care Teams.  These Care Teams include your primary Cardiologist (physician) and Advanced Practice Providers (APPs -  Physician Assistants and Nurse Practitioners) who all work together to  provide you with the care you need, when you need it.  Your next appointment:   6 month(s)  The format for your next appointment:   In Person  Provider:   Truitt Merle, NP   Other Instructions.  Follow up with Dr. Marlou Porch in 1 year.  You will receive a reminder letter in the mail two months in advance. If you don't receive a letter, please call our office to schedule the follow-up appointment.      Signed, Candee Furbish, MD  01/18/2020 10:48 AM    [facility]

## 2020-01-18 NOTE — Patient Instructions (Signed)
Description   Continue on same dosage 1/2 tablet daily except 1 tablet on Mondays, Wednesdays and Fridays.  Recheck INR in 6 weeks.  Keep your normal green intake. Call our office if you have any questions or unusual bleeding [phone].

## 2020-01-19 DIAGNOSIS — Z79899 Other long term (current) drug therapy: Secondary | ICD-10-CM | POA: Diagnosis not present

## 2020-01-19 DIAGNOSIS — M109 Gout, unspecified: Secondary | ICD-10-CM | POA: Diagnosis not present

## 2020-01-19 DIAGNOSIS — G629 Polyneuropathy, unspecified: Secondary | ICD-10-CM | POA: Diagnosis not present

## 2020-01-19 DIAGNOSIS — M25512 Pain in left shoulder: Secondary | ICD-10-CM | POA: Diagnosis not present

## 2020-01-19 DIAGNOSIS — E039 Hypothyroidism, unspecified: Secondary | ICD-10-CM | POA: Diagnosis not present

## 2020-02-05 ENCOUNTER — Other Ambulatory Visit: Payer: Self-pay | Admitting: Nurse Practitioner

## 2020-02-29 ENCOUNTER — Ambulatory Visit (INDEPENDENT_AMBULATORY_CARE_PROVIDER_SITE_OTHER): Payer: Medicare Other | Admitting: *Deleted

## 2020-02-29 ENCOUNTER — Other Ambulatory Visit: Payer: Self-pay

## 2020-02-29 DIAGNOSIS — I4891 Unspecified atrial fibrillation: Secondary | ICD-10-CM | POA: Diagnosis not present

## 2020-02-29 DIAGNOSIS — Z5181 Encounter for therapeutic drug level monitoring: Secondary | ICD-10-CM

## 2020-02-29 LAB — POCT INR: INR: 3.2 — AB (ref 2.0–3.0)

## 2020-02-29 NOTE — Patient Instructions (Signed)
Description   Since you have taken today's dose do not take any warfarin tomorrow then continue on same dosage 1/2 tablet daily except 1 tablet on Mondays, Wednesdays and Fridays.  Recheck INR in 4 weeks.  Keep your normal green intake. Call our office if you have any questions or unusual bleeding [phone].

## 2020-03-07 ENCOUNTER — Other Ambulatory Visit: Payer: Self-pay | Admitting: Cardiology

## 2020-03-27 ENCOUNTER — Ambulatory Visit (INDEPENDENT_AMBULATORY_CARE_PROVIDER_SITE_OTHER): Payer: Medicare Other | Admitting: *Deleted

## 2020-03-27 ENCOUNTER — Other Ambulatory Visit: Payer: Self-pay

## 2020-03-27 DIAGNOSIS — I4891 Unspecified atrial fibrillation: Secondary | ICD-10-CM

## 2020-03-27 DIAGNOSIS — Z5181 Encounter for therapeutic drug level monitoring: Secondary | ICD-10-CM

## 2020-03-27 LAB — POCT INR: INR: 3.6 — AB (ref 2.0–3.0)

## 2020-03-27 NOTE — Patient Instructions (Signed)
Description   Hold warfarin today and then start taking 1/2 tablet daily except for 1 tablet on  Mondays, and Fridays.  Recheck INR in 3 weeks.  Keep your normal green intake. Call our office if you have any questions or unusual bleeding [phone].

## 2020-04-17 ENCOUNTER — Ambulatory Visit (INDEPENDENT_AMBULATORY_CARE_PROVIDER_SITE_OTHER): Payer: Medicare Other | Admitting: *Deleted

## 2020-04-17 ENCOUNTER — Other Ambulatory Visit: Payer: Self-pay

## 2020-04-17 DIAGNOSIS — Z5181 Encounter for therapeutic drug level monitoring: Secondary | ICD-10-CM

## 2020-04-17 DIAGNOSIS — I4891 Unspecified atrial fibrillation: Secondary | ICD-10-CM | POA: Diagnosis not present

## 2020-04-17 LAB — POCT INR: INR: 2.2 (ref 2.0–3.0)

## 2020-04-17 NOTE — Patient Instructions (Signed)
Description   Continue taking 1/2 tablet daily except for 1 tablet on  Mondays, and Fridays.  Recheck INR in 4 weeks.  Keep your normal green intake. Call our office if you have any questions or unusual bleeding [phone].

## 2020-04-18 ENCOUNTER — Telehealth: Payer: Self-pay | Admitting: Cardiology

## 2020-04-18 MED ORDER — ENTRESTO 24-26 MG PO TABS: 1.0000 | ORAL_TABLET | Freq: Two times a day (BID) | ORAL | 2 refills | Status: DC

## 2020-04-18 NOTE — Telephone Encounter (Signed)
Pt's medication was sent to pt's pharmacy as requested. Confirmation received.  °

## 2020-04-18 NOTE — Telephone Encounter (Signed)
°*  STAT* If patient is at the pharmacy, call can be transferred to refill team.   1. Which medications need to be refilled? (please list name of each medication and dose if known) ENTRESTO 24-26 MG  2. Which pharmacy/location (including street and city if local pharmacy) is medication to be sent to? [facility] - [place], [address]  3. Do they need a 30 day or 90 day supply? 90 day  Please call son when script is called in.

## 2020-04-25 NOTE — Telephone Encounter (Signed)
Patient's son called to see if refill was sent. I informed him it was sent 04/18/2020.

## 2020-05-15 ENCOUNTER — Other Ambulatory Visit: Payer: Self-pay

## 2020-05-15 ENCOUNTER — Ambulatory Visit (INDEPENDENT_AMBULATORY_CARE_PROVIDER_SITE_OTHER): Payer: Medicare Other | Admitting: *Deleted

## 2020-05-15 DIAGNOSIS — I4891 Unspecified atrial fibrillation: Secondary | ICD-10-CM | POA: Diagnosis not present

## 2020-05-15 DIAGNOSIS — Z5181 Encounter for therapeutic drug level monitoring: Secondary | ICD-10-CM

## 2020-05-15 LAB — POCT INR: INR: 2.5 (ref 2.0–3.0)

## 2020-05-15 NOTE — Patient Instructions (Signed)
Description   Continue taking 1/2 tablet daily except for 1 tablet on  Mondays and Fridays.  Recheck INR in 5 weeks.  Keep your normal green intake. Call our office if you have any questions or unusual bleeding [phone].

## 2020-05-18 ENCOUNTER — Other Ambulatory Visit: Payer: Self-pay | Admitting: Cardiology

## 2020-05-20 ENCOUNTER — Other Ambulatory Visit: Payer: Self-pay | Admitting: Nurse Practitioner

## 2020-06-19 ENCOUNTER — Ambulatory Visit (INDEPENDENT_AMBULATORY_CARE_PROVIDER_SITE_OTHER): Payer: Medicare Other | Admitting: *Deleted

## 2020-06-19 ENCOUNTER — Other Ambulatory Visit: Payer: Self-pay

## 2020-06-19 DIAGNOSIS — Z5181 Encounter for therapeutic drug level monitoring: Secondary | ICD-10-CM

## 2020-06-19 DIAGNOSIS — I4891 Unspecified atrial fibrillation: Secondary | ICD-10-CM

## 2020-06-19 LAB — POCT INR: INR: 2.4 (ref 2.0–3.0)

## 2020-06-19 NOTE — Patient Instructions (Addendum)
Description   Continue taking 1/2 tablet daily except for 1 tablet on  Mondays and Fridays.  Recheck INR in 6 weeks.  Keep your normal green intake. Call our office if you have any questions or unusual bleeding [phone].

## 2020-07-27 NOTE — Progress Notes (Signed)
CARDIOLOGY OFFICE NOTE  Date:  07/31/2020    [name] Date of Birth: [birth year] Medical Record #[id number]  PCP:  Cyndi Bender, PA-C  Cardiologist:  Marisa Cyphers  Chief Complaint  Patient presents with  . Follow-up    Seen for Dr. Marlou Porch    History of Present Illness: Natasha Livingston is a 86 y.o. female who presents today for a 6 month check. Seen for Dr. Marlou Porch. Former patient of Dr. Sherryl Barters.   She has a history ofchronic atrial fibrillation, HTN, CAD with remote CABG in 2008 by Dr. Darcey Nora. Other issues include diastolic HF, chronic fatigue, MR due to MVP, hypothyroidism, and HTN. She has had prior atrial flutter with 2-1 conduction and RVR. She has had prior decreased in EF - opted for medical management with no plans for further testing. She has been weaned off of Xanax.   Last seen by Dr. Marlou Porch in March - some falls noted.   Comes in today. Here with her son. Pretty unsteady - she has had some falls - basically goes to her knees. Has not gotten hurt. She does not wear her life line all the time. She will tend to go outside and pull weeds. She loses her balance easily. She did use some NTG - had gotten upset about another son and his behavior. She gets lots of spam calls - these upset her. Breathing seems stable. No bleeding.   Past Medical History:  Diagnosis Date  . Atrial fibrillation ([place])   . Back pain   . Bruises easily   . Chest discomfort   . Diverticulitis   . Dizziness   . DOE (dyspnea on exertion)   . Epistaxis 06/16/2019  . Forgetfulness   . Hx of cardiovascular stress test    Lexiscan Myoview (12/15):  Apical lateral and anterolateral fixed defect, no ischemia, EF 54%; low risk  . Hyperlipidemia   . Hypertension   . Hypothyroidism   . Interstitial nephritis   . Ischemic heart disease   . MVP (mitral valve prolapse)   . Neuropathy     Past Surgical History:  Procedure Laterality Date  . CARDIOVERSION  08/2008  . CORONARY  ARTERY BYPASS GRAFT  04/2007  . OVARIAN CYST REMOVAL       Medications: Current Meds  Medication Sig  . acetaminophen (TYLENOL) 500 MG tablet Take 500 mg by mouth every 6 (six) hours as needed (For pain.).  Marland Kitchen allopurinol (ZYLOPRIM) 300 MG tablet Take 300 mg by mouth daily.   . colchicine 0.6 MG tablet Take 0.6 mg by mouth daily as needed (For gout flare-ups.).   Marland Kitchen diclofenac sodium (VOLTAREN) 1 % GEL Apply 2 g topically 4 (four) times daily as needed (For pain.).  Marland Kitchen fexofenadine (ALLEGRA) 60 MG tablet Take 60 mg by mouth 2 (two) times daily as needed for allergies or rhinitis.  . furosemide (LASIX) 40 MG tablet Take 1 tablet (40 mg total) by mouth 2 (two) times daily.  Marland Kitchen gabapentin (NEURONTIN) 800 MG tablet Take 400-800 mg by mouth See admin instructions. Take one half tablet (400 mg) am take one tablet (800 mg) pm  . hydrocortisone (ANUSOL-HC) 2.5 % rectal cream Place 1 application rectally 2 (two) times daily as needed for hemorrhoids.  Marland Kitchen KLOR-CON M20 20 MEQ tablet TAKE 2 TABLETS BY MOUTH EVERY DAY  . levothyroxine (SYNTHROID) 25 MCG tablet Take 25 mcg by mouth daily before breakfast.  . metoprolol succinate (TOPROL-XL) 50 MG 24 hr tablet  TAKE 1 TABLET BY MOUTH TWICE A DAY WITH OR IMMEDIATELY FOLLOWING A MEAL  . nitroGLYCERIN (NITROSTAT) 0.4 MG SL tablet Place 1 tablet (0.4 mg total) under the tongue every 5 (five) minutes as needed for chest pain.  . sacubitril-valsartan (ENTRESTO) 24-26 MG Take 1 tablet by mouth 2 (two) times daily.  Marland Kitchen warfarin (COUMADIN) 5 MG tablet TAKE AS DIRECTED BY COUMADIN CLINIC     Allergies: Allergies  Allergen Reactions  . Amiodarone Other (See Comments)    unknown  . Crestor [Rosuvastatin Calcium] Other (See Comments)    unknown  . Lipitor [Atorvastatin Calcium] Swelling    Social History: The patient  reports that she has never smoked. She has never used smokeless tobacco. She reports that she does not drink alcohol and does not use drugs.     Family History: The patient's family history includes Cerebral aneurysm in her father and another family member; Hypertension in her father.   Review of Systems: Please see the history of present illness.   All other systems are reviewed and negative.   Physical Exam: VS:  BP 114/76   Pulse 77   Ht 5\' 7"  (1.702 m)   Wt 146 lb 6.4 oz (66.4 kg)   SpO2 95%   BMI 22.93 kg/m  .  BMI Body mass index is 22.93 kg/m.  Wt Readings from Last 3 Encounters:  07/31/20 146 lb 6.4 oz (66.4 kg)  01/18/20 146 lb (66.2 kg)  05/14/19 144 lb (65.3 kg)    General: Pleasant. Elderly. Alert and in no acute distress.   Cardiac: Irregular irregular rhythm. Rate is ok.  No edema.  Respiratory:  Lungs are clear to auscultation bilaterally with normal work of breathing.  GI: Soft and nontender.  MS: No deformity or atrophy. Gait and ROM intact.  Skin: Warm and dry. Color is normal.  Neuro:  Strength and sensation are intact and no gross focal deficits noted.  Psych: Alert, appropriate and with normal affect.   LABORATORY DATA:  EKG:  EKG is not ordered today.   Lab Results  Component Value Date   WBC 6.6 01/18/2020   HGB 13.6 01/18/2020   HCT 39.5 01/18/2020   PLT 213 01/18/2020   GLUCOSE 217 (H) 01/18/2020   CHOL 160 02/01/2016   TRIG 185 (H) 02/01/2016   HDL 29 (L) 02/01/2016   LDLCALC 94 02/01/2016   ALT 16 02/01/2016   AST 29 02/01/2016   NA 136 01/18/2020   K 4.0 01/18/2020   CL 97 01/18/2020   CREATININE 1.08 (H) 01/18/2020   BUN 18 01/18/2020   CO2 25 01/18/2020   TSH 1.80 10/10/2014   INR 1.8 (A) 07/31/2020   HGBA1C (H) 04/08/2007    6.9 (NOTE)   The ADA recommends the following therapeutic goals for glycemic   control related to Hgb A1C measurement:   Goal of Therapy:   < 7.0% Hgb A1C   Action Suggested:  > 8.0% Hgb A1C   Ref:  Diabetes Care, 22, Suppl. 1, 1999      Lab Results  Component Value Date   INR 1.8 (A) 07/31/2020   INR 2.4 06/19/2020   INR 2.5 05/15/2020      BNP (last 3 results) No results for input(s): BNP in the last 8760 hours.  ProBNP (last 3 results) No results for input(s): PROBNP in the last 8760 hours.   Other Studies Reviewed Today:  Echo Study Conclusions 02/2018  - Left ventricle: Inferobasal and posterior lateral hypokinesis  worse. Wall thickness was increased in a pattern of mild LVH.  Systolic function was moderately reduced. The estimated ejection  fraction was in the range of 35% to 40%. Left ventricular  diastolic function parameters were normal.  - Aortic valve: There was trivial regurgitation.  - Mitral valve: Moderately calcified annulus. Moderately thickened,  moderately calcified leaflets . There was mild regurgitation.  - Left atrium: The atrium was severely dilated.  - Right ventricle: The cavity size was mildly dilated.  - Right atrium: The atrium was severely dilated.  - Atrial septum: No defect or patent foramen ovale was identified.  - Tricuspid valve: There was severe regurgitation.     ASSESSMENT & PLAN:    1. Chronic systolic HF - seems fairly compensated. Weight is stable. Breathing is stable. Remains on low dose Entresto and beta blocker.   2. Persistent AF/prior atrial flutter - managed with rate control and continued anticoagulation with Warfarin.   3. CAD with remote CABG - no worrisome chest pain - she tends to get upset and will use NTG then.   4. Falls - will need to follow - encouraged her to use her Lifeline - especially when outside.   5. Chronic anticoagulation with Coumadin - needs baseline lab today.   6. HLD - statin intolerant.   7. Secondary pulmonary hypertension   Current medicines are reviewed with the patient today.  The patient does not have concerns regarding medicines other than what has been noted above.  The following changes have been made:  See above.  Labs/ tests ordered today include:    Orders Placed This Encounter  Procedures  . Basic  metabolic panel  . CBC     Disposition:   FU with Dr. Marlou Porch in 6 months. Baseline lab today. Safety needs to be her goal of care.    Patient is agreeable to this plan and will call if any problems develop in the interim.   SignedTruitt Merle, NP  07/31/2020 11:32 AM  [facility] [address] [place], [place]  [address] Phone: [phone] Fax: [phone]

## 2020-07-31 ENCOUNTER — Encounter: Payer: Self-pay | Admitting: Nurse Practitioner

## 2020-07-31 ENCOUNTER — Ambulatory Visit (INDEPENDENT_AMBULATORY_CARE_PROVIDER_SITE_OTHER): Payer: Medicare Other | Admitting: Nurse Practitioner

## 2020-07-31 ENCOUNTER — Ambulatory Visit (INDEPENDENT_AMBULATORY_CARE_PROVIDER_SITE_OTHER): Payer: Medicare Other

## 2020-07-31 ENCOUNTER — Other Ambulatory Visit: Payer: Self-pay

## 2020-07-31 VITALS — BP 114/76 | HR 77 | Ht 67.0 in | Wt 146.4 lb

## 2020-07-31 DIAGNOSIS — Z7901 Long term (current) use of anticoagulants: Secondary | ICD-10-CM

## 2020-07-31 DIAGNOSIS — I4819 Other persistent atrial fibrillation: Secondary | ICD-10-CM

## 2020-07-31 DIAGNOSIS — I4891 Unspecified atrial fibrillation: Secondary | ICD-10-CM | POA: Diagnosis not present

## 2020-07-31 DIAGNOSIS — Z79899 Other long term (current) drug therapy: Secondary | ICD-10-CM | POA: Diagnosis not present

## 2020-07-31 DIAGNOSIS — I2510 Atherosclerotic heart disease of native coronary artery without angina pectoris: Secondary | ICD-10-CM

## 2020-07-31 DIAGNOSIS — I5022 Chronic systolic (congestive) heart failure: Secondary | ICD-10-CM

## 2020-07-31 DIAGNOSIS — Z5181 Encounter for therapeutic drug level monitoring: Secondary | ICD-10-CM

## 2020-07-31 LAB — POCT INR: INR: 1.8 — AB (ref 2.0–3.0)

## 2020-07-31 LAB — BASIC METABOLIC PANEL
BUN/Creatinine Ratio: 13 (ref 12–28)
BUN: 14 mg/dL (ref 8–27)
CO2: 25 mmol/L (ref 20–29)
Calcium: 9.4 mg/dL (ref 8.7–10.3)
Chloride: 100 mmol/L (ref 96–106)
Creatinine, Ser: 1.04 mg/dL — ABNORMAL HIGH (ref 0.57–1.00)
GFR calc Af Amer: 57 mL/min/{1.73_m2} — ABNORMAL LOW (ref >59)
GFR calc non Af Amer: 49 mL/min/{1.73_m2} — ABNORMAL LOW (ref >59)
Glucose: 148 mg/dL — ABNORMAL HIGH (ref 65–99)
Potassium: 4.2 mmol/L (ref 3.5–5.2)
Sodium: 138 mmol/L (ref 134–144)

## 2020-07-31 LAB — CBC
Hematocrit: 38.6 % (ref 34.0–46.6)
Hemoglobin: 12.8 g/dL (ref 11.1–15.9)
MCH: 32.3 pg (ref 26.6–33.0)
MCHC: 33.2 g/dL (ref 31.5–35.7)
MCV: 98 fL — ABNORMAL HIGH (ref 79–97)
Platelets: 189 10*3/uL (ref 150–450)
RBC: 3.96 x10E6/uL (ref 3.77–5.28)
RDW: 13.2 % (ref 11.7–15.4)
WBC: 6.2 10*3/uL (ref 3.4–10.8)

## 2020-07-31 NOTE — Patient Instructions (Addendum)
After Visit Summary:  We will be checking the following labs today - BMET and CBC   Medication Instructions:    Continue with your current medicines.    If you need a refill on your cardiac medications before your next appointment, please call your pharmacy.     Testing/Procedures To Be Arranged:  N/A  Follow-Up:   See Dr. Marlou Porch in 6 months.     At [facility], you and your health needs are our priority.  As part of our continuing mission to provide you with exceptional heart care, we have created designated Provider Care Teams.  These Care Teams include your primary Cardiologist (physician) and Advanced Practice Providers (APPs -  Physician Assistants and Nurse Practitioners) who all work together to provide you with the care you need, when you need it.  Special Instructions:  . Stay safe, wash your hands for at least 20 seconds and wear a mask when needed.  . It was good to talk with you today.  Langley Gauss your lifeline - especially when you go outside   Call the [place] office at [phone] if you have any questions, problems or concerns.

## 2020-07-31 NOTE — Patient Instructions (Signed)
Description   Take an extra 1/2 tablet today (total of 1 tablet), then resume same dosage 1/2 tablet daily except for 1 tablet on Mondays and Fridays.  Recheck INR in 4 weeks.  Keep your normal green intake. Call our office if you have any questions or unusual bleeding [phone].

## 2020-08-01 ENCOUNTER — Telehealth: Payer: Self-pay | Admitting: *Deleted

## 2020-08-01 NOTE — Telephone Encounter (Signed)
Called pt's son, Richardson Landry, per message from [place] from result note.  Richardson Landry, son, states he could not understand why we decreased the pt's Warfarin dose on yesterday when she needs more Warfarin. Advised that we increased her dose for one time yesterday then back to 2.5mg  qd except 5mg  on Monday and Friday. He stated that he had been given her 2.5mg  daily except 5mg  Monday, Wednesday, and Friday. Asked if he has been receiving the print outs from the Warfarin visits and he stated no he has not because his brother has not given them to him. Advised that we changed her dose back on 03/27/2020 due to her having increased INR's and we ask at every visit the dose she has been taking and the dose of 2.5mg  daily except 5mg  on Monday and Friday was confirmed each time. He stated he fixes the meds and know she has been taking the 5mg  Monday, Wednesday, Friday and 2.5mg  all other days; advised that I glad he confirmed this and updated [place]. He states she did not take any extra yesterday, therefore, advised the pt to take an extra 1/2 tablet tomorrow since he will not be going back over there today, then resume her dose she has been taking which is 5mg  Monday, Wednesday, Friday and 2.5g all other days and he verbalized understanding. She has been therapeutic at 2.5 and 2.2 on this dose he has been given her. He states that she ate a nice serving of collards the day before so that could be the reason it is low advised it could be but we have that she missed Sunday's dose; and that the combination of both could be the reason. He states he does not know how that happened caused he fixed it and she takes it every morning while the caregiver is there. Advised that we have it documented that it was told at the visit. He stated that he will try to make sure she is taking everyday. Advised that we will send a sheet home at every visit unless the pt has to go to the lab and that he should be able to call with any questions as we keep our  number on the sheet.  He stated the brothers do have some issues and that they are going to communicate better on this as he understands the importance.  Therefore, advised the pt to take an extra 1/2 tablet tomorrow since he will not be going back over there today, then resume her dose she has been taking which is 5mg  Monday, Wednesday, Friday and 2.5mg  all other days and he verbalized understanding.

## 2020-08-10 DIAGNOSIS — Z79899 Other long term (current) drug therapy: Secondary | ICD-10-CM | POA: Diagnosis not present

## 2020-08-10 DIAGNOSIS — I2510 Atherosclerotic heart disease of native coronary artery without angina pectoris: Secondary | ICD-10-CM | POA: Diagnosis not present

## 2020-08-10 DIAGNOSIS — I4891 Unspecified atrial fibrillation: Secondary | ICD-10-CM | POA: Diagnosis not present

## 2020-08-10 DIAGNOSIS — G629 Polyneuropathy, unspecified: Secondary | ICD-10-CM | POA: Diagnosis not present

## 2020-08-10 DIAGNOSIS — Z6823 Body mass index (BMI) 23.0-23.9, adult: Secondary | ICD-10-CM | POA: Diagnosis not present

## 2020-08-10 DIAGNOSIS — E039 Hypothyroidism, unspecified: Secondary | ICD-10-CM | POA: Diagnosis not present

## 2020-08-10 DIAGNOSIS — M109 Gout, unspecified: Secondary | ICD-10-CM | POA: Diagnosis not present

## 2020-08-16 DIAGNOSIS — Z23 Encounter for immunization: Secondary | ICD-10-CM | POA: Diagnosis not present

## 2020-08-22 ENCOUNTER — Ambulatory Visit (INDEPENDENT_AMBULATORY_CARE_PROVIDER_SITE_OTHER): Payer: Medicare Other | Admitting: *Deleted

## 2020-08-22 ENCOUNTER — Other Ambulatory Visit: Payer: Self-pay

## 2020-08-22 DIAGNOSIS — I4891 Unspecified atrial fibrillation: Secondary | ICD-10-CM | POA: Diagnosis not present

## 2020-08-22 DIAGNOSIS — Z5181 Encounter for therapeutic drug level monitoring: Secondary | ICD-10-CM | POA: Diagnosis not present

## 2020-08-22 LAB — POCT INR: INR: 2.3 (ref 2.0–3.0)

## 2020-08-22 NOTE — Patient Instructions (Signed)
Description    Continue taking 1/2 tablet daily except for 1 tablet on Mondays, Wednesdays, and Fridays. Recheck INR in 5 weeks.  Keep your normal green intake. Call our office if you have any questions or unusual bleeding [phone].

## 2020-08-31 ENCOUNTER — Other Ambulatory Visit: Payer: Self-pay | Admitting: Cardiology

## 2020-09-26 ENCOUNTER — Other Ambulatory Visit: Payer: Self-pay

## 2020-09-26 ENCOUNTER — Ambulatory Visit (INDEPENDENT_AMBULATORY_CARE_PROVIDER_SITE_OTHER): Payer: Medicare Other | Admitting: *Deleted

## 2020-09-26 DIAGNOSIS — I4891 Unspecified atrial fibrillation: Secondary | ICD-10-CM

## 2020-09-26 DIAGNOSIS — Z5181 Encounter for therapeutic drug level monitoring: Secondary | ICD-10-CM | POA: Diagnosis not present

## 2020-09-26 LAB — POCT INR: INR: 1.7 — AB (ref 2.0–3.0)

## 2020-09-26 NOTE — Patient Instructions (Signed)
Description   Take 1.5 tablets today and then continue taking 1/2 tablet daily except for 1 tablet on Mondays, Wednesdays, and Fridays. Recheck INR in 3 weeks.  Keep your normal green intake. Call our office if you have any questions or unusual bleeding [phone].

## 2020-10-13 DIAGNOSIS — Z23 Encounter for immunization: Secondary | ICD-10-CM | POA: Diagnosis not present

## 2020-10-17 ENCOUNTER — Other Ambulatory Visit: Payer: Self-pay

## 2020-10-17 ENCOUNTER — Ambulatory Visit (INDEPENDENT_AMBULATORY_CARE_PROVIDER_SITE_OTHER): Payer: Medicare Other | Admitting: *Deleted

## 2020-10-17 DIAGNOSIS — Z5181 Encounter for therapeutic drug level monitoring: Secondary | ICD-10-CM

## 2020-10-17 DIAGNOSIS — I4891 Unspecified atrial fibrillation: Secondary | ICD-10-CM

## 2020-10-17 LAB — POCT INR: INR: 1.4 — AB (ref 2.0–3.0)

## 2020-10-17 NOTE — Patient Instructions (Signed)
Description   Take 1.5 tablets today and 1 tablet tomorrow then start taking 1 tablet daily except for 1/2 tablet Tuesdays, Thursdays, and Saturdays. Recheck INR in 3 weeks.  Keep your normal green intake. Call our office if you have any questions or unusual bleeding [phone].

## 2020-10-23 ENCOUNTER — Other Ambulatory Visit: Payer: Self-pay

## 2020-10-23 ENCOUNTER — Ambulatory Visit (INDEPENDENT_AMBULATORY_CARE_PROVIDER_SITE_OTHER): Payer: Medicare Other | Admitting: Pharmacist

## 2020-10-23 DIAGNOSIS — Z5181 Encounter for therapeutic drug level monitoring: Secondary | ICD-10-CM | POA: Diagnosis not present

## 2020-10-23 DIAGNOSIS — I4891 Unspecified atrial fibrillation: Secondary | ICD-10-CM | POA: Diagnosis not present

## 2020-10-23 LAB — POCT INR: INR: 2.6 (ref 2.0–3.0)

## 2020-10-23 NOTE — Patient Instructions (Signed)
Description   Continue taking 1 tablet daily except for 1/2 tablet Tuesdays, Thursdays, and Saturdays. Recheck INR in 3 weeks.  Keep your normal green intake. Call our office if you have any questions or unusual bleeding [phone].

## 2020-11-14 ENCOUNTER — Other Ambulatory Visit: Payer: Self-pay

## 2020-11-14 ENCOUNTER — Ambulatory Visit (INDEPENDENT_AMBULATORY_CARE_PROVIDER_SITE_OTHER): Payer: Medicare Other | Admitting: *Deleted

## 2020-11-14 DIAGNOSIS — I4891 Unspecified atrial fibrillation: Secondary | ICD-10-CM

## 2020-11-14 DIAGNOSIS — Z5181 Encounter for therapeutic drug level monitoring: Secondary | ICD-10-CM | POA: Diagnosis not present

## 2020-11-14 LAB — POCT INR: INR: 3.7 — AB (ref 2.0–3.0)

## 2020-11-14 NOTE — Patient Instructions (Signed)
Description   Hold warfarin tomorrow, then Continue taking 1 tablet daily except for 1/2 tablet Tuesdays, Thursdays, and Saturdays. Recheck INR in 3 weeks. Eat a extra serving of greens today. Call our office if you have any questions or unusual bleeding [phone].

## 2020-11-16 ENCOUNTER — Other Ambulatory Visit: Payer: Self-pay | Admitting: Cardiology

## 2020-12-05 ENCOUNTER — Other Ambulatory Visit: Payer: Self-pay

## 2020-12-05 ENCOUNTER — Ambulatory Visit (INDEPENDENT_AMBULATORY_CARE_PROVIDER_SITE_OTHER): Payer: Medicare Other

## 2020-12-05 DIAGNOSIS — Z5181 Encounter for therapeutic drug level monitoring: Secondary | ICD-10-CM

## 2020-12-05 DIAGNOSIS — I4891 Unspecified atrial fibrillation: Secondary | ICD-10-CM | POA: Diagnosis not present

## 2020-12-05 LAB — POCT INR: INR: 2.4 (ref 2.0–3.0)

## 2020-12-05 NOTE — Patient Instructions (Signed)
Description   Continue on same dosage 1 tablet daily except for 1/2 tablet Tuesdays, Thursdays, and Saturdays. Recheck INR in 4 weeks. Eat a extra serving of greens today. Call our office if you have any questions or unusual bleeding [phone].

## 2021-01-02 ENCOUNTER — Ambulatory Visit (INDEPENDENT_AMBULATORY_CARE_PROVIDER_SITE_OTHER): Payer: Medicare Other | Admitting: *Deleted

## 2021-01-02 ENCOUNTER — Other Ambulatory Visit: Payer: Self-pay

## 2021-01-02 DIAGNOSIS — Z5181 Encounter for therapeutic drug level monitoring: Secondary | ICD-10-CM

## 2021-01-02 DIAGNOSIS — I4891 Unspecified atrial fibrillation: Secondary | ICD-10-CM | POA: Diagnosis not present

## 2021-01-02 LAB — POCT INR: INR: 2.6 (ref 2.0–3.0)

## 2021-01-02 NOTE — Patient Instructions (Signed)
Description   Continue taking Warfarin 1 tablet daily except for 1/2 tablet Tuesdays, Thursdays, and Saturdays. Recheck INR in 5 weeks. Call our office if you have any questions or unusual bleeding [phone].

## 2021-01-18 ENCOUNTER — Ambulatory Visit (INDEPENDENT_AMBULATORY_CARE_PROVIDER_SITE_OTHER): Payer: Medicare Other | Admitting: Cardiology

## 2021-01-18 ENCOUNTER — Encounter: Payer: Self-pay | Admitting: Cardiology

## 2021-01-18 ENCOUNTER — Other Ambulatory Visit: Payer: Self-pay

## 2021-01-18 VITALS — BP 120/70 | HR 82 | Ht 67.0 in | Wt 150.0 lb

## 2021-01-18 DIAGNOSIS — Z5181 Encounter for therapeutic drug level monitoring: Secondary | ICD-10-CM

## 2021-01-18 DIAGNOSIS — I4819 Other persistent atrial fibrillation: Secondary | ICD-10-CM | POA: Diagnosis not present

## 2021-01-18 DIAGNOSIS — I5032 Chronic diastolic (congestive) heart failure: Secondary | ICD-10-CM

## 2021-01-18 NOTE — Progress Notes (Signed)
Cardiology Office Note:    Date:  01/18/2021   ID:  Natasha Livingston, DOB 02-14-1934, MRN [id number]  PCP:  [place], Nathan, [place]  Cardiologist:  Candee Furbish, MD  Advanced Practice Provider:  No care team member to display Electrophysiologist:  None       Referring MD: Cyndi Bender, PA-C   No chief complaint on file.   History of Present Illness:    Natasha Livingston is a 87 y.o. female here for the follow-up of atrial fibrillation CAD with CABG 2008 Dr. Darcey Nora.  Prior atrial flutter with 2 1 conduction RVR.  Has had an occasional fall.  Has been feeling shortness of breath.  She appeared fairly comfortable and then when she started talking about shortness of breath she began to have more labored respirations noted.  She says is especially bad when bending over and trying to put her shoes on.  She is compliant with her Lasix although sometimes she will hold the dose when going out from the house.  She has been more sedentary over the wintertime.  Here today with her son and grandson.  Her grandson just turned 36 today.  Past Medical History:  Diagnosis Date  . Atrial fibrillation ([place])   . Back pain   . Bruises easily   . Chest discomfort   . Diverticulitis   . Dizziness   . DOE (dyspnea on exertion)   . Epistaxis 06/16/2019  . Forgetfulness   . Hx of cardiovascular stress test    Lexiscan Myoview (12/15):  Apical lateral and anterolateral fixed defect, no ischemia, EF 54%; low risk  . Hyperlipidemia   . Hypertension   . Hypothyroidism   . Interstitial nephritis   . Ischemic heart disease   . MVP (mitral valve prolapse)   . Neuropathy     Past Surgical History:  Procedure Laterality Date  . CARDIOVERSION  08/2008  . CORONARY ARTERY BYPASS GRAFT  04/2007  . OVARIAN CYST REMOVAL      Current Medications: Current Meds  Medication Sig  . acetaminophen (TYLENOL) 500 MG tablet Take 500 mg by mouth every 6 (six) hours as  needed (For pain.).  Marland Kitchen allopurinol (ZYLOPRIM) 300 MG tablet Take 300 mg by mouth daily.   . colchicine 0.6 MG tablet Take 0.6 mg by mouth daily as needed (For gout flare-ups.).   Marland Kitchen diclofenac sodium (VOLTAREN) 1 % GEL Apply 2 g topically 4 (four) times daily as needed (For pain.).  Marland Kitchen ENTRESTO 24-26 MG TAKE 1 TABLET BY MOUTH TWICE A DAY  . fexofenadine (ALLEGRA) 60 MG tablet Take 60 mg by mouth 2 (two) times daily as needed for allergies or rhinitis.  . furosemide (LASIX) 40 MG tablet Take 1 tablet (40 mg total) by mouth 2 (two) times daily.  Marland Kitchen gabapentin (NEURONTIN) 800 MG tablet Take 400-800 mg by mouth See admin instructions. Take one half tablet (400 mg) am take one tablet (800 mg) pm  . hydrocortisone (ANUSOL-HC) 2.5 % rectal cream Place 1 application rectally 2 (two) times daily as needed for hemorrhoids.  Marland Kitchen KLOR-CON M20 20 MEQ tablet TAKE 2 TABLETS BY MOUTH EVERY DAY  . levothyroxine (SYNTHROID) 25 MCG tablet Take 25 mcg by mouth daily before breakfast.  . metoprolol succinate (TOPROL-XL) 50 MG 24 hr tablet TAKE 1 TABLET BY MOUTH TWICE A DAY WITH OR IMMEDIATELY FOLLOWING A MEAL  . nitroGLYCERIN (NITROSTAT) 0.4 MG SL tablet Place 1 tablet (0.4 mg total) under  the tongue every 5 (five) minutes as needed for chest pain.  Marland Kitchen warfarin (COUMADIN) 5 MG tablet TAKE AS DIRECTED BY COUMADIN CLINIC     Allergies:   Amiodarone, Crestor [rosuvastatin calcium], and Lipitor [atorvastatin calcium]   Social History   Socioeconomic History  . Marital status: Married    Spouse name: Not on file  . Number of children: Not on file  . Years of education: Not on file  . Highest education level: Not on file  Occupational History  . Not on file  Tobacco Use  . Smoking status: Never Smoker  . Smokeless tobacco: Never Used  Vaping Use  . Vaping Use: Never used  Substance and Sexual Activity  . Alcohol use: No  . Drug use: No  . Sexual activity: Never  Other Topics Concern  . Not on file  Social  History Narrative  . Not on file   Social Determinants of Health   Financial Resource Strain: Not on file  Food Insecurity: Not on file  Transportation Needs: Not on file  Physical Activity: Not on file  Stress: Not on file  Social Connections: Not on file     Family History: The patient's family history includes Cerebral aneurysm in her father and another family member; Hypertension in her father. There is no history of Heart attack or Stroke.  ROS:   Please see the history of present illness.     All other systems reviewed and are negative.  EKGs/Labs/Other Studies Reviewed:    The following studies were reviewed today:  ECHO 2019:  Echo Study Conclusions 02/2018  - Left ventricle: Inferobasal and posterior lateral hypokinesis  worse. Wall thickness was increased in a pattern of mild LVH.  Systolic function was moderately reduced. The estimated ejection  fraction was in the range of 35% to 40%. Left ventricular  diastolic function parameters were normal.  - Aortic valve: There was trivial regurgitation.  - Mitral valve: Moderately calcified annulus. Moderately thickened,  moderately calcified leaflets . There was mild regurgitation.  - Left atrium: The atrium was severely dilated.  - Right ventricle: The cavity size was mildly dilated.  - Right atrium: The atrium was severely dilated.  - Atrial septum: No defect or patent foramen ovale was identified.  - Tricuspid valve: There was severe regurgitation.   EKG:  EKG is  ordered today.  The ekg ordered today demonstrates AFIB 82  Recent Labs: 07/31/2020: BUN 14; Creatinine, Ser 1.04; Hemoglobin 12.8; Platelets 189; Potassium 4.2; Sodium 138  Recent Lipid Panel    Component Value Date/Time   CHOL 160 02/01/2016 1224   TRIG 185 (H) 02/01/2016 1224   HDL 29 (L) 02/01/2016 1224   CHOLHDL 5.5 (H) 02/01/2016 1224   VLDL 37 (H) 02/01/2016 1224   LDLCALC 94 02/01/2016 1224     Risk Assessment/Calculations:       Physical Exam:    VS:  BP 120/70 (BP Location: Left Arm, Patient Position: Sitting, Cuff Size: Normal)   Pulse 82   Ht 5\' 7"  (1.702 m)   Wt 150 lb (68 kg)   SpO2 94%   BMI 23.49 kg/m     Wt Readings from Last 3 Encounters:  01/18/21 150 lb (68 kg)  07/31/20 146 lb 6.4 oz (66.4 kg)  01/18/20 146 lb (66.2 kg)     GEN:  Well nourished, well developed in no acute distress HEENT: Normal NECK: No JVD; No carotid bruits LYMPHATICS: No lymphadenopathy CARDIAC: irreg, no murmurs, rubs, gallops RESPIRATORY:  Clear to auscultation without rales, wheezing or rhonchi  ABDOMEN: Soft, non-tender, non-distended MUSCULOSKELETAL:  No edema; No deformity  SKIN: Warm and dry NEUROLOGIC:  Alert and oriented x 3 PSYCHIATRIC:  Normal affect   ASSESSMENT:    1. Encounter for therapeutic drug monitoring   2. Chronic diastolic CHF (congestive heart failure) (HCC)   3. Persistent atrial fibrillation (HCC)    PLAN:    In order of problems listed above:  Chronic systolic heart failure -Seems fairly well compensated.  Remains on low-dose Entresto as well as beta-blocker.  She gets her Delene Loll [facility] continue with Lasix as well.  Continue with conditioning efforts for shortness of breath.  We will check lab work today.  Make sure that she is not anemic.  Persistent atrial fibrillation prior flutter -Managed with rate control warfarin.  Coronary artery disease status post CABG -No worrisome chest pain.  Doing well.  Falls -Need to be especially careful especially with Coumadin.  Hyperlipidemia -Has been statin intolerant.     Medication Adjustments/Labs and Tests Ordered: Current medicines are reviewed at length with the patient today.  Concerns regarding medicines are outlined above.  Orders Placed This Encounter  Procedures  . CBC  . Basic metabolic panel  . EKG 12-Lead   No orders of the defined types were placed in this encounter.   Patient  Instructions  Medication Instructions:  The current medical regimen is effective;  continue present plan and medications.  *If you need a refill on your cardiac medications before your next appointment, please call your pharmacy*  Lab Work: Please have blood work today (CBC, BMP)  If you have labs (blood work) drawn today and your tests are completely normal, you will receive your results only by: Marland Kitchen MyChart Message (if you have MyChart) OR . A paper copy in the mail If you have any lab test that is abnormal or we need to change your treatment, we will call you to review the results.  Follow-Up: At [facility], you and your health needs are our priority.  As part of our continuing mission to provide you with exceptional heart care, we have created designated Provider Care Teams.  These Care Teams include your primary Cardiologist (physician) and Advanced Practice Providers (APPs -  Physician Assistants and Nurse Practitioners) who all work together to provide you with the care you need, when you need it.  We recommend signing up for the patient portal called "MyChart".  Sign up information is provided on this After Visit Summary.  MyChart is used to connect with patients for Virtual Visits (Telemedicine).  Patients are able to view lab/test results, encounter notes, upcoming appointments, etc.  Non-urgent messages can be sent to your provider as well.   To learn more about what you can do with MyChart, go to [email or web address].    Your next appointment:   6 month(s)  The format for your next appointment:   In Person  Provider:   Candee Furbish, MD   Thank you for choosing [facility]!!        Signed, Candee Furbish, MD  01/18/2021 3:45 PM    [place]

## 2021-01-18 NOTE — Patient Instructions (Signed)
Medication Instructions:  The current medical regimen is effective;  continue present plan and medications.  *If you need a refill on your cardiac medications before your next appointment, please call your pharmacy*  Lab Work: Please have blood work today (CBC, BMP)  If you have labs (blood work) drawn today and your tests are completely normal, you will receive your results only by: Marland Kitchen MyChart Message (if you have MyChart) OR . A paper copy in the mail If you have any lab test that is abnormal or we need to change your treatment, we will call you to review the results.  Follow-Up: At [facility], you and your health needs are our priority.  As part of our continuing mission to provide you with exceptional heart care, we have created designated Provider Care Teams.  These Care Teams include your primary Cardiologist (physician) and Advanced Practice Providers (APPs -  Physician Assistants and Nurse Practitioners) who all work together to provide you with the care you need, when you need it.  We recommend signing up for the patient portal called "MyChart".  Sign up information is provided on this After Visit Summary.  MyChart is used to connect with patients for Virtual Visits (Telemedicine).  Patients are able to view lab/test results, encounter notes, upcoming appointments, etc.  Non-urgent messages can be sent to your provider as well.   To learn more about what you can do with MyChart, go to [email or web address].    Your next appointment:   6 month(s)  The format for your next appointment:   In Person  Provider:   Candee Furbish, MD   Thank you for choosing [facility]!!

## 2021-01-19 LAB — CBC
Hematocrit: 38.6 % (ref 34.0–46.6)
Hemoglobin: 13 g/dL (ref 11.1–15.9)
MCH: 32.9 pg (ref 26.6–33.0)
MCHC: 33.7 g/dL (ref 31.5–35.7)
MCV: 98 fL — ABNORMAL HIGH (ref 79–97)
Platelets: 180 10*3/uL (ref 150–450)
RBC: 3.95 x10E6/uL (ref 3.77–5.28)
RDW: 13.7 % (ref 11.7–15.4)
WBC: 6.7 10*3/uL (ref 3.4–10.8)

## 2021-01-19 LAB — BASIC METABOLIC PANEL
BUN/Creatinine Ratio: 16 (ref 12–28)
BUN: 17 mg/dL (ref 8–27)
CO2: 22 mmol/L (ref 20–29)
Calcium: 9.7 mg/dL (ref 8.7–10.3)
Chloride: 98 mmol/L (ref 96–106)
Creatinine, Ser: 1.06 mg/dL — ABNORMAL HIGH (ref 0.57–1.00)
Glucose: 127 mg/dL — ABNORMAL HIGH (ref 65–99)
Potassium: 4.3 mmol/L (ref 3.5–5.2)
Sodium: 137 mmol/L (ref 134–144)
eGFR: 52 mL/min/{1.73_m2} — ABNORMAL LOW (ref >59)

## 2021-02-06 ENCOUNTER — Ambulatory Visit (INDEPENDENT_AMBULATORY_CARE_PROVIDER_SITE_OTHER): Payer: Medicare Other | Admitting: *Deleted

## 2021-02-06 ENCOUNTER — Other Ambulatory Visit: Payer: Self-pay

## 2021-02-06 DIAGNOSIS — I4891 Unspecified atrial fibrillation: Secondary | ICD-10-CM

## 2021-02-06 DIAGNOSIS — Z5181 Encounter for therapeutic drug level monitoring: Secondary | ICD-10-CM

## 2021-02-06 LAB — POCT INR: INR: 2.5 (ref 2.0–3.0)

## 2021-02-06 NOTE — Patient Instructions (Signed)
Description   Continue taking Warfarin 1 tablet daily except for 1/2 tablet Tuesdays, Thursdays, and Saturdays. Recheck INR in 6 weeks. Call our office if you have any questions or unusual bleeding [phone].

## 2021-02-08 DIAGNOSIS — M109 Gout, unspecified: Secondary | ICD-10-CM | POA: Diagnosis not present

## 2021-02-08 DIAGNOSIS — E039 Hypothyroidism, unspecified: Secondary | ICD-10-CM | POA: Diagnosis not present

## 2021-02-08 DIAGNOSIS — L989 Disorder of the skin and subcutaneous tissue, unspecified: Secondary | ICD-10-CM | POA: Diagnosis not present

## 2021-02-08 DIAGNOSIS — G629 Polyneuropathy, unspecified: Secondary | ICD-10-CM | POA: Diagnosis not present

## 2021-02-08 DIAGNOSIS — Z139 Encounter for screening, unspecified: Secondary | ICD-10-CM | POA: Diagnosis not present

## 2021-02-08 DIAGNOSIS — I4891 Unspecified atrial fibrillation: Secondary | ICD-10-CM | POA: Diagnosis not present

## 2021-02-08 DIAGNOSIS — Z79899 Other long term (current) drug therapy: Secondary | ICD-10-CM | POA: Diagnosis not present

## 2021-02-08 DIAGNOSIS — Z9181 History of falling: Secondary | ICD-10-CM | POA: Diagnosis not present

## 2021-02-08 DIAGNOSIS — Z6824 Body mass index (BMI) 24.0-24.9, adult: Secondary | ICD-10-CM | POA: Diagnosis not present

## 2021-02-08 DIAGNOSIS — I2510 Atherosclerotic heart disease of native coronary artery without angina pectoris: Secondary | ICD-10-CM | POA: Diagnosis not present

## 2021-02-18 ENCOUNTER — Inpatient Hospital Stay (HOSPITAL_COMMUNITY)
Admission: EM | Admit: 2021-02-18 | Discharge: 2021-02-27 | DRG: 193 | Disposition: A | Discharge status: Skilled Nursing Facility | Payer: Medicare Other | Attending: Internal Medicine | Admitting: Internal Medicine

## 2021-02-18 ENCOUNTER — Other Ambulatory Visit: Payer: Self-pay

## 2021-02-18 ENCOUNTER — Emergency Department (HOSPITAL_COMMUNITY): Payer: Medicare Other

## 2021-02-18 ENCOUNTER — Encounter (HOSPITAL_COMMUNITY): Payer: Self-pay

## 2021-02-18 DIAGNOSIS — E1165 Type 2 diabetes mellitus with hyperglycemia: Secondary | ICD-10-CM | POA: Diagnosis not present

## 2021-02-18 DIAGNOSIS — I7789 Other specified disorders of arteries and arterioles: Secondary | ICD-10-CM | POA: Diagnosis not present

## 2021-02-18 DIAGNOSIS — Z8249 Family history of ischemic heart disease and other diseases of the circulatory system: Secondary | ICD-10-CM

## 2021-02-18 DIAGNOSIS — Z20822 Contact with and (suspected) exposure to covid-19: Secondary | ICD-10-CM | POA: Diagnosis not present

## 2021-02-18 DIAGNOSIS — J9811 Atelectasis: Secondary | ICD-10-CM | POA: Diagnosis not present

## 2021-02-18 DIAGNOSIS — I10 Essential (primary) hypertension: Secondary | ICD-10-CM | POA: Diagnosis not present

## 2021-02-18 DIAGNOSIS — I480 Paroxysmal atrial fibrillation: Secondary | ICD-10-CM | POA: Diagnosis not present

## 2021-02-18 DIAGNOSIS — I4891 Unspecified atrial fibrillation: Secondary | ICD-10-CM | POA: Diagnosis not present

## 2021-02-18 DIAGNOSIS — I5023 Acute on chronic systolic (congestive) heart failure: Secondary | ICD-10-CM | POA: Diagnosis not present

## 2021-02-18 DIAGNOSIS — E860 Dehydration: Secondary | ICD-10-CM | POA: Diagnosis not present

## 2021-02-18 DIAGNOSIS — J90 Pleural effusion, not elsewhere classified: Secondary | ICD-10-CM | POA: Diagnosis not present

## 2021-02-18 DIAGNOSIS — R0600 Dyspnea, unspecified: Secondary | ICD-10-CM

## 2021-02-18 DIAGNOSIS — G629 Polyneuropathy, unspecified: Secondary | ICD-10-CM | POA: Diagnosis not present

## 2021-02-18 DIAGNOSIS — J189 Pneumonia, unspecified organism: Secondary | ICD-10-CM | POA: Diagnosis not present

## 2021-02-18 DIAGNOSIS — G6289 Other specified polyneuropathies: Secondary | ICD-10-CM

## 2021-02-18 DIAGNOSIS — Z888 Allergy status to other drugs, medicaments and biological substances status: Secondary | ICD-10-CM

## 2021-02-18 DIAGNOSIS — R791 Abnormal coagulation profile: Secondary | ICD-10-CM | POA: Diagnosis present

## 2021-02-18 DIAGNOSIS — Z79890 Hormone replacement therapy (postmenopausal): Secondary | ICD-10-CM

## 2021-02-18 DIAGNOSIS — R739 Hyperglycemia, unspecified: Secondary | ICD-10-CM | POA: Diagnosis not present

## 2021-02-18 DIAGNOSIS — E119 Type 2 diabetes mellitus without complications: Secondary | ICD-10-CM

## 2021-02-18 DIAGNOSIS — I5042 Chronic combined systolic (congestive) and diastolic (congestive) heart failure: Secondary | ICD-10-CM

## 2021-02-18 DIAGNOSIS — R0602 Shortness of breath: Secondary | ICD-10-CM | POA: Diagnosis not present

## 2021-02-18 DIAGNOSIS — Z951 Presence of aortocoronary bypass graft: Secondary | ICD-10-CM

## 2021-02-18 DIAGNOSIS — R0902 Hypoxemia: Secondary | ICD-10-CM | POA: Diagnosis not present

## 2021-02-18 DIAGNOSIS — R531 Weakness: Secondary | ICD-10-CM | POA: Diagnosis not present

## 2021-02-18 DIAGNOSIS — R5383 Other fatigue: Secondary | ICD-10-CM | POA: Diagnosis not present

## 2021-02-18 DIAGNOSIS — R918 Other nonspecific abnormal finding of lung field: Secondary | ICD-10-CM | POA: Diagnosis not present

## 2021-02-18 DIAGNOSIS — E039 Hypothyroidism, unspecified: Secondary | ICD-10-CM | POA: Diagnosis present

## 2021-02-18 DIAGNOSIS — E785 Hyperlipidemia, unspecified: Secondary | ICD-10-CM | POA: Diagnosis present

## 2021-02-18 DIAGNOSIS — I517 Cardiomegaly: Secondary | ICD-10-CM | POA: Diagnosis not present

## 2021-02-18 DIAGNOSIS — I110 Hypertensive heart disease with heart failure: Secondary | ICD-10-CM | POA: Diagnosis not present

## 2021-02-18 DIAGNOSIS — Z79899 Other long term (current) drug therapy: Secondary | ICD-10-CM

## 2021-02-18 DIAGNOSIS — E1142 Type 2 diabetes mellitus with diabetic polyneuropathy: Secondary | ICD-10-CM | POA: Diagnosis present

## 2021-02-18 DIAGNOSIS — Z7901 Long term (current) use of anticoagulants: Secondary | ICD-10-CM

## 2021-02-18 LAB — CBC WITH DIFFERENTIAL/PLATELET
Abs Immature Granulocytes: 0.17 10*3/uL — ABNORMAL HIGH (ref 0.00–0.07)
Basophils Absolute: 0 10*3/uL (ref 0.0–0.1)
Basophils Relative: 0 %
Eosinophils Absolute: 0 10*3/uL (ref 0.0–0.5)
Eosinophils Relative: 0 %
HCT: 38.3 % (ref 36.0–46.0)
Hemoglobin: 12.6 g/dL (ref 12.0–15.0)
Immature Granulocytes: 1 %
Lymphocytes Relative: 7 %
Lymphs Abs: 0.9 10*3/uL (ref 0.7–4.0)
MCH: 33 pg (ref 26.0–34.0)
MCHC: 32.9 g/dL (ref 30.0–36.0)
MCV: 100.3 fL — ABNORMAL HIGH (ref 80.0–100.0)
Monocytes Absolute: 1.3 10*3/uL — ABNORMAL HIGH (ref 0.1–1.0)
Monocytes Relative: 10 %
Neutro Abs: 11.3 10*3/uL — ABNORMAL HIGH (ref 1.7–7.7)
Neutrophils Relative %: 82 %
Platelets: 221 10*3/uL (ref 150–400)
RBC: 3.82 MIL/uL — ABNORMAL LOW (ref 3.87–5.11)
RDW: 15.3 % (ref 11.5–15.5)
WBC: 13.7 10*3/uL — ABNORMAL HIGH (ref 4.0–10.5)
nRBC: 0.4 % — ABNORMAL HIGH (ref 0.0–0.2)

## 2021-02-18 LAB — COMPREHENSIVE METABOLIC PANEL
ALT: 31 U/L (ref 0–44)
AST: 36 U/L (ref 15–41)
Albumin: 3.7 g/dL (ref 3.5–5.0)
Alkaline Phosphatase: 156 U/L — ABNORMAL HIGH (ref 38–126)
Anion gap: 11 (ref 5–15)
BUN: 25 mg/dL — ABNORMAL HIGH (ref 8–23)
CO2: 24 mmol/L (ref 22–32)
Calcium: 9.6 mg/dL (ref 8.9–10.3)
Chloride: 102 mmol/L (ref 98–111)
Creatinine, Ser: 0.89 mg/dL (ref 0.44–1.00)
GFR, Estimated: >60 mL/min (ref >60)
Glucose, Bld: 189 mg/dL — ABNORMAL HIGH (ref 70–99)
Potassium: 4 mmol/L (ref 3.5–5.1)
Sodium: 137 mmol/L (ref 135–145)
Total Bilirubin: 3.7 mg/dL — ABNORMAL HIGH (ref 0.3–1.2)
Total Protein: 8.2 g/dL — ABNORMAL HIGH (ref 6.5–8.1)

## 2021-02-18 LAB — TROPONIN I (HIGH SENSITIVITY)
Troponin I (High Sensitivity): 13 ng/L (ref <18)
Troponin I (High Sensitivity): 11 ng/L (ref <18)

## 2021-02-18 LAB — URINALYSIS, ROUTINE W REFLEX MICROSCOPIC
Bilirubin Urine: NEGATIVE
Glucose, UA: NEGATIVE mg/dL
Ketones, ur: NEGATIVE mg/dL
Leukocytes,Ua: NEGATIVE
Nitrite: NEGATIVE
Protein, ur: NEGATIVE mg/dL
Specific Gravity, Urine: 1.009 (ref 1.005–1.030)
pH: 5 (ref 5.0–8.0)

## 2021-02-18 LAB — RESP PANEL BY RT-PCR (FLU A&B, COVID) ARPGX2
Influenza A by PCR: NEGATIVE
Influenza B by PCR: NEGATIVE
SARS Coronavirus 2 by RT PCR: NEGATIVE

## 2021-02-18 LAB — LACTIC ACID, PLASMA: Lactic Acid, Venous: 2.3 mmol/L (ref 0.5–1.9)

## 2021-02-18 LAB — BRAIN NATRIURETIC PEPTIDE: B Natriuretic Peptide: 458.2 pg/mL — ABNORMAL HIGH (ref 0.0–100.0)

## 2021-02-18 LAB — PROTIME-INR
INR: 5.5 (ref 0.8–1.2)
Prothrombin Time: 49.9 seconds — ABNORMAL HIGH (ref 11.4–15.2)

## 2021-02-18 LAB — MAGNESIUM: Magnesium: 2.1 mg/dL (ref 1.7–2.4)

## 2021-02-18 LAB — D-DIMER, QUANTITATIVE: D-Dimer, Quant: 1.26 ug/mL-FEU — ABNORMAL HIGH (ref 0.00–0.50)

## 2021-02-18 MED ORDER — METOPROLOL TARTRATE 5 MG/5ML IV SOLN: 2.5000 mg | Freq: Once | INTRAVENOUS | Status: AC

## 2021-02-18 MED ORDER — SODIUM CHLORIDE 0.9 % IV BOLUS: 500.0000 mL | Freq: Once | INTRAVENOUS | Status: DC

## 2021-02-18 MED ORDER — SODIUM CHLORIDE 0.9 % IV SOLN
2.0000 g | Freq: Once | INTRAVENOUS | Status: AC
  Administered 2021-02-18: 2 g via INTRAVENOUS
  Filled 2021-02-18 (×2): qty 20

## 2021-02-18 MED ORDER — SODIUM CHLORIDE 0.9 % IV SOLN
500.0000 mg | Freq: Once | INTRAVENOUS | Status: AC
  Administered 2021-02-18: 500 mg via INTRAVENOUS
  Filled 2021-02-18: qty 500

## 2021-02-18 MED ORDER — METOPROLOL TARTRATE 5 MG/5ML IV SOLN
INTRAVENOUS | Status: AC
  Administered 2021-02-18: 2.5 mg via INTRAVENOUS
  Filled 2021-02-18: qty 5

## 2021-02-18 MED ORDER — WARFARIN - PHARMACIST DOSING INPATIENT: Freq: Every day | Status: DC

## 2021-02-18 MED ORDER — SODIUM CHLORIDE 0.9 % IV BOLUS
500.0000 mL | Freq: Once | INTRAVENOUS | Status: AC
  Administered 2021-02-18: 500 mL via INTRAVENOUS

## 2021-02-18 NOTE — Plan of Care (Signed)
CT chest shows RUL pneumonia. Will continue IV Rocephin, po zithromax.

## 2021-02-18 NOTE — Progress Notes (Signed)
ANTICOAGULATION CONSULT NOTE - Initial Consult  Pharmacy Consult for Coumadin Indication: atrial fibrillation  Allergies  Allergen Reactions  . Amiodarone Other (See Comments)    unknown  . Crestor [Rosuvastatin Calcium] Other (See Comments)    unknown  . Lipitor [Atorvastatin Calcium] Swelling    Patient Measurements: Height: 5\' 7"  (170.2 cm) Weight: 67.1 kg (148 lb) IBW/kg (Calculated) : 61.6  Vital Signs: Temp: 98.9 F (37.2 C) (04/18 1807) BP: 124/87 (04/18 2130) Pulse Rate: 102 (04/18 2130)  Labs: Recent Labs    02/18/21 1833 02/18/21 2051  HGB 12.6  --   HCT 38.3  --   PLT 221  --   CREATININE 0.89  --   TROPONINIHS 13 11    Estimated Creatinine Clearance: 44.9 mL/min (by C-G formula based on SCr of 0.89 mg/dL).   Medical History: Past Medical History:  Diagnosis Date  . Atrial fibrillation ([place])   . Back pain   . Bruises easily   . Chest discomfort   . Diverticulitis   . Dizziness   . DOE (dyspnea on exertion)   . Epistaxis 06/16/2019  . Forgetfulness   . Hx of cardiovascular stress test    Lexiscan Myoview (12/15):  Apical lateral and anterolateral fixed defect, no ischemia, EF 54%; low risk  . Hyperlipidemia   . Hypertension   . Hypothyroidism   . Interstitial nephritis   . Ischemic heart disease   . MVP (mitral valve prolapse)   . Neuropathy     Medications:  Warfarin PTA- 5mg  daily except 2.5mg  on Tues/Thur/Sat.  LD 4/18 @ 10a  Assessment: [name] with hx of afib on Coumadin.  INR was SUPRAtherapeutic on admission at 5.5.  Elevated INR likely due to acute illness & poor oral intake for past several days. Rocephin + Zithromax given in ED which can also interact to elevate INR.    Of note, patient has been on current outpatient regimen since Dec 2021 with mostly therapeutic INR.   CBC WNL, no bleeding noted.   Goal of Therapy:  INR 2-3   Plan:  Hold Coumadin for elevated INR Daily PT/INR Monitor closely for s/sx of  bleeding  Netta Cedars PharmD, BCPS 02/18/2021,10:20 PM

## 2021-02-18 NOTE — ED Provider Notes (Signed)
[facility] DEPT Provider Note   CSN: [id number] Arrival date & time: 02/18/21  1739     History Chief Complaint  Patient presents with  . Weakness    Natasha Livingston is a 87 y.o. female.  Patient is 87 years old with a history of atrophic hypertension hyperlipidemia.  According to her son she has been more confused and short of breath with cough.  She is also had significant weakness  The history is provided by the patient and medical records. No language interpreter was used.  Shortness of Breath Severity:  Moderate Onset quality:  Sudden Timing:  Constant Progression:  Worsening Chronicity:  New Context: activity   Relieved by:  Nothing Worsened by:  Coughing Ineffective treatments:  None tried Associated symptoms: no abdominal pain, no chest pain, no cough, no headaches and no rash        Past Medical History:  Diagnosis Date  . Atrial fibrillation ([place])   . Back pain   . Bruises easily   . Chest discomfort   . Diverticulitis   . Dizziness   . DOE (dyspnea on exertion)   . Epistaxis 06/16/2019  . Forgetfulness   . Hx of cardiovascular stress test    Lexiscan Myoview (12/15):  Apical lateral and anterolateral fixed defect, no ischemia, EF 54%; low risk  . Hyperlipidemia   . Hypertension   . Hypothyroidism   . Interstitial nephritis   . Ischemic heart disease   . MVP (mitral valve prolapse)   . Neuropathy     Patient Active Problem List   Diagnosis Date Noted  . Onychomycosis of left great toe 09/10/2015  . Encounter for therapeutic drug monitoring 12/07/2013  . Depression 12/07/2013  . Paroxysmal atrial fibrillation ([place]) 08/18/2013  . Hypothyroid 04/20/2013  . Dermatitis 12/31/2012  . Benign hypertensive heart disease without heart failure 12/24/2011  . Long term (current) use of anticoagulants 08/27/2011  . Edema of foot 04/28/2011  . Malaise and fatigue 04/15/2011  . Chest discomfort   . Dizziness   . MVP  (mitral valve prolapse)   . Dyspnea   . Bruises easily   . Back pain   . Forgetfulness   . Atrial fibrillation ([place])   . Hypertension   . Hypothyroidism   . Aortic stenosis   . Hyperlipidemia   . Diverticulitis   . Interstitial nephritis   . Neuropathy   . Ischemic heart disease   . Atrial fibrillation ([place]) 02/03/2011    Past Surgical History:  Procedure Laterality Date  . CARDIOVERSION  08/2008  . CORONARY ARTERY BYPASS GRAFT  04/2007  . OVARIAN CYST REMOVAL       OB History   No obstetric history on file.     Family History  Problem Relation Age of Onset  . Cerebral aneurysm Father   . Hypertension Father   . Cerebral aneurysm Other   . Heart attack Neg Hx   . Stroke Neg Hx     Social History   Tobacco Use  . Smoking status: Never Smoker  . Smokeless tobacco: Never Used  Vaping Use  . Vaping Use: Never used  Substance Use Topics  . Alcohol use: No  . Drug use: No    Home Medications Prior to Admission medications   Medication Sig Start Date End Date Taking? Authorizing Provider  acetaminophen (TYLENOL) 500 MG tablet Take 500 mg by mouth every 6 (six) hours as needed (For pain.).    [provider]  allopurinol (ZYLOPRIM) 300 MG tablet Take 300 mg by mouth daily.  01/03/18   [provider]  colchicine 0.6 MG tablet Take 0.6 mg by mouth daily as needed (For gout flare-ups.).     [provider]  diclofenac sodium (VOLTAREN) 1 % GEL Apply 2 g topically 4 (four) times daily as needed (For pain.).    [provider]  ENTRESTO 24-26 MG TAKE 1 TABLET BY MOUTH TWICE A DAY 11/16/20   Jerline Pain, MD  fexofenadine (ALLEGRA) 60 MG tablet Take 60 mg by mouth 2 (two) times daily as needed for allergies or rhinitis.    [provider]  furosemide (LASIX) 40 MG tablet Take 1 tablet (40 mg total) by mouth 2 (two) times daily. 05/18/20   Jerline Pain, MD  gabapentin (NEURONTIN) 800 MG tablet Take 400-800 mg by mouth See admin  instructions. Take one half tablet (400 mg) am take one tablet (800 mg) pm    [provider]  hydrocortisone (ANUSOL-HC) 2.5 % rectal cream Place 1 application rectally 2 (two) times daily as needed for hemorrhoids.    [provider]  KLOR-CON M20 20 MEQ tablet TAKE 2 TABLETS BY MOUTH EVERY DAY 02/06/20   Burtis Junes, NP  levothyroxine (SYNTHROID) 25 MCG tablet Take 25 mcg by mouth daily before breakfast.    [provider]  metoprolol succinate (TOPROL-XL) 50 MG 24 hr tablet TAKE 1 TABLET BY MOUTH TWICE A DAY WITH OR IMMEDIATELY FOLLOWING A MEAL 05/21/20   Burtis Junes, NP  nitroGLYCERIN (NITROSTAT) 0.4 MG SL tablet Place 1 tablet (0.4 mg total) under the tongue every 5 (five) minutes as needed for chest pain. 03/17/17   Jerline Pain, MD  warfarin (COUMADIN) 5 MG tablet TAKE AS DIRECTED BY COUMADIN CLINIC 08/31/20   Jerline Pain, MD    Allergies    Amiodarone, Crestor [rosuvastatin calcium], and Lipitor [atorvastatin calcium]  Review of Systems   Review of Systems  Constitutional: Negative for appetite change and fatigue.  HENT: Negative for congestion, ear discharge and sinus pressure.   Eyes: Negative for discharge.  Respiratory: Positive for shortness of breath. Negative for cough.   Cardiovascular: Negative for chest pain.  Gastrointestinal: Negative for abdominal pain and diarrhea.  Genitourinary: Negative for frequency and hematuria.  Musculoskeletal: Negative for back pain.  Skin: Negative for rash.  Neurological: Negative for seizures and headaches.  Psychiatric/Behavioral: Negative for hallucinations.    Physical Exam Updated Vital Signs BP 119/78   Pulse (!) 104   Temp 98.9 F (37.2 C)   Resp 20   Ht 5\' 7"  (1.702 m)   Wt 67.1 kg   SpO2 95%   BMI 23.18 kg/m   Physical Exam Vitals and nursing note reviewed.  Constitutional:      Appearance: She is well-developed.  HENT:     Head: Normocephalic.     Nose: Nose normal.  Eyes:      General: No scleral icterus.    Conjunctiva/sclera: Conjunctivae normal.  Neck:     Thyroid: No thyromegaly.  Cardiovascular:     Rate and Rhythm: Tachycardia present. Rhythm irregular.     Heart sounds: No murmur heard. No friction rub. No gallop.   Pulmonary:     Breath sounds: No stridor. No wheezing or rales.     Comments:    Tachypnea Chest:     Chest wall: No tenderness.  Abdominal:     General: There is no distension.  Tenderness: There is no abdominal tenderness. There is no rebound.  Musculoskeletal:        General: Normal range of motion.     Cervical back: Neck supple.  Lymphadenopathy:     Cervical: No cervical adenopathy.  Skin:    Findings: No erythema or rash.  Neurological:     Mental Status: She is alert and oriented to person, place, and time.     Motor: No abnormal muscle tone.     Coordination: Coordination normal.  Psychiatric:        Behavior: Behavior normal.     ED Results / Procedures / Treatments   Labs (all labs ordered are listed, but only abnormal results are displayed) Labs Reviewed  CBC WITH DIFFERENTIAL/PLATELET - Abnormal; Notable for the following components:      Result Value   WBC 13.7 (*)    RBC 3.82 (*)    MCV 100.3 (*)    nRBC 0.4 (*)    Neutro Abs 11.3 (*)    Monocytes Absolute 1.3 (*)    Abs Immature Granulocytes 0.17 (*)    All other components within normal limits  COMPREHENSIVE METABOLIC PANEL - Abnormal; Notable for the following components:   Glucose, Bld 189 (*)    BUN 25 (*)    Total Protein 8.2 (*)    Alkaline Phosphatase 156 (*)    Total Bilirubin 3.7 (*)    All other components within normal limits  BRAIN NATRIURETIC PEPTIDE - Abnormal; Notable for the following components:   B Natriuretic Peptide 458.2 (*)    All other components within normal limits  D-DIMER, QUANTITATIVE - Abnormal; Notable for the following components:   D-Dimer, Quant 1.26 (*)    All other components within normal limits   CULTURE, BLOOD (ROUTINE X 2)  CULTURE, BLOOD (ROUTINE X 2)  RESP PANEL BY RT-PCR (FLU A&B, COVID) ARPGX2  URINALYSIS, ROUTINE W REFLEX MICROSCOPIC  LACTIC ACID, PLASMA  TROPONIN I (HIGH SENSITIVITY)  TROPONIN I (HIGH SENSITIVITY)    EKG EKG Interpretation  Date/Time:  Monday February 18 2021 19:47:53 EDT Ventricular Rate:  119 PR Interval:    QRS Duration: 99 QT Interval:  363 QTC Calculation: 511 R Axis:   107 Text Interpretation: Atrial fibrillation Probable lateral infarct, age indeterminate Prolonged QT interval Confirmed by Milton Ferguson [phone]) on 02/18/2021 9:04:07 PM   Radiology DG Chest Port 1 View  Result Date: 02/18/2021 CLINICAL DATA:  Weakness since Friday, dehydration, shortness of breath EXAM: PORTABLE CHEST 1 VIEW COMPARISON:  Portable exam 1835 hours compared to 09/21/2015 FINDINGS: Enlargement of cardiac silhouette post CABG. Mediastinal contours and pulmonary vascularity normal. RIGHT upper lobe infiltrate consistent with pneumonia. Minimal atelectasis or infiltrate at LEFT base. No pleural effusion or pneumothorax. Bones demineralized. IMPRESSION: RIGHT upper lobe infiltrate consistent with pneumonia. Mild atelectasis versus infiltrate LEFT lower lobe. Enlargement of cardiac silhouette post CABG. Electronically Signed   By: Lavonia Dana M.D.   On: 02/18/2021 19:01    Procedures Procedures   Medications Ordered in ED Medications  cefTRIAXone (ROCEPHIN) 2 g in sodium chloride 0.9 % 100 mL IVPB (has no administration in time range)  azithromycin (ZITHROMAX) 500 mg in sodium chloride 0.9 % 250 mL IVPB (has no administration in time range)  sodium chloride 0.9 % bolus 500 mL (0 mLs Intravenous Stopped 02/18/21 1904)    ED Course  I have reviewed the triage vital signs and the nursing notes.  Pertinent labs & imaging results that were available during my  care of the patient were reviewed by me and considered in my medical decision making (see chart for  details).    MDM Rules/Calculators/A&P                          Patient with a community-acquired pneumonia with tachycardia and tachypnea.  She will be admitted to medicine Final Clinical Impression(s) / ED Diagnoses Final diagnoses:  Community acquired pneumonia of right upper lobe of lung    Rx / DC Orders ED Discharge Orders    None       Milton Ferguson, MD 02/18/21 2117

## 2021-02-18 NOTE — Subjective & Objective (Signed)
CC: dyspnea HPI: [name] with hx of afib, on Coumadin, hypertension, chronic systolic heart failure last known EF in 2019 of 35%, neuropathy presents to the ER today with dyspnea for the last 3 days.  Son(Chris) states that patient was in the backyard this past week inhaling a bunch of pollen.  She started coughing.  She basically spent good Friday, Saturday and Easter standing in bed.  She is ate and drank very little.  Patient has not had any nausea or vomiting.  No diarrhea.  No fevers.  Patient's had a slight cough.  Son thinks the patient has lots of postnasal drainage.  Patient lives at home alone.  She has caretakers during the day.  Son brought the patient to the ER at the request of the patient's primary care doctor.  In the ER, patient was afebrile.  Temp 98.9.  She was tachycardic between 103-111 bpm.  Initial room air saturations in the lobby were reportedly 82%, but this quickly increased to 95% on room air when the patient was brought back into the examination room.  Laboratory evaluation in the ER, white count elevated at 13.7.  Beta peptide increased at 458.  No baseline.  Chemistry is unremarkable except for an elevated blood glucose of 189.  Patient without a history of diabetes.  Chest x-ray demonstrated possible right upper lobe infiltrates.  Son states the patient is alert and oriented x3 however she was confused and that she thought her grandson had accompanied her and her son to the ER with her.  Patient's son [name] states the patient's grandson is not with him.  Patient has remained slightly tachycardic with heart rates ranging from 105-110 bpm.  Patient given 200 cc of normal saline ER.  INR was not checked.  COVID PCR was not checked.  Lactic acid was not checked.

## 2021-02-18 NOTE — H&P (Signed)
History and Physical    Drisana Schweickert AOZ:[id number] DOB: [birth year] DOA: 02/18/2021  PCP: Cyndi Bender, PA-C   Patient coming from: Home  I have personally briefly reviewed patient's old medical records in [place]  CC: dyspnea HPI: [name] with hx of afib, on Coumadin, hypertension, chronic systolic heart failure last known EF in 2019 of 35%, neuropathy presents to the ER today with dyspnea for the last 3 days.  Son(Chris) states that patient was in the backyard this past week inhaling a bunch of pollen.  She started coughing.  She basically spent good Friday, Saturday and Easter standing in bed.  She is ate and drank very little.  Patient has not had any nausea or vomiting.  No diarrhea.  No fevers.  Patient's had a slight cough.  Son thinks the patient has lots of postnasal drainage.  Patient lives at home alone.  She has caretakers during the day.  Son brought the patient to the ER at the request of the patient's primary care doctor.  In the ER, patient was afebrile.  Temp 98.9.  She was tachycardic between 103-111 bpm.  Initial room air saturations in the lobby were reportedly 82%, but this quickly increased to 95% on room air when the patient was brought back into the examination room.  Laboratory evaluation in the ER, white count elevated at 13.7.  Beta peptide increased at 458.  No baseline.  Chemistry is unremarkable except for an elevated blood glucose of 189.  Patient without a history of diabetes.  Chest x-ray demonstrated possible right upper lobe infiltrates.  Son states the patient is alert and oriented x3 however she was confused and that she thought her grandson had accompanied her and her son to the ER with her.  Patient's son [name] states the patient's grandson is not with him.  Patient has remained slightly tachycardic with heart rates ranging from 105-110 bpm.  Patient given 200 cc of normal saline ER.  INR was not checked.  COVID PCR was not checked.   Lactic acid was not checked.    ED Course: EKG showed rapid afib. IVF 500 ml bolus ordered. Blood cx ordered. IV Rocephin ordered. No covid PCR, INR or rate controlling agents ordered.  Review of Systems:  Review of Systems  Constitutional: Positive for malaise/fatigue. Negative for chills and fever.  HENT: Positive for congestion.        Post-nasal drip  Eyes: Negative.   Respiratory: Positive for cough and shortness of breath.   Cardiovascular: Negative.  Negative for leg swelling and PND.  Gastrointestinal: Negative.   Genitourinary: Negative.   Musculoskeletal: Negative.   Neurological: Positive for weakness.  Endo/Heme/Allergies: Negative.   Psychiatric/Behavioral: Negative.   All other systems reviewed and are negative.   Past Medical History:  Diagnosis Date  . Atrial fibrillation ([place])   . Back pain   . Bruises easily   . Chest discomfort   . Diverticulitis   . Dizziness   . DOE (dyspnea on exertion)   . Epistaxis 06/16/2019  . Forgetfulness   . Hx of cardiovascular stress test    Lexiscan Myoview (12/15):  Apical lateral and anterolateral fixed defect, no ischemia, EF 54%; low risk  . Hyperlipidemia   . Hypertension   . Hypothyroidism   . Interstitial nephritis   . Ischemic heart disease   . MVP (mitral valve prolapse)   . Neuropathy     Past Surgical History:  Procedure Laterality Date  .  CARDIOVERSION  08/2008  . CORONARY ARTERY BYPASS GRAFT  04/2007  . OVARIAN CYST REMOVAL       reports that she has never smoked. She has never used smokeless tobacco. She reports that she does not drink alcohol and does not use drugs.  Allergies  Allergen Reactions  . Amiodarone Other (See Comments)    unknown  . Crestor [Rosuvastatin Calcium] Other (See Comments)    unknown  . Lipitor [Atorvastatin Calcium] Swelling    Family History  Problem Relation Age of Onset  . Cerebral aneurysm Father   . Hypertension Father   . Cerebral aneurysm Other   . Heart  attack Neg Hx   . Stroke Neg Hx     Prior to Admission medications   Medication Sig Start Date End Date Taking? Authorizing Provider  acetaminophen (TYLENOL) 500 MG tablet Take 500 mg by mouth every 6 (six) hours as needed (For pain.).    [provider]  allopurinol (ZYLOPRIM) 300 MG tablet Take 300 mg by mouth daily.  01/03/18   [provider]  colchicine 0.6 MG tablet Take 0.6 mg by mouth daily as needed (For gout flare-ups.).     [provider]  diclofenac sodium (VOLTAREN) 1 % GEL Apply 2 g topically 4 (four) times daily as needed (For pain.).    [provider]  ENTRESTO 24-26 MG TAKE 1 TABLET BY MOUTH TWICE A DAY 11/16/20   Jerline Pain, MD  fexofenadine (ALLEGRA) 60 MG tablet Take 60 mg by mouth 2 (two) times daily as needed for allergies or rhinitis.    [provider]  furosemide (LASIX) 40 MG tablet Take 1 tablet (40 mg total) by mouth 2 (two) times daily. 05/18/20   Jerline Pain, MD  gabapentin (NEURONTIN) 800 MG tablet Take 400-800 mg by mouth See admin instructions. Take one half tablet (400 mg) am take one tablet (800 mg) pm    [provider]  hydrocortisone (ANUSOL-HC) 2.5 % rectal cream Place 1 application rectally 2 (two) times daily as needed for hemorrhoids.    [provider]  KLOR-CON M20 20 MEQ tablet TAKE 2 TABLETS BY MOUTH EVERY DAY 02/06/20   Burtis Junes, NP  levothyroxine (SYNTHROID) 25 MCG tablet Take 25 mcg by mouth daily before breakfast.    [provider]  metoprolol succinate (TOPROL-XL) 50 MG 24 hr tablet TAKE 1 TABLET BY MOUTH TWICE A DAY WITH OR IMMEDIATELY FOLLOWING A MEAL 05/21/20   Burtis Junes, NP  nitroGLYCERIN (NITROSTAT) 0.4 MG SL tablet Place 1 tablet (0.4 mg total) under the tongue every 5 (five) minutes as needed for chest pain. 03/17/17   Jerline Pain, MD  warfarin (COUMADIN) 5 MG tablet TAKE AS DIRECTED BY COUMADIN CLINIC 08/31/20   Jerline Pain, MD    Physical  Exam: Vitals:   02/18/21 2045 02/18/21 2100 02/18/21 2115 02/18/21 2130  BP: (!) 131/92 119/78 124/88 124/87  Pulse: 100 (!) 104 (!) 105 (!) 102  Resp: (!) 35 20 (!) 36 14  Temp:      SpO2: 95% 95% 94% 91%  Weight:      Height:        Physical Exam Vitals and nursing note reviewed.  Constitutional:      General: She is not in acute distress.    Appearance: Normal appearance. She is normal weight. She is not ill-appearing, toxic-appearing or diaphoretic.  HENT:     Head: Normocephalic and atraumatic.  Nose: Congestion present.  Eyes:     General:        Right eye: No discharge.        Left eye: No discharge.  Cardiovascular:     Rate and Rhythm: Tachycardia present. Rhythm irregular.     Pulses: Normal pulses.  Pulmonary:     Effort: No tachypnea, prolonged expiration or respiratory distress.     Breath sounds: Examination of the left-upper field reveals rales. Examination of the right-lower field reveals decreased breath sounds. Examination of the left-lower field reveals decreased breath sounds. Decreased breath sounds and rales present. No wheezing.  Abdominal:     General: Abdomen is flat. Bowel sounds are normal. There is no distension.     Tenderness: There is no abdominal tenderness. There is no guarding.  Musculoskeletal:     Right lower leg: No edema.     Left lower leg: No edema.  Skin:    General: Skin is warm and dry.     Capillary Refill: Capillary refill takes less than 2 seconds.  Neurological:     General: No focal deficit present.     Mental Status: She is alert and oriented to person, place, and time.      Labs on Admission: I have personally reviewed following labs and imaging studies  CBC: Recent Labs  Lab 02/18/21 1833  WBC 13.7*  NEUTROABS 11.3*  HGB 12.6  HCT 38.3  MCV 100.3*  PLT 193   Basic Metabolic Panel: Recent Labs  Lab 02/18/21 1833  NA 137  K 4.0  CL 102  CO2 24  GLUCOSE 189*  BUN 25*  CREATININE 0.89  CALCIUM 9.6    GFR: Estimated Creatinine Clearance: 44.9 mL/min (by C-G formula based on SCr of 0.89 mg/dL). Liver Function Tests: Recent Labs  Lab 02/18/21 1833  AST 36  ALT 31  ALKPHOS 156*  BILITOT 3.7*  PROT 8.2*  ALBUMIN 3.7   No results for input(s): LIPASE, AMYLASE in the last 168 hours. No results for input(s): AMMONIA in the last 168 hours. Coagulation Profile: No results for input(s): INR, PROTIME in the last 168 hours. Cardiac Enzymes: No results for input(s): CKTOTAL, CKMB, CKMBINDEX, TROPONINI in the last 168 hours. BNP (last 3 results) No results for input(s): PROBNP in the last 8760 hours. HbA1C: No results for input(s): HGBA1C in the last 72 hours. CBG: No results for input(s): GLUCAP in the last 168 hours. Lipid Profile: No results for input(s): CHOL, HDL, LDLCALC, TRIG, CHOLHDL, LDLDIRECT in the last 72 hours. Thyroid Function Tests: No results for input(s): TSH, T4TOTAL, FREET4, T3FREE, THYROIDAB in the last 72 hours. Anemia Panel: No results for input(s): VITAMINB12, FOLATE, FERRITIN, TIBC, IRON, RETICCTPCT in the last 72 hours. Urine analysis:    Component Value Date/Time   COLORURINE YELLOW 04/16/2007 1109   APPEARANCEUR CLEAR 04/16/2007 1109   LABSPEC 1.017 04/16/2007 1109   PHURINE 6.5 04/16/2007 1109   GLUCOSEU NEGATIVE 04/16/2007 1109   [place] 04/16/2007 1109   [employer] 04/16/2007 1109   [place] 04/16/2007 1109   PROTEINUR NEGATIVE 04/16/2007 1109   UROBILINOGEN 0.2 04/16/2007 1109   NITRITE NEGATIVE 04/16/2007 1109   LEUKOCYTESUR TRACE 04/16/2007 1109    Radiological Exams on Admission: I have personally reviewed images DG Chest Port 1 View  Result Date: 02/18/2021 CLINICAL DATA:  Weakness since Friday, dehydration, shortness of breath EXAM: PORTABLE CHEST 1 VIEW COMPARISON:  Portable exam 1835 hours compared to 09/21/2015 FINDINGS: Enlargement of cardiac silhouette post  CABG. Mediastinal contours and pulmonary  vascularity normal. RIGHT upper lobe infiltrate consistent with pneumonia. Minimal atelectasis or infiltrate at LEFT base. No pleural effusion or pneumothorax. Bones demineralized. IMPRESSION: RIGHT upper lobe infiltrate consistent with pneumonia. Mild atelectasis versus infiltrate LEFT lower lobe. Enlargement of cardiac silhouette post CABG. Electronically Signed   By: Lavonia Dana M.D.   On: 02/18/2021 19:01    EKG: I have personally reviewed EKG: rapid afib. HR 115  Assessment/Plan Principal Problem:   Acute dyspnea Active Problems:   Paroxysmal atrial fibrillation (HCC)   Chronic combined systolic and diastolic CHF (congestive heart failure) (HCC) - echo 02-2018 LVEF 35-40%   Hypertension   Neuropathy   Long term (current) use of anticoagulants - on coumadin for afib   Hyperglycemia    Acute dyspnea Assigned to observation status.  Unclear if the patient's dyspnea is related to her rapid atrial fibrillation or pneumonia.  We will check a noncontrasted chest CT determine if she has actually has any infiltrates.  Also check an INR to see if she is therapeutic with her Coumadin.  Doubtful she has a PE.  Other possibilities include acute congestive heart failure although the patient does not have any peripheral edema.  Son states the patient has been taking her Lasix.  1 dose of IV antibiotics have provided including Rocephin and Zithromax.  We will determine if the patient needs any further antibiotic coverage after her chest CT.  Paroxysmal atrial fibrillation (HCC) Patient currently has rapid atrial fibrillation with a heart rate of 105-110 bpm.  2.5 mg of IV Lopressor ordered.  We will update her echo as it has been nearly 3 years since her last one.  Chronic combined systolic and diastolic CHF (congestive heart failure) ([place]) - echo 02-2018 LVEF 35-40% We will update her echo.  Chest CT will help determine if the patient has acute congestive heart failure.  Based on examination, patient has  no JVD or peripheral edema.  Hypertension Stable.  Neuropathy Continue Neurontin.  Long term (current) use of anticoagulants - on coumadin for afib Check INR tonight.  Continue Coumadin.  Target INR between 2 and 3.  Hyperglycemia Given hyperglycemia noted on several chemistries, will check A1c.   DVT prophylaxis: Coumadin Code Status: Full Code Family Communication: discussed with son Christ at bedside  Disposition Plan: DC to home  Consults called: none  Admission status: Observation, Telemetry bed   Kristopher Oppenheim, DO [facility] 02/18/2021, 9:59 PM

## 2021-02-18 NOTE — Assessment & Plan Note (Signed)
Assigned to observation status.  Unclear if the patient's dyspnea is related to her rapid atrial fibrillation or pneumonia.  We will check a noncontrasted chest CT determine if she has actually has any infiltrates.  Also check an INR to see if she is therapeutic with her Coumadin.  Doubtful she has a PE.  Other possibilities include acute congestive heart failure although the patient does not have any peripheral edema.  Son states the patient has been taking her Lasix.  1 dose of IV antibiotics have provided including Rocephin and Zithromax.  We will determine if the patient needs any further antibiotic coverage after her chest CT.

## 2021-02-18 NOTE — Assessment & Plan Note (Signed)
We will update her echo.  Chest CT will help determine if the patient has acute congestive heart failure.  Based on examination, patient has no JVD or peripheral edema.

## 2021-02-18 NOTE — Assessment & Plan Note (Signed)
Check INR tonight.  Continue Coumadin.  Target INR between 2 and 3.

## 2021-02-18 NOTE — ED Triage Notes (Signed)
Pt was brought in due to weakness that began Friday. Per Son at bedside, pt was seen by PCP and was told to come in due to dehydration. Per Son, pt has not had any N/V/D. States she has had soft stools. Pt states she has been able to eat but small amounts. Pt is also c/o SHOB. Was 82% in lobby. When brought back to room, she is 95% on RA with RR of 26. Pt states she has been feeling SHOB x1 week. Pt has cardiac hx of a.fib, triple bypass surgery. Pt is A&Ox3.

## 2021-02-18 NOTE — Assessment & Plan Note (Signed)
Patient currently has rapid atrial fibrillation with a heart rate of 105-110 bpm.  2.5 mg of IV Lopressor ordered.  We will update her echo as it has been nearly 3 years since her last one.

## 2021-02-18 NOTE — Assessment & Plan Note (Signed)
Stable

## 2021-02-18 NOTE — ED Notes (Signed)
Pt provided with warm blanket. 

## 2021-02-18 NOTE — Assessment & Plan Note (Signed)
Given hyperglycemia noted on several chemistries, will check A1c.

## 2021-02-18 NOTE — ED Notes (Signed)
Dr. Bridgett Larsson paged for critical results, INR and lactic acid

## 2021-02-18 NOTE — ED Notes (Signed)
Critical result called; lactic acid 2.3

## 2021-02-18 NOTE — ED Notes (Signed)
Pt used bedside commode with one person assist.

## 2021-02-18 NOTE — Assessment & Plan Note (Signed)
Continue Neurontin. 

## 2021-02-19 DIAGNOSIS — I5023 Acute on chronic systolic (congestive) heart failure: Secondary | ICD-10-CM | POA: Diagnosis present

## 2021-02-19 DIAGNOSIS — E039 Hypothyroidism, unspecified: Secondary | ICD-10-CM | POA: Diagnosis present

## 2021-02-19 DIAGNOSIS — K5790 Diverticulosis of intestine, part unspecified, without perforation or abscess without bleeding: Secondary | ICD-10-CM | POA: Diagnosis not present

## 2021-02-19 DIAGNOSIS — J189 Pneumonia, unspecified organism: Secondary | ICD-10-CM | POA: Diagnosis present

## 2021-02-19 DIAGNOSIS — Z7901 Long term (current) use of anticoagulants: Secondary | ICD-10-CM | POA: Diagnosis not present

## 2021-02-19 DIAGNOSIS — E785 Hyperlipidemia, unspecified: Secondary | ICD-10-CM | POA: Diagnosis present

## 2021-02-19 DIAGNOSIS — R52 Pain, unspecified: Secondary | ICD-10-CM | POA: Diagnosis not present

## 2021-02-19 DIAGNOSIS — Z79890 Hormone replacement therapy (postmenopausal): Secondary | ICD-10-CM | POA: Diagnosis not present

## 2021-02-19 DIAGNOSIS — M5459 Other low back pain: Secondary | ICD-10-CM | POA: Diagnosis not present

## 2021-02-19 DIAGNOSIS — Z79899 Other long term (current) drug therapy: Secondary | ICD-10-CM | POA: Diagnosis not present

## 2021-02-19 DIAGNOSIS — I2510 Atherosclerotic heart disease of native coronary artery without angina pectoris: Secondary | ICD-10-CM | POA: Diagnosis not present

## 2021-02-19 DIAGNOSIS — M109 Gout, unspecified: Secondary | ICD-10-CM | POA: Diagnosis not present

## 2021-02-19 DIAGNOSIS — Z888 Allergy status to other drugs, medicaments and biological substances status: Secondary | ICD-10-CM | POA: Diagnosis not present

## 2021-02-19 DIAGNOSIS — R079 Chest pain, unspecified: Secondary | ICD-10-CM | POA: Diagnosis not present

## 2021-02-19 DIAGNOSIS — E1165 Type 2 diabetes mellitus with hyperglycemia: Secondary | ICD-10-CM | POA: Diagnosis present

## 2021-02-19 DIAGNOSIS — R0600 Dyspnea, unspecified: Secondary | ICD-10-CM | POA: Diagnosis not present

## 2021-02-19 DIAGNOSIS — N12 Tubulo-interstitial nephritis, not specified as acute or chronic: Secondary | ICD-10-CM | POA: Diagnosis not present

## 2021-02-19 DIAGNOSIS — I341 Nonrheumatic mitral (valve) prolapse: Secondary | ICD-10-CM | POA: Diagnosis not present

## 2021-02-19 DIAGNOSIS — R791 Abnormal coagulation profile: Secondary | ICD-10-CM | POA: Diagnosis present

## 2021-02-19 DIAGNOSIS — I480 Paroxysmal atrial fibrillation: Secondary | ICD-10-CM | POA: Diagnosis present

## 2021-02-19 DIAGNOSIS — I517 Cardiomegaly: Secondary | ICD-10-CM | POA: Diagnosis not present

## 2021-02-19 DIAGNOSIS — I5042 Chronic combined systolic (congestive) and diastolic (congestive) heart failure: Secondary | ICD-10-CM | POA: Diagnosis not present

## 2021-02-19 DIAGNOSIS — Z8249 Family history of ischemic heart disease and other diseases of the circulatory system: Secondary | ICD-10-CM | POA: Diagnosis not present

## 2021-02-19 DIAGNOSIS — R0902 Hypoxemia: Secondary | ICD-10-CM | POA: Diagnosis not present

## 2021-02-19 DIAGNOSIS — E1142 Type 2 diabetes mellitus with diabetic polyneuropathy: Secondary | ICD-10-CM | POA: Diagnosis present

## 2021-02-19 DIAGNOSIS — G8929 Other chronic pain: Secondary | ICD-10-CM | POA: Diagnosis not present

## 2021-02-19 DIAGNOSIS — I110 Hypertensive heart disease with heart failure: Secondary | ICD-10-CM | POA: Diagnosis present

## 2021-02-19 DIAGNOSIS — Z20822 Contact with and (suspected) exposure to covid-19: Secondary | ICD-10-CM | POA: Diagnosis present

## 2021-02-19 DIAGNOSIS — J302 Other seasonal allergic rhinitis: Secondary | ICD-10-CM | POA: Diagnosis not present

## 2021-02-19 DIAGNOSIS — G629 Polyneuropathy, unspecified: Secondary | ICD-10-CM | POA: Diagnosis not present

## 2021-02-19 DIAGNOSIS — R739 Hyperglycemia, unspecified: Secondary | ICD-10-CM | POA: Diagnosis not present

## 2021-02-19 DIAGNOSIS — Z951 Presence of aortocoronary bypass graft: Secondary | ICD-10-CM | POA: Diagnosis not present

## 2021-02-19 DIAGNOSIS — I10 Essential (primary) hypertension: Secondary | ICD-10-CM | POA: Diagnosis not present

## 2021-02-19 LAB — GLUCOSE, CAPILLARY
Glucose-Capillary: 159 mg/dL — ABNORMAL HIGH (ref 70–99)
Glucose-Capillary: 188 mg/dL — ABNORMAL HIGH (ref 70–99)

## 2021-02-19 LAB — CBC WITH DIFFERENTIAL/PLATELET
Abs Immature Granulocytes: 0.08 10*3/uL — ABNORMAL HIGH (ref 0.00–0.07)
Basophils Absolute: 0 10*3/uL (ref 0.0–0.1)
Basophils Relative: 0 %
Eosinophils Absolute: 0 10*3/uL (ref 0.0–0.5)
Eosinophils Relative: 0 %
HCT: 37.3 % (ref 36.0–46.0)
Hemoglobin: 12.5 g/dL (ref 12.0–15.0)
Immature Granulocytes: 1 %
Lymphocytes Relative: 9 %
Lymphs Abs: 0.9 10*3/uL (ref 0.7–4.0)
MCH: 33.4 pg (ref 26.0–34.0)
MCHC: 33.5 g/dL (ref 30.0–36.0)
MCV: 99.7 fL (ref 80.0–100.0)
Monocytes Absolute: 1.1 10*3/uL — ABNORMAL HIGH (ref 0.1–1.0)
Monocytes Relative: 11 %
Neutro Abs: 8.6 10*3/uL — ABNORMAL HIGH (ref 1.7–7.7)
Neutrophils Relative %: 79 %
Platelets: 183 10*3/uL (ref 150–400)
RBC: 3.74 MIL/uL — ABNORMAL LOW (ref 3.87–5.11)
RDW: 15 % (ref 11.5–15.5)
WBC: 10.7 10*3/uL — ABNORMAL HIGH (ref 4.0–10.5)
nRBC: 0 % (ref 0.0–0.2)

## 2021-02-19 LAB — PROTIME-INR
INR: 3.1 — ABNORMAL HIGH (ref 0.8–1.2)
INR: 3.4 — ABNORMAL HIGH (ref 0.8–1.2)
Prothrombin Time: 32 seconds — ABNORMAL HIGH (ref 11.4–15.2)
Prothrombin Time: 34.4 seconds — ABNORMAL HIGH (ref 11.4–15.2)

## 2021-02-19 LAB — COMPREHENSIVE METABOLIC PANEL
ALT: 30 U/L (ref 0–44)
AST: 33 U/L (ref 15–41)
Albumin: 3.2 g/dL — ABNORMAL LOW (ref 3.5–5.0)
Alkaline Phosphatase: 142 U/L — ABNORMAL HIGH (ref 38–126)
Anion gap: 12 (ref 5–15)
BUN: 23 mg/dL (ref 8–23)
CO2: 20 mmol/L — ABNORMAL LOW (ref 22–32)
Calcium: 8.9 mg/dL (ref 8.9–10.3)
Chloride: 105 mmol/L (ref 98–111)
Creatinine, Ser: 0.82 mg/dL (ref 0.44–1.00)
GFR, Estimated: >60 mL/min (ref >60)
Glucose, Bld: 177 mg/dL — ABNORMAL HIGH (ref 70–99)
Potassium: 3.7 mmol/L (ref 3.5–5.1)
Sodium: 137 mmol/L (ref 135–145)
Total Bilirubin: 3 mg/dL — ABNORMAL HIGH (ref 0.3–1.2)
Total Protein: 6.9 g/dL (ref 6.5–8.1)

## 2021-02-19 LAB — TSH: TSH: 1.84 u[IU]/mL (ref 0.350–4.500)

## 2021-02-19 LAB — HIV ANTIBODY (ROUTINE TESTING W REFLEX): HIV Screen 4th Generation wRfx: NONREACTIVE

## 2021-02-19 LAB — LACTIC ACID, PLASMA
Lactic Acid, Venous: 2.9 mmol/L (ref 0.5–1.9)
Lactic Acid, Venous: 2 mmol/L (ref 0.5–1.9)

## 2021-02-19 LAB — MAGNESIUM: Magnesium: 2 mg/dL (ref 1.7–2.4)

## 2021-02-19 MED ORDER — ALBUTEROL SULFATE (2.5 MG/3ML) 0.083% IN NEBU
2.5000 mg | INHALATION_SOLUTION | RESPIRATORY_TRACT | Status: DC | PRN
Start: 2021-02-19 — End: 2021-02-27
  Administered 2021-02-19: 2.5 mg via RESPIRATORY_TRACT
  Filled 2021-02-19: qty 3

## 2021-02-19 MED ORDER — SODIUM CHLORIDE 0.9 % IV SOLN
2.0000 g | INTRAVENOUS | Status: AC
  Administered 2021-02-19 – 2021-02-23 (×5): 2 g via INTRAVENOUS
  Filled 2021-02-19: qty 2
  Filled 2021-02-19: qty 20
  Filled 2021-02-19 (×3): qty 2

## 2021-02-19 MED ORDER — METOPROLOL SUCCINATE ER 50 MG PO TB24
50.0000 mg | ORAL_TABLET | Freq: Two times a day (BID) | ORAL | Status: DC
  Administered 2021-02-19 – 2021-02-27 (×17): 50 mg via ORAL
  Filled 2021-02-19 (×17): qty 1

## 2021-02-19 MED ORDER — FLORANEX PO PACK
1.0000 g | PACK | Freq: Three times a day (TID) | ORAL | Status: DC
  Administered 2021-02-19 – 2021-02-25 (×16): 1 g via ORAL
  Filled 2021-02-19 (×21): qty 1

## 2021-02-19 MED ORDER — SODIUM CHLORIDE 0.9% FLUSH: 3.0000 mL | INTRAVENOUS | Status: DC | PRN

## 2021-02-19 MED ORDER — GABAPENTIN 400 MG PO CAPS
400.0000 mg | ORAL_CAPSULE | Freq: Every day | ORAL | Status: DC
  Administered 2021-02-19 – 2021-02-27 (×9): 400 mg via ORAL
  Filled 2021-02-19 (×9): qty 1

## 2021-02-19 MED ORDER — GUAIFENESIN ER 600 MG PO TB12
600.0000 mg | ORAL_TABLET | Freq: Two times a day (BID) | ORAL | Status: DC
  Administered 2021-02-19 – 2021-02-27 (×18): 600 mg via ORAL
  Filled 2021-02-19 (×18): qty 1

## 2021-02-19 MED ORDER — ACETAMINOPHEN 650 MG RE SUPP: 650.0000 mg | Freq: Four times a day (QID) | RECTAL | Status: DC | PRN

## 2021-02-19 MED ORDER — SODIUM CHLORIDE 0.9 % IV SOLN
250.0000 mL | INTRAVENOUS | Status: DC | PRN
  Administered 2021-02-21 – 2021-02-23 (×2): 250 mL via INTRAVENOUS

## 2021-02-19 MED ORDER — ACETAMINOPHEN 325 MG PO TABS
650.0000 mg | ORAL_TABLET | Freq: Four times a day (QID) | ORAL | Status: DC | PRN
  Administered 2021-02-24: 650 mg via ORAL
  Filled 2021-02-19: qty 2

## 2021-02-19 MED ORDER — LEVOTHYROXINE SODIUM 25 MCG PO TABS
25.0000 ug | ORAL_TABLET | Freq: Every day | ORAL | Status: DC
  Administered 2021-02-19 – 2021-02-27 (×9): 25 ug via ORAL
  Filled 2021-02-19 (×9): qty 1

## 2021-02-19 MED ORDER — AZITHROMYCIN 250 MG PO TABS
500.0000 mg | ORAL_TABLET | Freq: Every day | ORAL | Status: AC
  Administered 2021-02-19 – 2021-02-23 (×5): 500 mg via ORAL
  Filled 2021-02-19 (×5): qty 2

## 2021-02-19 MED ORDER — POTASSIUM CHLORIDE CRYS ER 20 MEQ PO TBCR
20.0000 meq | EXTENDED_RELEASE_TABLET | Freq: Two times a day (BID) | ORAL | Status: DC
  Administered 2021-02-19: 20 meq via ORAL
  Filled 2021-02-19: qty 1

## 2021-02-19 MED ORDER — SODIUM CHLORIDE 0.9 % IV SOLN: INTRAVENOUS | Status: DC

## 2021-02-19 MED ORDER — GABAPENTIN 400 MG PO CAPS
800.0000 mg | ORAL_CAPSULE | Freq: Every day | ORAL | Status: DC
  Administered 2021-02-19 – 2021-02-26 (×9): 800 mg via ORAL
  Filled 2021-02-19 (×9): qty 2

## 2021-02-19 MED ORDER — SACUBITRIL-VALSARTAN 24-26 MG PO TABS
1.0000 | ORAL_TABLET | Freq: Two times a day (BID) | ORAL | Status: DC
  Administered 2021-02-19 – 2021-02-27 (×17): 1 via ORAL
  Filled 2021-02-19 (×17): qty 1

## 2021-02-19 MED ORDER — BACID PO TABS: 2.0000 | ORAL_TABLET | Freq: Three times a day (TID) | ORAL | Status: DC

## 2021-02-19 MED ORDER — SODIUM CHLORIDE 0.9% FLUSH
3.0000 mL | Freq: Two times a day (BID) | INTRAVENOUS | Status: DC
  Administered 2021-02-19 – 2021-02-26 (×14): 3 mL via INTRAVENOUS

## 2021-02-19 MED ORDER — FUROSEMIDE 40 MG PO TABS
40.0000 mg | ORAL_TABLET | Freq: Two times a day (BID) | ORAL | Status: DC
  Administered 2021-02-19: 40 mg via ORAL
  Filled 2021-02-19: qty 1

## 2021-02-19 NOTE — Progress Notes (Signed)
Nutrition Brief Note  Patient identified on the Malnutrition Screening Tool (MST) Report  Wt Readings from Last 15 Encounters:  02/19/21 67.6 kg  01/18/21 68 kg  07/31/20 66.4 kg  01/18/20 66.2 kg  05/14/19 65.3 kg  05/02/19 69.9 kg  03/04/19 66.5 kg  10/28/18 64.4 kg  10/22/18 67.5 kg  09/13/18 65.5 kg  06/29/18 65.1 kg  06/15/18 65 kg  04/07/18 65.1 kg  08/28/17 63.4 kg  07/29/17 65.3 kg    Body mass index is 23.34 kg/m. Patient meets criteria for normal weight based on current BMI. Weight today is 149 lb and weight has been stable over the past 2 years. Skin WDL.   Current diet order is Heart Healthy and she ate 100% of breakfast this AM (353 kcal and 21 grams protein). Labs and medications reviewed.   Admitted due to RUL PNA and afib. She met criteria for sepsis on admission.   No nutrition interventions warranted at this time. If nutrition issues arise, please consult RD.      Jarome Matin, MS, RD, LDN, CNSC Inpatient Clinical Dietitian RD pager # available in [place]  After hours/weekend pager # available in [facility]

## 2021-02-19 NOTE — Progress Notes (Signed)
[facility]  PROGRESS NOTE  Kevyn Wengert ZDG:[id number] DOB: 08-Oct-1934 DOA: 02/18/2021 PCP: Cyndi Bender, PA-C   Brief HPI:   87 year old female with history of atrial fibrillation, on Coumadin, hypertension, chronic systolic heart failure last known EF in 2019 of 35%, neuropathy presented to ED with complaints of dyspnea for past 3 days.  Patient also has been coughing up phlegm.  In the ED chest x-ray showed possible right upper lobe infiltrate.  Patient was diagnosed with pneumonia and started on IV antibiotics including Rocephin and Zithromax.    Subjective   Patient seen and examined, complains of cough this morning.  Denies shortness of breath.   Assessment/Plan:     1. Community-acquired pneumonia-CT chest without contrast shows right upper and lower lobe peribronchovascular groundglass and consolidative airspace opacities suggestive of infection/inflammation.  Patient started on Rocephin and Zithromax for 5 days.  Follow-up chest x-ray in 3 to 4 weeks recommended to ensure complete resolution of findings.  Blood cultures are negative to date.  Follow blood culture results. 2. Paroxysmal atrial fibrillation-heart rate is well controlled, continue metoprolol 50 mg p.o. twice daily, continue monitoring on telemetry.  Continue warfarin for anticoagulation.  Pharmacy to manage dosing of warfarin. 3. Chronic systolic/diastolic heart failure-last echocardiogram from 2019 showed EF of 35 to 40%.  Does not appear to be in fluid overload at this time.  Currently euvolemic.  Considering patient has pneumonia and was started on IV fluids will hold Lasix for now.  Consider restarting Lasix in 1 to 2 days.  Continue Entresto 4. Hypertension-blood pressure stable, continue metoprolol, sacubitril/valsartan  5. Hypothyroidism-continue Synthroid 6. Peripheral neuropathy-continue Neurontin 7. Hyperglycemia-hemoglobin A1c has been ordered.  Check CBG twice daily.   Scheduled  medications:   . azithromycin  500 mg Oral QHS  . gabapentin  400 mg Oral Q breakfast  . gabapentin  800 mg Oral QHS  . guaiFENesin  600 mg Oral BID  . lactobacillus  1 g Oral TID WC  . levothyroxine  25 mcg Oral Q0600  . metoprolol succinate  50 mg Oral BID  . potassium chloride SA  20 mEq Oral BID  . sacubitril-valsartan  1 tablet Oral BID  . sodium chloride flush  3 mL Intravenous Q12H  . Warfarin - Pharmacist Dosing Inpatient   Does not apply q1600         Data Reviewed:   CBG:  Recent Labs  Lab 02/19/21 0748  GLUCAP 159*    SpO2: 92 %    Vitals:   02/19/21 0546 02/19/21 0549 02/19/21 0747 02/19/21 1344  BP:  140/87 (!) 147/118   Pulse:  86 99   Resp:  20 20   Temp:  97.6 F (36.4 C) 97.6 F (36.4 C)   TempSrc:  Oral Oral   SpO2:  99% 91% 92%  Weight: 67.6 kg     Height: 5\' 7"  (1.702 m)        Intake/Output Summary (Last 24 hours) at 02/19/2021 1527 Last data filed at 02/19/2021 1021 Gross per 24 hour  Intake 885.78 ml  Output --  Net 885.78 ml    04/17 1901 - 04/19 0700 In: 405.8 [I.V.:55.8] Out: -   Filed Weights   02/18/21 1806 02/18/21 2334 02/19/21 0546  Weight: 67.1 kg 67.1 kg 67.6 kg    CBC:  Recent Labs  Lab 02/18/21 1833 02/19/21 0522  WBC 13.7* 10.7*  HGB 12.6 12.5  HCT 38.3 37.3  PLT 221 183  MCV 100.3* 99.7  MCH 33.0 33.4  MCHC 32.9 33.5  RDW 15.3 15.0  LYMPHSABS 0.9 0.9  MONOABS 1.3* 1.1*  EOSABS 0.0 0.0  BASOSABS 0.0 0.0    Complete metabolic panel:  Recent Labs  Lab 02/18/21 1833 02/18/21 2135 02/19/21 0053 02/19/21 0522 02/19/21 0534  NA 137  --   --  137  --   K 4.0  --   --  3.7  --   CL 102  --   --  105  --   CO2 24  --   --  20*  --   GLUCOSE 189*  --   --  177*  --   BUN 25*  --   --  23  --   CREATININE 0.89  --   --  0.82  --   CALCIUM 9.6  --   --  8.9  --   AST 36  --   --  33  --   ALT 31  --   --  30  --   ALKPHOS 156*  --   --  142*  --   BILITOT 3.7*  --   --  3.0*  --   ALBUMIN  3.7  --   --  3.2*  --   MG  --  2.1  --  2.0  --   DDIMER 1.26*  --   --   --   --   LATICACIDVEN  --  2.3* 2.9*  --  2.0*  INR  --  5.5* 3.1* 3.4*  --   TSH  --   --   --  1.840  --   BNP 458.2*  --   --   --   --     No results for input(s): LIPASE, AMYLASE in the last 168 hours.  Recent Labs  Lab 02/18/21 1833 02/18/21 2115  DDIMER 1.26*  --   BNP 458.2*  --   SARSCOV2NAA  --  NEGATIVE    ------------------------------------------------------------------------------------------------------------------ No results for input(s): CHOL, HDL, LDLCALC, TRIG, CHOLHDL, LDLDIRECT in the last 72 hours.  Lab Results  Component Value Date   HGBA1C (H) 04/08/2007    6.9 (NOTE)   The ADA recommends the following therapeutic goals for glycemic   control related to Hgb A1C measurement:   Goal of Therapy:   < 7.0% Hgb A1C   Action Suggested:  > 8.0% Hgb A1C   Ref:  Diabetes Care, 22, Suppl. 1, 1999   ------------------------------------------------------------------------------------------------------------------ Recent Labs    02/19/21 0522  TSH 1.840   ------------------------------------------------------------------------------------------------------------------ No results for input(s): VITAMINB12, FOLATE, FERRITIN, TIBC, IRON, RETICCTPCT in the last 72 hours.  Coagulation profile  Recent Labs  Lab 02/18/21 2135 02/19/21 0053 02/19/21 0522  INR 5.5* 3.1* 3.4*    Recent Labs    02/18/21 1833  DDIMER 1.26*    Cardiac Enzymes  No results for input(s): CKMB, TROPONINI, MYOGLOBIN in the last 168 hours.  Invalid input(s): CK ------------------------------------------------------------------------------------------------------------------    Component Value Date/Time   BNP 458.2 (H) 02/18/2021 1833     Antibiotics: Anti-infectives (From admission, onward)   Start     Dose/Rate Route Frequency Ordered Stop   02/19/21 2200  cefTRIAXone (ROCEPHIN) 2 g in sodium  chloride 0.9 % 100 mL IVPB        2 g 200 mL/hr over 30 Minutes Intravenous Every 24 hours 02/19/21 0052 02/24/21 2159   02/19/21 2200  azithromycin (ZITHROMAX) tablet 500 mg  500 mg Oral Daily at bedtime 02/19/21 0052 02/24/21 2159   02/18/21 2115  cefTRIAXone (ROCEPHIN) 2 g in sodium chloride 0.9 % 100 mL IVPB        2 g 200 mL/hr over 30 Minutes Intravenous  Once 02/18/21 2102 02/18/21 2300   02/18/21 2115  azithromycin (ZITHROMAX) 500 mg in sodium chloride 0.9 % 250 mL IVPB        500 mg 250 mL/hr over 60 Minutes Intravenous  Once 02/18/21 2102 02/19/21 0130       Radiology Reports  CT CHEST WO CONTRAST  Result Date: 02/18/2021 CLINICAL DATA:  Chest pain or SOB, pleurisy or effusion suspected EXAM: CT CHEST WITHOUT CONTRAST TECHNIQUE: Multidetector CT imaging of the chest was performed following the standard protocol without IV contrast. COMPARISON:  Chest x-ray 12/24/2011 FINDINGS: Cardiovascular: Enlarged right atrium. No significant pericardial effusion. The thoracic aorta is normal in caliber. Moderate atherosclerotic plaque of the thoracic aorta. Four-vessel coronary artery calcifications. Enlarged main pulmonary artery. Mediastinum/Nodes: Question right hilar lymph lymphadenopathy in a, noting limited sensitivity for the detection of hilar adenopathy on this noncontrast study. Enlarged 1.3 cm precarinal lymph node. No axillary lymph nodes. Thyroid gland, trachea, and esophagus demonstrate no significant findings. Question tiny hiatal hernia. Lungs/Pleura: Expiratory phase of respiration. Right upper and lower lobes peribronchovascular ground-glass and consolidative airspace opacities. Associated interlobular septal wall thickening. Lingular linear atelectasis. Subsegmental atelectasis within left lower lobe. No pulmonary nodule noted within the aerated lungs. No pulmonary mass. Trace right pleural effusion. No definite left pleural effusion. No pneumothorax. Upper Abdomen: No acute  abnormality. Musculoskeletal: No abdominal wall hernia or abnormality. No suspicious lytic or blastic osseous lesions. No acute displaced fracture. Multilevel degenerative changes of the spine. Status post sternotomy wires with some of the sternotomy wires that appear fracture. IMPRESSION: 1. Right upper and lower lobe pulmonary findings suggestive of infection/inflammation. Followup PA and lateral chest X-ray is recommended in 3-4 weeks following therapy to ensure resolution and exclude underlying malignancy. 2. Tracer right pleural effusion. 3. A 1.3 cm enlarged precarinal lymph node and question of right hilar lymphadenopathy with limited evaluation on this noncontrast study. Finding likely reactive in etiology. Attention on follow-up. 4. Enlarged main pulmonary artery consistent with pulmonary hypertension. 5. Enlarged right atrium. 6. Question tiny hiatal hernia. 7.  Aortic Atherosclerosis (ICD10-I70.0). Electronically Signed   By: Iven Finn M.D.   On: 02/18/2021 22:55   DG Chest Port 1 View  Result Date: 02/18/2021 CLINICAL DATA:  Weakness since Friday, dehydration, shortness of breath EXAM: PORTABLE CHEST 1 VIEW COMPARISON:  Portable exam 1835 hours compared to 09/21/2015 FINDINGS: Enlargement of cardiac silhouette post CABG. Mediastinal contours and pulmonary vascularity normal. RIGHT upper lobe infiltrate consistent with pneumonia. Minimal atelectasis or infiltrate at LEFT base. No pleural effusion or pneumothorax. Bones demineralized. IMPRESSION: RIGHT upper lobe infiltrate consistent with pneumonia. Mild atelectasis versus infiltrate LEFT lower lobe. Enlargement of cardiac silhouette post CABG. Electronically Signed   By: Lavonia Dana M.D.   On: 02/18/2021 19:01      DVT prophylaxis: Patient on warfarin  Code Status: Full code  Family Communication: Discussed with patient's son at bedside   Consultants:    Procedures:      Objective    Physical  Examination:    General-appears in no acute distress  Heart-S1-S2, regular, no murmur auscultated  Lungs-clear to auscultation bilaterally, no wheezing or crackles auscultated  Abdomen-soft, nontender, no organomegaly  Extremities-no edema in the lower extremities  Neuro-alert, oriented x3,  no focal deficit noted   Status is: Inpatient  Dispo: The patient is from: Home              Anticipated d/c is to: Home              Anticipated d/c date is: 02/22/2021              Patient currently not stable for discharge  Barrier to discharge-ongoing treatment for pneumonia  COVID-19 Labs  Recent Labs    02/18/21 1833  DDIMER 1.26*    Lab Results  Component Value Date   [place] NEGATIVE 02/18/2021    Microbiology  Recent Results (from the past 240 hour(s))  Resp Panel by RT-PCR (Flu A&B, Covid) Nasopharyngeal Swab     Status: None   Collection Time: 02/18/21  9:15 PM   Specimen: Nasopharyngeal Swab; Nasopharyngeal(NP) swabs in vial transport medium  Result Value Ref Range Status   SARS Coronavirus 2 by RT PCR NEGATIVE NEGATIVE Final    Comment: (NOTE) SARS-CoV-2 target nucleic acids are NOT DETECTED.  The SARS-CoV-2 RNA is generally detectable in upper respiratory specimens during the acute phase of infection. The lowest concentration of SARS-CoV-2 viral copies this assay can detect is 138 copies/mL. A negative result does not preclude SARS-Cov-2 infection and should not be used as the sole basis for treatment or other patient management decisions. A negative result may occur with  improper specimen collection/handling, submission of specimen other than nasopharyngeal swab, presence of viral mutation(s) within the areas targeted by this assay, and inadequate number of viral copies(<138 copies/mL). A negative result must be combined with clinical observations, patient history, and epidemiological information. The expected result is Negative.  Fact Sheet for  Patients:  EntrepreneurPulse.com.au  Fact Sheet for Healthcare Providers:  IncredibleEmployment.be  This test is no t yet approved or cleared by the [place] FDA and  has been authorized for detection and/or diagnosis of SARS-CoV-2 by FDA under an Emergency Use Authorization (EUA). This EUA will remain  in effect (meaning this test can be used) for the duration of the COVID-19 declaration under Section 564(b)(1) of the Act, 21 U.S.C.section 360bbb-3(b)(1), unless the authorization is terminated  or revoked sooner.       Influenza A by PCR NEGATIVE NEGATIVE Final   Influenza B by PCR NEGATIVE NEGATIVE Final    Comment: (NOTE) The Xpert Xpress SARS-CoV-2/FLU/RSV plus assay is intended as an aid in the diagnosis of influenza from Nasopharyngeal swab specimens and should not be used as a sole basis for treatment. Nasal washings and aspirates are unacceptable for Xpert Xpress SARS-CoV-2/FLU/RSV testing.  Fact Sheet for Patients: EntrepreneurPulse.com.au  Fact Sheet for Healthcare Providers: IncredibleEmployment.be  This test is not yet approved or cleared by the [place] FDA and has been authorized for detection and/or diagnosis of SARS-CoV-2 by FDA under an Emergency Use Authorization (EUA). This EUA will remain in effect (meaning this test can be used) for the duration of the COVID-19 declaration under Section 564(b)(1) of the Act, 21 U.S.C. section 360bbb-3(b)(1), unless the authorization is terminated or revoked.  Performed at [facility], [place] [address], [place], [place] [address]              Oswald Hillock   [facility] If 7PM-7AM, please contact night-coverage at [email or web address], Office  [phone]   02/19/2021, 3:27 PM  LOS: 0 days

## 2021-02-19 NOTE — Progress Notes (Signed)
[place] for Coumadin Indication: atrial fibrillation  Allergies  Allergen Reactions  . Amiodarone Other (See Comments)    unknown  . Crestor [Rosuvastatin Calcium] Other (See Comments)    unknown  . Lipitor [Atorvastatin Calcium] Swelling    Patient Measurements: Height: 5\' 7"  (170.2 cm) Weight: 67.6 kg (149 lb) IBW/kg (Calculated) : 61.6  Vital Signs: Temp: 97.6 F (36.4 C) (04/19 0747) Temp Source: Oral (04/19 0747) BP: 147/118 (04/19 0747) Pulse Rate: 99 (04/19 0747)  Labs: Recent Labs    02/18/21 1833 02/18/21 2051 02/18/21 2135 02/19/21 0053 02/19/21 0522  HGB 12.6  --   --   --  12.5  HCT 38.3  --   --   --  37.3  PLT 221  --   --   --  183  LABPROT  --   --  49.9* 32.0* 34.4*  INR  --   --  5.5* 3.1* 3.4*  CREATININE 0.89  --   --   --  0.82  TROPONINIHS 13 11  --   --   --     Estimated Creatinine Clearance: 48.8 mL/min (by C-G formula based on SCr of 0.82 mg/dL).   Medical History: Past Medical History:  Diagnosis Date  . Atrial fibrillation ([place])   . Back pain   . Bruises easily   . Chest discomfort   . Diverticulitis   . Dizziness   . DOE (dyspnea on exertion)   . Epistaxis 06/16/2019  . Forgetfulness   . Hx of cardiovascular stress test    Lexiscan Myoview (12/15):  Apical lateral and anterolateral fixed defect, no ischemia, EF 54%; low risk  . Hyperlipidemia   . Hypertension   . Hypothyroidism   . Interstitial nephritis   . Ischemic heart disease   . MVP (mitral valve prolapse)   . Neuropathy     Medications:  Warfarin PTA- 5mg  daily except 2.5mg  on Tues/Thur/Sat.  LD 4/18 @ 10a  Assessment: [name] with hx of afib on Coumadin.  INR was SUPRAtherapeutic on admission at 5.5.  Elevated INR likely due to acute illness & poor oral intake for past several days. Rocephin + Zithromax given in ED which can also interact to elevate INR.    Of note, patient has been on current outpatient regimen  since Dec 2021 with mostly therapeutic INR.   CBC WNL, no bleeding noted.   02/19/21 9:57 AM  - INR 3.4 - CBC stable - no bleeding reported  Goal of Therapy:  INR 2-3   Plan:  Continue to hold Coumadin for elevated INR Daily PT/INR Monitor closely for s/sx of bleeding  Napoleon Form  02/19/2021,9:56 AM

## 2021-02-19 NOTE — ED Notes (Signed)
Pt son, Gerald Stabs, took home pt shoes and clothes. Pt is going upstairs with no belongings

## 2021-02-19 NOTE — Plan of Care (Signed)
Lab Results  Component Value Date/Time   LATICACIDVEN 2.9 ([facility]) 02/19/2021 12:53 AM   LATICACIDVEN 2.3 ([place]) 02/18/2021 09:35 PM   Lactic acid elevated. Combined with leukocytosis, tachycardia and RUL pneumonia on CXR, pt fulfills criteria for sepsis with acute organ dysfunction without septic shock.  Will start NS @ 100 ml/hr.

## 2021-02-19 NOTE — Progress Notes (Addendum)
Date and time results received: 02/19/21 0230   Test: Lactic Acid Lab Critical Value: 2.9  Name of Provider Notified: Dr. Bridgett Larsson  Orders Received? Or Actions Taken?: Orders Received - See Orders for details

## 2021-02-20 LAB — CBC
HCT: 37.5 % (ref 36.0–46.0)
Hemoglobin: 12.8 g/dL (ref 12.0–15.0)
MCH: 33.3 pg (ref 26.0–34.0)
MCHC: 34.1 g/dL (ref 30.0–36.0)
MCV: 97.7 fL (ref 80.0–100.0)
Platelets: 210 10*3/uL (ref 150–400)
RBC: 3.84 MIL/uL — ABNORMAL LOW (ref 3.87–5.11)
RDW: 15.3 % (ref 11.5–15.5)
WBC: 11.1 10*3/uL — ABNORMAL HIGH (ref 4.0–10.5)
nRBC: 0.5 % — ABNORMAL HIGH (ref 0.0–0.2)

## 2021-02-20 LAB — BASIC METABOLIC PANEL
Anion gap: 11 (ref 5–15)
BUN: 22 mg/dL (ref 8–23)
CO2: 21 mmol/L — ABNORMAL LOW (ref 22–32)
Calcium: 9.1 mg/dL (ref 8.9–10.3)
Chloride: 104 mmol/L (ref 98–111)
Creatinine, Ser: 0.76 mg/dL (ref 0.44–1.00)
GFR, Estimated: >60 mL/min (ref >60)
Glucose, Bld: 159 mg/dL — ABNORMAL HIGH (ref 70–99)
Potassium: 3.7 mmol/L (ref 3.5–5.1)
Sodium: 136 mmol/L (ref 135–145)

## 2021-02-20 LAB — PROTIME-INR
INR: 3.7 — ABNORMAL HIGH (ref 0.8–1.2)
Prothrombin Time: 36.9 seconds — ABNORMAL HIGH (ref 11.4–15.2)

## 2021-02-20 LAB — GLUCOSE, CAPILLARY
Glucose-Capillary: 200 mg/dL — ABNORMAL HIGH (ref 70–99)
Glucose-Capillary: 192 mg/dL — ABNORMAL HIGH (ref 70–99)

## 2021-02-20 MED ORDER — FUROSEMIDE 10 MG/ML IJ SOLN
20.0000 mg | Freq: Once | INTRAMUSCULAR | Status: AC
  Administered 2021-02-20: 20 mg via INTRAVENOUS
  Filled 2021-02-20: qty 2

## 2021-02-20 MED ORDER — PHENOL 1.4 % MT LIQD
1.0000 | OROMUCOSAL | Status: DC | PRN
  Administered 2021-02-20: 1 via OROMUCOSAL
  Filled 2021-02-20: qty 177

## 2021-02-20 NOTE — Progress Notes (Addendum)
[place] for Coumadin Indication: atrial fibrillation  Allergies  Allergen Reactions  . Amiodarone Other (See Comments)    unknown  . Crestor [Rosuvastatin Calcium] Other (See Comments)    unknown  . Lipitor [Atorvastatin Calcium] Swelling    Patient Measurements: Height: 5\' 7"  (170.2 cm) Weight: 67.7 kg (149 lb 4 oz) IBW/kg (Calculated) : 61.6  Vital Signs: Temp: 97.8 F (36.6 C) (04/20 0526) BP: 151/98 (04/20 0526) Pulse Rate: 88 (04/20 0526)  Labs: Recent Labs    02/18/21 1833 02/18/21 2051 02/18/21 2135 02/19/21 0053 02/19/21 0522 02/20/21 0445  HGB 12.6  --   --   --  12.5 12.8  HCT 38.3  --   --   --  37.3 37.5  PLT 221  --   --   --  183 210  LABPROT  --   --    < > 32.0* 34.4* 36.9*  INR  --   --    < > 3.1* 3.4* 3.7*  CREATININE 0.89  --   --   --  0.82 0.76  TROPONINIHS 13 11  --   --   --   --    < > = values in this interval not displayed.    Estimated Creatinine Clearance: 50 mL/min (by C-G formula based on SCr of 0.76 mg/dL).   Medical History: Past Medical History:  Diagnosis Date  . Atrial fibrillation ([place])   . Back pain   . Bruises easily   . Chest discomfort   . Diverticulitis   . Dizziness   . DOE (dyspnea on exertion)   . Epistaxis 06/16/2019  . Forgetfulness   . Hx of cardiovascular stress test    Lexiscan Myoview (12/15):  Apical lateral and anterolateral fixed defect, no ischemia, EF 54%; low risk  . Hyperlipidemia   . Hypertension   . Hypothyroidism   . Interstitial nephritis   . Ischemic heart disease   . MVP (mitral valve prolapse)   . Neuropathy     Medications:  Warfarin PTA- 5mg  daily except 2.5mg  on Tues/Thur/Sat.  LD 4/18 @ 10a  Assessment: [name] with hx of afib on Coumadin.  INR was SUPRAtherapeutic on admission at 5.5.  Elevated INR likely due to acute illness & poor oral intake for past several days. Rocephin + Zithromax given in ED which can also interact to elevate  INR.    Of note, patient has been on current outpatient regimen since Dec 2021 with mostly therapeutic INR.   CBC WNL, no bleeding noted.   02/20/21 7:55 AM  - INR back up to 3.7 - CBC stable - Patient is on azithromycin which may potentiate INR - no bleeding reported  Goal of Therapy:  INR 2-3   Plan:  Continue to hold Coumadin for elevated INR Daily PT/INR Monitor closely for s/sx of bleeding  [name]  02/20/2021,7:55 AM

## 2021-02-20 NOTE — Progress Notes (Signed)
PROGRESS NOTE    Natasha Livingston  XHB:[id number] DOB: 1934-09-03 DOA: 02/18/2021 PCP: Cyndi Bender, PA-C    Brief Narrative: This 87 years old female with history of atrial fibrillation on Coumadin, hypertension, chronic systolic heart failure with last known EF in 2019 of 35%, Neuropathy presented in the ED with c/o: cough and shortness of breath for 3 days.  Patient reports coughing up yellow phlegm in the ED,  chest x-ray showed possible right upper lobe infiltrate.  Patient is diagnosed with pneumonia and started on IV antibiotics including Rocephin and Zithromax.  Assessment & Plan:   Principal Problem:   Right upper lobe pneumonia Active Problems:   Hypertension   Neuropathy   Long term (current) use of anticoagulants - on coumadin for afib   Paroxysmal atrial fibrillation (HCC)   Chronic combined systolic and diastolic CHF (congestive heart failure) ([facility]) - echo 02-2018 LVEF 35-40%   Hyperglycemia   CAP (community acquired pneumonia)   Community-acquired pneumonia; Patient presented with cough and shortness of breath.  CT chest without contrast shows right upper and lower lobe peribronchovascular groundglass and consolidative airspace opacities suggestive of infection/inflammation.  Patient started on Rocephin and Zithromax for 5 days.  Follow-up chest x-ray in 3 to 4 weeks,  recommended to ensure complete resolution of findings.  Blood cultures are negative to date.  Follow blood culture results.  Paroxysmal atrial fibrillation: Heart rate is well controlled, continue metoprolol 50 mg p.o. twice daily, continue monitoring on telemetry.   Continue warfarin for anticoagulation.  Pharmacy to manage dosing of warfarin.  Chronic systolic/diastolic heart failure: Last echocardiogram from 2019 showed EF of 35 to 40%.  Does not appear to be in fluid overload at this time.   Currently euvolemic.  Considering patient has pneumonia and was started on IV fluids,  will hold Lasix for  now.   Consider restarting Lasix in 1 to 2 days.  Continue Entresto  Hypertension: blood pressure stable, continue metoprolol, sacubitril/valsartan.  Hypothyroidism: continue Synthroid  Peripheral neuropathy: Continue Neurontin.  Hyperglycemia: hemoglobin A1c has been ordered.  Check CBG twice daily    DVT prophylaxis: Coumadin Code Status: Full code Family Communication: Spoke with son Disposition Plan:   Anti-infectives (From admission, onward)   Start     Dose/Rate Route Frequency Ordered Stop   02/19/21 2200  cefTRIAXone (ROCEPHIN) 2 g in sodium chloride 0.9 % 100 mL IVPB        2 g 200 mL/hr over 30 Minutes Intravenous Every 24 hours 02/19/21 0052 02/24/21 2159   02/19/21 2200  azithromycin (ZITHROMAX) tablet 500 mg        500 mg Oral Daily at bedtime 02/19/21 0052 02/24/21 2159   02/18/21 2115  cefTRIAXone (ROCEPHIN) 2 g in sodium chloride 0.9 % 100 mL IVPB        2 g 200 mL/hr over 30 Minutes Intravenous  Once 02/18/21 2102 02/18/21 2300   02/18/21 2115  azithromycin (ZITHROMAX) 500 mg in sodium chloride 0.9 % 250 mL IVPB        500 mg 250 mL/hr over 60 Minutes Intravenous  Once 02/18/21 2102 02/19/21 0130       Consultants:    None.  Procedures:  None. Antimicrobials:  Anti-infectives (From admission, onward)   Start     Dose/Rate Route Frequency Ordered Stop   02/19/21 2200  cefTRIAXone (ROCEPHIN) 2 g in sodium chloride 0.9 % 100 mL IVPB        2 g 200 mL/hr over 30  Minutes Intravenous Every 24 hours 02/19/21 0052 02/24/21 2159   02/19/21 2200  azithromycin (ZITHROMAX) tablet 500 mg        500 mg Oral Daily at bedtime 02/19/21 0052 02/24/21 2159   02/18/21 2115  cefTRIAXone (ROCEPHIN) 2 g in sodium chloride 0.9 % 100 mL IVPB        2 g 200 mL/hr over 30 Minutes Intravenous  Once 02/18/21 2102 02/18/21 2300   02/18/21 2115  azithromycin (ZITHROMAX) 500 mg in sodium chloride 0.9 % 250 mL IVPB        500 mg 250 mL/hr over 60 Minutes Intravenous  Once  02/18/21 2102 02/19/21 0130      Subjective: Patient was seen and examined at bedside.  Overnight events noted.   Patient reports feeling better, denies any shortness of breath, not requiring any oxygen.  Objective: Vitals:   02/20/21 0500 02/20/21 0526 02/20/21 0900 02/20/21 1523  BP:  (!) 151/98  126/86  Pulse:  88  99  Resp:  20  (!) 22  Temp:  97.8 F (36.6 C)  98 F (36.7 C)  TempSrc:    Axillary  SpO2:  94% 95% 98%  Weight: 67.7 kg     Height:        Intake/Output Summary (Last 24 hours) at 02/20/2021 1538 Last data filed at 02/20/2021 0933 Gross per 24 hour  Intake 440 ml  Output --  Net 440 ml   Filed Weights   02/18/21 2334 02/19/21 0546 02/20/21 0500  Weight: 67.1 kg 67.6 kg 67.7 kg    Examination:  General exam: Appears calm and comfortable, not in any acute distress. Respiratory system: Clear to auscultation. Respiratory effort normal. Cardiovascular system: S1 & S2 heard, RRR. No JVD, murmurs, rubs, gallops or clicks. No pedal edema. Gastrointestinal system: Abdomen is nondistended, soft and nontender. No organomegaly or masses felt. Normal bowel sounds heard. Central nervous system: Alert and oriented. No focal neurological deficits. Extremities: Symmetric 5 x 5 power.  No edema, no cyanosis, no clubbing. Skin: No rashes, lesions or ulcers Psychiatry: Judgement and insight appear normal. Mood & affect appropriate.     Data Reviewed: I have personally reviewed following labs and imaging studies  CBC: Recent Labs  Lab 02/18/21 1833 02/19/21 0522 02/20/21 0445  WBC 13.7* 10.7* 11.1*  NEUTROABS 11.3* 8.6*  --   HGB 12.6 12.5 12.8  HCT 38.3 37.3 37.5  MCV 100.3* 99.7 97.7  PLT 221 183 366   Basic Metabolic Panel: Recent Labs  Lab 02/18/21 1833 02/18/21 2135 02/19/21 0522 02/20/21 0445  NA 137  --  137 136  K 4.0  --  3.7 3.7  CL 102  --  105 104  CO2 24  --  20* 21*  GLUCOSE 189*  --  177* 159*  BUN 25*  --  23 22  CREATININE 0.89  --   0.82 0.76  CALCIUM 9.6  --  8.9 9.1  MG  --  2.1 2.0  --    GFR: Estimated Creatinine Clearance: 50 mL/min (by C-G formula based on SCr of 0.76 mg/dL). Liver Function Tests: Recent Labs  Lab 02/18/21 1833 02/19/21 0522  AST 36 33  ALT 31 30  ALKPHOS 156* 142*  BILITOT 3.7* 3.0*  PROT 8.2* 6.9  ALBUMIN 3.7 3.2*   No results for input(s): LIPASE, AMYLASE in the last 168 hours. No results for input(s): AMMONIA in the last 168 hours. Coagulation Profile: Recent Labs  Lab 02/18/21 2135 02/19/21 0053  02/19/21 0522 02/20/21 0445  INR 5.5* 3.1* 3.4* 3.7*   Cardiac Enzymes: No results for input(s): CKTOTAL, CKMB, CKMBINDEX, TROPONINI in the last 168 hours. BNP (last 3 results) No results for input(s): PROBNP in the last 8760 hours. HbA1C: No results for input(s): HGBA1C in the last 72 hours. CBG: Recent Labs  Lab 02/19/21 0748 02/19/21 2056 02/20/21 0820  GLUCAP 159* 188* 200*   Lipid Profile: No results for input(s): CHOL, HDL, LDLCALC, TRIG, CHOLHDL, LDLDIRECT in the last 72 hours. Thyroid Function Tests: Recent Labs    02/19/21 0522  TSH 1.840   Anemia Panel: No results for input(s): VITAMINB12, FOLATE, FERRITIN, TIBC, IRON, RETICCTPCT in the last 72 hours. Sepsis Labs: Recent Labs  Lab 02/18/21 2135 02/19/21 0053 02/19/21 0534  LATICACIDVEN 2.3* 2.9* 2.0*    Recent Results (from the past 240 hour(s))  Resp Panel by RT-PCR (Flu A&B, Covid) Nasopharyngeal Swab     Status: None   Collection Time: 02/18/21  9:15 PM   Specimen: Nasopharyngeal Swab; Nasopharyngeal(NP) swabs in vial transport medium  Result Value Ref Range Status   SARS Coronavirus 2 by RT PCR NEGATIVE NEGATIVE Final    Comment: (NOTE) SARS-CoV-2 target nucleic acids are NOT DETECTED.  The SARS-CoV-2 RNA is generally detectable in upper respiratory specimens during the acute phase of infection. The lowest concentration of SARS-CoV-2 viral copies this assay can detect is 138 copies/mL. A  negative result does not preclude SARS-Cov-2 infection and should not be used as the sole basis for treatment or other patient management decisions. A negative result may occur with  improper specimen collection/handling, submission of specimen other than nasopharyngeal swab, presence of viral mutation(s) within the areas targeted by this assay, and inadequate number of viral copies(<138 copies/mL). A negative result must be combined with clinical observations, patient history, and epidemiological information. The expected result is Negative.  Fact Sheet for Patients:  EntrepreneurPulse.com.au  Fact Sheet for Healthcare Providers:  IncredibleEmployment.be  This test is no t yet approved or cleared by the [place] FDA and  has been authorized for detection and/or diagnosis of SARS-CoV-2 by FDA under an Emergency Use Authorization (EUA). This EUA will remain  in effect (meaning this test can be used) for the duration of the COVID-19 declaration under Section 564(b)(1) of the Act, 21 U.S.C.section 360bbb-3(b)(1), unless the authorization is terminated  or revoked sooner.       Influenza A by PCR NEGATIVE NEGATIVE Final   Influenza B by PCR NEGATIVE NEGATIVE Final    Comment: (NOTE) The Xpert Xpress SARS-CoV-2/FLU/RSV plus assay is intended as an aid in the diagnosis of influenza from Nasopharyngeal swab specimens and should not be used as a sole basis for treatment. Nasal washings and aspirates are unacceptable for Xpert Xpress SARS-CoV-2/FLU/RSV testing.  Fact Sheet for Patients: EntrepreneurPulse.com.au  Fact Sheet for Healthcare Providers: IncredibleEmployment.be  This test is not yet approved or cleared by the [place] FDA and has been authorized for detection and/or diagnosis of SARS-CoV-2 by FDA under an Emergency Use Authorization (EUA). This EUA will remain in effect (meaning this test can  be used) for the duration of the COVID-19 declaration under Section 564(b)(1) of the Act, 21 U.S.C. section 360bbb-3(b)(1), unless the authorization is terminated or revoked.  Performed at [facility], [place] [address] [place], [place] [address]   Blood culture (routine x 2)     Status: None (Preliminary result)   Collection Time: 02/18/21  9:35 PM   Specimen: BLOOD RIGHT  HAND  Result Value Ref Range Status   Specimen Description   Final    BLOOD RIGHT HAND Performed at [facility] [address], [place], [place] [address]    Special Requests   Final    BOTTLES DRAWN AEROBIC AND ANAEROBIC Blood Culture results may not be optimal due to an inadequate volume of blood received in culture bottles Performed at [facility] [address] [place], [place] [address]    Culture   Final    NO GROWTH 1 DAY Performed at [facility], [place] [address] [place], [place] [address]    Report Status PENDING  Incomplete  Blood culture (routine x 2)     Status: None (Preliminary result)   Collection Time: 02/18/21  9:35 PM   Specimen: BLOOD  Result Value Ref Range Status   Specimen Description   Final    BLOOD RIGHT ANTECUBITAL Performed at [facility] [address], [place], [place] [address]    Special Requests   Final    BOTTLES DRAWN AEROBIC AND ANAEROBIC Blood Culture results may not be optimal due to an inadequate volume of blood received in culture bottles Performed at [place] [address] [place], [place] [address]    Culture   Final    NO GROWTH 1 DAY Performed at [facility], [place] [address] [place], [place] [address]    Report Status PENDING  Incomplete    Radiology Studies: CT CHEST WO CONTRAST  Result Date: 02/18/2021 CLINICAL DATA:  Chest pain or SOB, pleurisy or effusion suspected EXAM: CT CHEST WITHOUT CONTRAST TECHNIQUE: Multidetector CT imaging of  the chest was performed following the standard protocol without IV contrast. COMPARISON:  Chest x-ray 12/24/2011 FINDINGS: Cardiovascular: Enlarged right atrium. No significant pericardial effusion. The thoracic aorta is normal in caliber. Moderate atherosclerotic plaque of the thoracic aorta. Four-vessel coronary artery calcifications. Enlarged main pulmonary artery. Mediastinum/Nodes: Question right hilar lymph lymphadenopathy in a, noting limited sensitivity for the detection of hilar adenopathy on this noncontrast study. Enlarged 1.3 cm precarinal lymph node. No axillary lymph nodes. Thyroid gland, trachea, and esophagus demonstrate no significant findings. Question tiny hiatal hernia. Lungs/Pleura: Expiratory phase of respiration. Right upper and lower lobes peribronchovascular ground-glass and consolidative airspace opacities. Associated interlobular septal wall thickening. Lingular linear atelectasis. Subsegmental atelectasis within left lower lobe. No pulmonary nodule noted within the aerated lungs. No pulmonary mass. Trace right pleural effusion. No definite left pleural effusion. No pneumothorax. Upper Abdomen: No acute abnormality. Musculoskeletal: No abdominal wall hernia or abnormality. No suspicious lytic or blastic osseous lesions. No acute displaced fracture. Multilevel degenerative changes of the spine. Status post sternotomy wires with some of the sternotomy wires that appear fracture. IMPRESSION: 1. Right upper and lower lobe pulmonary findings suggestive of infection/inflammation. Followup PA and lateral chest X-ray is recommended in 3-4 weeks following therapy to ensure resolution and exclude underlying malignancy. 2. Tracer right pleural effusion. 3. A 1.3 cm enlarged precarinal lymph node and question of right hilar lymphadenopathy with limited evaluation on this noncontrast study. Finding likely reactive in etiology. Attention on follow-up. 4. Enlarged main pulmonary artery consistent with  pulmonary hypertension. 5. Enlarged right atrium. 6. Question tiny hiatal hernia. 7.  Aortic Atherosclerosis (ICD10-I70.0). Electronically Signed   By: Iven Finn M.D.   On: 02/18/2021 22:55   DG Chest Port 1 View  Result Date: 02/18/2021 CLINICAL DATA:  Weakness since Friday, dehydration, shortness of breath  EXAM: PORTABLE CHEST 1 VIEW COMPARISON:  Portable exam 1835 hours compared to 09/21/2015 FINDINGS: Enlargement of cardiac silhouette post CABG. Mediastinal contours and pulmonary vascularity normal. RIGHT upper lobe infiltrate consistent with pneumonia. Minimal atelectasis or infiltrate at LEFT base. No pleural effusion or pneumothorax. Bones demineralized. IMPRESSION: RIGHT upper lobe infiltrate consistent with pneumonia. Mild atelectasis versus infiltrate LEFT lower lobe. Enlargement of cardiac silhouette post CABG. Electronically Signed   By: Lavonia Dana M.D.   On: 02/18/2021 19:01   Scheduled Meds: . azithromycin  500 mg Oral QHS  . gabapentin  400 mg Oral Q breakfast  . gabapentin  800 mg Oral QHS  . guaiFENesin  600 mg Oral BID  . lactobacillus  1 g Oral TID WC  . levothyroxine  25 mcg Oral Q0600  . metoprolol succinate  50 mg Oral BID  . sacubitril-valsartan  1 tablet Oral BID  . sodium chloride flush  3 mL Intravenous Q12H  . Warfarin - Pharmacist Dosing Inpatient   Does not apply q1600   Continuous Infusions: . sodium chloride    . cefTRIAXone (ROCEPHIN)  IV 2 g (02/19/21 2124)     LOS: 1 day    Time spent: 25 mins    Nissim Fleischer, MD [facility]   If 7PM-7AM, please contact night-coverage

## 2021-02-20 NOTE — Evaluation (Signed)
Physical Therapy Evaluation Patient Details Name: Natasha Livingston MRN: [id number] DOB: 1933/12/13 Today's Date: 02/20/2021   History of Present Illness  This 87 years old female with history of atrial fibrillation on Coumadin, hypertension, chronic systolic heart failure with last known EF in 2019 of 35%, Neuropathy presented in the ED 02/18/21 with c/o: cough and shortness of breath ,chest x-ray showed possible right upper lobe infiltrate, diagnosed with pneumonia  Clinical Impression  Patient received inbed, appears drowsy. Patient not oriented to date and place. Chart indicates that pt lives alone but has caregiver, unsure of hours available.. No family present. Patient indicates that she  Is ambulatory in her home.  Assisted patient to sitting and stood x 1. Patient did not attempt ambulation at this time. Noted frequent coughing.  Pt admitted with above diagnosis.  Pt currently with functional limitations due to the deficits listed below (see PT Problem List). Pt will benefit from skilled PT to increase their independence and safety with mobility to allow discharge to the venue listed below.       Follow Up Recommendations Home health PT (if 24/7 caregivers)    Equipment Recommendations   (TBA)    Recommendations for Other Services       Precautions / Restrictions Precautions Precautions: Fall      Mobility  Bed Mobility Overal bed mobility: Needs Assistance Bed Mobility: Supine to Sit;Sit to Supine     Supine to sit: Min assist Sit to supine: Min assist   General bed mobility comments: multimodal cues to move legs to bed edge, to initiate activity, assist with trunk and legs    Transfers Overall transfer level: Needs assistance Equipment used: 1 person hand held assist Transfers: Sit to/from Stand Sit to Stand: Mod assist         General transfer comment: mod steady assist to rise from bed. took small side steps along bed edge with tactile  cues  Ambulation/Gait                Stairs            Wheelchair Mobility    Modified Rankin (Stroke Patients Only)       Balance Overall balance assessment: Needs assistance Sitting-balance support: Bilateral upper extremity supported;Feet supported Sitting balance-Leahy Scale: Fair     Standing balance support: During functional activity;Single extremity supported Standing balance-Leahy Scale: Poor Standing balance comment: requires support to stand                             Pertinent Vitals/Pain      Home Living Family/patient expects to be discharged to:: Private residence Living Arrangements: Non-relatives/Friends Available Help at Discharge: Family;Personal care attendant (per chart, lives alone, has caregiver during day) Type of Home: [place]: One level   Additional Comments: patient unable to provide details of home andwho stays with her and how many hours.    Prior Function Level of Independence: Needs assistance   Gait / Transfers Assistance Needed: pt. reports ambulates without ?Rw  ADL's / Homemaking Assistance Needed: pt. reports caregiver assists with bath, sits on a seat,        Hand Dominance        Extremity/Trunk Assessment   Upper Extremity Assessment Upper Extremity Assessment: Generalized weakness    Lower Extremity Assessment Lower Extremity Assessment: Generalized weakness    Cervical / Trunk Assessment Cervical / Trunk  Assessment: Normal  Communication      Cognition Arousal/Alertness: Awake/alert Behavior During Therapy: WFL for tasks assessed/performed Overall Cognitive Status: No family/caregiver present to determine baseline cognitive functioning Area of Impairment: Following commands                 Orientation Level: Time;Situation;Place   Memory: Decreased short-term memory Following Commands: Follows one step commands inconsistently       General Comments:  oriented to hospital with cues, frequent cues to initiate activity      General Comments      Exercises     Assessment/Plan    PT Assessment Patient needs continued PT services  PT Problem List Decreased strength;Decreased mobility;Decreased safety awareness;Decreased knowledge of precautions;Decreased activity tolerance;Decreased cognition;Decreased balance;Decreased knowledge of use of DME       PT Treatment Interventions DME instruction;Therapeutic activities;Cognitive remediation;Gait training;Therapeutic exercise;Patient/family education;Functional mobility training;Balance training    PT Goals (Current goals can be found in the Care Plan section)  Acute Rehab PT Goals Patient Stated Goal: pt not able PT Goal Formulation: Patient unable to participate in goal setting Time For Goal Achievement: 03/06/21 Potential to Achieve Goals: Fair    Frequency Min 3X/week   Barriers to discharge        Co-evaluation               AM-PAC PT "6 Clicks" Mobility  Outcome Measure Help needed turning from your back to your side while in a flat bed without using bedrails?: A Little Help needed moving from lying on your back to sitting on the side of a flat bed without using bedrails?: A Little Help needed moving to and from a bed to a chair (including a wheelchair)?: A Lot Help needed standing up from a chair using your arms (e.g., wheelchair or bedside chair)?: A Lot Help needed to walk in hospital room?: A Lot Help needed climbing 3-5 steps with a railing? : A Lot 6 Click Score: 14    End of Session   Activity Tolerance: Patient limited by fatigue Patient left: in bed;with call bell/phone within reach;with bed alarm set Nurse Communication: Mobility status PT Visit Diagnosis: Difficulty in walking, not elsewhere classified (R26.2);Unsteadiness on feet (R26.81)    Time: 6659-9357 PT Time Calculation (min) (ACUTE ONLY): 25 min   Charges:   PT Evaluation $PT Eval Low  Complexity: 1 Low PT Treatments $Therapeutic Activity: 8-22 mins        Tresa Endo PT Acute Rehabilitation Services Pager [phone] Office [phone]   Claretha Cooper 02/20/2021, 4:44 PM

## 2021-02-20 NOTE — Significant Event (Signed)
Rapid Response Event Note   Reason for Call :  Irregular breathing pattern and 7 beat run of v-tach.  Initial Focused Assessment:  Observed patient's respiratory pattern to vary between tachypnea and apnea, although patient remained responsive. Patient reports sore throat, and RR RN observed she is holding her chest. Patient initially denied chest pain or discomfort, but reports mild discomfort in her chest a few minutes later. Patient also denies feelings of nervousness or anxiety, but this cannot be ruled out. Lung sounds are clear in upper lobes, diminished bilaterally. Heart sounds are irregular, telemetry rhythm shows atrial fibrillation which is chronic for her. Vital signs taken just prior to nurse arrival are stable without evidence of abnormality.      Interventions:  Paged on call provider who states she will come to bedside to assess. Provider states patient may have exacerbation of CHF, therefore, orders given to administer Lasix 20mg  IV push x 1, start oxygen 2L/min via [place] for respiratory support, and monitor intake/output. Bedside staff placed purwick for output monitoring, administered Lasix, and started oxygen as prescribed. Continuous oxygen saturation monitoring initiated.   Plan of Care:  Monitor effectiveness of Lasix, breathing patterns, and call back to provider and RR RN if no improvement.    Event Summary:   MD Notified: Lorra Hals (came to bedside) 2311 Call Time: 2258 Arrival Time: 2305 End Time: [place], RN

## 2021-02-21 LAB — BASIC METABOLIC PANEL WITH GFR
Anion gap: 10 (ref 5–15)
BUN: 20 mg/dL (ref 8–23)
CO2: 22 mmol/L (ref 22–32)
Calcium: 8.9 mg/dL (ref 8.9–10.3)
Chloride: 104 mmol/L (ref 98–111)
Creatinine, Ser: 0.75 mg/dL (ref 0.44–1.00)
GFR, Estimated: >60 mL/min
Glucose, Bld: 146 mg/dL — ABNORMAL HIGH (ref 70–99)
Potassium: 3.1 mmol/L — ABNORMAL LOW (ref 3.5–5.1)
Sodium: 136 mmol/L (ref 135–145)

## 2021-02-21 LAB — GLUCOSE, CAPILLARY
Glucose-Capillary: 149 mg/dL — ABNORMAL HIGH (ref 70–99)
Glucose-Capillary: 175 mg/dL — ABNORMAL HIGH (ref 70–99)

## 2021-02-21 LAB — CBC
HCT: 35.5 % — ABNORMAL LOW (ref 36.0–46.0)
Hemoglobin: 11.8 g/dL — ABNORMAL LOW (ref 12.0–15.0)
MCH: 32.9 pg (ref 26.0–34.0)
MCHC: 33.2 g/dL (ref 30.0–36.0)
MCV: 98.9 fL (ref 80.0–100.0)
Platelets: 194 K/uL (ref 150–400)
RBC: 3.59 MIL/uL — ABNORMAL LOW (ref 3.87–5.11)
RDW: 15.3 % (ref 11.5–15.5)
WBC: 11 K/uL — ABNORMAL HIGH (ref 4.0–10.5)
nRBC: 0.4 % — ABNORMAL HIGH (ref 0.0–0.2)

## 2021-02-21 LAB — PROCALCITONIN: Procalcitonin: 0.95 ng/mL

## 2021-02-21 LAB — PROTIME-INR
INR: 3.6 — ABNORMAL HIGH (ref 0.8–1.2)
Prothrombin Time: 35.8 seconds — ABNORMAL HIGH (ref 11.4–15.2)

## 2021-02-21 MED ORDER — POTASSIUM CHLORIDE 20 MEQ PO PACK
40.0000 meq | PACK | Freq: Once | ORAL | Status: AC
  Administered 2021-02-21: 40 meq via ORAL
  Filled 2021-02-21: qty 2

## 2021-02-21 MED ORDER — FUROSEMIDE 10 MG/ML IJ SOLN
40.0000 mg | Freq: Every day | INTRAMUSCULAR | Status: DC
  Administered 2021-02-21 – 2021-02-24 (×4): 40 mg via INTRAVENOUS
  Filled 2021-02-21 (×4): qty 4

## 2021-02-21 NOTE — Progress Notes (Signed)
PROGRESS NOTE    Natasha Livingston  GEX:[id number] DOB: 17-Apr-1934 DOA: 02/18/2021 PCP: Cyndi Bender, PA-C    Brief Narrative: This 87 years old female with history of atrial fibrillation on Coumadin, hypertension, chronic systolic heart failure with last known EF in 2019 of 35%, Neuropathy presented in the ED with c/o: cough and shortness of breath for 3 days.  Patient reports coughing up yellow phlegm in the ED,  chest x-ray showed possible right upper lobe infiltrate.  Patient is diagnosed with pneumonia and started on IV antibiotics including Rocephin and Zithromax.  Assessment & Plan:   Principal Problem:   Right upper lobe pneumonia Active Problems:   Hypertension   Neuropathy   Long term (current) use of anticoagulants - on coumadin for afib   Paroxysmal atrial fibrillation (HCC)   Chronic combined systolic and diastolic CHF (congestive heart failure) ([facility]) - echo 02-2018 LVEF 35-40%   Hyperglycemia   CAP (community acquired pneumonia)   Community-acquired pneumonia; Patient presented with cough and shortness of breath.  CT chest without contrast shows right upper and lower lobe peribronchovascular groundglass and consolidative airspace opacities suggestive of infection/inflammation.  Patient started on Rocephin and Zithromax for 5 days. Follow-up chest x-ray in 3 to 4 weeks,  recommended to ensure complete resolution of findings.  Blood cultures are negative to date.  Follow blood culture results.  Paroxysmal atrial fibrillation: Heart rate is well controlled, continue metoprolol 50 mg p.o. twice daily, continue monitoring on telemetry.   Continue warfarin for anticoagulation.  Pharmacy to manage dosing of warfarin. INR is supra therapeutic,  continue to hold Coumadin  Chronic systolic/diastolic heart failure: Last echocardiogram from 2019 showed EF of 35 to 40%.  Does not appear to be in fluid overload at this time.  Currently euvolemic. Lasix resumed today since patient  has acute shortness of breath last night. Continue Entresto, resume lasix.  Hypertension: blood pressure stable, continue metoprolol, sacubitril/valsartan.  Hypothyroidism: continue Synthroid  Peripheral neuropathy: Continue Neurontin.  Hyperglycemia: hemoglobin A1c has been ordered.  Check CBG twice daily    DVT prophylaxis: Coumadin Code Status: Full code Family Communication: Spoke with son Disposition Plan:   Anti-infectives (From admission, onward)   Start     Dose/Rate Route Frequency Ordered Stop   02/19/21 2200  cefTRIAXone (ROCEPHIN) 2 g in sodium chloride 0.9 % 100 mL IVPB        2 g 200 mL/hr over 30 Minutes Intravenous Every 24 hours 02/19/21 0052 02/24/21 2159   02/19/21 2200  azithromycin (ZITHROMAX) tablet 500 mg        500 mg Oral Daily at bedtime 02/19/21 0052 02/24/21 2159   02/18/21 2115  cefTRIAXone (ROCEPHIN) 2 g in sodium chloride 0.9 % 100 mL IVPB        2 g 200 mL/hr over 30 Minutes Intravenous  Once 02/18/21 2102 02/18/21 2300   02/18/21 2115  azithromycin (ZITHROMAX) 500 mg in sodium chloride 0.9 % 250 mL IVPB        500 mg 250 mL/hr over 60 Minutes Intravenous  Once 02/18/21 2102 02/19/21 0130       Consultants:    None.  Procedures:  None. Antimicrobials:  Anti-infectives (From admission, onward)   Start     Dose/Rate Route Frequency Ordered Stop   02/19/21 2200  cefTRIAXone (ROCEPHIN) 2 g in sodium chloride 0.9 % 100 mL IVPB        2 g 200 mL/hr over 30 Minutes Intravenous Every 24 hours 02/19/21 0052  02/24/21 2159   02/19/21 2200  azithromycin (ZITHROMAX) tablet 500 mg        500 mg Oral Daily at bedtime 02/19/21 0052 02/24/21 2159   02/18/21 2115  cefTRIAXone (ROCEPHIN) 2 g in sodium chloride 0.9 % 100 mL IVPB        2 g 200 mL/hr over 30 Minutes Intravenous  Once 02/18/21 2102 02/18/21 2300   02/18/21 2115  azithromycin (ZITHROMAX) 500 mg in sodium chloride 0.9 % 250 mL IVPB        500 mg 250 mL/hr over 60 Minutes Intravenous   Once 02/18/21 2102 02/19/21 0130      Subjective: Patient was seen and examined at bedside.  Overnight events noted.   Patient had acute shortness of breath requiring Lasix last night.   She was hypoxic,  started on 2 L of supplemental oxygen sats 95%. Patient reports feeling better, denies any shortness of breath,   Objective: Vitals:   02/21/21 0400 02/21/21 0635 02/21/21 0708 02/21/21 0847  BP:  (!) 122/93  (!) 135/98  Pulse:  93    Resp: 13 19    Temp:  97.7 F (36.5 C)    TempSrc:      SpO2:  94% 96% 97%  Weight:  67.8 kg    Height:        Intake/Output Summary (Last 24 hours) at 02/21/2021 1120 Last data filed at 02/21/2021 0945 Gross per 24 hour  Intake 760 ml  Output 500 ml  Net 260 ml   Filed Weights   02/19/21 0546 02/20/21 0500 02/21/21 0635  Weight: 67.6 kg 67.7 kg 67.8 kg    Examination:  General exam: Appears calm and comfortable, not in any acute distress. Respiratory system: Clear to auscultation. Respiratory effort normal. Cardiovascular system: S1 & S2 heard, RRR. No JVD, murmurs, rubs, gallops or clicks. No pedal edema. Gastrointestinal system: Abdomen is nondistended, soft and nontender. No organomegaly or masses felt. Normal bowel sounds heard. Central nervous system: Alert and oriented. No focal neurological deficits. Extremities: Symmetric 5 x 5 power.  No edema, no cyanosis, no clubbing. Skin: No rashes, lesions or ulcers Psychiatry: Judgement and insight appear normal. Mood & affect appropriate.     Data Reviewed: I have personally reviewed following labs and imaging studies  CBC: Recent Labs  Lab 02/18/21 1833 02/19/21 0522 02/20/21 0445 02/21/21 0457  WBC 13.7* 10.7* 11.1* 11.0*  NEUTROABS 11.3* 8.6*  --   --   HGB 12.6 12.5 12.8 11.8*  HCT 38.3 37.3 37.5 35.5*  MCV 100.3* 99.7 97.7 98.9  PLT 221 183 210 220   Basic Metabolic Panel: Recent Labs  Lab 02/18/21 1833 02/18/21 2135 02/19/21 0522 02/20/21 0445 02/21/21 0457   NA 137  --  137 136 136  K 4.0  --  3.7 3.7 3.1*  CL 102  --  105 104 104  CO2 24  --  20* 21* 22  GLUCOSE 189*  --  177* 159* 146*  BUN 25*  --  23 22 20   CREATININE 0.89  --  0.82 0.76 0.75  CALCIUM 9.6  --  8.9 9.1 8.9  MG  --  2.1 2.0  --   --    GFR: Estimated Creatinine Clearance: 50 mL/min (by C-G formula based on SCr of 0.75 mg/dL). Liver Function Tests: Recent Labs  Lab 02/18/21 1833 02/19/21 0522  AST 36 33  ALT 31 30  ALKPHOS 156* 142*  BILITOT 3.7* 3.0*  PROT 8.2* 6.9  ALBUMIN 3.7 3.2*   No results for input(s): LIPASE, AMYLASE in the last 168 hours. No results for input(s): AMMONIA in the last 168 hours. Coagulation Profile: Recent Labs  Lab 02/18/21 2135 02/19/21 0053 02/19/21 0522 02/20/21 0445 02/21/21 0457  INR 5.5* 3.1* 3.4* 3.7* 3.6*   Cardiac Enzymes: No results for input(s): CKTOTAL, CKMB, CKMBINDEX, TROPONINI in the last 168 hours. BNP (last 3 results) No results for input(s): PROBNP in the last 8760 hours. HbA1C: No results for input(s): HGBA1C in the last 72 hours. CBG: Recent Labs  Lab 02/19/21 0748 02/19/21 2056 02/20/21 0820 02/20/21 1656 02/21/21 0744  GLUCAP 159* 188* 200* 192* 149*   Lipid Profile: No results for input(s): CHOL, HDL, LDLCALC, TRIG, CHOLHDL, LDLDIRECT in the last 72 hours. Thyroid Function Tests: Recent Labs    02/19/21 0522  TSH 1.840   Anemia Panel: No results for input(s): VITAMINB12, FOLATE, FERRITIN, TIBC, IRON, RETICCTPCT in the last 72 hours. Sepsis Labs: Recent Labs  Lab 02/18/21 2135 02/19/21 0053 02/19/21 0534  LATICACIDVEN 2.3* 2.9* 2.0*    Recent Results (from the past 240 hour(s))  Resp Panel by RT-PCR (Flu A&B, Covid) Nasopharyngeal Swab     Status: None   Collection Time: 02/18/21  9:15 PM   Specimen: Nasopharyngeal Swab; Nasopharyngeal(NP) swabs in vial transport medium  Result Value Ref Range Status   SARS Coronavirus 2 by RT PCR NEGATIVE NEGATIVE Final    Comment:  (NOTE) SARS-CoV-2 target nucleic acids are NOT DETECTED.  The SARS-CoV-2 RNA is generally detectable in upper respiratory specimens during the acute phase of infection. The lowest concentration of SARS-CoV-2 viral copies this assay can detect is 138 copies/mL. A negative result does not preclude SARS-Cov-2 infection and should not be used as the sole basis for treatment or other patient management decisions. A negative result may occur with  improper specimen collection/handling, submission of specimen other than nasopharyngeal swab, presence of viral mutation(s) within the areas targeted by this assay, and inadequate number of viral copies(<138 copies/mL). A negative result must be combined with clinical observations, patient history, and epidemiological information. The expected result is Negative.  Fact Sheet for Patients:  EntrepreneurPulse.com.au  Fact Sheet for Healthcare Providers:  IncredibleEmployment.be  This test is no t yet approved or cleared by the [place] FDA and  has been authorized for detection and/or diagnosis of SARS-CoV-2 by FDA under an Emergency Use Authorization (EUA). This EUA will remain  in effect (meaning this test can be used) for the duration of the COVID-19 declaration under Section 564(b)(1) of the Act, 21 U.S.C.section 360bbb-3(b)(1), unless the authorization is terminated  or revoked sooner.       Influenza A by PCR NEGATIVE NEGATIVE Final   Influenza B by PCR NEGATIVE NEGATIVE Final    Comment: (NOTE) The Xpert Xpress SARS-CoV-2/FLU/RSV plus assay is intended as an aid in the diagnosis of influenza from Nasopharyngeal swab specimens and should not be used as a sole basis for treatment. Nasal washings and aspirates are unacceptable for Xpert Xpress SARS-CoV-2/FLU/RSV testing.  Fact Sheet for Patients: EntrepreneurPulse.com.au  Fact Sheet for Healthcare  Providers: IncredibleEmployment.be  This test is not yet approved or cleared by the [place] FDA and has been authorized for detection and/or diagnosis of SARS-CoV-2 by FDA under an Emergency Use Authorization (EUA). This EUA will remain in effect (meaning this test can be used) for the duration of the COVID-19 declaration under Section 564(b)(1) of the Act, 21 U.S.C. section 360bbb-3(b)(1), unless the authorization is  terminated or revoked.  Performed at [facility], [place] [address] [place], [place] [address]   Blood culture (routine x 2)     Status: None (Preliminary result)   Collection Time: 02/18/21  9:35 PM   Specimen: BLOOD RIGHT HAND  Result Value Ref Range Status   Specimen Description   Final    BLOOD RIGHT HAND Performed at [facility] [address] [place], [place] [address]    Special Requests   Final    BOTTLES DRAWN AEROBIC AND ANAEROBIC Blood Culture results may not be optimal due to an inadequate volume of blood received in culture bottles Performed at [facility] [address], [place], [place] [address]    Culture   Final    NO GROWTH 2 DAYS Performed at [facility] [address] [place], [place] [address]    Report Status PENDING  Incomplete  Blood culture (routine x 2)     Status: None (Preliminary result)   Collection Time: 02/18/21  9:35 PM   Specimen: BLOOD  Result Value Ref Range Status   Specimen Description   Final    BLOOD RIGHT ANTECUBITAL Performed at [facility] [address], [place],  [address]    Special Requests   Final    BOTTLES DRAWN AEROBIC AND ANAEROBIC Blood Culture results may not be optimal due to an inadequate volume of blood received in culture bottles Performed at [facility] [address], [place],  [address]    Culture   Final    NO GROWTH 2 DAYS Performed at [facility] [address], [place],  [address]    Report Status PENDING  Incomplete    Radiology Studies: No results found. Scheduled Meds: . azithromycin  500 mg Oral QHS  . furosemide  40 mg Intravenous Daily  . gabapentin  400 mg Oral Q breakfast  . gabapentin  800 mg Oral QHS  . guaiFENesin  600 mg Oral BID  . lactobacillus  1 g Oral TID WC  . levothyroxine  25 mcg Oral Q0600  . metoprolol succinate  50 mg Oral BID  . potassium chloride  40 mEq Oral Once  . sacubitril-valsartan  1 tablet Oral BID  . sodium chloride flush  3 mL Intravenous Q12H  . Warfarin - Pharmacist Dosing Inpatient   Does not apply q1600   Continuous Infusions: . sodium chloride    . cefTRIAXone (ROCEPHIN)  IV 2 g (02/20/21 2146)     LOS: 2 days    Time spent: 25 mins    Abdelaziz Westenberger, MD [facility]   If 7PM-7AM, please contact night-coverage

## 2021-02-21 NOTE — Evaluation (Signed)
Occupational Therapy Evaluation Patient Details Name: Natasha Livingston MRN: [id number] DOB: Jan 29, 1934 Today's Date: 02/21/2021    History of Present Illness This 87 years old female with history of atrial fibrillation on Coumadin, hypertension, chronic systolic heart failure with last known EF in 2019 of 35%, Neuropathy presented in the ED 02/18/21 with c/o: cough and shortness of breath ,chest x-ray showed possible right upper lobe infiltrate, diagnosed with pneumonia   Clinical Impression   Natasha Livingston is an 87 year old woman who presents pleasantly confused. She is alert to self but is unable to state her age, where she is or the month or year. On evaluation she presents with good upper body strength but exhibits generalized weakness, decreased activity tolerance and impaired balance resulting in a decline in functional abilities. Patient reports, though this will need be collaborated by family, that she has a lady with her 7 hours a day Monday through Friday. Reports she doesn't use a walker and is independent with BADLs. From chart review of provider reports patient has a history of falling. Today she overall needs min assist for ADLs and safety. Suspect patient will improve and will need [facility] services at discharge. However, if patient's cognition doesn't improve to her baseline and because she is at risk for falls and further injury if she doesn't have 24/7 assistance at home, at least initially, she may need short term rehab. Will need to follow up with patient's family in regards to [place] and assistance at home.     Follow Up Recommendations  Home health OT;SNF    Equipment Recommendations  Other (comment) (TBD)    Recommendations for Other Services       Precautions / Restrictions Precautions Precautions: Fall Restrictions Weight Bearing Restrictions: No      Mobility Bed Mobility Overal bed mobility: Needs Assistance Bed Mobility: Supine to Sit                 Transfers Overall transfer level: Needs assistance Equipment used: Rolling walker (2 wheeled) Transfers: Sit to/from [facility] Sit to Stand: Min guard Stand pivot transfers: Min guard            Balance Overall balance assessment: Mild deficits observed, not formally tested                                         ADL either performed or assessed with clinical judgement   ADL Overall ADL's : Needs assistance/impaired Eating/Feeding: Set up;Sitting   Grooming: Set up;Wash/dry hands;Sitting   Upper Body Bathing: Set up;Sitting   Lower Body Bathing: Min guard;Sit to/from stand   Upper Body Dressing : Set up;Sitting   Lower Body Dressing: Min guard;Set up;Sit to/from stand   Toilet Transfer: Min guard;BSC;RW   Toileting- Water quality scientist and Hygiene: Min guard;Sit to/from stand       Functional mobility during ADLs: Min guard       Vision Patient Visual Report: No change from baseline       Perception     Praxis      Pertinent Vitals/Pain Pain Assessment: No/denies pain     Hand Dominance     Extremity/Trunk Assessment Upper Extremity Assessment Upper Extremity Assessment: Overall WFL for tasks assessed   Lower Extremity Assessment Lower Extremity Assessment: Defer to PT evaluation   Cervical / Trunk Assessment Cervical / Trunk Assessment: Normal   Communication  Communication Communication: No difficulties   Cognition Arousal/Alertness: Awake/alert Behavior During Therapy: WFL for tasks assessed/performed Overall Cognitive Status: No family/caregiver present to determine baseline cognitive functioning Area of Impairment: Orientation;Attention;Memory;Following commands;Awareness                 Orientation Level: Disoriented to;Place;Time;Situation Current Attention Level: Selective Memory: Decreased short-term memory Following Commands: Follows one step commands consistently   Awareness:  Emergent       General Comments  Per chart review(prior notes) patient has a history of falling at home.    Exercises     Shoulder Instructions      Home Living Family/patient expects to be discharged to:: Private residence Living Arrangements: Alone Available Help at Discharge: Family;Personal care attendant Type of Home: House       Home Layout: One level     Bathroom Shower/Tub: Tub/shower unit         Home Equipment: Environmental consultant - 2 wheels   Additional Comments: Patient is a somewhat unreliable historian as she is unable to answer orientation questions but reports she has a lady that stays with her about 7 hours during the week that assists with IADLs. Her son comes by daily she says but appears to be alone at night.      Prior Functioning/Environment Level of Independence: Needs assistance  Gait / Transfers Assistance Needed: Does not use a device ADL's / Homemaking Assistance Needed: Independent with ADLs. Caregiver for ADLs and driving.            OT Problem List: Decreased activity tolerance;Impaired balance (sitting and/or standing);Decreased cognition;Decreased safety awareness;Decreased knowledge of use of DME or AE      OT Treatment/Interventions: Self-care/ADL training;DME and/or AE instruction;Therapeutic activities;Cognitive remediation/compensation;Patient/family education;Balance training;Therapeutic exercise    OT Goals(Current goals can be found in the care plan section) Acute Rehab OT Goals Patient Stated Goal: to go home at discharge OT Goal Formulation: With patient Time For Goal Achievement: 06/26/21 Potential to Achieve Goals: Good  OT Frequency: Min 2X/week   Barriers to D/C:            Co-evaluation              AM-PAC OT "6 Clicks" Daily Activity     Outcome Measure Help from another person eating meals?: A Little Help from another person taking care of personal grooming?: A Little Help from another person toileting, which  includes using toliet, bedpan, or urinal?: A Little Help from another person bathing (including washing, rinsing, drying)?: A Little Help from another person to put on and taking off regular upper body clothing?: A Little Help from another person to put on and taking off regular lower body clothing?: A Little 6 Click Score: 18   End of Session Nurse Communication: Mobility status (chair alarm batteries dying Investment banker, corporate and tech informed))  Activity Tolerance: Patient tolerated treatment well Patient left: in chair;with call bell/phone within reach;with chair alarm set  OT Visit Diagnosis: Other symptoms and signs involving cognitive function                Time: 7341-9379 OT Time Calculation (min): 17 min Charges:  OT General Charges $OT Visit: 1 Visit OT Evaluation $OT Eval Low Complexity: 1 Low  Demarrion Meiklejohn, OTR/L [facility]  Office [phone] Pager: [place] 02/21/2021, 9:45 AM

## 2021-02-21 NOTE — Progress Notes (Signed)
[facility] for Coumadin Indication: atrial fibrillation  Allergies  Allergen Reactions  . Amiodarone Other (See Comments)    unknown  . Crestor [Rosuvastatin Calcium] Other (See Comments)    unknown  . Lipitor [Atorvastatin Calcium] Swelling    Patient Measurements: Height: 5\' 7"  (170.2 cm) Weight: 67.8 kg (149 lb 7.6 oz) IBW/kg (Calculated) : 61.6  Vital Signs: Temp: 97.7 F (36.5 C) (04/21 0635) BP: 135/98 (04/21 0847) Pulse Rate: 93 (04/21 0635)  Labs: Recent Labs    02/18/21 1833 02/18/21 2051 02/18/21 2135 02/19/21 0522 02/20/21 0445 02/21/21 0457  HGB 12.6  --   --  12.5 12.8 11.8*  HCT 38.3  --   --  37.3 37.5 35.5*  PLT 221  --   --  183 210 194  LABPROT  --   --    < > 34.4* 36.9* 35.8*  INR  --   --    < > 3.4* 3.7* 3.6*  CREATININE 0.89  --   --  0.82 0.76 0.75  TROPONINIHS 13 11  --   --   --   --    < > = values in this interval not displayed.    Estimated Creatinine Clearance: 50 mL/min (by C-G formula based on SCr of 0.75 mg/dL).   Medications:  Warfarin PTA- 5mg  daily except 2.5mg  on Tues/Thur/Sat.  LD 4/18 @ 10a  Assessment: [name] with hx of afib on Coumadin.  INR was SUPRAtherapeutic on admission at 5.5.  Elevated INR likely due to acute illness & poor oral intake for past several days. Rocephin + Zithromax given in ED which can also interact to elevate INR.    Of note, patient has been on current outpatient regimen since Dec 2021 with mostly therapeutic INR.   CBC WNL, no bleeding noted.   02/21/21 9:48 AM  - INR remains elevated and stable from yesterday - Hgb slightly low today, Plt stable WNL - Patient is on azithromycin which may potentiate warfarin - no bleeding reported  Goal of Therapy:  INR 2-3   Plan:  Continue to hold Coumadin for elevated INR Daily PT/INR Monitor closely for s/sx of bleeding  Austine Wiedeman A  02/21/2021,9:48 AM

## 2021-02-21 NOTE — Progress Notes (Signed)
Received call from RN for abnormal breathing pattern. Upon arrival, BBS clear to auscultation and patient on room air with stable VS. Chronic atrial fib noted with PVC's and reported 7 run of Vtach. RR difficult to assess due to Biot or ataxic like breathing pattern. Rapid Response RN notified. Patient denies pain or difficulty breathing. Easily arousable and can hold conversation appropriately.

## 2021-02-22 LAB — CBC
HCT: 39.9 % (ref 36.0–46.0)
Hemoglobin: 13.1 g/dL (ref 12.0–15.0)
MCH: 33.4 pg (ref 26.0–34.0)
MCHC: 32.8 g/dL (ref 30.0–36.0)
MCV: 101.8 fL — ABNORMAL HIGH (ref 80.0–100.0)
Platelets: 254 10*3/uL (ref 150–400)
RBC: 3.92 MIL/uL (ref 3.87–5.11)
RDW: 15.7 % — ABNORMAL HIGH (ref 11.5–15.5)
WBC: 11.4 10*3/uL — ABNORMAL HIGH (ref 4.0–10.5)
nRBC: 0.6 % — ABNORMAL HIGH (ref 0.0–0.2)

## 2021-02-22 LAB — PROTIME-INR
INR: 4.2 (ref 0.8–1.2)
Prothrombin Time: 40.8 seconds — ABNORMAL HIGH (ref 11.4–15.2)

## 2021-02-22 LAB — GLUCOSE, CAPILLARY
Glucose-Capillary: 151 mg/dL — ABNORMAL HIGH (ref 70–99)
Glucose-Capillary: 131 mg/dL — ABNORMAL HIGH (ref 70–99)

## 2021-02-22 LAB — BASIC METABOLIC PANEL
Anion gap: 10 (ref 5–15)
BUN: 21 mg/dL (ref 8–23)
CO2: 22 mmol/L (ref 22–32)
Calcium: 9.4 mg/dL (ref 8.9–10.3)
Chloride: 103 mmol/L (ref 98–111)
Creatinine, Ser: 0.86 mg/dL (ref 0.44–1.00)
GFR, Estimated: >60 mL/min (ref >60)
Glucose, Bld: 154 mg/dL — ABNORMAL HIGH (ref 70–99)
Potassium: 4.3 mmol/L (ref 3.5–5.1)
Sodium: 135 mmol/L (ref 135–145)

## 2021-02-22 NOTE — Progress Notes (Signed)
PT Cancellation Note  Patient Details Name: Nasya Vincent MRN: [id number] DOB: [birth year]   Cancelled Treatment:    Reason Eval/Treat Not Completed: Fatigue/lethargy limiting ability to participate, patient recently back to bed. Patient somewhat restless. Removed [place]. SPO2 97%, Periodic rapid respirations. Replaced [place]. Will check back another time as schedule allows.   Claretha Cooper 02/22/2021, 10:29 AM [place] Pager [phone] Office [phone]

## 2021-02-22 NOTE — TOC Initial Note (Signed)
Transition of Care [facility]) - Initial/Assessment Note    Patient Details  Name: Natasha Livingston MRN: [id number] Date of Birth: Mar 08, 1934  Transition of Care [facility]) CM/SW Contact:    Dessa Phi, RN Phone Number: 02/22/2021, 2:29 PM  Clinical Narrative: From home alone-has private caregivers 7 hrs/day-son Gerald Stabs to increase fro home,family also stays intermittently. Has rw;w/c. Noted on 02-may need-will monitor. Informed Chris of PT recc HHPT/OT-he will discuss with his brother for [facility] aency of choice.Family has own transport home.                  Expected Discharge Plan: [place] Barriers to Discharge: Continued Medical Work up   Patient Goals and CMS Choice Patient states their goals for this hospitalization and ongoing recovery are:: go home CMS Medicare.gov Compare Post Acute Care list provided to:: Patient Represenative (must comment) Gerald Stabs son [phone]) Choice offered to / list presented to : Adult Children  Expected Discharge Plan and Services Expected Discharge Plan: [place]   Discharge Planning Services: CM Consult Post Acute Care Choice: [place] arrangements for the past 2 months: Single Family Home                                      Prior Living Arrangements/Services Living arrangements for the past 2 months: Single Family Home Lives with:: Self Patient language and need for interpreter reviewed:: Yes Do you feel safe going back to the place where you live?: Yes      Need for Family Participation in Patient Care: No (Comment) Care giver support system in place?: Yes (comment) Current home services: DME,Other (comment) (rw; w/c;Private care givers-7hrs day) Criminal Activity/Legal Involvement Pertinent to Current Situation/Hospitalization: No - Comment as needed  Activities of Daily Living Home Assistive Devices/Equipment: Cane (specify quad or straight) ADL Screening (condition at time of  admission) Patient's cognitive ability adequate to safely complete daily activities?: Yes Is the patient deaf or have difficulty hearing?: No Does the patient have difficulty seeing, even when wearing glasses/contacts?: No Does the patient have difficulty concentrating, remembering, or making decisions?: Yes Patient able to express need for assistance with ADLs?: Yes Does the patient have difficulty dressing or bathing?: Yes Independently performs ADLs?: No Communication: Independent Dressing (OT): Needs assistance Is this a change from baseline?: Pre-admission baseline Grooming: Independent Feeding: Independent Bathing: Needs assistance Is this a change from baseline?: Pre-admission baseline Toileting: Needs assistance Is this a change from baseline?: Pre-admission baseline In/Out Bed: Needs assistance Is this a change from baseline?: Pre-admission baseline Walks in Home: Needs assistance Is this a change from baseline?: Pre-admission baseline Does the patient have difficulty walking or climbing stairs?: Yes Weakness of Legs: Both Weakness of Arms/Hands: Both  Permission Sought/Granted Permission sought to share information with : Case Manager Permission granted to share information with : Yes, Verbal Permission Granted  Share Information with NAME: Case Manager     Permission granted to share info w Relationship: Gerald Stabs son [phone]     Emotional Assessment Appearance:: Appears stated age Attitude/Demeanor/Rapport: Gracious Affect (typically observed): Accepting Orientation: : Oriented to Self Alcohol / Substance Use: Not Applicable Psych Involvement: No (comment)  Admission diagnosis:  Acute dyspnea [R06.00] Community acquired pneumonia of right upper lobe of lung [J18.9] CAP (community acquired pneumonia) [J18.9] Patient Active Problem List   Diagnosis Date Noted  .  CAP (community acquired pneumonia) 02/19/2021  . Chronic combined systolic and diastolic CHF  (congestive heart failure) ([place]) - echo 02-2018 LVEF 35-40% 02/18/2021  . Hyperglycemia 02/18/2021  . Onychomycosis of left great toe 09/10/2015  . Encounter for therapeutic drug monitoring 12/07/2013  . Depression 12/07/2013  . Paroxysmal atrial fibrillation ([place]) 08/18/2013  . Hypothyroid 04/20/2013  . Dermatitis 12/31/2012  . Benign hypertensive heart disease without heart failure 12/24/2011  . Long term (current) use of anticoagulants - on coumadin for afib 08/27/2011  . Edema of foot 04/28/2011  . Malaise and fatigue 04/15/2011  . Chest discomfort   . Dizziness   . MVP (mitral valve prolapse)   . Right upper lobe pneumonia   . Bruises easily   . Back pain   . Forgetfulness   . Atrial fibrillation ([place])   . Hypertension   . Hypothyroidism   . Aortic stenosis   . Hyperlipidemia   . Diverticulitis   . Interstitial nephritis   . Neuropathy   . Ischemic heart disease   . Atrial fibrillation ([place]) 02/03/2011   PCP:  Cyndi Bender, PA-C Pharmacy:   [id number] - [place], [address] [place] [address] Phone: [phone] Fax: [phone]     Social Determinants of Health (SDOH) Interventions    Readmission Risk Interventions No flowsheet data found.

## 2021-02-22 NOTE — Progress Notes (Signed)
PROGRESS NOTE    Natasha Livingston  IWP:[id number] DOB: 12-13-1933 DOA: 02/18/2021 PCP: Cyndi Bender, PA-C    Brief Narrative: This 87 years old female with history of atrial fibrillation on Coumadin, hypertension, chronic systolic heart failure with last known EF in 2019 of 35%, Neuropathy presented in the ED with c/o: cough and shortness of breath for 3 days.  Patient reports coughing up yellow phlegm in the ED,  chest x-ray showed possible right upper lobe infiltrate.  Patient is diagnosed with pneumonia and started on IV antibiotics including Rocephin and Zithromax.  Assessment & Plan:   Principal Problem:   Right upper lobe pneumonia Active Problems:   Hypertension   Neuropathy   Long term (current) use of anticoagulants - on coumadin for afib   Paroxysmal atrial fibrillation (HCC)   Chronic combined systolic and diastolic CHF (congestive heart failure) ([place]) - echo 02-2018 LVEF 35-40%   Hyperglycemia   CAP (community acquired pneumonia)   Community-acquired pneumonia; Patient presented with cough and shortness of breath.   CT chest without contrast shows right upper and lower lobe peribronchovascular groundglass and consolidative airspace opacities suggestive of infection/inflammation.  Patient started on Rocephin and Zithromax for 5 days. Follow-up chest x-ray in 3 to 4 weeks,  recommended to ensure complete resolution of findings.  Procalcitonin 0.95,  Blood cultures are negative to date.  Follow blood culture results.  Paroxysmal atrial fibrillation: Heart rate is well controlled, continue metoprolol 50 mg p.o. twice daily, continue monitoring on telemetry.   Pharmacy to manage dosing of warfarin. INR is supra therapeutic,  continue to hold Coumadin.  Chronic systolic/diastolic heart failure: Last echocardiogram from 2019 showed EF of 35 to 40%.   Does not appear to be in fluid overload at this time.  Currently euvolemic.  Lasix resumed today since patient has acute  shortness of breath last night. Continue Entresto, resume lasix.  Hypertension: blood pressure stable, continue metoprolol, sacubitril/valsartan.  Hypothyroidism: continue Synthroid  Peripheral neuropathy: Continue Neurontin.  Hyperglycemia: hemoglobin A1c has been ordered.  Check CBG twice daily    DVT prophylaxis: Coumadin Code Status: Full code Family Communication: Spoke with son Disposition Plan:   Anti-infectives (From admission, onward)   Start     Dose/Rate Route Frequency Ordered Stop   02/19/21 2200  cefTRIAXone (ROCEPHIN) 2 g in sodium chloride 0.9 % 100 mL IVPB        2 g 200 mL/hr over 30 Minutes Intravenous Every 24 hours 02/19/21 0052 02/24/21 2159   02/19/21 2200  azithromycin (ZITHROMAX) tablet 500 mg        500 mg Oral Daily at bedtime 02/19/21 0052 02/24/21 2159   02/18/21 2115  cefTRIAXone (ROCEPHIN) 2 g in sodium chloride 0.9 % 100 mL IVPB        2 g 200 mL/hr over 30 Minutes Intravenous  Once 02/18/21 2102 02/18/21 2300   02/18/21 2115  azithromycin (ZITHROMAX) 500 mg in sodium chloride 0.9 % 250 mL IVPB        500 mg 250 mL/hr over 60 Minutes Intravenous  Once 02/18/21 2102 02/19/21 0130     Consultants:    None.  Procedures:  None. Antimicrobials:  Anti-infectives (From admission, onward)   Start     Dose/Rate Route Frequency Ordered Stop   02/19/21 2200  cefTRIAXone (ROCEPHIN) 2 g in sodium chloride 0.9 % 100 mL IVPB        2 g 200 mL/hr over 30 Minutes Intravenous Every 24 hours 02/19/21 0052 02/24/21  2159   02/19/21 2200  azithromycin (ZITHROMAX) tablet 500 mg        500 mg Oral Daily at bedtime 02/19/21 0052 02/24/21 2159   02/18/21 2115  cefTRIAXone (ROCEPHIN) 2 g in sodium chloride 0.9 % 100 mL IVPB        2 g 200 mL/hr over 30 Minutes Intravenous  Once 02/18/21 2102 02/18/21 2300   02/18/21 2115  azithromycin (ZITHROMAX) 500 mg in sodium chloride 0.9 % 250 mL IVPB        500 mg 250 mL/hr over 60 Minutes Intravenous  Once 02/18/21  2102 02/19/21 0130      Subjective: Patient was seen and examined at bedside.  Overnight events noted.    Patient reports feeling better, but still has shortness of breath on exertion. Patient is alert but seems confused today, was trying to pull IV line, remove cardiac monitor. She is on 2 L of supplemental oxygen sats 94%.   Objective: Vitals:   02/21/21 2234 02/22/21 0500 02/22/21 0556 02/22/21 1211  BP: (!) 141/98  123/89 (!) 124/91  Pulse: 97  (!) 103 93  Resp: 20  20 (!) 22  Temp: 97.8 F (36.6 C)  97.6 F (36.4 C) 97.6 F (36.4 C)  TempSrc: Oral  Oral Axillary  SpO2: 94%  96% 97%  Weight:  67.8 kg    Height:        Intake/Output Summary (Last 24 hours) at 02/22/2021 1511 Last data filed at 02/22/2021 1120 Gross per 24 hour  Intake 320 ml  Output 600 ml  Net -280 ml   Filed Weights   02/20/21 0500 02/21/21 0635 02/22/21 0500  Weight: 67.7 kg 67.8 kg 67.8 kg    Examination:  General exam: Appears calm and comfortable, not in any acute distress. Respiratory system: Clear to auscultation. Respiratory effort normal. Cardiovascular system: S1 & S2 heard, RRR. No JVD, murmurs, rubs, gallops or clicks. No pedal edema. Gastrointestinal system: Abdomen is nondistended, soft and nontender. No organomegaly or masses felt. Normal bowel sounds heard. Central nervous system: Alert and confused,   No focal neurological deficits. Extremities: Symmetric 5 x 5 power.  No edema, no cyanosis, no clubbing. Skin: No rashes, lesions or ulcers Psychiatry: Judgement and insight appear normal. Mood & affect appropriate.     Data Reviewed: I have personally reviewed following labs and imaging studies  CBC: Recent Labs  Lab 02/18/21 1833 02/19/21 0522 02/20/21 0445 02/21/21 0457 02/22/21 0454  WBC 13.7* 10.7* 11.1* 11.0* 11.4*  NEUTROABS 11.3* 8.6*  --   --   --   HGB 12.6 12.5 12.8 11.8* 13.1  HCT 38.3 37.3 37.5 35.5* 39.9  MCV 100.3* 99.7 97.7 98.9 101.8*  PLT 221 183 210  194 854   Basic Metabolic Panel: Recent Labs  Lab 02/18/21 1833 02/18/21 2135 02/19/21 0522 02/20/21 0445 02/21/21 0457 02/22/21 0454  NA 137  --  137 136 136 135  K 4.0  --  3.7 3.7 3.1* 4.3  CL 102  --  105 104 104 103  CO2 24  --  20* 21* 22 22  GLUCOSE 189*  --  177* 159* 146* 154*  BUN 25*  --  23 22 20 21   CREATININE 0.89  --  0.82 0.76 0.75 0.86  CALCIUM 9.6  --  8.9 9.1 8.9 9.4  MG  --  2.1 2.0  --   --   --    GFR: Estimated Creatinine Clearance: 46.5 mL/min (by C-G formula based  on SCr of 0.86 mg/dL). Liver Function Tests: Recent Labs  Lab 02/18/21 1833 02/19/21 0522  AST 36 33  ALT 31 30  ALKPHOS 156* 142*  BILITOT 3.7* 3.0*  PROT 8.2* 6.9  ALBUMIN 3.7 3.2*   No results for input(s): LIPASE, AMYLASE in the last 168 hours. No results for input(s): AMMONIA in the last 168 hours. Coagulation Profile: Recent Labs  Lab 02/19/21 0053 02/19/21 0522 02/20/21 0445 02/21/21 0457 02/22/21 0454  INR 3.1* 3.4* 3.7* 3.6* 4.2*   Cardiac Enzymes: No results for input(s): CKTOTAL, CKMB, CKMBINDEX, TROPONINI in the last 168 hours. BNP (last 3 results) No results for input(s): PROBNP in the last 8760 hours. HbA1C: No results for input(s): HGBA1C in the last 72 hours. CBG: Recent Labs  Lab 02/20/21 0820 02/20/21 1656 02/21/21 0744 02/21/21 1703 02/22/21 0732  GLUCAP 200* 192* 149* 175* 151*   Lipid Profile: No results for input(s): CHOL, HDL, LDLCALC, TRIG, CHOLHDL, LDLDIRECT in the last 72 hours. Thyroid Function Tests: No results for input(s): TSH, T4TOTAL, FREET4, T3FREE, THYROIDAB in the last 72 hours. Anemia Panel: No results for input(s): VITAMINB12, FOLATE, FERRITIN, TIBC, IRON, RETICCTPCT in the last 72 hours. Sepsis Labs: Recent Labs  Lab 02/18/21 2135 02/19/21 0053 02/19/21 0534 02/21/21 1141  PROCALCITON  --   --   --  0.95  LATICACIDVEN 2.3* 2.9* 2.0*  --     Recent Results (from the past 240 hour(s))  Resp Panel by RT-PCR (Flu A&B,  Covid) Nasopharyngeal Swab     Status: None   Collection Time: 02/18/21  9:15 PM   Specimen: Nasopharyngeal Swab; Nasopharyngeal(NP) swabs in vial transport medium  Result Value Ref Range Status   SARS Coronavirus 2 by RT PCR NEGATIVE NEGATIVE Final    Comment: (NOTE) SARS-CoV-2 target nucleic acids are NOT DETECTED.  The SARS-CoV-2 RNA is generally detectable in upper respiratory specimens during the acute phase of infection. The lowest concentration of SARS-CoV-2 viral copies this assay can detect is 138 copies/mL. A negative result does not preclude SARS-Cov-2 infection and should not be used as the sole basis for treatment or other patient management decisions. A negative result may occur with  improper specimen collection/handling, submission of specimen other than nasopharyngeal swab, presence of viral mutation(s) within the areas targeted by this assay, and inadequate number of viral copies(<138 copies/mL). A negative result must be combined with clinical observations, patient history, and epidemiological information. The expected result is Negative.  Fact Sheet for Patients:  EntrepreneurPulse.com.au  Fact Sheet for Healthcare Providers:  IncredibleEmployment.be  This test is no t yet approved or cleared by the [place] FDA and  has been authorized for detection and/or diagnosis of SARS-CoV-2 by FDA under an Emergency Use Authorization (EUA). This EUA will remain  in effect (meaning this test can be used) for the duration of the COVID-19 declaration under Section 564(b)(1) of the Act, 21 U.S.C.section 360bbb-3(b)(1), unless the authorization is terminated  or revoked sooner.       Influenza A by PCR NEGATIVE NEGATIVE Final   Influenza B by PCR NEGATIVE NEGATIVE Final    Comment: (NOTE) The Xpert Xpress SARS-CoV-2/FLU/RSV plus assay is intended as an aid in the diagnosis of influenza from Nasopharyngeal swab specimens and should  not be used as a sole basis for treatment. Nasal washings and aspirates are unacceptable for Xpert Xpress SARS-CoV-2/FLU/RSV testing.  Fact Sheet for Patients: EntrepreneurPulse.com.au  Fact Sheet for Healthcare Providers: IncredibleEmployment.be  This test is not yet approved or cleared  by the [place] and has been authorized for detection and/or diagnosis of SARS-CoV-2 by FDA under an Emergency Use Authorization (EUA). This EUA will remain in effect (meaning this test can be used) for the duration of the COVID-19 declaration under Section 564(b)(1) of the Act, 21 U.S.C. section 360bbb-3(b)(1), unless the authorization is terminated or revoked.  Performed at [facility], [place] [address] [place], [place] [address]   Blood culture (routine x 2)     Status: None (Preliminary result)   Collection Time: 02/18/21  9:35 PM   Specimen: BLOOD RIGHT HAND  Result Value Ref Range Status   Specimen Description   Final    BLOOD RIGHT HAND Performed at [place] [address] [place], [place] [address]    Special Requests   Final    BOTTLES DRAWN AEROBIC AND ANAEROBIC Blood Culture results may not be optimal due to an inadequate volume of blood received in culture bottles Performed at [facility] [address] [place] [address] [place], [place] [address]    Culture   Final    NO GROWTH 3 DAYS Performed at [facility], [place] [address], [place], [place] [address]    Report Status PENDING  Incomplete  Blood culture (routine x 2)     Status: None (Preliminary result)   Collection Time: 02/18/21  9:35 PM   Specimen: BLOOD  Result Value Ref Range Status   Specimen Description   Final    BLOOD RIGHT ANTECUBITAL Performed at [place] [address] [employer] [employer], [place] [address]    Special Requests   Final    BOTTLES DRAWN AEROBIC AND ANAEROBIC Blood Culture results may  not be optimal due to an inadequate volume of blood received in culture bottles Performed at [place] [address] [place], [place] [address]    Culture   Final    NO GROWTH 3 DAYS Performed at [facility], [place] [address], [place],  [address]    Report Status PENDING  Incomplete    Radiology Studies: No results found. Scheduled Meds: . azithromycin  500 mg Oral QHS  . furosemide  40 mg Intravenous Daily  . gabapentin  400 mg Oral Q breakfast  . gabapentin  800 mg Oral QHS  . guaiFENesin  600 mg Oral BID  . lactobacillus  1 g Oral TID WC  . levothyroxine  25 mcg Oral Q0600  . metoprolol succinate  50 mg Oral BID  . sacubitril-valsartan  1 tablet Oral BID  . sodium chloride flush  3 mL Intravenous Q12H  . Warfarin - Pharmacist Dosing Inpatient   Does not apply q1600   Continuous Infusions: . sodium chloride 250 mL (02/21/21 2252)  . cefTRIAXone (ROCEPHIN)  IV 2 g (02/21/21 2254)     LOS: 3 days    Time spent: 25 mins    Broxton Broady, MD [facility]   If 7PM-7AM, please contact night-coverage

## 2021-02-22 NOTE — Progress Notes (Signed)
[place] for Coumadin Indication: atrial fibrillation  Allergies  Allergen Reactions  . Amiodarone Other (See Comments)    unknown  . Crestor [Rosuvastatin Calcium] Other (See Comments)    unknown  . Lipitor [Atorvastatin Calcium] Swelling    Patient Measurements: Height: 5\' 7"  (170.2 cm) Weight: 67.8 kg (149 lb 7.6 oz) IBW/kg (Calculated) : 61.6  Vital Signs: Temp: 97.6 F (36.4 C) (04/22 0556) Temp Source: Oral (04/22 0556) BP: 123/89 (04/22 0556) Pulse Rate: 103 (04/22 0556)  Labs: Recent Labs    02/20/21 0445 02/21/21 0457 02/22/21 0454  HGB 12.8 11.8* 13.1  HCT 37.5 35.5* 39.9  PLT 210 194 254  LABPROT 36.9* 35.8* 40.8*  INR 3.7* 3.6* 4.2*  CREATININE 0.76 0.75 0.86    Estimated Creatinine Clearance: 46.5 mL/min (by C-G formula based on SCr of 0.86 mg/dL).   Medications:  Warfarin PTA- 5mg  daily except 2.5mg  on Tues/Thur/Sat.  LD 4/18 @ 10a  Assessment: [name] with hx of afib on Coumadin.  INR was SUPRAtherapeutic on admission at 5.5.  Elevated INR likely due to acute illness & poor oral intake for past several days. Rocephin + Zithromax given in ED which can also interact to elevate INR.    Of note, patient has been on current outpatient regimen since Dec 2021 with mostly therapeutic INR.    02/22/21 8:07 AM  - INR remains elevated and stable from yesterday - Hgb now WNL, Plt stable WNL - Patient is on azithromycin which may potentiate warfarin - no bleeding reported  Goal of Therapy:  INR 2-3   Plan:  Continue to hold Coumadin for elevated INR Daily PT/INR Monitor closely for s/sx of bleeding  Napoleon Form  02/22/2021,8:07 AM

## 2021-02-22 NOTE — Plan of Care (Signed)
  Problem: Activity: Goal: Risk for activity intolerance will decrease Outcome: Progressing   Problem: Elimination: Goal: Will not experience complications related to urinary retention Outcome: Progressing   Problem: Pain Managment: Goal: General experience of comfort will improve Outcome: Progressing   Problem: Safety: Goal: Ability to remain free from injury will improve Outcome: Progressing   Problem: Skin Integrity: Goal: Risk for impaired skin integrity will decrease Outcome: Progressing   

## 2021-02-22 NOTE — Care Management Important Message (Signed)
Important Message  Patient Details IM Letter given to the Patient. Name: Natasha Livingston MRN: [id number] Date of Birth: 08/23/1934   Medicare Important Message Given:  Yes     Kerin Salen 02/22/2021, 11:06 AM

## 2021-02-23 ENCOUNTER — Inpatient Hospital Stay (HOSPITAL_COMMUNITY): Payer: Medicare Other

## 2021-02-23 LAB — PHOSPHORUS: Phosphorus: 2.8 mg/dL (ref 2.5–4.6)

## 2021-02-23 LAB — MAGNESIUM: Magnesium: 2.2 mg/dL (ref 1.7–2.4)

## 2021-02-23 LAB — CBC
HCT: 44 % (ref 36.0–46.0)
Hemoglobin: 14.8 g/dL (ref 12.0–15.0)
MCH: 33.1 pg (ref 26.0–34.0)
MCHC: 33.6 g/dL (ref 30.0–36.0)
MCV: 98.4 fL (ref 80.0–100.0)
Platelets: 192 10*3/uL (ref 150–400)
RBC: 4.47 MIL/uL (ref 3.87–5.11)
RDW: 15.8 % — ABNORMAL HIGH (ref 11.5–15.5)
WBC: 8.5 10*3/uL (ref 4.0–10.5)
nRBC: 0.4 % — ABNORMAL HIGH (ref 0.0–0.2)

## 2021-02-23 LAB — GLUCOSE, CAPILLARY
Glucose-Capillary: 192 mg/dL — ABNORMAL HIGH (ref 70–99)
Glucose-Capillary: 181 mg/dL — ABNORMAL HIGH (ref 70–99)

## 2021-02-23 LAB — BASIC METABOLIC PANEL
Anion gap: 11 (ref 5–15)
BUN: 22 mg/dL (ref 8–23)
CO2: 24 mmol/L (ref 22–32)
Calcium: 9 mg/dL (ref 8.9–10.3)
Chloride: 100 mmol/L (ref 98–111)
Creatinine, Ser: 0.84 mg/dL (ref 0.44–1.00)
GFR, Estimated: >60 mL/min (ref >60)
Glucose, Bld: 104 mg/dL — ABNORMAL HIGH (ref 70–99)
Potassium: 4.1 mmol/L (ref 3.5–5.1)
Sodium: 135 mmol/L (ref 135–145)

## 2021-02-23 LAB — PROTIME-INR
INR: 2.4 — ABNORMAL HIGH (ref 0.8–1.2)
Prothrombin Time: 26.1 seconds — ABNORMAL HIGH (ref 11.4–15.2)

## 2021-02-23 LAB — BRAIN NATRIURETIC PEPTIDE: B Natriuretic Peptide: 315.4 pg/mL — ABNORMAL HIGH (ref 0.0–100.0)

## 2021-02-23 MED ORDER — WARFARIN SODIUM 2.5 MG PO TABS
2.5000 mg | ORAL_TABLET | Freq: Once | ORAL | Status: AC
  Administered 2021-02-23: 2.5 mg via ORAL
  Filled 2021-02-23: qty 1

## 2021-02-23 NOTE — Progress Notes (Signed)
[name] for Coumadin Indication: atrial fibrillation  Allergies  Allergen Reactions  . Amiodarone Other (See Comments)    unknown  . Crestor [Rosuvastatin Calcium] Other (See Comments)    unknown  . Lipitor [Atorvastatin Calcium] Swelling    Patient Measurements: Height: 5\' 7"  (170.2 cm) Weight: 68.1 kg (150 lb 2.1 oz) IBW/kg (Calculated) : 61.6  Vital Signs: Temp: 97.7 F (36.5 C) (04/23 0432) Temp Source: Oral (04/23 0432) BP: 112/82 (04/23 0432) Pulse Rate: 87 (04/23 0432)  Labs: Recent Labs    02/21/21 0457 02/22/21 0454 02/23/21 0448  HGB 11.8* 13.1 14.8  HCT 35.5* 39.9 44.0  PLT 194 254 192  LABPROT 35.8* 40.8* 26.1*  INR 3.6* 4.2* 2.4*  CREATININE 0.75 0.86 0.84    Estimated Creatinine Clearance: 47.6 mL/min (by C-G formula based on SCr of 0.84 mg/dL).   Medications:  Warfarin PTA- 5mg  daily except 2.5mg  on Tues/Thur/Sat.  LD 4/18 @ 10a  Assessment: [name] with hx of afib on Coumadin.  INR was SUPRAtherapeutic on admission at 5.5.  Elevated INR likely due to acute illness & poor oral intake for past several days. Rocephin + Zithromax given in ED which can also interact to elevate INR.    Of note, patient has been on current outpatient regimen since Dec 2021 with mostly therapeutic INR.    02/23/21 10:51 AM  - INR finally down and now in therapeutic range though las dose was 4/18 and patient remains on azithromycin  - Hgb now WNL, Plt stable WNL - Patient is on azithromycin which may potentiate warfarin - no bleeding reported  Goal of Therapy:  INR 2-3   Plan:  Warfarin 2.5 mg today Daily PT/INR Monitor closely for s/sx of bleeding  [name]  02/23/2021,10:51 AM

## 2021-02-23 NOTE — Progress Notes (Signed)
PROGRESS NOTE    Natasha Livingston  YBO:[id number] DOB: 09/02/1934 DOA: 02/18/2021 PCP: Cyndi Bender, PA-C    Brief Narrative: This 87 years old female with history of atrial fibrillation on Coumadin, hypertension, chronic systolic heart failure with last known EF in 2019 of 35%, Neuropathy presented in the ED with c/o: cough and shortness of breath for 3 days.  Patient reports coughing up yellow phlegm in the ED,  chest x-ray showed possible right upper lobe infiltrate.  Patient is diagnosed with pneumonia and started on IV antibiotics including Rocephin and Zithromax.  Assessment & Plan:   Principal Problem:   Right upper lobe pneumonia Active Problems:   Hypertension   Neuropathy   Long term (current) use of anticoagulants - on coumadin for afib   Paroxysmal atrial fibrillation (HCC)   Chronic combined systolic and diastolic CHF (congestive heart failure) ([place]) - echo 02-2018 LVEF 35-40%   Hyperglycemia   CAP (community acquired pneumonia)   Community-acquired pneumonia; Patient presented with cough and shortness of breath.   CT chest without contrast shows right upper and lower lobe peribronchovascular groundglass and consolidative airspace opacities suggestive of infection/inflammation.  Patient started on Rocephin and Zithromax for 5 days. Follow-up chest x-ray in 3 to 4 weeks,  recommended to ensure complete resolution of findings.  Procalcitonin 0.95,  Blood cultures are negative to date. Repeat CXR showed mild progression of RUL airspace disease.  Paroxysmal atrial fibrillation: Heart rate is well controlled, continue metoprolol 50 mg p.o. twice daily,  Continue monitoring on telemetry.   Pharmacy to manage dosing of warfarin. INR remained supra therapeutic,  Coumadin was on hold. INR finally down to 2.4  Chronic systolic/diastolic heart failure: Last echocardiogram from 2019 showed EF of 35 to 40%.   BNP 315.4 Does not appear to be in fluid overload at this time.   Currently euvolemic.  Lasix resumed today since patient has acute shortness of breath last night. Continue Entresto, resume lasix.  Hypertension: blood pressure stable, continue metoprolol, sacubitril/valsartan.  Hypothyroidism: continue Synthroid  Peripheral neuropathy: Continue Neurontin.  Hyperglycemia: hemoglobin A1c has been ordered.   She denies any Hx. of diabetes.    DVT prophylaxis: Coumadin Code Status: Full code Family Communication: Spoke with son Disposition Plan:   Anti-infectives (From admission, onward)   Start     Dose/Rate Route Frequency Ordered Stop   02/19/21 2200  cefTRIAXone (ROCEPHIN) 2 g in sodium chloride 0.9 % 100 mL IVPB        2 g 200 mL/hr over 30 Minutes Intravenous Every 24 hours 02/19/21 0052 02/24/21 2159   02/19/21 2200  azithromycin (ZITHROMAX) tablet 500 mg        500 mg Oral Daily at bedtime 02/19/21 0052 02/24/21 2159   02/18/21 2115  cefTRIAXone (ROCEPHIN) 2 g in sodium chloride 0.9 % 100 mL IVPB        2 g 200 mL/hr over 30 Minutes Intravenous  Once 02/18/21 2102 02/18/21 2300   02/18/21 2115  azithromycin (ZITHROMAX) 500 mg in sodium chloride 0.9 % 250 mL IVPB        500 mg 250 mL/hr over 60 Minutes Intravenous  Once 02/18/21 2102 02/19/21 0130     Consultants:    None.  Procedures:  None. Antimicrobials:  Anti-infectives (From admission, onward)   Start     Dose/Rate Route Frequency Ordered Stop   02/19/21 2200  cefTRIAXone (ROCEPHIN) 2 g in sodium chloride 0.9 % 100 mL IVPB  2 g 200 mL/hr over 30 Minutes Intravenous Every 24 hours 02/19/21 0052 02/24/21 2159   02/19/21 2200  azithromycin (ZITHROMAX) tablet 500 mg        500 mg Oral Daily at bedtime 02/19/21 0052 02/24/21 2159   02/18/21 2115  cefTRIAXone (ROCEPHIN) 2 g in sodium chloride 0.9 % 100 mL IVPB        2 g 200 mL/hr over 30 Minutes Intravenous  Once 02/18/21 2102 02/18/21 2300   02/18/21 2115  azithromycin (ZITHROMAX) 500 mg in sodium chloride 0.9 % 250  mL IVPB        500 mg 250 mL/hr over 60 Minutes Intravenous  Once 02/18/21 2102 02/19/21 0130      Subjective: Patient was seen and examined at bedside.  Overnight events noted.    Patient reports feeling better, sitting comfortably on bed, having breakfast..  She is weaned down to room air, sat 94%,    Objective: Vitals:   02/22/21 2125 02/23/21 0432 02/23/21 0500 02/23/21 1253  BP: 114/85 112/82  112/75  Pulse:  87  90  Resp:  18  17  Temp:  97.7 F (36.5 C)  98.1 F (36.7 C)  TempSrc:  Oral  Oral  SpO2:  93%  95%  Weight:   68.1 kg   Height:        Intake/Output Summary (Last 24 hours) at 02/23/2021 1347 Last data filed at 02/23/2021 1254 Gross per 24 hour  Intake 981.41 ml  Output 950 ml  Net 31.41 ml   Filed Weights   02/21/21 0635 02/22/21 0500 02/23/21 0500  Weight: 67.8 kg 67.8 kg 68.1 kg    Examination:  General exam: Appears calm and comfortable, not in any acute distress. Respiratory system: Clear to auscultation. Respiratory effort normal. Cardiovascular system: S1 & S2 heard, RRR. No JVD, murmurs, rubs, gallops or clicks. No pedal edema. Gastrointestinal system: Abdomen is nondistended, soft and nontender. No organomegaly or masses felt. Normal bowel sounds heard. Central nervous system: Alert and confused,   No focal neurological deficits. Extremities: Symmetric 5 x 5 power.  No edema, no cyanosis, no clubbing. Skin: No rashes, lesions or ulcers Psychiatry: Judgement and insight appear normal. Mood & affect appropriate.     Data Reviewed: I have personally reviewed following labs and imaging studies  CBC: Recent Labs  Lab 02/18/21 1833 02/19/21 0522 02/20/21 0445 02/21/21 0457 02/22/21 0454 02/23/21 0448  WBC 13.7* 10.7* 11.1* 11.0* 11.4* 8.5  NEUTROABS 11.3* 8.6*  --   --   --   --   HGB 12.6 12.5 12.8 11.8* 13.1 14.8  HCT 38.3 37.3 37.5 35.5* 39.9 44.0  MCV 100.3* 99.7 97.7 98.9 101.8* 98.4  PLT 221 183 210 194 254 409   Basic  Metabolic Panel: Recent Labs  Lab 02/18/21 2135 02/19/21 0522 02/20/21 0445 02/21/21 0457 02/22/21 0454 02/23/21 0448  NA  --  137 136 136 135 135  K  --  3.7 3.7 3.1* 4.3 4.1  CL  --  105 104 104 103 100  CO2  --  20* 21* 22 22 24   GLUCOSE  --  177* 159* 146* 154* 104*  BUN  --  23 22 20 21 22   CREATININE  --  0.82 0.76 0.75 0.86 0.84  CALCIUM  --  8.9 9.1 8.9 9.4 9.0  MG 2.1 2.0  --   --   --  2.2  PHOS  --   --   --   --   --  2.8   GFR: Estimated Creatinine Clearance: 47.6 mL/min (by C-G formula based on SCr of 0.84 mg/dL). Liver Function Tests: Recent Labs  Lab 02/18/21 1833 02/19/21 0522  AST 36 33  ALT 31 30  ALKPHOS 156* 142*  BILITOT 3.7* 3.0*  PROT 8.2* 6.9  ALBUMIN 3.7 3.2*   No results for input(s): LIPASE, AMYLASE in the last 168 hours. No results for input(s): AMMONIA in the last 168 hours. Coagulation Profile: Recent Labs  Lab 02/19/21 0522 02/20/21 0445 02/21/21 0457 02/22/21 0454 02/23/21 0448  INR 3.4* 3.7* 3.6* 4.2* 2.4*   Cardiac Enzymes: No results for input(s): CKTOTAL, CKMB, CKMBINDEX, TROPONINI in the last 168 hours. BNP (last 3 results) No results for input(s): PROBNP in the last 8760 hours. HbA1C: No results for input(s): HGBA1C in the last 72 hours. CBG: Recent Labs  Lab 02/21/21 0744 02/21/21 1703 02/22/21 0732 02/22/21 1729 02/23/21 0736  GLUCAP 149* 175* 151* 131* 192*   Lipid Profile: No results for input(s): CHOL, HDL, LDLCALC, TRIG, CHOLHDL, LDLDIRECT in the last 72 hours. Thyroid Function Tests: No results for input(s): TSH, T4TOTAL, FREET4, T3FREE, THYROIDAB in the last 72 hours. Anemia Panel: No results for input(s): VITAMINB12, FOLATE, FERRITIN, TIBC, IRON, RETICCTPCT in the last 72 hours. Sepsis Labs: Recent Labs  Lab 02/18/21 2135 02/19/21 0053 02/19/21 0534 02/21/21 1141  PROCALCITON  --   --   --  0.95  LATICACIDVEN 2.3* 2.9* 2.0*  --     Recent Results (from the past 240 hour(s))  Resp Panel by  RT-PCR (Flu A&B, Covid) Nasopharyngeal Swab     Status: None   Collection Time: 02/18/21  9:15 PM   Specimen: Nasopharyngeal Swab; Nasopharyngeal(NP) swabs in vial transport medium  Result Value Ref Range Status   SARS Coronavirus 2 by RT PCR NEGATIVE NEGATIVE Final    Comment: (NOTE) SARS-CoV-2 target nucleic acids are NOT DETECTED.  The SARS-CoV-2 RNA is generally detectable in upper respiratory specimens during the acute phase of infection. The lowest concentration of SARS-CoV-2 viral copies this assay can detect is 138 copies/mL. A negative result does not preclude SARS-Cov-2 infection and should not be used as the sole basis for treatment or other patient management decisions. A negative result may occur with  improper specimen collection/handling, submission of specimen other than nasopharyngeal swab, presence of viral mutation(s) within the areas targeted by this assay, and inadequate number of viral copies(<138 copies/mL). A negative result must be combined with clinical observations, patient history, and epidemiological information. The expected result is Negative.  Fact Sheet for Patients:  EntrepreneurPulse.com.au  Fact Sheet for Healthcare Providers:  IncredibleEmployment.be  This test is no t yet approved or cleared by the [place] FDA and  has been authorized for detection and/or diagnosis of SARS-CoV-2 by FDA under an Emergency Use Authorization (EUA). This EUA will remain  in effect (meaning this test can be used) for the duration of the COVID-19 declaration under Section 564(b)(1) of the Act, 21 U.S.C.section 360bbb-3(b)(1), unless the authorization is terminated  or revoked sooner.       Influenza A by PCR NEGATIVE NEGATIVE Final   Influenza B by PCR NEGATIVE NEGATIVE Final    Comment: (NOTE) The Xpert Xpress SARS-CoV-2/FLU/RSV plus assay is intended as an aid in the diagnosis of influenza from Nasopharyngeal swab  specimens and should not be used as a sole basis for treatment. Nasal washings and aspirates are unacceptable for Xpert Xpress SARS-CoV-2/FLU/RSV testing.  Fact Sheet for Patients: EntrepreneurPulse.com.au  Fact Sheet  for Healthcare Providers: IncredibleEmployment.be  This test is not yet approved or cleared by the [place] and has been authorized for detection and/or diagnosis of SARS-CoV-2 by FDA under an Emergency Use Authorization (EUA). This EUA will remain in effect (meaning this test can be used) for the duration of the COVID-19 declaration under Section 564(b)(1) of the Act, 21 U.S.C. section 360bbb-3(b)(1), unless the authorization is terminated or revoked.  Performed at [facility], [place] [address] [place], [place] [address]   Blood culture (routine x 2)     Status: None (Preliminary result)   Collection Time: 02/18/21  9:35 PM   Specimen: BLOOD RIGHT HAND  Result Value Ref Range Status   Specimen Description   Final    BLOOD RIGHT HAND Performed at [facility] [place] [address], [place], [place] [address]    Special Requests   Final    BOTTLES DRAWN AEROBIC AND ANAEROBIC Blood Culture results may not be optimal due to an inadequate volume of blood received in culture bottles Performed at [facility] [address], [place], [place] [address]    Culture   Final    NO GROWTH 4 DAYS Performed at [facility], [place] [address] [place], [place] [address]    Report Status PENDING  Incomplete  Blood culture (routine x 2)     Status: None (Preliminary result)   Collection Time: 02/18/21  9:35 PM   Specimen: BLOOD  Result Value Ref Range Status   Specimen Description   Final    BLOOD RIGHT ANTECUBITAL Performed at [facility] [address] [place], [place] [address]    Special Requests   Final    BOTTLES DRAWN AEROBIC AND ANAEROBIC Blood  Culture results may not be optimal due to an inadequate volume of blood received in culture bottles Performed at [facility] [address] [place], [place] [address]    Culture   Final    NO GROWTH 4 DAYS Performed at [facility], [place] [address], [place],  [address]    Report Status PENDING  Incomplete    Radiology Studies: DG CHEST PORT 1 VIEW  Result Date: 02/23/2021 CLINICAL DATA:  Follow-up pneumonia EXAM: PORTABLE CHEST 1 VIEW COMPARISON:  02/18/2021 FINDINGS: Previous median sternotomy and CABG procedure. No pleural effusion or edema. Mild progression of right upper lobe airspace disease. No new findings. IMPRESSION: Mild progression of right upper lobe airspace disease. Electronically Signed   By: Kerby Moors M.D.   On: 02/23/2021 07:42   Scheduled Meds: . azithromycin  500 mg Oral QHS  . furosemide  40 mg Intravenous Daily  . gabapentin  400 mg Oral Q breakfast  . gabapentin  800 mg Oral QHS  . guaiFENesin  600 mg Oral BID  . lactobacillus  1 g Oral TID WC  . levothyroxine  25 mcg Oral Q0600  . metoprolol succinate  50 mg Oral BID  . sacubitril-valsartan  1 tablet Oral BID  . sodium chloride flush  3 mL Intravenous Q12H  . warfarin  2.5 mg Oral ONCE-1600  . Warfarin - Pharmacist Dosing Inpatient   Does not apply q1600   Continuous Infusions: . sodium chloride 10 mL/hr at 02/22/21 2135  . cefTRIAXone (ROCEPHIN)  IV 2 g (02/22/21 2137)     LOS: 4 days    Time spent: 25 mins    Shawna Clamp, MD [facility]   If 7PM-7AM,  please contact night-coverage

## 2021-02-23 NOTE — Plan of Care (Signed)

## 2021-02-24 ENCOUNTER — Inpatient Hospital Stay (HOSPITAL_COMMUNITY): Payer: Medicare Other

## 2021-02-24 LAB — CBC
HCT: 39.6 % (ref 36.0–46.0)
Hemoglobin: 13 g/dL (ref 12.0–15.0)
MCH: 33 pg (ref 26.0–34.0)
MCHC: 32.8 g/dL (ref 30.0–36.0)
MCV: 100.5 fL — ABNORMAL HIGH (ref 80.0–100.0)
Platelets: 233 10*3/uL (ref 150–400)
RBC: 3.94 MIL/uL (ref 3.87–5.11)
RDW: 15.5 % (ref 11.5–15.5)
WBC: 9.7 10*3/uL (ref 4.0–10.5)
nRBC: 0.2 % (ref 0.0–0.2)

## 2021-02-24 LAB — CULTURE, BLOOD (ROUTINE X 2)
Culture: NO GROWTH
Culture: NO GROWTH

## 2021-02-24 LAB — PROTIME-INR
INR: 1.5 — ABNORMAL HIGH (ref 0.8–1.2)
Prothrombin Time: 18.3 seconds — ABNORMAL HIGH (ref 11.4–15.2)

## 2021-02-24 LAB — HEMOGLOBIN A1C
Hgb A1c MFr Bld: 7.5 % — ABNORMAL HIGH (ref 4.8–5.6)
Mean Plasma Glucose: 168.55 mg/dL

## 2021-02-24 LAB — GLUCOSE, CAPILLARY
Glucose-Capillary: 133 mg/dL — ABNORMAL HIGH (ref 70–99)
Glucose-Capillary: 134 mg/dL — ABNORMAL HIGH (ref 70–99)
Glucose-Capillary: 141 mg/dL — ABNORMAL HIGH (ref 70–99)

## 2021-02-24 LAB — PROCALCITONIN: Procalcitonin: 0.24 ng/mL

## 2021-02-24 MED ORDER — WARFARIN SODIUM 5 MG PO TABS
5.0000 mg | ORAL_TABLET | Freq: Once | ORAL | Status: AC
  Administered 2021-02-24: 5 mg via ORAL
  Filled 2021-02-24: qty 1

## 2021-02-24 MED ORDER — INSULIN ASPART 100 UNIT/ML ~~LOC~~ SOLN: 0.0000 [IU] | Freq: Three times a day (TID) | SUBCUTANEOUS | Status: DC

## 2021-02-24 NOTE — Progress Notes (Signed)
[facility] for Coumadin Indication: atrial fibrillation  Allergies  Allergen Reactions  . Amiodarone Other (See Comments)    unknown  . Crestor [Rosuvastatin Calcium] Other (See Comments)    unknown  . Lipitor [Atorvastatin Calcium] Swelling    Patient Measurements: Height: 5\' 7"  (170.2 cm) Weight: 67.6 kg (149 lb 0.5 oz) IBW/kg (Calculated) : 61.6  Vital Signs: Temp: 97.7 F (36.5 C) (04/24 0552) BP: 125/80 (04/24 0908) Pulse Rate: 98 (04/24 0908)  Labs: Recent Labs    02/22/21 0454 02/23/21 0448 02/24/21 0717  HGB 13.1 14.8 13.0  HCT 39.9 44.0 39.6  PLT 254 192 233  LABPROT 40.8* 26.1* 18.3*  INR 4.2* 2.4* 1.5*  CREATININE 0.86 0.84  --     Estimated Creatinine Clearance: 47.6 mL/min (by C-G formula based on SCr of 0.84 mg/dL).   Medications:  Warfarin PTA- 5mg  daily except 2.5mg  on Tues/Thur/Sat.  LD 4/18 @ 10a  Assessment: [name] with hx of afib on Coumadin.  INR was SUPRAtherapeutic on admission at 5.5.  Elevated INR likely due to acute illness & poor oral intake for past several days. Rocephin + Zithromax given in ED which can also interact to elevate INR.    Of note, patient has been on current outpatient regimen since Dec 2021 with mostly therapeutic INR.    02/24/21 10:44 AM  - INR now sub-therapeutic though last dose was 4/18  - completed course of azithromcin - Hgb now WNL, Plt stable WNL - no bleeding reported  Goal of Therapy:  INR 2-3   Plan:  Warfarin 5 mg today Daily PT/INR Monitor closely for s/sx of bleeding  Napoleon Form  02/24/2021,10:44 AM

## 2021-02-24 NOTE — Progress Notes (Signed)
Physical Therapy Treatment Patient Details Name: Natasha Livingston MRN: [id number] DOB: June 10, 1934 Today's Date: 02/24/2021    History of Present Illness This 87 years old female with history of atrial fibrillation on Coumadin, hypertension, chronic systolic heart failure with last known EF in 2019 of 35%, Neuropathy presented in the ED 02/18/21 with c/o: cough and shortness of breath ,chest x-ray showed possible right upper lobe infiltrate, diagnosed with pneumonia    PT Comments    Progressing with mobility. Pt fatigues fairly easily. Min A for ambulation on today. O2>90% on RA. Dyspnea 2-3 /4 during session.  No family present during session. Will continue to follow and progress activity as tolerated.   Follow Up Recommendations  Home health PT;Supervision/Assistance - 24 hour     Equipment Recommendations   (RW if pt doesn't have one already at home)    Recommendations for Other Services       Precautions / Restrictions Precautions Precautions: Fall Restrictions Weight Bearing Restrictions: No    Mobility  Bed Mobility               General bed mobility comments: oob in recliner    Transfers Overall transfer level: Needs assistance Equipment used: Rolling walker (2 wheeled) Transfers: Sit to/from [facility] Sit to Stand: Min guard Stand pivot transfers: Min guard       General transfer comment: Min guard for safety. Increased time. Cues required.  Ambulation/Gait Ambulation/Gait assistance: Min assist Gait Distance (Feet): 75 Feet Assistive device: Rolling walker (2 wheeled) Gait Pattern/deviations: Step-through pattern;Decreased stride length;Trunk flexed     General Gait Details: A to steady throughout distance. Pt became fatigued after ~50 feet. Dyspnea 2/4 with cues for pursed lip/deep breathing. O2 >90% on RA   Stairs             Wheelchair Mobility    Modified Rankin (Stroke Patients Only)       Balance Overall  balance assessment: Needs assistance         Standing balance support: Bilateral upper extremity supported Standing balance-Leahy Scale: Fair                              Cognition Arousal/Alertness: Awake/alert Behavior During Therapy: WFL for tasks assessed/performed Overall Cognitive Status: No family/caregiver present to determine baseline cognitive functioning Area of Impairment: Orientation;Memory;Following commands;Awareness                 Orientation Level: Disoriented to;Place;Time;Situation   Memory: Decreased short-term memory Following Commands: Follows one step commands with increased time       General Comments: oriented to hospital with cues, frequent cues to initiate activity. pleasantly confused      Exercises      General Comments        Pertinent Vitals/Pain Pain Assessment: No/denies pain    Home Living                      Prior Function            PT Goals (current goals can now be found in the care plan section) Progress towards PT goals: Progressing toward goals    Frequency    Min 3X/week      PT Plan Current plan remains appropriate    Co-evaluation              AM-PAC PT "6 Clicks" Mobility   Outcome Measure  Help  needed turning from your back to your side while in a flat bed without using bedrails?: A Little Help needed moving from lying on your back to sitting on the side of a flat bed without using bedrails?: A Little Help needed moving to and from a bed to a chair (including a wheelchair)?: A Little Help needed standing up from a chair using your arms (e.g., wheelchair or bedside chair)?: A Little Help needed to walk in hospital room?: A Little Help needed climbing 3-5 steps with a railing? : A Lot 6 Click Score: 17    End of Session Equipment Utilized During Treatment: Gait belt Activity Tolerance: Patient limited by fatigue Patient left: in chair;with call bell/phone within  reach;with chair alarm set   PT Visit Diagnosis: Difficulty in walking, not elsewhere classified (R26.2);Unsteadiness on feet (R26.81)     Time: 4098-1191 PT Time Calculation (min) (ACUTE ONLY): 12 min  Charges:  $Gait Training: 8-22 mins                         Doreatha Massed, PT Acute Rehabilitation  Office: [phone] Pager: [phone]

## 2021-02-24 NOTE — Progress Notes (Signed)
PROGRESS NOTE    Natasha Livingston  [id number] DOB: [birth year] DOA: 02/18/2021 PCP: Cyndi Bender, PA-C    Brief Narrative: This 87 years old female with history of atrial fibrillation on Coumadin, hypertension, chronic systolic heart failure with last known EF in 2019 of 35%, Neuropathy presented in the ED with c/o: cough and shortness of breath for 3 days.  Patient reports coughing up yellow phlegm in the ED,  chest x-ray showed possible right upper lobe infiltrate.  Patient is diagnosed with pneumonia and started on IV antibiotics( Rocephin and Zithromax)   Assessment & Plan:   Principal Problem:   Right upper lobe pneumonia Active Problems:   Hypertension   Neuropathy   Long term (current) use of anticoagulants - on coumadin for afib   Paroxysmal atrial fibrillation (HCC)   Chronic combined systolic and diastolic CHF (congestive heart failure) ([place]) - echo 02-2018 LVEF 35-40%   Hyperglycemia   CAP (community acquired pneumonia)   Community-acquired pneumonia; Patient presented with cough and shortness of breath.   CT chest without contrast shows right upper and lower lobe peribronchovascular groundglass and consolidative airspace opacities suggestive of infection/inflammation.  Patient started on Rocephin and Zithromax for 5 days. Follow-up chest x-ray in 3 to 4 weeks,  recommended to ensure complete resolution of findings.  Procalcitonin 0.95 > 0.25,  Blood cultures are negative to date. Repeat CXR showed persistent pneumonia but improving. Last day of antibiotics 02/24/21.  Paroxysmal atrial fibrillation: Heart rate is well controlled, continue metoprolol 50 mg p.o. twice daily,  Continue monitoring on telemetry.   Pharmacy to manage dosing of warfarin. INR remained supra therapeutic,  Coumadin was on hold. INR finally down to 2.4> 1.5 Resumed coumadin 0000000  Chronic systolic/diastolic heart failure: Last echocardiogram from 2019 showed EF of 35 to 40%.   BNP  315.4 Does not appear to be in fluid overload at this time.  Currently euvolemic.  Lasix resumed  as patient has shortness of breath last night.  Continue Entresto, resume lasix.  Hypertension: blood pressure stable, continue metoprolol, sacubitril/valsartan.  Hypothyroidism: continue Synthroid  Peripheral neuropathy: Continue Neurontin.  Hyperglycemia:> DM II Hb A1c 7.4 Patient denies hx. of diabetes, Diabetic coordinater consult. Start sliding scale insulin.   DVT prophylaxis: Coumadin Code Status: Full code Family Communication: Spoke with son Disposition Plan:   Status is: Inpatient  Remains inpatient appropriate because:Inpatient level of care appropriate due to severity of illness   Dispo: The patient is from: Home              Anticipated d/c is to: Home  on 4/25.              Patient currently is not medically stable to d/c.   Difficult to place patient No   Consultants:    None.  Procedures:  None. Antimicrobials:  Anti-infectives (From admission, onward)   Start     Dose/Rate Route Frequency Ordered Stop   02/19/21 2200  cefTRIAXone (ROCEPHIN) 2 g in sodium chloride 0.9 % 100 mL IVPB        2 g 200 mL/hr over 30 Minutes Intravenous Every 24 hours 02/19/21 0052 02/23/21 2207   02/19/21 2200  azithromycin (ZITHROMAX) tablet 500 mg        500 mg Oral Daily at bedtime 02/19/21 0052 02/23/21 2138   02/18/21 2115  cefTRIAXone (ROCEPHIN) 2 g in sodium chloride 0.9 % 100 mL IVPB        2 g 200 mL/hr over 30 Minutes  Intravenous  Once 02/18/21 2102 02/18/21 2300   02/18/21 2115  azithromycin (ZITHROMAX) 500 mg in sodium chloride 0.9 % 250 mL IVPB        500 mg 250 mL/hr over 60 Minutes Intravenous  Once 02/18/21 2102 02/19/21 0130      Subjective: Patient was seen and examined at bedside.  Overnight events noted.    Patient reports feeling better, sitting comfortably on bed, having breakfast..  She is weaned down to room air, sat 94%, denies any shortness  of breath, states cough is much better. Encouraged to sit in the chair more frequently.   Objective: Vitals:   02/24/21 0552 02/24/21 0908 02/24/21 1235 02/24/21 1236  BP: 128/78 125/80 124/87   Pulse: 84 98 86 99  Resp: 20  17   Temp: 97.7 F (36.5 C)  97.9 F (36.6 C)   TempSrc:   Oral   SpO2: 95%  90% 97%  Weight:      Height:        Intake/Output Summary (Last 24 hours) at 02/24/2021 1501 Last data filed at 02/24/2021 1130 Gross per 24 hour  Intake 120 ml  Output 950 ml  Net -830 ml   Filed Weights   02/22/21 0500 02/23/21 0500 02/24/21 0500  Weight: 67.8 kg 68.1 kg 67.6 kg    Examination:  General exam: Appears calm and comfortable, not in any acute distress. Respiratory system: Clear to auscultation. Respiratory effort normal. Cardiovascular system: S1 & S2 heard, RRR. No JVD, murmurs, rubs, gallops or clicks. No pedal edema. Gastrointestinal system: Abdomen is nondistended, soft and nontender. No organomegaly or masses felt.  Normal bowel sounds heard. Central nervous system: Alert and confused,   No focal neurological deficits. Extremities: Symmetric 5 x 5 power.  No edema, no cyanosis, no clubbing. Skin: No rashes, lesions or ulcers Psychiatry: Judgement and insight appear normal. Mood & affect appropriate.     Data Reviewed: I have personally reviewed following labs and imaging studies  CBC: Recent Labs  Lab 02/18/21 1833 02/19/21 0522 02/20/21 0445 02/21/21 0457 02/22/21 0454 02/23/21 0448 02/24/21 0717  WBC 13.7* 10.7* 11.1* 11.0* 11.4* 8.5 9.7  NEUTROABS 11.3* 8.6*  --   --   --   --   --   HGB 12.6 12.5 12.8 11.8* 13.1 14.8 13.0  HCT 38.3 37.3 37.5 35.5* 39.9 44.0 39.6  MCV 100.3* 99.7 97.7 98.9 101.8* 98.4 100.5*  PLT 221 183 210 194 254 192 102   Basic Metabolic Panel: Recent Labs  Lab 02/18/21 2135 02/19/21 0522 02/20/21 0445 02/21/21 0457 02/22/21 0454 02/23/21 0448  NA  --  137 136 136 135 135  K  --  3.7 3.7 3.1* 4.3 4.1  CL   --  105 104 104 103 100  CO2  --  20* 21* 22 22 24   GLUCOSE  --  177* 159* 146* 154* 104*  BUN  --  23 22 20 21 22   CREATININE  --  0.82 0.76 0.75 0.86 0.84  CALCIUM  --  8.9 9.1 8.9 9.4 9.0  MG 2.1 2.0  --   --   --  2.2  PHOS  --   --   --   --   --  2.8   GFR: Estimated Creatinine Clearance: 47.6 mL/min (by C-G formula based on SCr of 0.84 mg/dL). Liver Function Tests: Recent Labs  Lab 02/18/21 1833 02/19/21 0522  AST 36 33  ALT 31 30  ALKPHOS 156* 142*  BILITOT  3.7* 3.0*  PROT 8.2* 6.9  ALBUMIN 3.7 3.2*   No results for input(s): LIPASE, AMYLASE in the last 168 hours. No results for input(s): AMMONIA in the last 168 hours. Coagulation Profile: Recent Labs  Lab 02/20/21 0445 02/21/21 0457 02/22/21 0454 02/23/21 0448 02/24/21 0717  INR 3.7* 3.6* 4.2* 2.4* 1.5*   Cardiac Enzymes: No results for input(s): CKTOTAL, CKMB, CKMBINDEX, TROPONINI in the last 168 hours. BNP (last 3 results) No results for input(s): PROBNP in the last 8760 hours. HbA1C: Recent Labs    02/24/21 0717  HGBA1C 7.5*   CBG: Recent Labs  Lab 02/22/21 0732 02/22/21 1729 02/23/21 0736 02/23/21 1621 02/24/21 0801  GLUCAP 151* 131* 192* 181* 133*   Lipid Profile: No results for input(s): CHOL, HDL, LDLCALC, TRIG, CHOLHDL, LDLDIRECT in the last 72 hours. Thyroid Function Tests: No results for input(s): TSH, T4TOTAL, FREET4, T3FREE, THYROIDAB in the last 72 hours. Anemia Panel: No results for input(s): VITAMINB12, FOLATE, FERRITIN, TIBC, IRON, RETICCTPCT in the last 72 hours. Sepsis Labs: Recent Labs  Lab 02/18/21 2135 02/19/21 0053 02/19/21 0534 02/21/21 1141 02/24/21 0920  PROCALCITON  --   --   --  0.95 0.24  LATICACIDVEN 2.3* 2.9* 2.0*  --   --     Recent Results (from the past 240 hour(s))  Resp Panel by RT-PCR (Flu A&B, Covid) Nasopharyngeal Swab     Status: None   Collection Time: 02/18/21  9:15 PM   Specimen: Nasopharyngeal Swab; Nasopharyngeal(NP) swabs in vial  transport medium  Result Value Ref Range Status   SARS Coronavirus 2 by RT PCR NEGATIVE NEGATIVE Final    Comment: (NOTE) SARS-CoV-2 target nucleic acids are NOT DETECTED.  The SARS-CoV-2 RNA is generally detectable in upper respiratory specimens during the acute phase of infection. The lowest concentration of SARS-CoV-2 viral copies this assay can detect is 138 copies/mL. A negative result does not preclude SARS-Cov-2 infection and should not be used as the sole basis for treatment or other patient management decisions. A negative result may occur with  improper specimen collection/handling, submission of specimen other than nasopharyngeal swab, presence of viral mutation(s) within the areas targeted by this assay, and inadequate number of viral copies(<138 copies/mL). A negative result must be combined with clinical observations, patient history, and epidemiological information. The expected result is Negative.  Fact Sheet for Patients:  EntrepreneurPulse.com.au  Fact Sheet for Healthcare Providers:  IncredibleEmployment.be  This test is no t yet approved or cleared by the [place] FDA and  has been authorized for detection and/or diagnosis of SARS-CoV-2 by FDA under an Emergency Use Authorization (EUA). This EUA will remain  in effect (meaning this test can be used) for the duration of the COVID-19 declaration under Section 564(b)(1) of the Act, 21 U.S.C.section 360bbb-3(b)(1), unless the authorization is terminated  or revoked sooner.       Influenza A by PCR NEGATIVE NEGATIVE Final   Influenza B by PCR NEGATIVE NEGATIVE Final    Comment: (NOTE) The Xpert Xpress SARS-CoV-2/FLU/RSV plus assay is intended as an aid in the diagnosis of influenza from Nasopharyngeal swab specimens and should not be used as a sole basis for treatment. Nasal washings and aspirates are unacceptable for Xpert Xpress SARS-CoV-2/FLU/RSV testing.  Fact  Sheet for Patients: EntrepreneurPulse.com.au  Fact Sheet for Healthcare Providers: IncredibleEmployment.be  This test is not yet approved or cleared by the [place] FDA and has been authorized for detection and/or diagnosis of SARS-CoV-2 by FDA under an Emergency Use Authorization (  EUA). This EUA will remain in effect (meaning this test can be used) for the duration of the COVID-19 declaration under Section 564(b)(1) of the Act, 21 U.S.C. section 360bbb-3(b)(1), unless the authorization is terminated or revoked.  Performed at [facility], [place] [address], [place], [place] [address]   Blood culture (routine x 2)     Status: None   Collection Time: 02/18/21  9:35 PM   Specimen: BLOOD RIGHT HAND  Result Value Ref Range Status   Specimen Description   Final    BLOOD RIGHT HAND Performed at [facility] [address] [place], [place] [address]    Special Requests   Final    BOTTLES DRAWN AEROBIC AND ANAEROBIC Blood Culture results may not be optimal due to an inadequate volume of blood received in culture bottles Performed at [facility] [address] [place], [place] [address]    Culture   Final    NO GROWTH 5 DAYS Performed at [facility], [place] [address], [place], [place] [address]    Report Status 02/24/2021 FINAL  Final  Blood culture (routine x 2)     Status: None   Collection Time: 02/18/21  9:35 PM   Specimen: BLOOD  Result Value Ref Range Status   Specimen Description   Final    BLOOD RIGHT ANTECUBITAL Performed at [facility] [address], [place], [place] [address]    Special Requests   Final    BOTTLES DRAWN AEROBIC AND ANAEROBIC Blood Culture results may not be optimal due to an inadequate volume of blood received in culture bottles Performed at [facility] [address], [place], [place] [address]    Culture    Final    NO GROWTH 5 DAYS Performed at [facility], [place] [address] [place], [place] [address]    Report Status 02/24/2021 FINAL  Final    Radiology Studies: DG CHEST PORT 1 VIEW  Result Date: 02/24/2021 CLINICAL DATA:  Pneumonia. EXAM: PORTABLE CHEST 1 VIEW COMPARISON:  February 23, 2021 FINDINGS: Cardiomegaly. The right upper lobe pneumonia persists but may be subtly improved in the interval. No other interval changes. No pneumothorax. IMPRESSION: 1. Right upper lobe pneumonia persists but may be subtly improved in the interval. Recommend follow-up to complete resolution. No other changes. Electronically Signed   By: Dorise Bullion III M.D   On: 02/24/2021 13:07   DG CHEST PORT 1 VIEW  Result Date: 02/23/2021 CLINICAL DATA:  Follow-up pneumonia EXAM: PORTABLE CHEST 1 VIEW COMPARISON:  02/18/2021 FINDINGS: Previous median sternotomy and CABG procedure. No pleural effusion or edema. Mild progression of right upper lobe airspace disease. No new findings. IMPRESSION: Mild progression of right upper lobe airspace disease. Electronically Signed   By: Kerby Moors M.D.   On: 02/23/2021 07:42   Scheduled Meds: . furosemide  40 mg Intravenous Daily  . gabapentin  400 mg Oral Q breakfast  . gabapentin  800 mg Oral QHS  . guaiFENesin  600 mg Oral BID  . lactobacillus  1 g Oral TID WC  . levothyroxine  25 mcg Oral Q0600  . metoprolol succinate  50 mg Oral BID  . sacubitril-valsartan  1 tablet Oral BID  . sodium chloride flush  3 mL Intravenous Q12H  . warfarin  5 mg Oral ONCE-1600  . Warfarin - Pharmacist Dosing Inpatient   Does not apply q1600   Continuous Infusions: . sodium chloride 250  mL (02/23/21 2135)     LOS: 5 days    Time spent: 25 mins    Bricia Taher, MD [facility]   If 7PM-7AM, please contact night-coverage

## 2021-02-25 LAB — BASIC METABOLIC PANEL
Anion gap: 8 (ref 5–15)
BUN: 19 mg/dL (ref 8–23)
CO2: 26 mmol/L (ref 22–32)
Calcium: 8.8 mg/dL — ABNORMAL LOW (ref 8.9–10.3)
Chloride: 100 mmol/L (ref 98–111)
Creatinine, Ser: 0.86 mg/dL (ref 0.44–1.00)
GFR, Estimated: >60 mL/min (ref >60)
Glucose, Bld: 123 mg/dL — ABNORMAL HIGH (ref 70–99)
Potassium: 3.5 mmol/L (ref 3.5–5.1)
Sodium: 134 mmol/L — ABNORMAL LOW (ref 135–145)

## 2021-02-25 LAB — GLUCOSE, CAPILLARY
Glucose-Capillary: 116 mg/dL — ABNORMAL HIGH (ref 70–99)
Glucose-Capillary: 182 mg/dL — ABNORMAL HIGH (ref 70–99)
Glucose-Capillary: 113 mg/dL — ABNORMAL HIGH (ref 70–99)
Glucose-Capillary: 155 mg/dL — ABNORMAL HIGH (ref 70–99)

## 2021-02-25 LAB — CBC
HCT: 37.2 % (ref 36.0–46.0)
Hemoglobin: 12.3 g/dL (ref 12.0–15.0)
MCH: 32.9 pg (ref 26.0–34.0)
MCHC: 33.1 g/dL (ref 30.0–36.0)
MCV: 99.5 fL (ref 80.0–100.0)
Platelets: 243 10*3/uL (ref 150–400)
RBC: 3.74 MIL/uL — ABNORMAL LOW (ref 3.87–5.11)
RDW: 15.3 % (ref 11.5–15.5)
WBC: 7.7 10*3/uL (ref 4.0–10.5)
nRBC: 0 % (ref 0.0–0.2)

## 2021-02-25 LAB — MAGNESIUM: Magnesium: 2.3 mg/dL (ref 1.7–2.4)

## 2021-02-25 LAB — PHOSPHORUS: Phosphorus: 2.9 mg/dL (ref 2.5–4.6)

## 2021-02-25 LAB — PROTIME-INR
INR: 1.5 — ABNORMAL HIGH (ref 0.8–1.2)
Prothrombin Time: 17.8 seconds — ABNORMAL HIGH (ref 11.4–15.2)

## 2021-02-25 MED ORDER — WARFARIN SODIUM 6 MG PO TABS
6.0000 mg | ORAL_TABLET | Freq: Once | ORAL | Status: AC
  Administered 2021-02-25: 6 mg via ORAL
  Filled 2021-02-25: qty 1

## 2021-02-25 MED ORDER — LIVING WELL WITH DIABETES BOOK
Freq: Once | Status: AC
  Filled 2021-02-25 (×2): qty 1

## 2021-02-25 MED ORDER — FUROSEMIDE 40 MG PO TABS
40.0000 mg | ORAL_TABLET | Freq: Two times a day (BID) | ORAL | Status: DC
  Administered 2021-02-25 – 2021-02-27 (×4): 40 mg via ORAL
  Filled 2021-02-25 (×4): qty 1

## 2021-02-25 NOTE — Progress Notes (Signed)
Physical Therapy Treatment Patient Details Name: Natasha Livingston MRN: [id number] DOB: [birth year] Today's Date: 02/25/2021    History of Present Illness This 87 years old female with history of atrial fibrillation on Coumadin, hypertension, chronic systolic heart failure with last known EF in 2019 of 35%, Neuropathy presented in the ED 02/18/21 with c/o: cough and shortness of breath ,chest x-ray showed possible right upper lobe infiltrate, diagnosed with pneumonia    PT Comments    Assisted pt OOB to amb to bathroom was difficult.  General transfer comment: very unsteady this session and with incontrolled stand to sit onto bed due to fatigue.  Also assisted with a toilet transfer pt was unable to safely stand, maintain balance and perfrm self peri care.  Near fall in bathroom when attempting.  Heavy "furniture" walking and reaching for doorframme to steady self despite having walker.General Gait Details: very unstaedy gait around bed and into bathroom.  VC's on proper walker use but void urgency pt was distracted.  Very unsteady with static standing to attempt self peri care.  Near fall in batyhroom/therapist recovered.  Esp unsteady this session and very limited activity tolerance as just amb to and from bathroom "wore her out" .  HIGH FALL RISK.  Pt should NOT amb on her own. Pt will need HANDS ON assist for all mobility.   Dyspnea 3/4.  HR 84 RA 91% General bed mobility comments: required increased time and increased assist back to bed due to max c/o fatigue.  Pt actually "plopped" back onto bed uncontrolled.   Follow Up Recommendations  Pt will need 24/7 care if returns to home otherwise SNF 24 hour care [facility]      Equipment Recommendations       Recommendations for Other Services       Precautions / Restrictions Precautions Precautions: Fall    Mobility  Bed Mobility Overal bed mobility: Needs Assistance Bed Mobility: Supine to Sit;Sit to Supine     Supine to sit:  Supervision Sit to supine: Supervision;Min guard   General bed mobility comments: required increased time and increased assist back to bed due to max c/o fatigue.  Pt actually "plopped" back onto bed uncontrolled.    Transfers Overall transfer level: Needs assistance Equipment used: Rolling walker (2 wheeled) Transfers: Sit to/from [facility] Sit to Stand: Min assist Stand pivot transfers: Min assist;Mod assist       General transfer comment: very unsteady this session and with incontrolled stand to sit onto bed due to fatigue.  Also assisted with a toilet transfer pt was unable to safely stand, maintain balance and perfrm self peri care.  Near fall in bathroom when attempting.  Heavy "furniture" walking and reaching for doorframme to steady self despite having walker.  Ambulation/Gait Ambulation/Gait assistance: Mod assist Gait Distance (Feet): 24 Feet (12 feet x 2 to and from bathroom only due to 3/4 dyspnea) Assistive device: Rolling walker (2 wheeled);None Gait Pattern/deviations: Step-through pattern;Decreased stride length;Trunk flexed Gait velocity: decreased   General Gait Details: very unstaedy gait around bed and into bathroom.  VC's on proper walker use but void urgency pt was distracted.  Very unsteady with static standing to attempt self peri care.  Near fall in batyhroom/therapist recovered.  Esp unsteady this session and very limited activity tolerance as just amb to and from bathroom "wore her out" .  HIGH FALL RISK.  Pt should NOT amb on her own. Pt will need HANDS ON assist for all mobility.  Dyspnea 3/4.  HR 84 RA 91%   Stairs             Wheelchair Mobility    Modified Rankin (Stroke Patients Only)       Balance                                            Cognition Arousal/Alertness: Awake/alert Behavior During Therapy: WFL for tasks assessed/performed Overall Cognitive Status: Impaired/Different from  baseline Area of Impairment: Orientation;Memory;Following commands;Awareness                                      Exercises      General Comments        Pertinent Vitals/Pain Pain Assessment: Faces Pain Location: Back with standing at sink. Pain Descriptors / Indicators: Aching Pain Intervention(s): Monitored during session    Home Living                      Prior Function            PT Goals (current goals can now be found in the care plan section) Progress towards PT goals: Progressing toward goals    Frequency    Min 3X/week      PT Plan Current plan remains appropriate    Co-evaluation              AM-PAC PT "6 Clicks" Mobility   Outcome Measure  Help needed turning from your back to your side while in a flat bed without using bedrails?: A Little Help needed moving from lying on your back to sitting on the side of a flat bed without using bedrails?: A Little Help needed moving to and from a bed to a chair (including a wheelchair)?: A Little Help needed standing up from a chair using your arms (e.g., wheelchair or bedside chair)?: A Little Help needed to walk in hospital room?: A Little Help needed climbing 3-5 steps with a railing? : A Lot 6 Click Score: 17    End of Session Equipment Utilized During Treatment: Gait belt Activity Tolerance: Patient limited by fatigue Patient left: in bed;with call bell/phone within reach;with bed alarm set Nurse Communication: Mobility status PT Visit Diagnosis: Difficulty in walking, not elsewhere classified (R26.2);Unsteadiness on feet (R26.81)     Time: 3810-1751 PT Time Calculation (min) (ACUTE ONLY): 24 min  Charges:  $Gait Training: 8-22 mins $Therapeutic Activity: 8-22 mins                     Rica Koyanagi  PTA Acute  Rehabilitation Services Pager      [phone] Office      [phone]

## 2021-02-25 NOTE — Care Management Important Message (Signed)
Important Message  Patient Details IM Letter given to the Patient. Name: Natasha Livingston MRN: [id number] Date of Birth: 1934/01/31   Medicare Important Message Given:  Yes     Kerin Salen 02/25/2021, 12:18 PM

## 2021-02-25 NOTE — Progress Notes (Addendum)
Occupational Therapy Treatment Patient Details Name: Natasha Livingston MRN: [id number] DOB: 1934-02-02 Today's Date: 02/25/2021    History of present illness This 87 years old female with history of atrial fibrillation on Coumadin, hypertension, chronic systolic heart failure with last known EF in 2019 of 35%, Neuropathy presented in the ED 02/18/21 with c/o: cough and shortness of breath ,chest x-ray showed possible right upper lobe infiltrate, diagnosed with pneumonia   OT comments  Patient progressing and showed improved ability to stand at sink for 5 min to complete grooming tasks compared to previous session where pt required a seated positoning to complete ADLs. Patient limited by back pain with standing, increased cognitive deficits from baseline according to son, and decreased activity tolerance, along with deficits noted below. Pt continues to demonstrate good rehab potential and would benefit from continued skilled OT to increase safety and independence with ADLs and functional transfers to allow pt to return home safely and reduce caregiver burden and fall risk.   Follow Up Recommendations  SNF- Per interdisciplinary communication with physical therapy, pt will need SNF before returning home unless CLOSE 24/7 CARE can be CONFIRMED.      Equipment Recommendations       Recommendations for Other Services      Precautions / Restrictions Precautions Precautions: Fall Restrictions Weight Bearing Restrictions: No       Mobility Bed Mobility Overal bed mobility: Needs Assistance Bed Mobility: Supine to Sit     Supine to sit: Supervision          Transfers Overall transfer level: Needs assistance Equipment used: Rolling walker (2 wheeled) Transfers: Sit to/from [facility] Sit to Stand: Min guard Stand pivot transfers: Min guard       General transfer comment: Min guard for safety. Increased time. Cues required for hand placement when pushing off  EOB.    Balance Overall balance assessment: Needs assistance;History of Falls Sitting-balance support: Feet supported;Single extremity supported Sitting balance-Leahy Scale: Good     Standing balance support: Single extremity supported Standing balance-Leahy Scale: Fair Standing balance comment: Min guard for safety                           ADL either performed or assessed with clinical judgement   ADL Overall ADL's : Needs assistance/impaired     Grooming: Standing;Wash/dry face;Oral care;Brushing hair;Min guard Grooming Details (indicate cue type and reason): Pt instructed on how to safely approach sink with RW and demonstrated back with verbal cues. Pt stood at sink ~5 min and performed grooming tasks with unilateral UE support on sink or RW for balance, and report of increasing back pain from learning forward over sink. Min guard needed throughout activity due to mild unsteadiness with static standing.                   Toilet Transfer Details (indicate cue type and reason): Pt denied need to void.         Functional mobility during ADLs: Min guard;Rolling walker       Vision Patient Visual Report: No change from baseline     Perception     Praxis      Cognition Arousal/Alertness: Awake/alert Behavior During Therapy: WFL for tasks assessed/performed Overall Cognitive Status: Impaired/Different from baseline (Per son, pt with mild memory deficits at baseline which son expressed feel exacerbated since illness.) Area of Impairment: Orientation;Memory;Following commands;Awareness  Orientation Level: Disoriented to;Place;Time;Situation Current Attention Level: Selective Memory: Decreased short-term memory Following Commands: Follows one step commands with increased time   Awareness: Emergent   General Comments: pleasantly confused, needs increased time for processing. Unable to state current month even when pt gave DOB and  cued it is currently her birthday month.  Son reports that he has now arranged for 24/7 care.        Exercises     Shoulder Instructions       General Comments      Pertinent Vitals/ Pain       Pain Assessment: Faces Faces Pain Scale: Hurts little more Pain Location: Back with standing at sink. Pain Descriptors / Indicators: Aching Pain Intervention(s): Limited activity within patient's tolerance;Monitored during session;Repositioned (Assisted pt to recliner.)  Home Living                                          Prior Functioning/Environment              Frequency  Min 2X/week        Progress Toward Goals  OT Goals(current goals can now be found in the care plan section)  Progress towards OT goals: Progressing toward goals  Acute Rehab OT Goals Patient Stated Goal: to go home at discharge OT Goal Formulation: With patient/family Time For Goal Achievement: 03/07/21 Potential to Achieve Goals: Good  Plan Discharge plan remains appropriate    Co-evaluation                 AM-PAC OT "6 Clicks" Daily Activity     Outcome Measure   Help from another person eating meals?: None Help from another person taking care of personal grooming?: A Little Help from another person toileting, which includes using toliet, bedpan, or urinal?: A Little Help from another person bathing (including washing, rinsing, drying)?: A Little Help from another person to put on and taking off regular upper body clothing?: A Little Help from another person to put on and taking off regular lower body clothing?: A Little 6 Click Score: 19    End of Session Equipment Utilized During Treatment: Gait belt;Rolling walker  OT Visit Diagnosis: Other symptoms and signs involving cognitive function   Activity Tolerance Patient tolerated treatment well   Patient Left in chair;with call bell/phone within reach;with family/visitor present (CNA in room with pt)   Nurse  Communication Mobility status        Time: 8882-8003 OT Time Calculation (min): 23 min  Charges: OT General Charges $OT Visit: 1 Visit OT Treatments $Self Care/Home Management : 8-22 mins $Therapeutic Activity: 8-22 mins  Anderson Malta, OT Acute Rehab Services Office: [phone] 02/25/2021   Julien Girt 02/25/2021, 12:27 PM

## 2021-02-25 NOTE — Progress Notes (Signed)
PROGRESS NOTE    Natasha Livingston  [id number] DOB: [birth year] DOA: 02/18/2021 PCP: Cyndi Bender, PA-C     Brief Narrative:  Natasha Livingston is an 87 years old female with history of atrial fibrillation on Coumadin, hypertension, chronic systolic heart failure with last known EF in 2019 of 35%, neuropathy, who presented in the ED with chief complaint of cough and shortness of breath for 3 days.  Patient reports coughing up yellow phlegm in the ED, chest x-ray showed possible right upper lobe infiltrate.  Patient is diagnosed with pneumonia and started on IV antibiotics( Rocephin and Zithromax).   New events last 24 hours / Subjective: Patient seen with son and RN at bedside.  They tell me that patient was tachypneic with activity up to the bedside commode.  Son does not feel that patient is ready for discharge home yet.  Assessment & Plan:   Principal Problem:   Right upper lobe pneumonia Active Problems:   Hypertension   Neuropathy   Long term (current) use of anticoagulants - on coumadin for afib   Paroxysmal atrial fibrillation (HCC)   Chronic combined systolic and diastolic CHF (congestive heart failure) ([place]) - echo 02-2018 LVEF 35-40%   Hyperglycemia   CAP (community acquired pneumonia)   Community-acquired pneumonia; Patient presented with cough and shortness of breath.   CT chest without contrast shows right upper and lower lobe peribronchovascular groundglass and consolidative airspace opacities suggestive of infection/inflammation. Patient started on Rocephin and Zithromax for 5 days. Follow-up chest x-ray in 3 to 4 weeks,  recommended to ensure complete resolution of findings.  Procalcitonin 0.95 > 0.25, Blood cultures are negative to date. Repeat CXR showed persistent pneumonia but improving. Last day of antibiotics 02/24/21.  Remains on room air this morning.  Paroxysmal atrial fibrillation: Continue Coumadin, metoprolol Rate well controlled today  Chronic  systolic/diastolic heart failure: Last echocardiogram from 2019 showed EF of 35 to 40%.  BNP 315.4 Does not appear to be in fluid overload at this time. Currently euvolemic.  Continue Entresto, lasix.  Hypertension: Continue metoprolol, sacubitril/valsartan, Lasix  Hypothyroidism: Continue Synthroid  Peripheral neuropathy: Continue Neurontin  Hyperglycemia:> DM II Hb A1c 7.4 Diabetic coordinater consult Sliding scale insulin   DVT prophylaxis:  SCDs Start: 02/19/21 0053 warfarin (COUMADIN) tablet 6 mg  Code Status:     Code Status Orders  (From admission, onward)         Start     Ordered   02/19/21 0053  Full code  Continuous        02/19/21 0052        Code Status History    Date Active Date Inactive Code Status Order ID Comments User Context   02/18/2021 2140 02/19/2021 0052 Full Code [id number]  Kristopher Oppenheim, DO ED   Advance Care Planning Activity    Advance Directive Documentation   Flowsheet Row Most Recent Value  Type of Advance Directive Living will  Pre-existing out of facility DNR order (yellow form or pink MOST form) --  "MOST" Form in Place? --     Family Communication: Son at bedside Disposition Plan:  Status is: Inpatient  Remains inpatient appropriate because:Inpatient level of care appropriate due to severity of illness   Dispo: The patient is from: Home              Anticipated d/c is to: Home              Patient currently is not medically stable to  d/c.   Difficult to place patient No     Antimicrobials:  Anti-infectives (From admission, onward)   Start     Dose/Rate Route Frequency Ordered Stop   02/19/21 2200  cefTRIAXone (ROCEPHIN) 2 g in sodium chloride 0.9 % 100 mL IVPB        2 g 200 mL/hr over 30 Minutes Intravenous Every 24 hours 02/19/21 0052 02/23/21 2207   02/19/21 2200  azithromycin (ZITHROMAX) tablet 500 mg        500 mg Oral Daily at bedtime 02/19/21 0052 02/23/21 2138   02/18/21 2115  cefTRIAXone (ROCEPHIN) 2 g  in sodium chloride 0.9 % 100 mL IVPB        2 g 200 mL/hr over 30 Minutes Intravenous  Once 02/18/21 2102 02/18/21 2300   02/18/21 2115  azithromycin (ZITHROMAX) 500 mg in sodium chloride 0.9 % 250 mL IVPB        500 mg 250 mL/hr over 60 Minutes Intravenous  Once 02/18/21 2102 02/19/21 0130        Objective: Vitals:   02/24/21 2203 02/25/21 0453 02/25/21 0500 02/25/21 1252  BP:  121/87  98/75  Pulse:  74  79  Resp:  18  18  Temp:  98.7 F (37.1 C)  98.1 F (36.7 C)  TempSrc:    Oral  SpO2: 97% 97%  98%  Weight:   69.7 kg   Height:        Intake/Output Summary (Last 24 hours) at 02/25/2021 1442 Last data filed at 02/25/2021 1300 Gross per 24 hour  Intake 720 ml  Output 800 ml  Net -80 ml   Filed Weights   02/23/21 0500 02/24/21 0500 02/25/21 0500  Weight: 68.1 kg 67.6 kg 69.7 kg    Examination:  General exam: Appears calm and comfortable  Respiratory system: Clear to auscultation. Respiratory effort normal. No respiratory distress. No conversational dyspnea. On room air  Cardiovascular system: S1 & S2 heard, irregular rhythm, rate 80s. No murmurs. No pedal edema. Gastrointestinal system: Abdomen is nondistended, soft and nontender. Normal bowel sounds heard. Central nervous system: Alert and oriented. No focal neurological deficits. Speech clear.  Extremities: Symmetric in appearance  Skin: No rashes, lesions or ulcers on exposed skin  Psychiatry: Judgement and insight appear normal. Mood & affect appropriate.   Data Reviewed: I have personally reviewed following labs and imaging studies  CBC: Recent Labs  Lab 02/18/21 1833 02/19/21 0522 02/20/21 0445 02/21/21 0457 02/22/21 0454 02/23/21 0448 02/24/21 0717 02/25/21 0624  WBC 13.7* 10.7*   < > 11.0* 11.4* 8.5 9.7 7.7  NEUTROABS 11.3* 8.6*  --   --   --   --   --   --   HGB 12.6 12.5   < > 11.8* 13.1 14.8 13.0 12.3  HCT 38.3 37.3   < > 35.5* 39.9 44.0 39.6 37.2  MCV 100.3* 99.7   < > 98.9 101.8* 98.4 100.5*  99.5  PLT 221 183   < > 194 254 192 233 243   < > = values in this interval not displayed.   Basic Metabolic Panel: Recent Labs  Lab 02/18/21 2135 02/19/21 0522 02/20/21 0445 02/21/21 0457 02/22/21 0454 02/23/21 0448 02/25/21 0624  NA  --  137 136 136 135 135 134*  K  --  3.7 3.7 3.1* 4.3 4.1 3.5  CL  --  105 104 104 103 100 100  CO2  --  20* 21* 22 22 24 26   GLUCOSE  --  177* 159* 146* 154* 104* 123*  BUN  --  23 22 20 21 22 19   CREATININE  --  0.82 0.76 0.75 0.86 0.84 0.86  CALCIUM  --  8.9 9.1 8.9 9.4 9.0 8.8*  MG 2.1 2.0  --   --   --  2.2 2.3  PHOS  --   --   --   --   --  2.8 2.9   GFR: Estimated Creatinine Clearance: 46.5 mL/min (by C-G formula based on SCr of 0.86 mg/dL). Liver Function Tests: Recent Labs  Lab 02/18/21 1833 02/19/21 0522  AST 36 33  ALT 31 30  ALKPHOS 156* 142*  BILITOT 3.7* 3.0*  PROT 8.2* 6.9  ALBUMIN 3.7 3.2*   No results for input(s): LIPASE, AMYLASE in the last 168 hours. No results for input(s): AMMONIA in the last 168 hours. Coagulation Profile: Recent Labs  Lab 02/21/21 0457 02/22/21 0454 02/23/21 0448 02/24/21 0717 02/25/21 0624  INR 3.6* 4.2* 2.4* 1.5* 1.5*   Cardiac Enzymes: No results for input(s): CKTOTAL, CKMB, CKMBINDEX, TROPONINI in the last 168 hours. BNP (last 3 results) No results for input(s): PROBNP in the last 8760 hours. HbA1C: Recent Labs    02/24/21 0717  HGBA1C 7.5*   CBG: Recent Labs  Lab 02/24/21 0801 02/24/21 1631 02/24/21 2131 02/25/21 0729 02/25/21 1130  GLUCAP 133* 134* 141* 116* 182*   Lipid Profile: No results for input(s): CHOL, HDL, LDLCALC, TRIG, CHOLHDL, LDLDIRECT in the last 72 hours. Thyroid Function Tests: No results for input(s): TSH, T4TOTAL, FREET4, T3FREE, THYROIDAB in the last 72 hours. Anemia Panel: No results for input(s): VITAMINB12, FOLATE, FERRITIN, TIBC, IRON, RETICCTPCT in the last 72 hours. Sepsis Labs: Recent Labs  Lab 02/18/21 2135 02/19/21 0053  02/19/21 0534 02/21/21 1141 02/24/21 0920  PROCALCITON  --   --   --  0.95 0.24  LATICACIDVEN 2.3* 2.9* 2.0*  --   --     Recent Results (from the past 240 hour(s))  Resp Panel by RT-PCR (Flu A&B, Covid) Nasopharyngeal Swab     Status: None   Collection Time: 02/18/21  9:15 PM   Specimen: Nasopharyngeal Swab; Nasopharyngeal(NP) swabs in vial transport medium  Result Value Ref Range Status   SARS Coronavirus 2 by RT PCR NEGATIVE NEGATIVE Final    Comment: (NOTE) SARS-CoV-2 target nucleic acids are NOT DETECTED.  The SARS-CoV-2 RNA is generally detectable in upper respiratory specimens during the acute phase of infection. The lowest concentration of SARS-CoV-2 viral copies this assay can detect is 138 copies/mL. A negative result does not preclude SARS-Cov-2 infection and should not be used as the sole basis for treatment or other patient management decisions. A negative result may occur with  improper specimen collection/handling, submission of specimen other than nasopharyngeal swab, presence of viral mutation(s) within the areas targeted by this assay, and inadequate number of viral copies(<138 copies/mL). A negative result must be combined with clinical observations, patient history, and epidemiological information. The expected result is Negative.  Fact Sheet for Patients:  EntrepreneurPulse.com.au  Fact Sheet for Healthcare Providers:  IncredibleEmployment.be  This test is no t yet approved or cleared by the [place] FDA and  has been authorized for detection and/or diagnosis of SARS-CoV-2 by FDA under an Emergency Use Authorization (EUA). This EUA will remain  in effect (meaning this test can be used) for the duration of the COVID-19 declaration under Section 564(b)(1) of the Act, 21 U.S.C.section 360bbb-3(b)(1), unless the authorization is terminated  or revoked  sooner.       Influenza A by PCR NEGATIVE NEGATIVE Final    Influenza B by PCR NEGATIVE NEGATIVE Final    Comment: (NOTE) The Xpert Xpress SARS-CoV-2/FLU/RSV plus assay is intended as an aid in the diagnosis of influenza from Nasopharyngeal swab specimens and should not be used as a sole basis for treatment. Nasal washings and aspirates are unacceptable for Xpert Xpress SARS-CoV-2/FLU/RSV testing.  Fact Sheet for Patients: EntrepreneurPulse.com.au  Fact Sheet for Healthcare Providers: IncredibleEmployment.be  This test is not yet approved or cleared by the [place] FDA and has been authorized for detection and/or diagnosis of SARS-CoV-2 by FDA under an Emergency Use Authorization (EUA). This EUA will remain in effect (meaning this test can be used) for the duration of the COVID-19 declaration under Section 564(b)(1) of the Act, 21 U.S.C. section 360bbb-3(b)(1), unless the authorization is terminated or revoked.  Performed at [facility], [place] [address], [place], [place] [address]   Blood culture (routine x 2)     Status: None   Collection Time: 02/18/21  9:35 PM   Specimen: BLOOD RIGHT HAND  Result Value Ref Range Status   Specimen Description   Final    BLOOD RIGHT HAND Performed at [facility] [address], [place], [place] [address]    Special Requests   Final    BOTTLES DRAWN AEROBIC AND ANAEROBIC Blood Culture results may not be optimal due to an inadequate volume of blood received in culture bottles Performed at [facility] [address], [place], [place] [address]    Culture   Final    NO GROWTH 5 DAYS Performed at [facility], [place] [address], [place], [place] [address]    Report Status 02/24/2021 FINAL  Final  Blood culture (routine x 2)     Status: None   Collection Time: 02/18/21  9:35 PM   Specimen: BLOOD  Result Value Ref Range Status   Specimen Description   Final    BLOOD RIGHT ANTECUBITAL Performed at  [facility] [address], [place], [place] [address]    Special Requests   Final    BOTTLES DRAWN AEROBIC AND ANAEROBIC Blood Culture results may not be optimal due to an inadequate volume of blood received in culture bottles Performed at [facility] [address] [place], [place] [address]    Culture   Final    NO GROWTH 5 DAYS Performed at [facility], [place] [address] [place],  [address]    Report Status 02/24/2021 FINAL  Final      Radiology Studies: DG CHEST PORT 1 VIEW  Result Date: 02/24/2021 CLINICAL DATA:  Pneumonia. EXAM: PORTABLE CHEST 1 VIEW COMPARISON:  February 23, 2021 FINDINGS: Cardiomegaly. The right upper lobe pneumonia persists but may be subtly improved in the interval. No other interval changes. No pneumothorax. IMPRESSION: 1. Right upper lobe pneumonia persists but may be subtly improved in the interval. Recommend follow-up to complete resolution. No other changes. Electronically Signed   By: Dorise Bullion III M.D   On: 02/24/2021 13:07      Scheduled Meds: . furosemide  40 mg Oral BID  . gabapentin  400 mg Oral Q breakfast  . gabapentin  800 mg Oral QHS  . guaiFENesin  600 mg Oral BID  . insulin aspart  0-6 Units Subcutaneous TID WC  . levothyroxine  25 mcg Oral Q0600  . living well with  diabetes book   Does not apply Once  . metoprolol succinate  50 mg Oral BID  . sacubitril-valsartan  1 tablet Oral BID  . sodium chloride flush  3 mL Intravenous Q12H  . warfarin  6 mg Oral ONCE-1600  . Warfarin - Pharmacist Dosing Inpatient   Does not apply q1600   Continuous Infusions: . sodium chloride 250 mL (02/23/21 2135)     LOS: 6 days      Time spent: 25 minutes   Dessa Phi, DO [facility] 02/25/2021, 2:42 PM   Available via Epic secure chat 7am-7pm After these hours, please refer to coverage provider listed on [email or web address]

## 2021-02-25 NOTE — Progress Notes (Signed)
[facility] for Coumadin Indication: atrial fibrillation  Allergies  Allergen Reactions  . Amiodarone Other (See Comments)    unknown  . Crestor [Rosuvastatin Calcium] Other (See Comments)    unknown  . Lipitor [Atorvastatin Calcium] Swelling    Patient Measurements: Height: 5\' 7"  (170.2 cm) Weight: 69.7 kg (153 lb 10.6 oz) IBW/kg (Calculated) : 61.6  Vital Signs: Temp: 98.7 F (37.1 C) (04/25 0453) BP: 121/87 (04/25 0453) Pulse Rate: 74 (04/25 0453)  Labs: Recent Labs    02/23/21 0448 02/24/21 0717 02/25/21 0624  HGB 14.8 13.0 12.3  HCT 44.0 39.6 37.2  PLT 192 233 243  LABPROT 26.1* 18.3* 17.8*  INR 2.4* 1.5* 1.5*  CREATININE 0.84  --  0.86    Estimated Creatinine Clearance: 46.5 mL/min (by C-G formula based on SCr of 0.86 mg/dL).   Medications:  Warfarin PTA- 5mg  daily except 2.5mg  on Tues/Thur/Sat.  LD 4/18 @ 10a  Assessment: [name] with hx of afib on Coumadin.  INR was SUPRAtherapeutic on admission at 5.5.  Elevated INR likely due to acute illness & poor oral intake for past several days. Rocephin + Zithromax given in ED which can also interact to elevate INR.    Of note, patient has been on current outpatient regimen since Dec 2021 with mostly therapeutic INR.    02/25/21 7:24 AM  - INR sub-therapeutic again today, resumed warfarin 4/23  - completed course of azithromcin 4/23 - Hgb now WNL, Plt stable WNL - no bleeding reported  Goal of Therapy:  INR 2-3   Plan:  Warfarin boost 6 mg today Daily PT/INR Monitor closely for s/sx of bleeding  Peggyann Juba, PharmD, BCPS Pharmacy: [phone] 02/25/2021,7:24 AM

## 2021-02-26 ENCOUNTER — Telehealth: Payer: Self-pay | Admitting: Cardiology

## 2021-02-26 LAB — GLUCOSE, CAPILLARY
Glucose-Capillary: 107 mg/dL — ABNORMAL HIGH (ref 70–99)
Glucose-Capillary: 138 mg/dL — ABNORMAL HIGH (ref 70–99)
Glucose-Capillary: 118 mg/dL — ABNORMAL HIGH (ref 70–99)
Glucose-Capillary: 218 mg/dL — ABNORMAL HIGH (ref 70–99)

## 2021-02-26 LAB — PROTIME-INR
INR: 1.7 — ABNORMAL HIGH (ref 0.8–1.2)
Prothrombin Time: 20.1 seconds — ABNORMAL HIGH (ref 11.4–15.2)

## 2021-02-26 MED ORDER — WARFARIN SODIUM 5 MG PO TABS
5.0000 mg | ORAL_TABLET | Freq: Once | ORAL | Status: AC
  Administered 2021-02-26: 5 mg via ORAL
  Filled 2021-02-26: qty 1

## 2021-02-26 MED ORDER — LIP MEDEX EX OINT
TOPICAL_OINTMENT | CUTANEOUS | Status: DC | PRN
  Administered 2021-02-26: 1 via TOPICAL
  Filled 2021-02-26: qty 7

## 2021-02-26 NOTE — Progress Notes (Signed)
[facility] for Coumadin Indication: atrial fibrillation  Allergies  Allergen Reactions  . Amiodarone Other (See Comments)    unknown  . Crestor [Rosuvastatin Calcium] Other (See Comments)    unknown  . Lipitor [Atorvastatin Calcium] Swelling    Patient Measurements: Height: 5\' 7"  (170.2 cm) Weight: 67.3 kg (148 lb 5.9 oz) IBW/kg (Calculated) : 61.6  Vital Signs: Temp: 97.6 F (36.4 C) (04/26 0456) BP: 109/84 (04/26 0456) Pulse Rate: 74 (04/26 0456)  Labs: Recent Labs    02/24/21 0717 02/25/21 0624 02/26/21 0510  HGB 13.0 12.3  --   HCT 39.6 37.2  --   PLT 233 243  --   LABPROT 18.3* 17.8* 20.1*  INR 1.5* 1.5* 1.7*  CREATININE  --  0.86  --     Estimated Creatinine Clearance: 46.5 mL/min (by C-G formula based on SCr of 0.86 mg/dL).   Medications:  Warfarin PTA- 5mg  daily except 2.5mg  on Tues/Thur/Sat.  LD 4/18 @ 10a  Assessment: [name] with hx of afib on Coumadin.  INR was SUPRAtherapeutic on admission at 5.5.  Elevated INR likely due to acute illness & poor oral intake for past several days. Rocephin + Zithromax given in ED which can also interact to elevate INR.    Of note, patient has been on current outpatient regimen since Dec 2021 with mostly therapeutic INR.    02/26/21 9:21 AM  INR sub-therapeutic again today, resumed warfarin 4/23  completed course of azithromcin 4/23 Hgb now WNL, Plt stable WNL no bleeding reported  Goal of Therapy:  INR 2-3   Plan:   Warfarin 5 mg today (if not discharged)  Daily PT/INR  Monitor closely for s/sx of bleeding  For discharge, recommend taking 5 mg today (normally take 2.5 mg on Tues) and then resume PTA regimen on Wednesday  Anyla Israelson, PharmD, BCPS [phone] 02/26/2021, 9:21 AM

## 2021-02-26 NOTE — Telephone Encounter (Signed)
Returned a call to the pt's son and advised that while in the Hospital that they will monitor the pt's INR and dose her Warfarin accordingly. He stated that he wanted to know the reason they can't get the INR up, advised he should talk to the Hospital team as they are taking care of her at this time. He verbalized understanding and stated he would ask them questions.

## 2021-02-26 NOTE — Progress Notes (Signed)
PROGRESS NOTE    Natasha Livingston  EXB:[id number] DOB: Mar 17, 1934 DOA: 02/18/2021 PCP: Cyndi Bender, PA-C     Brief Narrative:  Natasha Livingston is an 87 years old female with history of atrial fibrillation on Coumadin, hypertension, chronic systolic heart failure with last known EF in 2019 of 35%, neuropathy, who presented in the ED with chief complaint of cough and shortness of breath for 3 days.  Patient reports coughing up yellow phlegm in the ED, chest x-ray showed possible right upper lobe infiltrate.  Patient is diagnosed with pneumonia and started on IV antibiotics (Rocephin and Zithromax).   New events last 24 hours / Subjective: Patient seen with son at bedside.  Patient admits to some dyspnea with exertion, although remains stable on room air.  Has some dry cough.  States that she did well with PT yesterday, although per PT notes it sounds like ambulation to the bathroom was difficult, unsteady, and they are recommending 24-hour care versus SNF placement.  Assessment & Plan:   Principal Problem:   Right upper lobe pneumonia Active Problems:   Hypertension   Neuropathy   Long term (current) use of anticoagulants - on coumadin for afib   Paroxysmal atrial fibrillation (HCC)   Chronic combined systolic and diastolic CHF (congestive heart failure) ([place]) - echo 02-2018 LVEF 35-40%   Hyperglycemia   CAP (community acquired pneumonia)   Community-acquired pneumonia; Patient presented with cough and shortness of breath.   CT chest without contrast shows right upper and lower lobe peribronchovascular groundglass and consolidative airspace opacities suggestive of infection/inflammation. Patient started on Rocephin and Zithromax for 5 days. Follow-up chest x-ray in 3 to 4 weeks,  recommended to ensure complete resolution of findings.  Procalcitonin 0.95 > 0.25, Blood cultures are negative to date. Repeat CXR showed persistent pneumonia but improving. Last day of antibiotics 02/24/21.   Remains on room air this morning.  Paroxysmal atrial fibrillation: Continue Coumadin, metoprolol Rate well controlled today  Chronic systolic/diastolic heart failure: Last echocardiogram from 2019 showed EF of 35 to 40%.  BNP 315.4 Does not appear to be in fluid overload at this time. Currently euvolemic.  Continue Entresto, lasix.  Hypertension: Continue metoprolol, sacubitril/valsartan, Lasix  Hypothyroidism: Continue Synthroid  Peripheral neuropathy: Continue Neurontin  Hyperglycemia:> DM II Hb A1c 7.4 Diabetic coordinater consult Sliding scale insulin   DVT prophylaxis:  SCDs Start: 02/19/21 0053 warfarin (COUMADIN) tablet 5 mg  Code Status:     Code Status Orders  (From admission, onward)         Start     Ordered   02/19/21 0053  Full code  Continuous        02/19/21 0052        Code Status History    Date Active Date Inactive Code Status Order ID Comments User Context   02/18/2021 2140 02/19/2021 0052 Full Code [id number]  Kristopher Oppenheim, DO ED   Advance Care Planning Activity    Advance Directive Documentation   Flowsheet Row Most Recent Value  Type of Advance Directive Living will  Pre-existing out of facility DNR order (yellow form or pink MOST form) --  "MOST" Form in Place? --     Family Communication: Son at bedside Disposition Plan:  Status is: Inpatient  Remains inpatient appropriate because:Unsafe d/c plan   Dispo: The patient is from: Home              Anticipated d/c is to: To be determined  Patient currently is medically stable to d/c.  Son to decide between SNF versus 24-hour care at home   Difficult to place patient No     Antimicrobials:  Anti-infectives (From admission, onward)   Start     Dose/Rate Route Frequency Ordered Stop   02/19/21 2200  cefTRIAXone (ROCEPHIN) 2 g in sodium chloride 0.9 % 100 mL IVPB        2 g 200 mL/hr over 30 Minutes Intravenous Every 24 hours 02/19/21 0052 02/23/21 2207    02/19/21 2200  azithromycin (ZITHROMAX) tablet 500 mg        500 mg Oral Daily at bedtime 02/19/21 0052 02/23/21 2138   02/18/21 2115  cefTRIAXone (ROCEPHIN) 2 g in sodium chloride 0.9 % 100 mL IVPB        2 g 200 mL/hr over 30 Minutes Intravenous  Once 02/18/21 2102 02/18/21 2300   02/18/21 2115  azithromycin (ZITHROMAX) 500 mg in sodium chloride 0.9 % 250 mL IVPB        500 mg 250 mL/hr over 60 Minutes Intravenous  Once 02/18/21 2102 02/19/21 0130       Objective: Vitals:   02/25/21 1252 02/25/21 2047 02/26/21 0455 02/26/21 0456  BP: 98/75 127/88  109/84  Pulse: 79 80  74  Resp: 18 20  (!) 24  Temp: 98.1 F (36.7 C) (!) 97.5 F (36.4 C)  97.6 F (36.4 C)  TempSrc: Oral Oral    SpO2: 98% 94%  95%  Weight:   67.3 kg   Height:        Intake/Output Summary (Last 24 hours) at 02/26/2021 1225 Last data filed at 02/26/2021 0500 Gross per 24 hour  Intake 480 ml  Output 950 ml  Net -470 ml   Filed Weights   02/24/21 0500 02/25/21 0500 02/26/21 0455  Weight: 67.6 kg 69.7 kg 67.3 kg   Examination: General exam: Appears calm and comfortable  Respiratory system: Clear to auscultation. Respiratory effort normal.  On room air Cardiovascular system: S1 & S2 heard, irregular rhythm, rate 70s. No pedal edema. Gastrointestinal system: Abdomen is nondistended, soft and nontender. Normal bowel sounds heard. Central nervous system: Alert and oriented. Non focal exam. Speech clear  Extremities: Symmetric in appearance bilaterally  Skin: No rashes, lesions or ulcers on exposed skin  Psychiatry: Judgement and insight appear stable. Mood & affect appropriate.   Data Reviewed: I have personally reviewed following labs and imaging studies  CBC: Recent Labs  Lab 02/21/21 0457 02/22/21 0454 02/23/21 0448 02/24/21 0717 02/25/21 0624  WBC 11.0* 11.4* 8.5 9.7 7.7  HGB 11.8* 13.1 14.8 13.0 12.3  HCT 35.5* 39.9 44.0 39.6 37.2  MCV 98.9 101.8* 98.4 100.5* 99.5  PLT 194 254 192 233 485    Basic Metabolic Panel: Recent Labs  Lab 02/20/21 0445 02/21/21 0457 02/22/21 0454 02/23/21 0448 02/25/21 0624  NA 136 136 135 135 134*  K 3.7 3.1* 4.3 4.1 3.5  CL 104 104 103 100 100  CO2 21* 22 22 24 26   GLUCOSE 159* 146* 154* 104* 123*  BUN 22 20 21 22 19   CREATININE 0.76 0.75 0.86 0.84 0.86  CALCIUM 9.1 8.9 9.4 9.0 8.8*  MG  --   --   --  2.2 2.3  PHOS  --   --   --  2.8 2.9   GFR: Estimated Creatinine Clearance: 46.5 mL/min (by C-G formula based on SCr of 0.86 mg/dL). Liver Function Tests: No results for input(s): AST, ALT, ALKPHOS, BILITOT,  PROT, ALBUMIN in the last 168 hours. No results for input(s): LIPASE, AMYLASE in the last 168 hours. No results for input(s): AMMONIA in the last 168 hours. Coagulation Profile: Recent Labs  Lab 02/22/21 0454 02/23/21 0448 02/24/21 0717 02/25/21 0624 02/26/21 0510  INR 4.2* 2.4* 1.5* 1.5* 1.7*   Cardiac Enzymes: No results for input(s): CKTOTAL, CKMB, CKMBINDEX, TROPONINI in the last 168 hours. BNP (last 3 results) No results for input(s): PROBNP in the last 8760 hours. HbA1C: Recent Labs    02/24/21 0717  HGBA1C 7.5*   CBG: Recent Labs  Lab 02/25/21 1130 02/25/21 1616 02/25/21 2045 02/26/21 0733 02/26/21 1115  GLUCAP 182* 113* 155* 107* 138*   Lipid Profile: No results for input(s): CHOL, HDL, LDLCALC, TRIG, CHOLHDL, LDLDIRECT in the last 72 hours. Thyroid Function Tests: No results for input(s): TSH, T4TOTAL, FREET4, T3FREE, THYROIDAB in the last 72 hours. Anemia Panel: No results for input(s): VITAMINB12, FOLATE, FERRITIN, TIBC, IRON, RETICCTPCT in the last 72 hours. Sepsis Labs: Recent Labs  Lab 02/21/21 1141 02/24/21 0920  PROCALCITON 0.95 0.24    Recent Results (from the past 240 hour(s))  Resp Panel by RT-PCR (Flu A&B, Covid) Nasopharyngeal Swab     Status: None   Collection Time: 02/18/21  9:15 PM   Specimen: Nasopharyngeal Swab; Nasopharyngeal(NP) swabs in vial transport medium  Result  Value Ref Range Status   SARS Coronavirus 2 by RT PCR NEGATIVE NEGATIVE Final    Comment: (NOTE) SARS-CoV-2 target nucleic acids are NOT DETECTED.  The SARS-CoV-2 RNA is generally detectable in upper respiratory specimens during the acute phase of infection. The lowest concentration of SARS-CoV-2 viral copies this assay can detect is 138 copies/mL. A negative result does not preclude SARS-Cov-2 infection and should not be used as the sole basis for treatment or other patient management decisions. A negative result may occur with  improper specimen collection/handling, submission of specimen other than nasopharyngeal swab, presence of viral mutation(s) within the areas targeted by this assay, and inadequate number of viral copies(<138 copies/mL). A negative result must be combined with clinical observations, patient history, and epidemiological information. The expected result is Negative.  Fact Sheet for Patients:  EntrepreneurPulse.com.au  Fact Sheet for Healthcare Providers:  IncredibleEmployment.be  This test is no t yet approved or cleared by the [place] FDA and  has been authorized for detection and/or diagnosis of SARS-CoV-2 by FDA under an Emergency Use Authorization (EUA). This EUA will remain  in effect (meaning this test can be used) for the duration of the COVID-19 declaration under Section 564(b)(1) of the Act, 21 U.S.C.section 360bbb-3(b)(1), unless the authorization is terminated  or revoked sooner.       Influenza A by PCR NEGATIVE NEGATIVE Final   Influenza B by PCR NEGATIVE NEGATIVE Final    Comment: (NOTE) The Xpert Xpress SARS-CoV-2/FLU/RSV plus assay is intended as an aid in the diagnosis of influenza from Nasopharyngeal swab specimens and should not be used as a sole basis for treatment. Nasal washings and aspirates are unacceptable for Xpert Xpress SARS-CoV-2/FLU/RSV testing.  Fact Sheet for  Patients: EntrepreneurPulse.com.au  Fact Sheet for Healthcare Providers: IncredibleEmployment.be  This test is not yet approved or cleared by the [place] FDA and has been authorized for detection and/or diagnosis of SARS-CoV-2 by FDA under an Emergency Use Authorization (EUA). This EUA will remain in effect (meaning this test can be used) for the duration of the COVID-19 declaration under Section 564(b)(1) of the Act, 21 U.S.C. section 360bbb-3(b)(1), unless  the authorization is terminated or revoked.  Performed at [facility], [place] [address] [place], [place] [address]   Blood culture (routine x 2)     Status: None   Collection Time: 02/18/21  9:35 PM   Specimen: BLOOD RIGHT HAND  Result Value Ref Range Status   Specimen Description   Final    BLOOD RIGHT HAND Performed at [facility] [address] [place], [place] [address]    Special Requests   Final    BOTTLES DRAWN AEROBIC AND ANAEROBIC Blood Culture results may not be optimal due to an inadequate volume of blood received in culture bottles Performed at [facility] [address], [place], [place] [address]    Culture   Final    NO GROWTH 5 DAYS Performed at [facility], [place] [address], [place], [place] [address]    Report Status 02/24/2021 FINAL  Final  Blood culture (routine x 2)     Status: None   Collection Time: 02/18/21  9:35 PM   Specimen: BLOOD  Result Value Ref Range Status   Specimen Description   Final    BLOOD RIGHT ANTECUBITAL Performed at [facility] [address], [place], [place] [address]    Special Requests   Final    BOTTLES DRAWN AEROBIC AND ANAEROBIC Blood Culture results may not be optimal due to an inadequate volume of blood received in culture bottles Performed at [name] [address] [place], [place] [address]    Culture   Final     NO GROWTH 5 DAYS Performed at [facility], [place] [address] [place], [place] [address]    Report Status 02/24/2021 FINAL  Final      Radiology Studies: No results found.    Scheduled Meds: . furosemide  40 mg Oral BID  . gabapentin  400 mg Oral Q breakfast  . gabapentin  800 mg Oral QHS  . guaiFENesin  600 mg Oral BID  . insulin aspart  0-6 Units Subcutaneous TID WC  . levothyroxine  25 mcg Oral Q0600  . metoprolol succinate  50 mg Oral BID  . sacubitril-valsartan  1 tablet Oral BID  . sodium chloride flush  3 mL Intravenous Q12H  . warfarin  5 mg Oral ONCE-1600  . Warfarin - Pharmacist Dosing Inpatient   Does not apply q1600   Continuous Infusions: . sodium chloride 250 mL (02/23/21 2135)     LOS: 7 days      Time spent: 25 minutes   Dessa Phi, DO [facility] 02/26/2021, 12:25 PM   Available via Epic secure chat 7am-7pm After these hours, please refer to coverage provider listed on [email or web address]

## 2021-02-26 NOTE — NC FL2 (Signed)
Natasha Livingston     IDENTIFICATION  Patient Name: Natasha Livingston Birthdate: 1934-08-06 Sex: female Admission Date (Current Location): 02/18/2021  [facility] and [place] Number:  Natasha Livingston:  Natasha Livingston,  Natasha Livingston, Natasha Livingston      Provider Number: [id number]  Attending Physician Name and Livingston:  Dessa Phi, DO  Relative Name and Phone Number:  Natasha Livingston,Natasha Livingston Son   [phone]  Natasha Livingston, Natasha Livingston [phone]    Current Level of Care: Living[place] Recommended Level of Care: [place] Prior Approval Number:    Date Approved/Denied:   PASRR Number: [id number]  Discharge Plan: SNF    Current Diagnoses: Patient Active Problem List   Diagnosis Date Noted  . CAP (community acquired pneumonia) 02/19/2021  . Chronic combined systolic and diastolic CHF (congestive heart failure) ([place]) - echo 02-2018 LVEF 35-40% 02/18/2021  . Hyperglycemia 02/18/2021  . Onychomycosis of left great toe 09/10/2015  . Encounter for therapeutic drug monitoring 12/07/2013  . Depression 12/07/2013  . Paroxysmal atrial fibrillation ([place]) 08/18/2013  . Hypothyroid 04/20/2013  . Dermatitis 12/31/2012  . Benign hypertensive heart disease without heart failure 12/24/2011  . Long term (current) use of anticoagulants - on coumadin for afib 08/27/2011  . Edema of foot 04/28/2011  . Malaise and fatigue 04/15/2011  . Chest discomfort   . Dizziness   . MVP (mitral valve prolapse)   . Right upper lobe pneumonia   . Bruises easily   . Back pain   . Forgetfulness   . Atrial fibrillation ([place])   . Hypertension   . Hypothyroidism   . Aortic stenosis   . Hyperlipidemia   . Diverticulitis   . Interstitial nephritis   . Neuropathy   . Ischemic heart disease   . Atrial fibrillation ([place]) 02/03/2011    Orientation RESPIRATION BLADDER Height & Weight     Self,Time,Situation  Normal  Continent Weight: 148 lb 5.9 oz (67.3 kg) Height:  5\' 7"  (170.2 cm)  BEHAVIORAL SYMPTOMS/MOOD NEUROLOGICAL BOWEL NUTRITION STATUS      Continent Diet (Cardiac)  AMBULATORY STATUS COMMUNICATION OF NEEDS Skin   Limited Assist Verbally Normal                       Personal Care Assistance Level of Assistance  Bathing,Dressing,Feeding Bathing Assistance: Limited assistance Feeding assistance: Independent Dressing Assistance: Limited assistance     Functional Limitations Info  Sight,Hearing,Speech Sight Info: Adequate Hearing Info: Adequate Speech Info: Adequate    SPECIAL CARE FACTORS FREQUENCY  PT (By licensed PT),OT (By licensed OT)     PT Frequency: Minimum 5 x a week OT Frequency: Minimum 5x a week            Contractures Contractures Info: Not present    Additional Factors Info  Insulin Sliding Scale       Insulin Sliding Scale Info: insulin aspart (novoLOG) injection 0-6 Units 3x a day with meals       Current Medications (02/26/2021):  This is the current Livingston active medication list Current Facility-Administered Medications  Medication Dose Route Frequency Provider Last Rate Last Admin  . 0.9 %  sodium chloride infusion  250 mL Intravenous PRN Kristopher Oppenheim, DO 10 mL/hr at 02/23/21 2135 250 mL at 02/23/21 2135  . acetaminophen (TYLENOL) tablet 650 mg  650 mg Oral Q6H PRN Kristopher Oppenheim, DO   650 mg at 02/24/21 2159   Or  .  acetaminophen (TYLENOL) suppository 650 mg  650 mg Rectal Q6H PRN Kristopher Oppenheim, DO      . albuterol (PROVENTIL) (2.5 MG/3ML) 0.083% nebulizer solution 2.5 mg  2.5 mg Nebulization Q2H PRN Kristopher Oppenheim, DO   2.5 mg at 02/19/21 1343  . furosemide (LASIX) tablet 40 mg  40 mg Oral BID Dessa Phi, DO   40 mg at 02/26/21 0956  . gabapentin (NEURONTIN) capsule 400 mg  400 mg Oral Q breakfast Kristopher Oppenheim, DO   400 mg at 02/26/21 0956  . gabapentin (NEURONTIN) capsule 800 mg  800 mg Oral QHS Kristopher Oppenheim, DO   800 mg at 02/25/21 2148  . guaiFENesin  (MUCINEX) 12 hr tablet 600 mg  600 mg Oral BID Kristopher Oppenheim, DO   600 mg at 02/26/21 0956  . insulin aspart (novoLOG) injection 0-6 Units  0-6 Units Subcutaneous TID WC Shawna Clamp, MD      . levothyroxine (SYNTHROID) tablet 25 mcg  25 mcg Oral Q0600 Kristopher Oppenheim, DO   25 mcg at 02/26/21 0502  . metoprolol succinate (TOPROL-XL) 24 hr tablet 50 mg  50 mg Oral BID Kristopher Oppenheim, DO   50 mg at 02/26/21 0956  . phenol (CHLORASEPTIC) mouth spray 1 spray  1 spray Mouth/Throat PRN Shawna Clamp, MD   1 spray at 02/20/21 2336  . sacubitril-valsartan (ENTRESTO) 24-26 mg per tablet  1 tablet Oral BID Kristopher Oppenheim, DO   1 tablet at 02/26/21 [phone]  . sodium chloride flush (NS) 0.9 % injection 3 mL  3 mL Intravenous Q12H Kristopher Oppenheim, DO   3 mL at 02/26/21 0956  . sodium chloride flush (NS) 0.9 % injection 3 mL  3 mL Intravenous PRN Kristopher Oppenheim, DO      . warfarin (COUMADIN) tablet 5 mg  5 mg Oral ONCE-1600 [place], Cindie Laroche, RPH      . Warfarin - Pharmacist Dosing Inpatient   Does not apply [place], [facility]         Discharge Medications: Please see discharge summary for a list of discharge medications.  Relevant Imaging Results:  Relevant Lab Results:   Additional Information SSN [id number]  Natasha Ludwig, LCSW

## 2021-02-26 NOTE — Telephone Encounter (Signed)
New Message:    Pt's son is calling to say that pt is in the hospital at [facility]. His concern is that her INR is 1.5. She was admitted for Pneumonia. He would like for you to call, he have some questions.i.

## 2021-02-26 NOTE — Plan of Care (Signed)

## 2021-02-26 NOTE — TOC Progression Note (Addendum)
Transition of Care [facility]) - Progression Note    Patient Details  Name: Natasha Livingston MRN: [id number] Date of Birth: 03-25-1934  [facility] CM/SW Contact  Ross Ludwig, [place] Phone Number: 02/26/2021, 5:10 PM  Clinical Narrative:     CSW spoke to patient's son Richardson Landry, and [facility] spoke to [place].  CSW presented bed offers, and family chose [facility].  CSW to continue to follow for discharge planning.  CSW spoke to [place] at [facility] and she said depending on availability they should be able to accept patient either Wednesday or Thursday.   5:00pm CSW spoke to patient's other son Gerald Stabs and discussed SNF offer of [facility].  CSW explained how insurance will pay for it and what to expect at SNF.  CSW explained that patient will have to have another Covid Swab before she is able to go.  Patient's son expressed understanding.  CSW also confirmed that patient has been vaccinated and Boosted from Covid.  CSW to continue to facilitate discharge planning.  Expected Discharge Plan: [place] Barriers to Discharge: Continued Medical Work up  Expected Discharge Plan and Services Expected Discharge Plan: [place]   Discharge Planning Services: CM Consult Post Acute Care Choice: [place] arrangements for the past 2 months: Single Family Home                                       Social Determinants of Health (SDOH) Interventions    Readmission Risk Interventions No flowsheet data found.

## 2021-02-27 DIAGNOSIS — I480 Paroxysmal atrial fibrillation: Secondary | ICD-10-CM | POA: Diagnosis not present

## 2021-02-27 DIAGNOSIS — Z7901 Long term (current) use of anticoagulants: Secondary | ICD-10-CM | POA: Diagnosis not present

## 2021-02-27 DIAGNOSIS — R52 Pain, unspecified: Secondary | ICD-10-CM | POA: Diagnosis not present

## 2021-02-27 DIAGNOSIS — I482 Chronic atrial fibrillation, unspecified: Secondary | ICD-10-CM | POA: Diagnosis not present

## 2021-02-27 DIAGNOSIS — R739 Hyperglycemia, unspecified: Secondary | ICD-10-CM | POA: Diagnosis not present

## 2021-02-27 DIAGNOSIS — I2510 Atherosclerotic heart disease of native coronary artery without angina pectoris: Secondary | ICD-10-CM | POA: Diagnosis not present

## 2021-02-27 DIAGNOSIS — G629 Polyneuropathy, unspecified: Secondary | ICD-10-CM | POA: Diagnosis not present

## 2021-02-27 DIAGNOSIS — M5459 Other low back pain: Secondary | ICD-10-CM | POA: Diagnosis not present

## 2021-02-27 DIAGNOSIS — I5042 Chronic combined systolic (congestive) and diastolic (congestive) heart failure: Secondary | ICD-10-CM | POA: Diagnosis not present

## 2021-02-27 DIAGNOSIS — R079 Chest pain, unspecified: Secondary | ICD-10-CM | POA: Diagnosis not present

## 2021-02-27 DIAGNOSIS — E1140 Type 2 diabetes mellitus with diabetic neuropathy, unspecified: Secondary | ICD-10-CM | POA: Diagnosis not present

## 2021-02-27 DIAGNOSIS — J302 Other seasonal allergic rhinitis: Secondary | ICD-10-CM | POA: Diagnosis not present

## 2021-02-27 DIAGNOSIS — I10 Essential (primary) hypertension: Secondary | ICD-10-CM | POA: Diagnosis not present

## 2021-02-27 DIAGNOSIS — I341 Nonrheumatic mitral (valve) prolapse: Secondary | ICD-10-CM | POA: Diagnosis not present

## 2021-02-27 DIAGNOSIS — M109 Gout, unspecified: Secondary | ICD-10-CM | POA: Diagnosis not present

## 2021-02-27 DIAGNOSIS — G8929 Other chronic pain: Secondary | ICD-10-CM | POA: Diagnosis not present

## 2021-02-27 DIAGNOSIS — K5790 Diverticulosis of intestine, part unspecified, without perforation or abscess without bleeding: Secondary | ICD-10-CM | POA: Diagnosis not present

## 2021-02-27 DIAGNOSIS — I110 Hypertensive heart disease with heart failure: Secondary | ICD-10-CM | POA: Diagnosis not present

## 2021-02-27 DIAGNOSIS — E1142 Type 2 diabetes mellitus with diabetic polyneuropathy: Secondary | ICD-10-CM | POA: Diagnosis not present

## 2021-02-27 DIAGNOSIS — N12 Tubulo-interstitial nephritis, not specified as acute or chronic: Secondary | ICD-10-CM | POA: Diagnosis not present

## 2021-02-27 DIAGNOSIS — E039 Hypothyroidism, unspecified: Secondary | ICD-10-CM | POA: Diagnosis not present

## 2021-02-27 DIAGNOSIS — J189 Pneumonia, unspecified organism: Secondary | ICD-10-CM | POA: Diagnosis not present

## 2021-02-27 DIAGNOSIS — I5041 Acute combined systolic (congestive) and diastolic (congestive) heart failure: Secondary | ICD-10-CM | POA: Diagnosis not present

## 2021-02-27 LAB — BASIC METABOLIC PANEL
Anion gap: 11 (ref 5–15)
BUN: 19 mg/dL (ref 8–23)
CO2: 23 mmol/L (ref 22–32)
Calcium: 8.8 mg/dL — ABNORMAL LOW (ref 8.9–10.3)
Chloride: 101 mmol/L (ref 98–111)
Creatinine, Ser: 0.58 mg/dL (ref 0.44–1.00)
GFR, Estimated: >60 mL/min (ref >60)
Glucose, Bld: 116 mg/dL — ABNORMAL HIGH (ref 70–99)
Potassium: 3.5 mmol/L (ref 3.5–5.1)
Sodium: 135 mmol/L (ref 135–145)

## 2021-02-27 LAB — CBC
HCT: 38.1 % (ref 36.0–46.0)
Hemoglobin: 12.7 g/dL (ref 12.0–15.0)
MCH: 33.3 pg (ref 26.0–34.0)
MCHC: 33.3 g/dL (ref 30.0–36.0)
MCV: 100 fL (ref 80.0–100.0)
Platelets: 267 10*3/uL (ref 150–400)
RBC: 3.81 MIL/uL — ABNORMAL LOW (ref 3.87–5.11)
RDW: 15.6 % — ABNORMAL HIGH (ref 11.5–15.5)
WBC: 8.4 10*3/uL (ref 4.0–10.5)
nRBC: 0 % (ref 0.0–0.2)

## 2021-02-27 LAB — GLUCOSE, CAPILLARY
Glucose-Capillary: 126 mg/dL — ABNORMAL HIGH (ref 70–99)
Glucose-Capillary: 126 mg/dL — ABNORMAL HIGH (ref 70–99)

## 2021-02-27 LAB — PROTIME-INR
INR: 2 — ABNORMAL HIGH (ref 0.8–1.2)
Prothrombin Time: 22.6 seconds — ABNORMAL HIGH (ref 11.4–15.2)

## 2021-02-27 LAB — SARS CORONAVIRUS 2 (TAT 6-24 HRS): SARS Coronavirus 2: NEGATIVE

## 2021-02-27 MED ORDER — WARFARIN SODIUM 5 MG PO TABS: 5.0000 mg | ORAL_TABLET | Freq: Once | ORAL | Status: DC

## 2021-02-27 MED ORDER — INSULIN ASPART 100 UNIT/ML ~~LOC~~ SOLN: 1.0000 [IU] | Freq: Three times a day (TID) | SUBCUTANEOUS | 11 refills | Status: DC

## 2021-02-27 NOTE — Plan of Care (Signed)

## 2021-02-27 NOTE — Progress Notes (Signed)
[place] for Coumadin Indication: atrial fibrillation  Allergies  Allergen Reactions  . Amiodarone Other (See Comments)    unknown  . Crestor [Rosuvastatin Calcium] Other (See Comments)    unknown  . Lipitor [Atorvastatin Calcium] Swelling    Patient Measurements: Height: 5\' 7"  (170.2 cm) Weight: 65.1 kg (143 lb 8.3 oz) IBW/kg (Calculated) : 61.6  Vital Signs: Temp: 98 F (36.7 C) (04/27 0551) Temp Source: Oral (04/26 2111) BP: 128/99 (04/27 0551) Pulse Rate: 71 (04/27 0551)  Labs: Recent Labs    02/25/21 0624 02/26/21 0510 02/27/21 0446  HGB 12.3  --  12.7  HCT 37.2  --  38.1  PLT 243  --  267  LABPROT 17.8* 20.1* 22.6*  INR 1.5* 1.7* 2.0*  CREATININE 0.86  --  0.58    Estimated Creatinine Clearance: 50 mL/min (by C-G formula based on SCr of 0.58 mg/dL).   Medications:  Warfarin PTA- 5mg  daily except 2.5mg  on Tues/Thur/Sat.  LD 4/18 @ 10a  Assessment: [name] with hx of afib on Coumadin.  INR was SUPRAtherapeutic on admission at 5.5.  Elevated INR likely due to acute illness & poor oral intake for past several days. Rocephin + Zithromax given in ED which can also interact to elevate INR.    Of note, patient has been on current outpatient regimen since Dec 2021 with mostly therapeutic INR.    02/27/21 7:31 AM  INR now therapeutic completed course of azithromcin 4/23 Hgb now WNL, Plt stable WNL no bleeding reported  Goal of Therapy:  INR 2-3   Plan:   Warfarin 5 mg today (if not discharged)  Daily PT/INR  Monitor closely for s/sx of bleeding  For discharge, recommend resume PTA regimen  Napoleon Form  02/27/2021, 7:31 AM

## 2021-02-27 NOTE — TOC Progression Note (Addendum)
Transition of Care [facility]) - Progression Note    Patient Details  Name: Belen Pesch MRN: [id number] Date of Birth: 10/11/1934  [facility]) CM/SW Contact  Tiffanny Lamar[place], Juliann Pulse, RN Phone Number: 02/27/2021, 12:10 PM  Clinical Narrative: Damaris Schooner to [facility] rep Tracey-will check on bed acceptance, rm#,nsg tel# for report today-await call back. Awaiting covid results also. Family can transport to [facility] on own per Rep Linus Orn.  2:37p-confirmed w/[facility]-patient going to rm#206, nsg call report tel#[phone] ask for nurse covering.Calling son Gerald Stabs to inform of rm#206-he will transport on own-[facility] aware & agree. No further CM needs. 3:50p-Updated d/c summary for [facility]-sent via hub.No further CM needs.    Expected Discharge Plan: [place] Barriers to Discharge: No Barriers Identified  Expected Discharge Plan and Services Expected Discharge Plan: [place]   Discharge Planning Services: CM Consult Post Acute Care Choice: [place] arrangements for the past 2 months: Single Family Home Expected Discharge Date: 02/27/21                                     Social Determinants of Health (SDOH) Interventions    Readmission Risk Interventions No flowsheet data found.

## 2021-02-27 NOTE — Discharge Summary (Addendum)
Physician Discharge Summary   Natasha Livingston [id number] DOB: [birth year] DOA: 02/18/2021  PCP: Cyndi Bender, PA-C  Admit date: 02/18/2021 Discharge date:  02/27/2021  Admitted From: Home Disposition:  [place] Discharging physician: Dwyane Dee, MD  Recommendations for Outpatient Follow-up:  1. Started on SSI due to increased A1c from prior. Now 7.5% 2. INR 2 at discharge. Repeat INR in 2-3 days 3. Repeat CXR in 3-4 weeks for PNA resolution    Patient discharged to [facility] in Discharge Condition: stable Risk of unplanned readmission score: Unplanned Admission- Pilot do not use: 15.39  CODE STATUS: Full Diet recommendation:  Diet Orders (From admission, onward)    Start     Ordered   02/27/21 0000  Diet Carb Modified        02/27/21 1149   02/19/21 0053  Diet Heart Room service appropriate? Yes; Fluid consistency: Thin  Diet effective now       Question Answer Comment  Room service appropriate? Yes   Fluid consistency: Thin      02/19/21 0052          Hospital Course: Natasha Livingston is an 87 years old female with history of atrial fibrillation on Coumadin, hypertension, chronic systolic heart failure with last known EF in 2019 of 35%, neuropathy, who presented in the ED with chief complaint of cough and shortness of breath for 3 days. Patient reported coughing up yellow phlegm in the ED, chest x-ray showed possible right upper lobe infiltrate. Patient was diagnosed with pneumonia and started on IV antibiotics (Rocephin and Zithromax). She completed abx inpatient.  See below for further problem based plan.   Community-acquired pneumonia; Patient presented with cough and shortness of breath.  CT chest without contrast shows right upper and lower lobe peribronchovascular groundglass and consolidative airspace opacities suggestive of infection/inflammation. Patient started on Rocephin and Zithromax for 5 days.  - Follow-up  chest x-ray in 3 to 4 weeksrecommended to ensure complete resolution of findings  Paroxysmal atrial fibrillation: Continue Coumadin, metoprolol - INR 2 at discharge  Chronic systolic/diastolic heart failure: Last echocardiogram from 2019 showed EF of 35 to 40%. BNP 315.4 Does not appear to be in fluid overload at this time. Currently euvolemic.  Continue Entresto, lasix.  Hypertension: Continue metoprolol, sacubitril/valsartan, Lasix  Hypothyroidism: Continue Synthroid  Peripheral neuropathy: Continue Neurontin  DM II Hb A1c 7.4% Sliding scale insulin   Principal Diagnosis: Right upper lobe pneumonia  Discharge Diagnoses: Active Hospital Problems   Diagnosis Date Noted  . Right upper lobe pneumonia   . CAP (community acquired pneumonia) 02/19/2021  . Chronic combined systolic and diastolic CHF (congestive heart failure) ([place]) - echo 02-2018 LVEF 35-40% 02/18/2021  . Hyperglycemia 02/18/2021  . Paroxysmal atrial fibrillation ([place]) 08/18/2013  . Long term (current) use of anticoagulants - on coumadin for afib 08/27/2011  . Hypertension   . Neuropathy     Resolved Hospital Problems  No resolved problems to display.    Discharge Instructions    Diet Carb Modified   Complete by: As directed    Increase activity slowly   Complete by: As directed      Allergies as of 02/27/2021      Reactions   Amiodarone Other (See Comments)   unknown   Crestor [rosuvastatin Calcium] Other (See Comments)   unknown   Lipitor [atorvastatin Calcium] Swelling      Medication List    TAKE these medications   acetaminophen 500  MG tablet Commonly known as: TYLENOL Take 500 mg by mouth every 6 (six) hours as needed for moderate pain (For pain.).   allopurinol 300 MG tablet Commonly known as: ZYLOPRIM Take 300 mg by mouth daily.   colchicine 0.6 MG tablet Take 0.6 mg by mouth daily as needed (For gout flare-ups.).   Entresto 24-26 MG Generic drug:  sacubitril-valsartan TAKE 1 TABLET BY MOUTH TWICE A DAY   furosemide 40 MG tablet Commonly known as: LASIX Take 1 tablet (40 mg total) by mouth 2 (two) times daily.   gabapentin 800 MG tablet Commonly known as: NEURONTIN Take 400-800 mg by mouth See admin instructions. Take one half tablet (400 mg) am take one tablet (800 mg) pm   insulin aspart 100 UNIT/ML injection Commonly known as: novoLOG Inject 1-6 Units into the skin 3 (three) times daily with meals. CBG 151 - 200: 1 unit. CBG 201-250: 2 units. CBG 251-300: 3 units. CBG 301-350: 4 units. CBG 351-400: 5 units. CBG > 400, Give 6 units and call MD   Klor-Con M20 20 MEQ tablet Generic drug: potassium chloride SA TAKE 2 TABLETS BY MOUTH EVERY DAY What changed:   how much to take  when to take this   levothyroxine 25 MCG tablet Commonly known as: SYNTHROID Take 25 mcg by mouth daily before breakfast.   metoprolol succinate 50 MG 24 hr tablet Commonly known as: TOPROL-XL TAKE 1 TABLET BY MOUTH TWICE A DAY WITH OR IMMEDIATELY FOLLOWING A MEAL What changed: See the new instructions.   nitroGLYCERIN 0.4 MG SL tablet Commonly known as: NITROSTAT Place 1 tablet (0.4 mg total) under the tongue every 5 (five) minutes as needed for chest pain.   warfarin 5 MG tablet Commonly known as: COUMADIN Take as directed. If you are unsure how to take this medication, talk to your nurse or doctor. Original instructions: TAKE AS DIRECTED BY COUMADIN CLINIC What changed: See the new instructions.       Allergies  Allergen Reactions  . Amiodarone Other (See Comments)    unknown  . Crestor [Rosuvastatin Calcium] Other (See Comments)    unknown  . Lipitor [Atorvastatin Calcium] Swelling    Discharge Exam: BP (!) 128/99 (BP Location: Right Arm)   Pulse 71   Temp 98 F (36.7 C)   Resp 20   Ht 5\' 7"  (1.702 m)   Wt 65.1 kg   SpO2 98%   BMI 22.48 kg/m  General appearance: alert, cooperative and no distress Head: Normocephalic,  without obvious abnormality, atraumatic Eyes: EOMI Lungs: clear to auscultation bilaterally Heart: irregularly irregular rhythm and S1, S2 normal Abdomen: normal findings: bowel sounds normal and soft, non-tender Extremities: no edema Skin: mobility and turgor normal Neurologic: Grossly normal  The results of significant diagnostics from this hospitalization (including imaging, microbiology, ancillary and laboratory) are listed below for reference.   Microbiology: Recent Results (from the past 240 hour(s))  Resp Panel by RT-PCR (Flu A&B, Covid) Nasopharyngeal Swab     Status: None   Collection Time: 02/18/21  9:15 PM   Specimen: Nasopharyngeal Swab; Nasopharyngeal(NP) swabs in vial transport medium  Result Value Ref Range Status   SARS Coronavirus 2 by RT PCR NEGATIVE NEGATIVE Final    Comment: (NOTE) SARS-CoV-2 target nucleic acids are NOT DETECTED.  The SARS-CoV-2 RNA is generally detectable in upper respiratory specimens during the acute phase of infection. The lowest concentration of SARS-CoV-2 viral copies this assay can detect is 138 copies/mL. A negative result does not preclude  SARS-Cov-2 infection and should not be used as the sole basis for treatment or other patient management decisions. A negative result may occur with  improper specimen collection/handling, submission of specimen other than nasopharyngeal swab, presence of viral mutation(s) within the areas targeted by this assay, and inadequate number of viral copies(<138 copies/mL). A negative result must be combined with clinical observations, patient history, and epidemiological information. The expected result is Negative.  Fact Sheet for Patients:  EntrepreneurPulse.com.au  Fact Sheet for Healthcare Providers:  IncredibleEmployment.be  This test is no t yet approved or cleared by the [place] FDA and  has been authorized for detection and/or diagnosis of SARS-CoV-2  by FDA under an Emergency Use Authorization (EUA). This EUA will remain  in effect (meaning this test can be used) for the duration of the COVID-19 declaration under Section 564(b)(1) of the Act, 21 U.S.C.section 360bbb-3(b)(1), unless the authorization is terminated  or revoked sooner.       Influenza A by PCR NEGATIVE NEGATIVE Final   Influenza B by PCR NEGATIVE NEGATIVE Final    Comment: (NOTE) The Xpert Xpress SARS-CoV-2/FLU/RSV plus assay is intended as an aid in the diagnosis of influenza from Nasopharyngeal swab specimens and should not be used as a sole basis for treatment. Nasal washings and aspirates are unacceptable for Xpert Xpress SARS-CoV-2/FLU/RSV testing.  Fact Sheet for Patients: EntrepreneurPulse.com.au  Fact Sheet for Healthcare Providers: IncredibleEmployment.be  This test is not yet approved or cleared by the [place] FDA and has been authorized for detection and/or diagnosis of SARS-CoV-2 by FDA under an Emergency Use Authorization (EUA). This EUA will remain in effect (meaning this test can be used) for the duration of the COVID-19 declaration under Section 564(b)(1) of the Act, 21 U.S.C. section 360bbb-3(b)(1), unless the authorization is terminated or revoked.  Performed at [facility], [place] [address] [place], [place] [address]   Blood culture (routine x 2)     Status: None   Collection Time: 02/18/21  9:35 PM   Specimen: BLOOD RIGHT HAND  Result Value Ref Range Status   Specimen Description   Final    BLOOD RIGHT HAND Performed at [facility] [address], [place], [place] [address]    Special Requests   Final    BOTTLES DRAWN AEROBIC AND ANAEROBIC Blood Culture results may not be optimal due to an inadequate volume of blood received in culture bottles Performed at [facility] [address], [place], [place] [address]    Culture   Final     NO GROWTH 5 DAYS Performed at [facility], [place] [address] [place], [place] [address]    Report Status 02/24/2021 FINAL  Final  Blood culture (routine x 2)     Status: None   Collection Time: 02/18/21  9:35 PM   Specimen: BLOOD  Result Value Ref Range Status   Specimen Description   Final    BLOOD RIGHT ANTECUBITAL Performed at [facility] [address], [place], [place] [address]    Special Requests   Final    BOTTLES DRAWN AEROBIC AND ANAEROBIC Blood Culture results may not be optimal due to an inadequate volume of blood received in culture bottles Performed at [facility] [address] [place], [place] [address]    Culture   Final    NO GROWTH 5 DAYS Performed at [facility], [place] [address] [place], [place] [address]  Report Status 02/24/2021 FINAL  Final     Labs: BNP (last 3 results) Recent Labs    02/18/21 1833 02/23/21 0741  BNP 458.2* 841.6*   Basic Metabolic Panel: Recent Labs  Lab 02/21/21 0457 02/22/21 0454 02/23/21 0448 02/25/21 0624 02/27/21 0446  NA 136 135 135 134* 135  K 3.1* 4.3 4.1 3.5 3.5  CL 104 103 100 100 101  CO2 22 22 24 26 23   GLUCOSE 146* 154* 104* 123* 116*  BUN 20 21 22 19 19   CREATININE 0.75 0.86 0.84 0.86 0.58  CALCIUM 8.9 9.4 9.0 8.8* 8.8*  MG  --   --  2.2 2.3  --   PHOS  --   --  2.8 2.9  --    Liver Function Tests: No results for input(s): AST, ALT, ALKPHOS, BILITOT, PROT, ALBUMIN in the last 168 hours. No results for input(s): LIPASE, AMYLASE in the last 168 hours. No results for input(s): AMMONIA in the last 168 hours. CBC: Recent Labs  Lab 02/22/21 0454 02/23/21 0448 02/24/21 0717 02/25/21 0624 02/27/21 0446  WBC 11.4* 8.5 9.7 7.7 8.4  HGB 13.1 14.8 13.0 12.3 12.7  HCT 39.9 44.0 39.6 37.2 38.1  MCV 101.8* 98.4 100.5* 99.5 100.0  PLT 254 192 233 243 267   Cardiac Enzymes: No results for input(s): CKTOTAL, CKMB, CKMBINDEX, TROPONINI in the last 168  hours. BNP: Invalid input(s): POCBNP CBG: Recent Labs  Lab 02/26/21 0733 02/26/21 1115 02/26/21 1613 02/26/21 1941 02/27/21 0730  GLUCAP 107* 138* 118* 218* 126*   D-Dimer No results for input(s): DDIMER in the last 72 hours. Hgb A1c No results for input(s): HGBA1C in the last 72 hours. Lipid Profile No results for input(s): CHOL, HDL, LDLCALC, TRIG, CHOLHDL, LDLDIRECT in the last 72 hours. Thyroid function studies No results for input(s): TSH, T4TOTAL, T3FREE, THYROIDAB in the last 72 hours.  Invalid input(s): FREET3 Anemia work up No results for input(s): VITAMINB12, FOLATE, FERRITIN, TIBC, IRON, RETICCTPCT in the last 72 hours. Urinalysis    Component Value Date/Time   COLORURINE YELLOW 02/18/2021 2100   APPEARANCEUR CLEAR 02/18/2021 2100   LABSPEC 1.009 02/18/2021 2100   PHURINE 5.0 02/18/2021 2100   GLUCOSEU NEGATIVE 02/18/2021 2100   HGBUR SMALL (A) 02/18/2021 2100   BILIRUBINUR NEGATIVE 02/18/2021 2100   KETONESUR NEGATIVE 02/18/2021 2100   PROTEINUR NEGATIVE 02/18/2021 2100   UROBILINOGEN 0.2 04/16/2007 1109   NITRITE NEGATIVE 02/18/2021 2100   LEUKOCYTESUR NEGATIVE 02/18/2021 2100   Sepsis Labs Invalid input(s): PROCALCITONIN,  WBC,  LACTICIDVEN Microbiology Recent Results (from the past 240 hour(s))  Resp Panel by RT-PCR (Flu A&B, Covid) Nasopharyngeal Swab     Status: None   Collection Time: 02/18/21  9:15 PM   Specimen: Nasopharyngeal Swab; Nasopharyngeal(NP) swabs in vial transport medium  Result Value Ref Range Status   SARS Coronavirus 2 by RT PCR NEGATIVE NEGATIVE Final    Comment: (NOTE) SARS-CoV-2 target nucleic acids are NOT DETECTED.  The SARS-CoV-2 RNA is generally detectable in upper respiratory specimens during the acute phase of infection. The lowest concentration of SARS-CoV-2 viral copies this assay can detect is 138 copies/mL. A negative result does not preclude SARS-Cov-2 infection and should not be used as the sole basis for  treatment or other patient management decisions. A negative result may occur with  improper specimen collection/handling, submission of specimen other than nasopharyngeal swab, presence of viral mutation(s) within the areas targeted by this assay, and inadequate number of viral copies(<138 copies/mL). A negative  result must be combined with clinical observations, patient history, and epidemiological information. The expected result is Negative.  Fact Sheet for Patients:  EntrepreneurPulse.com.au  Fact Sheet for Healthcare Providers:  IncredibleEmployment.be  This test is no t yet approved or cleared by the [place] FDA and  has been authorized for detection and/or diagnosis of SARS-CoV-2 by FDA under an Emergency Use Authorization (EUA). This EUA will remain  in effect (meaning this test can be used) for the duration of the COVID-19 declaration under Section 564(b)(1) of the Act, 21 U.S.C.section 360bbb-3(b)(1), unless the authorization is terminated  or revoked sooner.       Influenza A by PCR NEGATIVE NEGATIVE Final   Influenza B by PCR NEGATIVE NEGATIVE Final    Comment: (NOTE) The Xpert Xpress SARS-CoV-2/FLU/RSV plus assay is intended as an aid in the diagnosis of influenza from Nasopharyngeal swab specimens and should not be used as a sole basis for treatment. Nasal washings and aspirates are unacceptable for Xpert Xpress SARS-CoV-2/FLU/RSV testing.  Fact Sheet for Patients: EntrepreneurPulse.com.au  Fact Sheet for Healthcare Providers: IncredibleEmployment.be  This test is not yet approved or cleared by the [place] FDA and has been authorized for detection and/or diagnosis of SARS-CoV-2 by FDA under an Emergency Use Authorization (EUA). This EUA will remain in effect (meaning this test can be used) for the duration of the COVID-19 declaration under Section 564(b)(1) of the Act, 21  U.S.C. section 360bbb-3(b)(1), unless the authorization is terminated or revoked.  Performed at [facility], [place] [address] [place], [place] [address]   Blood culture (routine x 2)     Status: None   Collection Time: 02/18/21  9:35 PM   Specimen: BLOOD RIGHT HAND  Result Value Ref Range Status   Specimen Description   Final    BLOOD RIGHT HAND Performed at [facility] [address] [place], [place] [address]    Special Requests   Final    BOTTLES DRAWN AEROBIC AND ANAEROBIC Blood Culture results may not be optimal due to an inadequate volume of blood received in culture bottles Performed at [facility] [address] [place], [place] [address]    Culture   Final    NO GROWTH 5 DAYS Performed at [facility], [place] [address] [place], [place] [address]    Report Status 02/24/2021 FINAL  Final  Blood culture (routine x 2)     Status: None   Collection Time: 02/18/21  9:35 PM   Specimen: BLOOD  Result Value Ref Range Status   Specimen Description   Final    BLOOD RIGHT ANTECUBITAL Performed at [facility] [address], [place], [place] [address]    Special Requests   Final    BOTTLES DRAWN AEROBIC AND ANAEROBIC Blood Culture results may not be optimal due to an inadequate volume of blood received in culture bottles Performed at [facility] [address] [place], [place] [address]    Culture   Final    NO GROWTH 5 DAYS Performed at [facility], [place] [address], [place], [place] [address]    Report Status 02/24/2021 FINAL  Final    Procedures/Studies: CT CHEST WO CONTRAST  Result Date: 02/18/2021 CLINICAL DATA:  Chest pain or SOB, pleurisy or effusion suspected EXAM: CT CHEST WITHOUT CONTRAST TECHNIQUE: Multidetector CT imaging of the chest was performed following the standard protocol without IV contrast. COMPARISON:  Chest x-ray 12/24/2011 FINDINGS:  Cardiovascular: Enlarged right atrium. No significant pericardial effusion. The thoracic aorta is normal in caliber. Moderate atherosclerotic plaque of the thoracic aorta. Four-vessel coronary artery calcifications. Enlarged main pulmonary artery. Mediastinum/Nodes: Question right hilar lymph lymphadenopathy in a, noting limited sensitivity for the detection of hilar adenopathy on this noncontrast study. Enlarged 1.3 cm precarinal lymph node. No axillary lymph nodes. Thyroid gland, trachea, and esophagus demonstrate no significant findings. Question tiny hiatal hernia. Lungs/Pleura: Expiratory phase of respiration. Right upper and lower lobes peribronchovascular ground-glass and consolidative airspace opacities. Associated interlobular septal wall thickening. Lingular linear atelectasis. Subsegmental atelectasis within left lower lobe. No pulmonary nodule noted within the aerated lungs. No pulmonary mass. Trace right pleural effusion. No definite left pleural effusion. No pneumothorax. Upper Abdomen: No acute abnormality. Musculoskeletal: No abdominal wall hernia or abnormality. No suspicious lytic or blastic osseous lesions. No acute displaced fracture. Multilevel degenerative changes of the spine. Status post sternotomy wires with some of the sternotomy wires that appear fracture. IMPRESSION: 1. Right upper and lower lobe pulmonary findings suggestive of infection/inflammation. Followup PA and lateral chest X-ray is recommended in 3-4 weeks following therapy to ensure resolution and exclude underlying malignancy. 2. Tracer right pleural effusion. 3. A 1.3 cm enlarged precarinal lymph node and question of right hilar lymphadenopathy with limited evaluation on this noncontrast study. Finding likely reactive in etiology. Attention on follow-up. 4. Enlarged main pulmonary artery consistent with pulmonary hypertension. 5. Enlarged right atrium. 6. Question tiny hiatal hernia. 7.  Aortic Atherosclerosis (ICD10-I70.0).  Electronically Signed   By: Iven Finn M.D.   On: 02/18/2021 22:55   DG CHEST PORT 1 VIEW  Result Date: 02/24/2021 CLINICAL DATA:  Pneumonia. EXAM: PORTABLE CHEST 1 VIEW COMPARISON:  February 23, 2021 FINDINGS: Cardiomegaly. The right upper lobe pneumonia persists but may be subtly improved in the interval. No other interval changes. No pneumothorax. IMPRESSION: 1. Right upper lobe pneumonia persists but may be subtly improved in the interval. Recommend follow-up to complete resolution. No other changes. Electronically Signed   By: Dorise Bullion III M.D   On: 02/24/2021 13:07   DG CHEST PORT 1 VIEW  Result Date: 02/23/2021 CLINICAL DATA:  Follow-up pneumonia EXAM: PORTABLE CHEST 1 VIEW COMPARISON:  02/18/2021 FINDINGS: Previous median sternotomy and CABG procedure. No pleural effusion or edema. Mild progression of right upper lobe airspace disease. No new findings. IMPRESSION: Mild progression of right upper lobe airspace disease. Electronically Signed   By: Kerby Moors M.D.   On: 02/23/2021 07:42   DG Chest Port 1 View  Result Date: 02/18/2021 CLINICAL DATA:  Weakness since Friday, dehydration, shortness of breath EXAM: PORTABLE CHEST 1 VIEW COMPARISON:  Portable exam 1835 hours compared to 09/21/2015 FINDINGS: Enlargement of cardiac silhouette post CABG. Mediastinal contours and pulmonary vascularity normal. RIGHT upper lobe infiltrate consistent with pneumonia. Minimal atelectasis or infiltrate at LEFT base. No pleural effusion or pneumothorax. Bones demineralized. IMPRESSION: RIGHT upper lobe infiltrate consistent with pneumonia. Mild atelectasis versus infiltrate LEFT lower lobe. Enlargement of cardiac silhouette post CABG. Electronically Signed   By: Lavonia Dana M.D.   On: 02/18/2021 19:01     Time coordinating discharge: Over 30 minutes    Dwyane Dee, MD  [facility] 02/27/2021, 11:49 AM

## 2021-02-27 NOTE — Progress Notes (Signed)
Inpatient Diabetes Program Recommendations  AACE/ADA: New Consensus Statement on Inpatient Glycemic Control (2015)  Target Ranges:  Prepandial:   less than 140 mg/dL      Peak postprandial:   less than 180 mg/dL (1-2 hours)      Critically ill patients:  140 - 180 mg/dL   Lab Results  Component Value Date   GLUCAP 126 (H) 02/27/2021   HGBA1C 7.5 (H) 02/24/2021    Review of Glycemic Control  Diabetes history: New onset DM Outpatient Diabetes medications: None Current orders for Inpatient glycemic control: Novolog 0-6 units TID with meals  HgbA1C - 7.5%  Inpatient Diabetes Program Recommendations:     Spoke with Ms Bonfanti's son and daughter-in-law about new diagnosis. They were wonderful. Great questions, and it appears she has been drinking regular Dr Samson Frederic and sweet tea recently. States she is willing to try diet sodas, Crystal Light tea, and family is very supportive.   Pt to check blood sugars every couple of days or when feeling silly, shaky or sweaty. Take blood sugar logbook to PCP for review.   Will need order for blood glucose meter kit (J#[id number])  Thank you. Lorenda Peck, RD, LDN, CDE Inpatient Diabetes Coordinator [phone]

## 2021-03-03 DIAGNOSIS — E039 Hypothyroidism, unspecified: Secondary | ICD-10-CM | POA: Diagnosis not present

## 2021-03-03 DIAGNOSIS — I110 Hypertensive heart disease with heart failure: Secondary | ICD-10-CM | POA: Diagnosis not present

## 2021-03-03 DIAGNOSIS — I5041 Acute combined systolic (congestive) and diastolic (congestive) heart failure: Secondary | ICD-10-CM | POA: Diagnosis not present

## 2021-03-03 DIAGNOSIS — I2510 Atherosclerotic heart disease of native coronary artery without angina pectoris: Secondary | ICD-10-CM | POA: Diagnosis not present

## 2021-03-03 DIAGNOSIS — Z7901 Long term (current) use of anticoagulants: Secondary | ICD-10-CM | POA: Diagnosis not present

## 2021-03-03 DIAGNOSIS — E1140 Type 2 diabetes mellitus with diabetic neuropathy, unspecified: Secondary | ICD-10-CM | POA: Diagnosis not present

## 2021-03-03 DIAGNOSIS — I482 Chronic atrial fibrillation, unspecified: Secondary | ICD-10-CM | POA: Diagnosis not present

## 2021-03-03 DIAGNOSIS — M109 Gout, unspecified: Secondary | ICD-10-CM | POA: Diagnosis not present

## 2021-03-05 ENCOUNTER — Other Ambulatory Visit: Payer: Self-pay | Admitting: *Deleted

## 2021-03-05 NOTE — Patient Outreach (Signed)
Member screened for potential [facility] . Per [place] (Patient Natasha Livingston) member resides in [facility].  Communication sent to facility SW to inquire about transition plans.  Will continue to follow for potential [facility] Care Management services.   Marthenia Rolling, MSN, RN,BSN [place] Acute Care Coordinator [phone] [facility]) [phone]  (Toll free office)

## 2021-03-11 ENCOUNTER — Other Ambulatory Visit: Payer: Self-pay | Admitting: *Deleted

## 2021-03-11 NOTE — Patient Outreach (Signed)
[facility] Post Acute Care Coordinator follow up. Member screened for potential [facility] Care Management needs.  [name] resides in [facility]. SNF SW reports transition plan is to return home with caregivers. States family is not interested in [place] Management services post SNF.     Natasha Rolling, MSN, RN,BSN [place] Acute Care Coordinator [phone] [facility]) [phone]  (Toll free office)

## 2021-03-14 ENCOUNTER — Ambulatory Visit (INDEPENDENT_AMBULATORY_CARE_PROVIDER_SITE_OTHER): Payer: Medicare Other

## 2021-03-14 ENCOUNTER — Other Ambulatory Visit: Payer: Self-pay

## 2021-03-14 DIAGNOSIS — Z5181 Encounter for therapeutic drug level monitoring: Secondary | ICD-10-CM | POA: Diagnosis not present

## 2021-03-14 DIAGNOSIS — I4891 Unspecified atrial fibrillation: Secondary | ICD-10-CM | POA: Diagnosis not present

## 2021-03-14 LAB — POCT INR: INR: 3 (ref 2.0–3.0)

## 2021-03-14 NOTE — Patient Instructions (Signed)
Description   Continue taking Warfarin 1 tablet daily except for 1/2 tablet Tuesdays, Thursdays, and Saturdays. Recheck INR in 2 weeks (usually 6 week recheck), checking sooner due to recent hospitalization and discharge from rehab yesterday.. Call our office if you have any questions or unusual bleeding [phone].

## 2021-03-17 DIAGNOSIS — M109 Gout, unspecified: Secondary | ICD-10-CM | POA: Diagnosis not present

## 2021-03-17 DIAGNOSIS — I700 Atherosclerosis of aorta: Secondary | ICD-10-CM | POA: Diagnosis not present

## 2021-03-17 DIAGNOSIS — I2510 Atherosclerotic heart disease of native coronary artery without angina pectoris: Secondary | ICD-10-CM | POA: Diagnosis not present

## 2021-03-17 DIAGNOSIS — K5790 Diverticulosis of intestine, part unspecified, without perforation or abscess without bleeding: Secondary | ICD-10-CM | POA: Diagnosis not present

## 2021-03-17 DIAGNOSIS — E1165 Type 2 diabetes mellitus with hyperglycemia: Secondary | ICD-10-CM | POA: Diagnosis not present

## 2021-03-17 DIAGNOSIS — E1142 Type 2 diabetes mellitus with diabetic polyneuropathy: Secondary | ICD-10-CM | POA: Diagnosis not present

## 2021-03-17 DIAGNOSIS — I5043 Acute on chronic combined systolic (congestive) and diastolic (congestive) heart failure: Secondary | ICD-10-CM | POA: Diagnosis not present

## 2021-03-17 DIAGNOSIS — Z7901 Long term (current) use of anticoagulants: Secondary | ICD-10-CM | POA: Diagnosis not present

## 2021-03-17 DIAGNOSIS — I110 Hypertensive heart disease with heart failure: Secondary | ICD-10-CM | POA: Diagnosis not present

## 2021-03-17 DIAGNOSIS — G8929 Other chronic pain: Secondary | ICD-10-CM | POA: Diagnosis not present

## 2021-03-17 DIAGNOSIS — J302 Other seasonal allergic rhinitis: Secondary | ICD-10-CM | POA: Diagnosis not present

## 2021-03-17 DIAGNOSIS — I341 Nonrheumatic mitral (valve) prolapse: Secondary | ICD-10-CM | POA: Diagnosis not present

## 2021-03-17 DIAGNOSIS — I480 Paroxysmal atrial fibrillation: Secondary | ICD-10-CM | POA: Diagnosis not present

## 2021-03-17 DIAGNOSIS — E039 Hypothyroidism, unspecified: Secondary | ICD-10-CM | POA: Diagnosis not present

## 2021-03-17 DIAGNOSIS — J188 Other pneumonia, unspecified organism: Secondary | ICD-10-CM | POA: Diagnosis not present

## 2021-03-18 DIAGNOSIS — Z6823 Body mass index (BMI) 23.0-23.9, adult: Secondary | ICD-10-CM | POA: Diagnosis not present

## 2021-03-18 DIAGNOSIS — R531 Weakness: Secondary | ICD-10-CM | POA: Diagnosis not present

## 2021-03-18 DIAGNOSIS — Z79899 Other long term (current) drug therapy: Secondary | ICD-10-CM | POA: Diagnosis not present

## 2021-03-18 DIAGNOSIS — G629 Polyneuropathy, unspecified: Secondary | ICD-10-CM | POA: Diagnosis not present

## 2021-03-18 DIAGNOSIS — J189 Pneumonia, unspecified organism: Secondary | ICD-10-CM | POA: Diagnosis not present

## 2021-03-18 DIAGNOSIS — M109 Gout, unspecified: Secondary | ICD-10-CM | POA: Diagnosis not present

## 2021-03-18 DIAGNOSIS — I4891 Unspecified atrial fibrillation: Secondary | ICD-10-CM | POA: Diagnosis not present

## 2021-03-18 DIAGNOSIS — I2510 Atherosclerotic heart disease of native coronary artery without angina pectoris: Secondary | ICD-10-CM | POA: Diagnosis not present

## 2021-03-18 DIAGNOSIS — E039 Hypothyroidism, unspecified: Secondary | ICD-10-CM | POA: Diagnosis not present

## 2021-03-18 DIAGNOSIS — E1159 Type 2 diabetes mellitus with other circulatory complications: Secondary | ICD-10-CM | POA: Diagnosis not present

## 2021-03-20 DIAGNOSIS — M109 Gout, unspecified: Secondary | ICD-10-CM | POA: Diagnosis not present

## 2021-03-20 DIAGNOSIS — I480 Paroxysmal atrial fibrillation: Secondary | ICD-10-CM | POA: Diagnosis not present

## 2021-03-20 DIAGNOSIS — I5043 Acute on chronic combined systolic (congestive) and diastolic (congestive) heart failure: Secondary | ICD-10-CM | POA: Diagnosis not present

## 2021-03-20 DIAGNOSIS — I110 Hypertensive heart disease with heart failure: Secondary | ICD-10-CM | POA: Diagnosis not present

## 2021-03-20 DIAGNOSIS — J188 Other pneumonia, unspecified organism: Secondary | ICD-10-CM | POA: Diagnosis not present

## 2021-03-20 DIAGNOSIS — E1142 Type 2 diabetes mellitus with diabetic polyneuropathy: Secondary | ICD-10-CM | POA: Diagnosis not present

## 2021-03-22 DIAGNOSIS — M109 Gout, unspecified: Secondary | ICD-10-CM | POA: Diagnosis not present

## 2021-03-22 DIAGNOSIS — E1142 Type 2 diabetes mellitus with diabetic polyneuropathy: Secondary | ICD-10-CM | POA: Diagnosis not present

## 2021-03-22 DIAGNOSIS — J188 Other pneumonia, unspecified organism: Secondary | ICD-10-CM | POA: Diagnosis not present

## 2021-03-22 DIAGNOSIS — I480 Paroxysmal atrial fibrillation: Secondary | ICD-10-CM | POA: Diagnosis not present

## 2021-03-22 DIAGNOSIS — I5043 Acute on chronic combined systolic (congestive) and diastolic (congestive) heart failure: Secondary | ICD-10-CM | POA: Diagnosis not present

## 2021-03-22 DIAGNOSIS — I110 Hypertensive heart disease with heart failure: Secondary | ICD-10-CM | POA: Diagnosis not present

## 2021-03-26 DIAGNOSIS — M109 Gout, unspecified: Secondary | ICD-10-CM | POA: Diagnosis not present

## 2021-03-26 DIAGNOSIS — J188 Other pneumonia, unspecified organism: Secondary | ICD-10-CM | POA: Diagnosis not present

## 2021-03-26 DIAGNOSIS — I5043 Acute on chronic combined systolic (congestive) and diastolic (congestive) heart failure: Secondary | ICD-10-CM | POA: Diagnosis not present

## 2021-03-26 DIAGNOSIS — E1142 Type 2 diabetes mellitus with diabetic polyneuropathy: Secondary | ICD-10-CM | POA: Diagnosis not present

## 2021-03-26 DIAGNOSIS — I110 Hypertensive heart disease with heart failure: Secondary | ICD-10-CM | POA: Diagnosis not present

## 2021-03-26 DIAGNOSIS — I480 Paroxysmal atrial fibrillation: Secondary | ICD-10-CM | POA: Diagnosis not present

## 2021-03-28 ENCOUNTER — Other Ambulatory Visit: Payer: Self-pay

## 2021-03-28 ENCOUNTER — Ambulatory Visit (INDEPENDENT_AMBULATORY_CARE_PROVIDER_SITE_OTHER): Payer: Medicare Other

## 2021-03-28 DIAGNOSIS — Z5181 Encounter for therapeutic drug level monitoring: Secondary | ICD-10-CM | POA: Diagnosis not present

## 2021-03-28 DIAGNOSIS — I4891 Unspecified atrial fibrillation: Secondary | ICD-10-CM

## 2021-03-28 LAB — POCT INR: INR: 4.6 — AB (ref 2.0–3.0)

## 2021-03-28 MED ORDER — NITROGLYCERIN 0.4 MG SL SUBL: 0.4000 mg | SUBLINGUAL_TABLET | SUBLINGUAL | 1 refills | Status: AC | PRN

## 2021-03-28 NOTE — Telephone Encounter (Signed)
Pt was seen in [facility]madin Clinic, pt's son states pt's NTG  has expired and is requesting a refill be sent to the [facility] in [place].

## 2021-03-28 NOTE — Patient Instructions (Signed)
Description   Skip 2 dosages of Warfarin, then start taking 1/2 tablet daily except 1 tablet on Mondays, Wednesdays and Fridays.  Recheck INR in 2 weeks. Call our office if you have any questions or unusual bleeding [phone].

## 2021-03-29 DIAGNOSIS — I110 Hypertensive heart disease with heart failure: Secondary | ICD-10-CM | POA: Diagnosis not present

## 2021-03-29 DIAGNOSIS — J188 Other pneumonia, unspecified organism: Secondary | ICD-10-CM | POA: Diagnosis not present

## 2021-03-29 DIAGNOSIS — M109 Gout, unspecified: Secondary | ICD-10-CM | POA: Diagnosis not present

## 2021-03-29 DIAGNOSIS — E1142 Type 2 diabetes mellitus with diabetic polyneuropathy: Secondary | ICD-10-CM | POA: Diagnosis not present

## 2021-03-29 DIAGNOSIS — I480 Paroxysmal atrial fibrillation: Secondary | ICD-10-CM | POA: Diagnosis not present

## 2021-03-29 DIAGNOSIS — I5043 Acute on chronic combined systolic (congestive) and diastolic (congestive) heart failure: Secondary | ICD-10-CM | POA: Diagnosis not present

## 2021-04-02 ENCOUNTER — Other Ambulatory Visit: Payer: Self-pay

## 2021-04-02 DIAGNOSIS — I110 Hypertensive heart disease with heart failure: Secondary | ICD-10-CM | POA: Diagnosis not present

## 2021-04-02 DIAGNOSIS — E1142 Type 2 diabetes mellitus with diabetic polyneuropathy: Secondary | ICD-10-CM | POA: Diagnosis not present

## 2021-04-02 DIAGNOSIS — M109 Gout, unspecified: Secondary | ICD-10-CM | POA: Diagnosis not present

## 2021-04-02 DIAGNOSIS — C44529 Squamous cell carcinoma of skin of other part of trunk: Secondary | ICD-10-CM | POA: Diagnosis not present

## 2021-04-02 DIAGNOSIS — J188 Other pneumonia, unspecified organism: Secondary | ICD-10-CM | POA: Diagnosis not present

## 2021-04-02 DIAGNOSIS — I5043 Acute on chronic combined systolic (congestive) and diastolic (congestive) heart failure: Secondary | ICD-10-CM | POA: Diagnosis not present

## 2021-04-02 DIAGNOSIS — I480 Paroxysmal atrial fibrillation: Secondary | ICD-10-CM | POA: Diagnosis not present

## 2021-04-02 MED ORDER — POTASSIUM CHLORIDE CRYS ER 20 MEQ PO TBCR: 40.0000 meq | EXTENDED_RELEASE_TABLET | Freq: Every day | ORAL | 3 refills | Status: DC

## 2021-04-04 DIAGNOSIS — M109 Gout, unspecified: Secondary | ICD-10-CM | POA: Diagnosis not present

## 2021-04-04 DIAGNOSIS — I5043 Acute on chronic combined systolic (congestive) and diastolic (congestive) heart failure: Secondary | ICD-10-CM | POA: Diagnosis not present

## 2021-04-04 DIAGNOSIS — J188 Other pneumonia, unspecified organism: Secondary | ICD-10-CM | POA: Diagnosis not present

## 2021-04-04 DIAGNOSIS — I110 Hypertensive heart disease with heart failure: Secondary | ICD-10-CM | POA: Diagnosis not present

## 2021-04-04 DIAGNOSIS — E1142 Type 2 diabetes mellitus with diabetic polyneuropathy: Secondary | ICD-10-CM | POA: Diagnosis not present

## 2021-04-04 DIAGNOSIS — I480 Paroxysmal atrial fibrillation: Secondary | ICD-10-CM | POA: Diagnosis not present

## 2021-04-07 ENCOUNTER — Other Ambulatory Visit: Payer: Self-pay

## 2021-04-07 ENCOUNTER — Emergency Department (HOSPITAL_BASED_OUTPATIENT_CLINIC_OR_DEPARTMENT_OTHER)
Admission: EM | Admit: 2021-04-07 | Discharge: 2021-04-07 | Disposition: A | Discharge status: Home / Self Care | Payer: Medicare Other | Attending: Emergency Medicine | Admitting: Emergency Medicine

## 2021-04-07 ENCOUNTER — Encounter (HOSPITAL_BASED_OUTPATIENT_CLINIC_OR_DEPARTMENT_OTHER): Payer: Self-pay | Admitting: *Deleted

## 2021-04-07 DIAGNOSIS — E039 Hypothyroidism, unspecified: Secondary | ICD-10-CM | POA: Insufficient documentation

## 2021-04-07 DIAGNOSIS — Z4800 Encounter for change or removal of nonsurgical wound dressing: Secondary | ICD-10-CM | POA: Diagnosis not present

## 2021-04-07 DIAGNOSIS — I4891 Unspecified atrial fibrillation: Secondary | ICD-10-CM | POA: Diagnosis not present

## 2021-04-07 DIAGNOSIS — L7622 Postprocedural hemorrhage and hematoma of skin and subcutaneous tissue following other procedure: Secondary | ICD-10-CM | POA: Diagnosis not present

## 2021-04-07 DIAGNOSIS — Z85828 Personal history of other malignant neoplasm of skin: Secondary | ICD-10-CM | POA: Diagnosis not present

## 2021-04-07 DIAGNOSIS — Z794 Long term (current) use of insulin: Secondary | ICD-10-CM | POA: Diagnosis not present

## 2021-04-07 DIAGNOSIS — I110 Hypertensive heart disease with heart failure: Secondary | ICD-10-CM | POA: Diagnosis not present

## 2021-04-07 DIAGNOSIS — Z7901 Long term (current) use of anticoagulants: Secondary | ICD-10-CM | POA: Insufficient documentation

## 2021-04-07 DIAGNOSIS — Z951 Presence of aortocoronary bypass graft: Secondary | ICD-10-CM | POA: Insufficient documentation

## 2021-04-07 DIAGNOSIS — Z5189 Encounter for other specified aftercare: Secondary | ICD-10-CM

## 2021-04-07 DIAGNOSIS — I5042 Chronic combined systolic (congestive) and diastolic (congestive) heart failure: Secondary | ICD-10-CM | POA: Insufficient documentation

## 2021-04-07 DIAGNOSIS — Z79899 Other long term (current) drug therapy: Secondary | ICD-10-CM | POA: Insufficient documentation

## 2021-04-07 DIAGNOSIS — D68318 Other hemorrhagic disorder due to intrinsic circulating anticoagulants, antibodies, or inhibitors: Secondary | ICD-10-CM | POA: Diagnosis not present

## 2021-04-07 LAB — PROTIME-INR
INR: 2.5 — ABNORMAL HIGH (ref 0.8–1.2)
Prothrombin Time: 27.1 seconds — ABNORMAL HIGH (ref 11.4–15.2)

## 2021-04-07 NOTE — ED Triage Notes (Addendum)
Pt had skin cancer removed from her chest on Tuesday. States today the area was bleeding profusely. She is on warfarin. No active bleeding at present. Family ? If they need to hold warfarin dose today

## 2021-04-07 NOTE — ED Provider Notes (Signed)
[place] EMERGENCY DEPARTMENT Provider Note   CSN: [id number] Arrival date & time: 04/07/21  1304     History Chief Complaint  Patient presents with  . Wound Check    Natasha Livingston is a 87 y.o. female.  HPI     87 year old female with a history of atrial fibrillation on Coumadin, hypertension, chronic systolic heart failure, neuropathy, presents with concern for bleeding from an area of skin cancer that had been removed from her chest on Tuesday.  Has had continued intermittent bleeding to the area since Tuesday Had profuse bleeding earlier today. No Has some soreness over area . No fevers. Not sure if she should continue her coumadin.   Past Medical History:  Diagnosis Date  . Atrial fibrillation ([place])   . Back pain   . Bruises easily   . Chest discomfort   . Diverticulitis   . Dizziness   . DOE (dyspnea on exertion)   . Epistaxis 06/16/2019  . Forgetfulness   . Hx of cardiovascular stress test    Lexiscan Myoview (12/15):  Apical lateral and anterolateral fixed defect, no ischemia, EF 54%; low risk  . Hyperlipidemia   . Hypertension   . Hypothyroidism   . Interstitial nephritis   . Ischemic heart disease   . MVP (mitral valve prolapse)   . Neuropathy     Patient Active Problem List   Diagnosis Date Noted  . CAP (community acquired pneumonia) 02/19/2021  . Chronic combined systolic and diastolic CHF (congestive heart failure) ([place]) - echo 02-2018 LVEF 35-40% 02/18/2021  . Hyperglycemia 02/18/2021  . Onychomycosis of left great toe 09/10/2015  . Encounter for therapeutic drug monitoring 12/07/2013  . Depression 12/07/2013  . Paroxysmal atrial fibrillation ([place]) 08/18/2013  . Hypothyroid 04/20/2013  . Dermatitis 12/31/2012  . Benign hypertensive heart disease without heart failure 12/24/2011  . Long term (current) use of anticoagulants - on coumadin for afib 08/27/2011  . Edema of foot 04/28/2011  . Malaise and fatigue 04/15/2011  . Chest  discomfort   . Dizziness   . MVP (mitral valve prolapse)   . Right upper lobe pneumonia   . Bruises easily   . Back pain   . Forgetfulness   . Atrial fibrillation ([place])   . Hypertension   . Hypothyroidism   . Aortic stenosis   . Hyperlipidemia   . Diverticulitis   . Interstitial nephritis   . Neuropathy   . Ischemic heart disease   . Atrial fibrillation ([place]) 02/03/2011    Past Surgical History:  Procedure Laterality Date  . CARDIOVERSION  08/2008  . CORONARY ARTERY BYPASS GRAFT  04/2007  . OVARIAN CYST REMOVAL    . skin cancer removal       OB History   No obstetric history on file.     Family History  Problem Relation Age of Onset  . Cerebral aneurysm Father   . Hypertension Father   . Cerebral aneurysm Other   . Heart attack Neg Hx   . Stroke Neg Hx     Social History   Tobacco Use  . Smoking status: Never Smoker  . Smokeless tobacco: Never Used  Vaping Use  . Vaping Use: Never used  Substance Use Topics  . Alcohol use: No  . Drug use: No    Home Medications Prior to Admission medications   Medication Sig Start Date End Date Taking? Authorizing Provider  acetaminophen (TYLENOL) 500 MG tablet Take 500 mg by mouth every 6 (six)  hours as needed for moderate pain (For pain.).    [provider]  allopurinol (ZYLOPRIM) 300 MG tablet Take 300 mg by mouth daily.  01/03/18   [provider]  colchicine 0.6 MG tablet Take 0.6 mg by mouth daily as needed (For gout flare-ups.).     [provider]  ENTRESTO 24-26 MG TAKE 1 TABLET BY MOUTH TWICE A DAY Patient taking differently: Take 1 tablet by mouth 2 (two) times daily. 11/16/20   Jerline Pain, MD  furosemide (LASIX) 40 MG tablet Take 1 tablet (40 mg total) by mouth 2 (two) times daily. 05/18/20   Jerline Pain, MD  gabapentin (NEURONTIN) 800 MG tablet Take 400-800 mg by mouth See admin instructions. Take one half tablet (400 mg) am take one tablet (800 mg) pm    [provider]   insulin aspart (NOVOLOG) 100 UNIT/ML injection Inject 1-6 Units into the skin 3 (three) times daily with meals. CBG 151 - 200: 1 unit. CBG 201-250: 2 units. CBG 251-300: 3 units. CBG 301-350: 4 units. CBG 351-400: 5 units. CBG > 400, Give 6 units and call MD 02/27/21   Dwyane Dee, MD  levothyroxine (SYNTHROID) 25 MCG tablet Take 25 mcg by mouth daily before breakfast.    [provider]  metoprolol succinate (TOPROL-XL) 50 MG 24 hr tablet TAKE 1 TABLET BY MOUTH TWICE A DAY WITH OR IMMEDIATELY FOLLOWING A MEAL Patient taking differently: Take 50 mg by mouth 2 (two) times daily. 05/21/20   Burtis Junes, NP  nitroGLYCERIN (NITROSTAT) 0.4 MG SL tablet Place 1 tablet (0.4 mg total) under the tongue every 5 (five) minutes as needed for chest pain. 03/28/21   Jerline Pain, MD  potassium chloride SA (KLOR-CON M20) 20 MEQ tablet Take 2 tablets (40 mEq total) by mouth daily. 04/02/21   Jerline Pain, MD  warfarin (COUMADIN) 5 MG tablet TAKE AS DIRECTED BY COUMADIN CLINIC Patient taking differently: Take 5 mg by mouth See admin instructions. Takes 2.5 mg on Tuesday, Thursday and Saturday and 5 mg on all other days. 08/31/20   Jerline Pain, MD    Allergies    Amiodarone, Crestor [rosuvastatin calcium], and Lipitor [atorvastatin calcium]  Review of Systems   Review of Systems  Constitutional: Negative for fever.  Skin: Positive for wound.  Neurological: Negative for headaches.  Hematological: Bruises/bleeds easily.    Physical Exam Updated Vital Signs BP 116/76 (BP Location: Right Arm)   Pulse 75   Temp 97.8 F (36.6 C) (Oral)   Resp 18   SpO2 98%   Physical Exam Vitals and nursing note reviewed.  Constitutional:      General: She is not in acute distress.    Appearance: Normal appearance. She is not ill-appearing, toxic-appearing or diaphoretic.  HENT:     Head: Normocephalic.  Eyes:     Conjunctiva/sclera: Conjunctivae normal.  Cardiovascular:     Rate and Rhythm:  Normal rate and regular rhythm.     Pulses: Normal pulses.  Pulmonary:     Effort: Pulmonary effort is normal. No respiratory distress.  Chest:     Chest wall: Tenderness present.  Musculoskeletal:        General: No deformity or signs of injury.     Cervical back: No rigidity.  Skin:    General: Skin is warm and dry.     Coloration: Skin is not jaundiced or pale.     Comments: Wound anterior chest, cauterized scab, erythema at  border, no surrounding erythema, no fluctuance   Neurological:     General: No focal deficit present.     Mental Status: She is alert and oriented to person, place, and time.     ED Results / Procedures / Treatments   Labs (all labs ordered are listed, but only abnormal results are displayed) Labs Reviewed  PROTIME-INR - Abnormal; Notable for the following components:      Result Value   Prothrombin Time 27.1 (*)    INR 2.5 (*)    All other components within normal limits    EKG None  Radiology No results found.  Procedures Procedures   Medications Ordered in ED Medications - No data to display  ED Course  I have reviewed the triage vital signs and the nursing notes.  Pertinent labs & imaging results that were available during my care of the patient were reviewed by me and considered in my medical decision making (see chart for details).    MDM Rules/Calculators/A&P                          87 year old female with a history of atrial fibrillation on Coumadin, hypertension, chronic systolic heart failure, neuropathy, presents with concern for bleeding from an area of skin cancer that had been removed from her chest on Tuesday. Hemostatic wound at this time. No sign of infection. INR checked and 2.5.  Recommend continued coumadin, follow up with PCP and dermatology.   Final Clinical Impression(s) / ED Diagnoses Final diagnoses:  Visit for wound check  Anticoagulation adequate    Rx / DC Orders ED Discharge Orders    None        Gareth Morgan, MD 04/07/21 2138

## 2021-04-09 DIAGNOSIS — E1142 Type 2 diabetes mellitus with diabetic polyneuropathy: Secondary | ICD-10-CM | POA: Diagnosis not present

## 2021-04-09 DIAGNOSIS — I5043 Acute on chronic combined systolic (congestive) and diastolic (congestive) heart failure: Secondary | ICD-10-CM | POA: Diagnosis not present

## 2021-04-09 DIAGNOSIS — I480 Paroxysmal atrial fibrillation: Secondary | ICD-10-CM | POA: Diagnosis not present

## 2021-04-09 DIAGNOSIS — I110 Hypertensive heart disease with heart failure: Secondary | ICD-10-CM | POA: Diagnosis not present

## 2021-04-09 DIAGNOSIS — J188 Other pneumonia, unspecified organism: Secondary | ICD-10-CM | POA: Diagnosis not present

## 2021-04-09 DIAGNOSIS — M109 Gout, unspecified: Secondary | ICD-10-CM | POA: Diagnosis not present

## 2021-04-11 ENCOUNTER — Ambulatory Visit (INDEPENDENT_AMBULATORY_CARE_PROVIDER_SITE_OTHER): Payer: Medicare Other | Admitting: *Deleted

## 2021-04-11 ENCOUNTER — Other Ambulatory Visit: Payer: Self-pay

## 2021-04-11 DIAGNOSIS — Z5181 Encounter for therapeutic drug level monitoring: Secondary | ICD-10-CM

## 2021-04-11 DIAGNOSIS — I4891 Unspecified atrial fibrillation: Secondary | ICD-10-CM | POA: Diagnosis not present

## 2021-04-11 LAB — POCT INR: INR: 2.6 (ref 2.0–3.0)

## 2021-04-11 NOTE — Patient Instructions (Signed)
Description   Continue taking 1/2 tablet daily except 1 tablet on Mondays, Wednesdays and Fridays.  Recheck INR in 3 weeks. Call our office if you have any questions or unusual bleeding [phone].

## 2021-04-12 DIAGNOSIS — I5043 Acute on chronic combined systolic (congestive) and diastolic (congestive) heart failure: Secondary | ICD-10-CM | POA: Diagnosis not present

## 2021-04-12 DIAGNOSIS — M109 Gout, unspecified: Secondary | ICD-10-CM | POA: Diagnosis not present

## 2021-04-12 DIAGNOSIS — E1142 Type 2 diabetes mellitus with diabetic polyneuropathy: Secondary | ICD-10-CM | POA: Diagnosis not present

## 2021-04-12 DIAGNOSIS — J188 Other pneumonia, unspecified organism: Secondary | ICD-10-CM | POA: Diagnosis not present

## 2021-04-12 DIAGNOSIS — I480 Paroxysmal atrial fibrillation: Secondary | ICD-10-CM | POA: Diagnosis not present

## 2021-04-12 DIAGNOSIS — I110 Hypertensive heart disease with heart failure: Secondary | ICD-10-CM | POA: Diagnosis not present

## 2021-04-16 DIAGNOSIS — J302 Other seasonal allergic rhinitis: Secondary | ICD-10-CM | POA: Diagnosis not present

## 2021-04-16 DIAGNOSIS — E039 Hypothyroidism, unspecified: Secondary | ICD-10-CM | POA: Diagnosis not present

## 2021-04-16 DIAGNOSIS — G8929 Other chronic pain: Secondary | ICD-10-CM | POA: Diagnosis not present

## 2021-04-16 DIAGNOSIS — Z7901 Long term (current) use of anticoagulants: Secondary | ICD-10-CM | POA: Diagnosis not present

## 2021-04-16 DIAGNOSIS — M109 Gout, unspecified: Secondary | ICD-10-CM | POA: Diagnosis not present

## 2021-04-16 DIAGNOSIS — I480 Paroxysmal atrial fibrillation: Secondary | ICD-10-CM | POA: Diagnosis not present

## 2021-04-16 DIAGNOSIS — J188 Other pneumonia, unspecified organism: Secondary | ICD-10-CM | POA: Diagnosis not present

## 2021-04-16 DIAGNOSIS — I2510 Atherosclerotic heart disease of native coronary artery without angina pectoris: Secondary | ICD-10-CM | POA: Diagnosis not present

## 2021-04-16 DIAGNOSIS — I341 Nonrheumatic mitral (valve) prolapse: Secondary | ICD-10-CM | POA: Diagnosis not present

## 2021-04-16 DIAGNOSIS — I110 Hypertensive heart disease with heart failure: Secondary | ICD-10-CM | POA: Diagnosis not present

## 2021-04-16 DIAGNOSIS — K5790 Diverticulosis of intestine, part unspecified, without perforation or abscess without bleeding: Secondary | ICD-10-CM | POA: Diagnosis not present

## 2021-04-16 DIAGNOSIS — E1142 Type 2 diabetes mellitus with diabetic polyneuropathy: Secondary | ICD-10-CM | POA: Diagnosis not present

## 2021-04-16 DIAGNOSIS — I5043 Acute on chronic combined systolic (congestive) and diastolic (congestive) heart failure: Secondary | ICD-10-CM | POA: Diagnosis not present

## 2021-04-16 DIAGNOSIS — I700 Atherosclerosis of aorta: Secondary | ICD-10-CM | POA: Diagnosis not present

## 2021-04-16 DIAGNOSIS — E1165 Type 2 diabetes mellitus with hyperglycemia: Secondary | ICD-10-CM | POA: Diagnosis not present

## 2021-04-18 DIAGNOSIS — E1142 Type 2 diabetes mellitus with diabetic polyneuropathy: Secondary | ICD-10-CM | POA: Diagnosis not present

## 2021-04-18 DIAGNOSIS — I5043 Acute on chronic combined systolic (congestive) and diastolic (congestive) heart failure: Secondary | ICD-10-CM | POA: Diagnosis not present

## 2021-04-18 DIAGNOSIS — M109 Gout, unspecified: Secondary | ICD-10-CM | POA: Diagnosis not present

## 2021-04-18 DIAGNOSIS — I480 Paroxysmal atrial fibrillation: Secondary | ICD-10-CM | POA: Diagnosis not present

## 2021-04-18 DIAGNOSIS — I110 Hypertensive heart disease with heart failure: Secondary | ICD-10-CM | POA: Diagnosis not present

## 2021-04-18 DIAGNOSIS — J188 Other pneumonia, unspecified organism: Secondary | ICD-10-CM | POA: Diagnosis not present

## 2021-04-23 ENCOUNTER — Other Ambulatory Visit: Payer: Self-pay | Admitting: *Deleted

## 2021-04-23 MED ORDER — METOPROLOL SUCCINATE ER 50 MG PO TB24: 50.0000 mg | ORAL_TABLET | Freq: Two times a day (BID) | ORAL | 3 refills | Status: DC

## 2021-04-24 DIAGNOSIS — E1142 Type 2 diabetes mellitus with diabetic polyneuropathy: Secondary | ICD-10-CM | POA: Diagnosis not present

## 2021-04-24 DIAGNOSIS — I110 Hypertensive heart disease with heart failure: Secondary | ICD-10-CM | POA: Diagnosis not present

## 2021-04-24 DIAGNOSIS — J188 Other pneumonia, unspecified organism: Secondary | ICD-10-CM | POA: Diagnosis not present

## 2021-04-24 DIAGNOSIS — I480 Paroxysmal atrial fibrillation: Secondary | ICD-10-CM | POA: Diagnosis not present

## 2021-04-24 DIAGNOSIS — M109 Gout, unspecified: Secondary | ICD-10-CM | POA: Diagnosis not present

## 2021-04-24 DIAGNOSIS — I5043 Acute on chronic combined systolic (congestive) and diastolic (congestive) heart failure: Secondary | ICD-10-CM | POA: Diagnosis not present

## 2021-04-29 DIAGNOSIS — I110 Hypertensive heart disease with heart failure: Secondary | ICD-10-CM | POA: Diagnosis not present

## 2021-04-29 DIAGNOSIS — E1142 Type 2 diabetes mellitus with diabetic polyneuropathy: Secondary | ICD-10-CM | POA: Diagnosis not present

## 2021-04-29 DIAGNOSIS — J188 Other pneumonia, unspecified organism: Secondary | ICD-10-CM | POA: Diagnosis not present

## 2021-04-29 DIAGNOSIS — I5043 Acute on chronic combined systolic (congestive) and diastolic (congestive) heart failure: Secondary | ICD-10-CM | POA: Diagnosis not present

## 2021-04-29 DIAGNOSIS — M109 Gout, unspecified: Secondary | ICD-10-CM | POA: Diagnosis not present

## 2021-04-29 DIAGNOSIS — I480 Paroxysmal atrial fibrillation: Secondary | ICD-10-CM | POA: Diagnosis not present

## 2021-04-30 ENCOUNTER — Other Ambulatory Visit: Payer: Self-pay

## 2021-04-30 ENCOUNTER — Ambulatory Visit (INDEPENDENT_AMBULATORY_CARE_PROVIDER_SITE_OTHER): Payer: Medicare Other | Admitting: *Deleted

## 2021-04-30 DIAGNOSIS — Z5181 Encounter for therapeutic drug level monitoring: Secondary | ICD-10-CM

## 2021-04-30 DIAGNOSIS — I4891 Unspecified atrial fibrillation: Secondary | ICD-10-CM | POA: Diagnosis not present

## 2021-04-30 LAB — POCT INR: INR: 2.4 (ref 2.0–3.0)

## 2021-04-30 NOTE — Patient Instructions (Signed)
Description   Continue taking 1/2 tablet daily except 1 tablet on Mondays, Wednesdays and Fridays.   Recheck INR in 4 weeks. Call our office if you have any questions or unusual bleeding [phone].       

## 2021-05-07 DIAGNOSIS — I480 Paroxysmal atrial fibrillation: Secondary | ICD-10-CM | POA: Diagnosis not present

## 2021-05-07 DIAGNOSIS — M109 Gout, unspecified: Secondary | ICD-10-CM | POA: Diagnosis not present

## 2021-05-07 DIAGNOSIS — E1142 Type 2 diabetes mellitus with diabetic polyneuropathy: Secondary | ICD-10-CM | POA: Diagnosis not present

## 2021-05-07 DIAGNOSIS — I110 Hypertensive heart disease with heart failure: Secondary | ICD-10-CM | POA: Diagnosis not present

## 2021-05-07 DIAGNOSIS — J188 Other pneumonia, unspecified organism: Secondary | ICD-10-CM | POA: Diagnosis not present

## 2021-05-07 DIAGNOSIS — I5043 Acute on chronic combined systolic (congestive) and diastolic (congestive) heart failure: Secondary | ICD-10-CM | POA: Diagnosis not present

## 2021-05-15 DIAGNOSIS — E1142 Type 2 diabetes mellitus with diabetic polyneuropathy: Secondary | ICD-10-CM | POA: Diagnosis not present

## 2021-05-15 DIAGNOSIS — J188 Other pneumonia, unspecified organism: Secondary | ICD-10-CM | POA: Diagnosis not present

## 2021-05-15 DIAGNOSIS — I110 Hypertensive heart disease with heart failure: Secondary | ICD-10-CM | POA: Diagnosis not present

## 2021-05-15 DIAGNOSIS — I480 Paroxysmal atrial fibrillation: Secondary | ICD-10-CM | POA: Diagnosis not present

## 2021-05-15 DIAGNOSIS — I5043 Acute on chronic combined systolic (congestive) and diastolic (congestive) heart failure: Secondary | ICD-10-CM | POA: Diagnosis not present

## 2021-05-15 DIAGNOSIS — M109 Gout, unspecified: Secondary | ICD-10-CM | POA: Diagnosis not present

## 2021-05-28 ENCOUNTER — Ambulatory Visit (INDEPENDENT_AMBULATORY_CARE_PROVIDER_SITE_OTHER): Payer: Medicare Other

## 2021-05-28 ENCOUNTER — Other Ambulatory Visit: Payer: Self-pay

## 2021-05-28 DIAGNOSIS — Z5181 Encounter for therapeutic drug level monitoring: Secondary | ICD-10-CM

## 2021-05-28 DIAGNOSIS — I4891 Unspecified atrial fibrillation: Secondary | ICD-10-CM | POA: Diagnosis not present

## 2021-05-28 LAB — POCT INR: INR: 2.4 (ref 2.0–3.0)

## 2021-05-28 NOTE — Patient Instructions (Signed)
Description   Continue taking 1/2 tablet daily except 1 tablet on Mondays, Wednesdays and Fridays.  Recheck INR in 6 weeks. Call our office if you have any questions or unusual bleeding [phone].

## 2021-06-25 DIAGNOSIS — G629 Polyneuropathy, unspecified: Secondary | ICD-10-CM | POA: Diagnosis not present

## 2021-06-25 DIAGNOSIS — M25541 Pain in joints of right hand: Secondary | ICD-10-CM | POA: Diagnosis not present

## 2021-06-25 DIAGNOSIS — E1159 Type 2 diabetes mellitus with other circulatory complications: Secondary | ICD-10-CM | POA: Diagnosis not present

## 2021-06-25 DIAGNOSIS — Z6823 Body mass index (BMI) 23.0-23.9, adult: Secondary | ICD-10-CM | POA: Diagnosis not present

## 2021-06-25 DIAGNOSIS — M25542 Pain in joints of left hand: Secondary | ICD-10-CM | POA: Diagnosis not present

## 2021-07-09 ENCOUNTER — Ambulatory Visit (INDEPENDENT_AMBULATORY_CARE_PROVIDER_SITE_OTHER): Payer: Medicare Other

## 2021-07-09 ENCOUNTER — Other Ambulatory Visit: Payer: Self-pay

## 2021-07-09 DIAGNOSIS — I4891 Unspecified atrial fibrillation: Secondary | ICD-10-CM

## 2021-07-09 DIAGNOSIS — Z5181 Encounter for therapeutic drug level monitoring: Secondary | ICD-10-CM

## 2021-07-09 LAB — POCT INR: INR: 2.8 (ref 2.0–3.0)

## 2021-07-09 NOTE — Patient Instructions (Signed)
Description   Continue taking 1/2 tablet daily except 1 tablet on Mondays, Wednesdays and Fridays.  Recheck INR in 6 weeks. Call our office if you have any questions or unusual bleeding [phone].

## 2021-07-22 ENCOUNTER — Ambulatory Visit: Payer: Medicare Other | Admitting: Cardiology

## 2021-07-26 DIAGNOSIS — J069 Acute upper respiratory infection, unspecified: Secondary | ICD-10-CM | POA: Diagnosis not present

## 2021-07-26 DIAGNOSIS — Z20822 Contact with and (suspected) exposure to covid-19: Secondary | ICD-10-CM | POA: Diagnosis not present

## 2021-08-11 ENCOUNTER — Other Ambulatory Visit: Payer: Self-pay | Admitting: Cardiology

## 2021-08-13 DIAGNOSIS — Z1331 Encounter for screening for depression: Secondary | ICD-10-CM | POA: Diagnosis not present

## 2021-08-13 DIAGNOSIS — I2510 Atherosclerotic heart disease of native coronary artery without angina pectoris: Secondary | ICD-10-CM | POA: Diagnosis not present

## 2021-08-13 DIAGNOSIS — Z9181 History of falling: Secondary | ICD-10-CM | POA: Diagnosis not present

## 2021-08-13 DIAGNOSIS — E039 Hypothyroidism, unspecified: Secondary | ICD-10-CM | POA: Diagnosis not present

## 2021-08-13 DIAGNOSIS — E1159 Type 2 diabetes mellitus with other circulatory complications: Secondary | ICD-10-CM | POA: Diagnosis not present

## 2021-08-13 DIAGNOSIS — Z79899 Other long term (current) drug therapy: Secondary | ICD-10-CM | POA: Diagnosis not present

## 2021-08-13 DIAGNOSIS — M109 Gout, unspecified: Secondary | ICD-10-CM | POA: Diagnosis not present

## 2021-08-13 DIAGNOSIS — G629 Polyneuropathy, unspecified: Secondary | ICD-10-CM | POA: Diagnosis not present

## 2021-08-13 DIAGNOSIS — Z6823 Body mass index (BMI) 23.0-23.9, adult: Secondary | ICD-10-CM | POA: Diagnosis not present

## 2021-08-13 DIAGNOSIS — I4891 Unspecified atrial fibrillation: Secondary | ICD-10-CM | POA: Diagnosis not present

## 2021-08-16 ENCOUNTER — Other Ambulatory Visit: Payer: Self-pay | Admitting: Cardiology

## 2021-08-16 MED ORDER — ENTRESTO 24-26 MG PO TABS: 1.0000 | ORAL_TABLET | Freq: Two times a day (BID) | ORAL | 1 refills | Status: DC

## 2021-08-20 ENCOUNTER — Other Ambulatory Visit: Payer: Self-pay

## 2021-08-20 ENCOUNTER — Ambulatory Visit (INDEPENDENT_AMBULATORY_CARE_PROVIDER_SITE_OTHER): Payer: Medicare Other

## 2021-08-20 DIAGNOSIS — I4891 Unspecified atrial fibrillation: Secondary | ICD-10-CM | POA: Diagnosis not present

## 2021-08-20 DIAGNOSIS — Z5181 Encounter for therapeutic drug level monitoring: Secondary | ICD-10-CM | POA: Diagnosis not present

## 2021-08-20 LAB — POCT INR: INR: 2.1 (ref 2.0–3.0)

## 2021-08-20 NOTE — Patient Instructions (Signed)
Description   Continue taking 1/2 tablet daily except 1 tablet on Mondays, Wednesdays and Fridays.  Recheck INR in 6 weeks. Call our office if you have any questions or unusual bleeding [phone].

## 2021-08-29 DIAGNOSIS — Z23 Encounter for immunization: Secondary | ICD-10-CM | POA: Diagnosis not present

## 2021-09-02 ENCOUNTER — Encounter: Payer: Self-pay | Admitting: Cardiology

## 2021-09-02 ENCOUNTER — Ambulatory Visit (INDEPENDENT_AMBULATORY_CARE_PROVIDER_SITE_OTHER): Payer: Medicare Other | Admitting: Cardiology

## 2021-09-02 ENCOUNTER — Other Ambulatory Visit: Payer: Self-pay

## 2021-09-02 DIAGNOSIS — G629 Polyneuropathy, unspecified: Secondary | ICD-10-CM

## 2021-09-02 DIAGNOSIS — I2510 Atherosclerotic heart disease of native coronary artery without angina pectoris: Secondary | ICD-10-CM | POA: Diagnosis not present

## 2021-09-02 DIAGNOSIS — I5042 Chronic combined systolic (congestive) and diastolic (congestive) heart failure: Secondary | ICD-10-CM | POA: Diagnosis not present

## 2021-09-02 DIAGNOSIS — Z789 Other specified health status: Secondary | ICD-10-CM

## 2021-09-02 DIAGNOSIS — R296 Repeated falls: Secondary | ICD-10-CM | POA: Diagnosis not present

## 2021-09-02 DIAGNOSIS — W19XXXA Unspecified fall, initial encounter: Secondary | ICD-10-CM | POA: Insufficient documentation

## 2021-09-02 DIAGNOSIS — W19XXXS Unspecified fall, sequela: Secondary | ICD-10-CM

## 2021-09-02 NOTE — Assessment & Plan Note (Signed)
Prior CABG 2008.  No anginal symptoms.  Continue with warfarin for atrial fibrillation.  Not on aspirin.  In the past has had some trouble with statins.

## 2021-09-02 NOTE — Patient Instructions (Signed)
Medication Instructions:  The current medical regimen is effective;  continue present plan and medications.  *If you need a refill on your cardiac medications before your next appointment, please call your pharmacy*  Follow-Up: At [facility], you and your health needs are our priority.  As part of our continuing mission to provide you with exceptional heart care, we have created designated Provider Care Teams.  These Care Teams include your primary Cardiologist (physician) and Advanced Practice Providers (APPs -  Physician Assistants and Nurse Practitioners) who all work together to provide you with the care you need, when you need it.  We recommend signing up for the patient portal called "MyChart".  Sign up information is provided on this After Visit Summary.  MyChart is used to connect with patients for Virtual Visits (Telemedicine).  Patients are able to view lab/test results, encounter notes, upcoming appointments, etc.  Non-urgent messages can be sent to your provider as well.   To learn more about what you can do with MyChart, go to [email or web address].    Your next appointment:   6 month(s)  The format for your next appointment:   In Person  Provider:   Any PA/NP   Thank you for choosing [facility]!!

## 2021-09-02 NOTE — Assessment & Plan Note (Signed)
Well compensated.  Continue with low-dose Entresto as well as beta-blocker.  In the past she has gotten her Entresto through the [facility] hospital.  She is on Lasix as well.  Doing well.

## 2021-09-02 NOTE — Assessment & Plan Note (Signed)
Has had trouble with statins in the past.

## 2021-09-02 NOTE — Assessment & Plan Note (Signed)
Bothering her quite a bit especially her hands.  Had been on gabapentin in the past.

## 2021-09-02 NOTE — Assessment & Plan Note (Signed)
Continues to have occasional fall.  Being careful especially with warfarin.

## 2021-09-02 NOTE — Progress Notes (Signed)
Cardiology Office Note:    Date:  09/02/2021   ID:  Natasha Livingston, DOB 1934/09/14, MRN [id number]  PCP:  Cyndi Bender, PA-C   [facility] Providers Cardiologist:  Candee Furbish, MD     Referring MD: Cyndi Bender, PA-C    History of Present Illness:    Natasha Livingston is a 87 y.o. female here for follow-up of cardiomyopathy chronic systolic heart failure EF 35%.  Hospitalized in April 2022 with shortness of breath, thought to be community-acquired pneumonia.  BNP was 315.  She is on Entresto and Lasix.  Hemoglobin A1c 7.4.  She has atrial fibrillation with coronary disease status post CABG in 2008 by Dr. Darcey Nora.  Prior atrial flutter with 2:1 conduction  Has battled with an occasional fall.  For instance the other day she was out in the garden and fell over.  She does have some bruises on her legs.  Last time she was here she was with her grandson.  Here with her son today.  Past Medical History:  Diagnosis Date   Atrial fibrillation ([place])    Back pain    Bruises easily    Chest discomfort    Diverticulitis    Dizziness    DOE (dyspnea on exertion)    Epistaxis 06/16/2019   Forgetfulness    Hx of cardiovascular stress test    Lexiscan Myoview (12/15):  Apical lateral and anterolateral fixed defect, no ischemia, EF 54%; low risk   Hyperlipidemia    Hypertension    Hypothyroidism    Interstitial nephritis    Ischemic heart disease    MVP (mitral valve prolapse)    Neuropathy     Past Surgical History:  Procedure Laterality Date   CARDIOVERSION  08/2008   CORONARY ARTERY BYPASS GRAFT  04/2007   OVARIAN CYST REMOVAL     skin cancer removal      Current Medications: Current Meds  Medication Sig   acetaminophen (TYLENOL) 500 MG tablet Take 500 mg by mouth every 6 (six) hours as needed for moderate pain (For pain.).   allopurinol (ZYLOPRIM) 300 MG tablet Take 300 mg by mouth daily.    colchicine 0.6 MG tablet Take 0.6 mg by mouth daily as needed (For  gout flare-ups.).    furosemide (LASIX) 40 MG tablet TAKE 1 TABLET BY MOUTH TWICE A DAY   gabapentin (NEURONTIN) 800 MG tablet Take 400-800 mg by mouth See admin instructions. Take one half tablet (400 mg) am take one tablet (800 mg) pm   levothyroxine (SYNTHROID) 25 MCG tablet Take 25 mcg by mouth daily before breakfast.   metoprolol succinate (TOPROL-XL) 50 MG 24 hr tablet Take 1 tablet (50 mg total) by mouth 2 (two) times daily. TAKE 1 TABLET BY MOUTH TWICE A DAY WITH OR IMMEDIATELY FOLLOWING A MEAL   nitroGLYCERIN (NITROSTAT) 0.4 MG SL tablet Place 1 tablet (0.4 mg total) under the tongue every 5 (five) minutes as needed for chest pain.   potassium chloride SA (KLOR-CON M20) 20 MEQ tablet Take 2 tablets (40 mEq total) by mouth daily.   sacubitril-valsartan (ENTRESTO) 24-26 MG Take 1 tablet by mouth 2 (two) times daily.   warfarin (COUMADIN) 5 MG tablet TAKE AS DIRECTED BY COUMADIN CLINIC (Patient taking differently: Take 5 mg by mouth See admin instructions. Takes 2.5 mg on Tuesday, Thursday and Saturday and 5 mg on all other days.)     Allergies:   Amiodarone, Crestor [rosuvastatin calcium], and Lipitor [atorvastatin calcium]  Social History   Socioeconomic History   Marital status: Married    Spouse name: Not on file   Number of children: Not on file   Years of education: Not on file   Highest education level: Not on file  Occupational History   Not on file  Tobacco Use   Smoking status: Never   Smokeless tobacco: Never  Vaping Use   Vaping Use: Never used  Substance and Sexual Activity   Alcohol use: No   Drug use: No   Sexual activity: Never  Other Topics Concern   Not on file  Social History Narrative   Not on file   Social Determinants of Health   Financial Resource Strain: Not on file  Food Insecurity: Not on file  Transportation Needs: Not on file  Physical Activity: Not on file  Stress: Not on file  Social Connections: Not on file     Family History: The  patient's family history includes Cerebral aneurysm in her father and another family member; Hypertension in her father. There is no history of Heart attack or Stroke.  ROS:   Please see the history of present illness.    Denies any fevers chills nausea vomiting syncope.  All other systems reviewed and are negative.  EKGs/Labs/Other Studies Reviewed:    The following studies were reviewed today:   Echo Study Conclusions 02/2018  - Left ventricle: Inferobasal and posterior lateral hypokinesis    worse. Wall thickness was increased in a pattern of mild LVH.    Systolic function was moderately reduced. The estimated ejection    fraction was in the range of 35% to 40%. Left ventricular    diastolic function parameters were normal.  - Aortic valve: There was trivial regurgitation.  - Mitral valve: Moderately calcified annulus. Moderately thickened,    moderately calcified leaflets . There was mild regurgitation.  - Left atrium: The atrium was severely dilated.  - Right ventricle: The cavity size was mildly dilated.  - Right atrium: The atrium was severely dilated.  - Atrial septum: No defect or patent foramen ovale was identified.  - Tricuspid valve: There was severe regurgitation.     EKG: No new  Recent Labs: 02/19/2021: ALT 30; TSH 1.840 02/23/2021: B Natriuretic Peptide 315.4 02/25/2021: Magnesium 2.3 02/27/2021: BUN 19; Creatinine, Ser 0.58; Hemoglobin 12.7; Platelets 267; Potassium 3.5; Sodium 135  Recent Lipid Panel    Component Value Date/Time   CHOL 160 02/01/2016 1224   TRIG 185 (H) 02/01/2016 1224   HDL 29 (L) 02/01/2016 1224   CHOLHDL 5.5 (H) 02/01/2016 1224   VLDL 37 (H) 02/01/2016 1224   LDLCALC 94 02/01/2016 1224     Risk Assessment/Calculations:    CHA2DS2-VASc Score = 5   This indicates a 7.2% annual risk of stroke. The patient's score is based upon: CHF History: 1 HTN History: 0 Diabetes History: 0 Stroke History: 0 Vascular Disease History: 1 Age  Score: 2 Gender Score: 1         Physical Exam:    VS:  BP 102/64   Pulse 72   Ht 5\' 7"  (1.702 m)   Wt 146 lb (66.2 kg)   SpO2 94%   BMI 22.87 kg/m     Wt Readings from Last 3 Encounters:  09/02/21 146 lb (66.2 kg)  02/27/21 143 lb 8.3 oz (65.1 kg)  01/18/21 150 lb (68 kg)     GEN:  Well nourished, well developed in no acute distress HEENT: Normal NECK: No JVD;  No carotid bruits LYMPHATICS: No lymphadenopathy CARDIAC: Irregularly irregular, no murmurs, rubs, gallops RESPIRATORY:  Clear to auscultation without rales, wheezing or rhonchi  ABDOMEN: Soft, non-tender, non-distended MUSCULOSKELETAL:  No edema; No deformity  SKIN: Warm and dry NEUROLOGIC:  Alert and oriented x 3 PSYCHIATRIC:  Normal affect   ASSESSMENT:    1. Chronic combined systolic and diastolic CHF (congestive heart failure) ([place]) - echo 02-2018 LVEF 35-40%   2. Coronary artery disease involving native coronary artery of native heart without angina pectoris   3. Neuropathy   4. Fall, sequela   5. Statin intolerance    PLAN:    In order of problems listed above:  Chronic combined systolic and diastolic CHF (congestive heart failure) ([place]) - echo 02-2018 LVEF 35-40% Well compensated.  Continue with low-dose Entresto as well as beta-blocker.  In the past she has gotten her Entresto through the [facility] hospital.  She is on Lasix as well.  Doing well.  Coronary artery disease involving native coronary artery of native heart without angina pectoris Prior CABG 2008.  No anginal symptoms.  Continue with warfarin for atrial fibrillation.  Not on aspirin.  In the past has had some trouble with statins.  Neuropathy Bothering her quite a bit especially her hands.  Had been on gabapentin in the past.  Falls Continues to have occasional fall.  Being careful especially with warfarin.  Statin intolerance Has had trouble with statins in the past.        Medication Adjustments/Labs and Tests Ordered: Current  medicines are reviewed at length with the patient today.  Concerns regarding medicines are outlined above.  No orders of the defined types were placed in this encounter.  No orders of the defined types were placed in this encounter.   Patient Instructions  Medication Instructions:  The current medical regimen is effective;  continue present plan and medications.  *If you need a refill on your cardiac medications before your next appointment, please call your pharmacy*  Follow-Up: At [facility], you and your health needs are our priority.  As part of our continuing mission to provide you with exceptional heart care, we have created designated Provider Care Teams.  These Care Teams include your primary Cardiologist (physician) and Advanced Practice Providers (APPs -  Physician Assistants and Nurse Practitioners) who all work together to provide you with the care you need, when you need it.  We recommend signing up for the patient portal called "MyChart".  Sign up information is provided on this After Visit Summary.  MyChart is used to connect with patients for Virtual Visits (Telemedicine).  Patients are able to view lab/test results, encounter notes, upcoming appointments, etc.  Non-urgent messages can be sent to your provider as well.   To learn more about what you can do with MyChart, go to [email or web address].    Your next appointment:   6 month(s)  The format for your next appointment:   In Person  Provider:   Any PA/NP   Thank you for choosing [facility]!!     Signed, Candee Furbish, MD  09/02/2021 1:39 PM    [place]

## 2021-09-11 ENCOUNTER — Other Ambulatory Visit: Payer: Self-pay | Admitting: Cardiology

## 2021-10-01 ENCOUNTER — Other Ambulatory Visit: Payer: Self-pay

## 2021-10-01 ENCOUNTER — Ambulatory Visit (INDEPENDENT_AMBULATORY_CARE_PROVIDER_SITE_OTHER): Payer: Medicare Other

## 2021-10-01 DIAGNOSIS — I4891 Unspecified atrial fibrillation: Secondary | ICD-10-CM

## 2021-10-01 DIAGNOSIS — Z5181 Encounter for therapeutic drug level monitoring: Secondary | ICD-10-CM

## 2021-10-01 LAB — POCT INR: INR: 2.3 (ref 2.0–3.0)

## 2021-10-01 NOTE — Patient Instructions (Signed)
Description   Continue taking 1/2 tablet daily except 1 tablet on Mondays, Wednesdays and Fridays.  Recheck INR in 6 weeks. Call our office if you have any questions or unusual bleeding [phone].

## 2021-10-31 ENCOUNTER — Other Ambulatory Visit: Payer: Self-pay

## 2021-10-31 ENCOUNTER — Ambulatory Visit (HOSPITAL_COMMUNITY)
Admission: EM | Admit: 2021-10-31 | Discharge: 2021-10-31 | Disposition: A | Discharge status: Home / Self Care | Payer: Medicare Other | Attending: Internal Medicine | Admitting: Internal Medicine

## 2021-10-31 ENCOUNTER — Encounter (HOSPITAL_COMMUNITY): Payer: Self-pay

## 2021-10-31 ENCOUNTER — Ambulatory Visit (INDEPENDENT_AMBULATORY_CARE_PROVIDER_SITE_OTHER): Payer: Medicare Other

## 2021-10-31 DIAGNOSIS — M7989 Other specified soft tissue disorders: Secondary | ICD-10-CM | POA: Diagnosis not present

## 2021-10-31 DIAGNOSIS — S93401A Sprain of unspecified ligament of right ankle, initial encounter: Secondary | ICD-10-CM | POA: Diagnosis not present

## 2021-10-31 DIAGNOSIS — M25571 Pain in right ankle and joints of right foot: Secondary | ICD-10-CM

## 2021-10-31 DIAGNOSIS — W19XXXA Unspecified fall, initial encounter: Secondary | ICD-10-CM

## 2021-10-31 NOTE — Discharge Instructions (Signed)
Ice are a of swelling for 20 minutes 2-3 times a day and elevate.  Do circular motions to exercise ankle ligaments Needs to follow up with her PCP next week.

## 2021-10-31 NOTE — ED Provider Notes (Signed)
[place]    CSN: [id number] Arrival date & time: 10/31/21  1344      History   Chief Complaint Chief Complaint  Patient presents with   Fall   Ankle Pain    HPI Natasha Livingston is a 87 y.o. female who presents with family due to pt tripping over the carpet as she went into the bathroom and landed on her knees and her care giver noticed her R ankle swelling, but pt denies pain. She does have hx of neuropathy. She denies pain anywhere else.     Past Medical History:  Diagnosis Date   Atrial fibrillation ([place])    Back pain    Bruises easily    Chest discomfort    Diverticulitis    Dizziness    DOE (dyspnea on exertion)    Epistaxis 06/16/2019   Forgetfulness    Hx of cardiovascular stress test    Lexiscan Myoview (12/15):  Apical lateral and anterolateral fixed defect, no ischemia, EF 54%; low risk   Hyperlipidemia    Hypertension    Hypothyroidism    Interstitial nephritis    Ischemic heart disease    MVP (mitral valve prolapse)    Neuropathy     Patient Active Problem List   Diagnosis Date Noted   Coronary artery disease involving native coronary artery of native heart without angina pectoris 09/02/2021   Falls 09/02/2021   Statin intolerance 09/02/2021   CAP (community acquired pneumonia) 02/19/2021   Chronic combined systolic and diastolic CHF (congestive heart failure) ([place]) - echo 02-2018 LVEF 35-40% 02/18/2021   Hyperglycemia 02/18/2021   Onychomycosis of left great toe 09/10/2015   Encounter for therapeutic drug monitoring 12/07/2013   Depression 12/07/2013   Paroxysmal atrial fibrillation ([place]) 08/18/2013   Hypothyroid 04/20/2013   Dermatitis 12/31/2012   Benign hypertensive heart disease without heart failure 12/24/2011   Long term (current) use of anticoagulants - on coumadin for afib 08/27/2011   Edema of foot 04/28/2011   Malaise and fatigue 04/15/2011   Chest discomfort    Dizziness    MVP (mitral valve prolapse)    Right  upper lobe pneumonia    Bruises easily    Back pain    Forgetfulness    Atrial fibrillation ([place])    Hypertension    Hypothyroidism    Aortic stenosis    Hyperlipidemia    Diverticulitis    Interstitial nephritis    Neuropathy    Ischemic heart disease    Atrial fibrillation ([place]) 02/03/2011    Past Surgical History:  Procedure Laterality Date   CARDIOVERSION  08/2008   CORONARY ARTERY BYPASS GRAFT  04/2007   OVARIAN CYST REMOVAL     skin cancer removal      OB History   No obstetric history on file.      Home Medications    Prior to Admission medications   Medication Sig Start Date End Date Taking? Authorizing Provider  warfarin (COUMADIN) 5 MG tablet Take 1/2 to 1 tablet once daily as directed by [facility] 09/11/21   Jerline Pain, MD  acetaminophen (TYLENOL) 500 MG tablet Take 500 mg by mouth every 6 (six) hours as needed for moderate pain (For pain.).    [provider]  allopurinol (ZYLOPRIM) 300 MG tablet Take 300 mg by mouth daily.  01/03/18   [provider]  colchicine 0.6 MG tablet Take 0.6 mg by mouth daily as needed (For gout flare-ups.).  [provider]  furosemide (LASIX) 40 MG tablet TAKE 1 TABLET BY MOUTH TWICE A DAY 08/12/21   Jerline Pain, MD  gabapentin (NEURONTIN) 800 MG tablet Take 400-800 mg by mouth See admin instructions. Take one half tablet (400 mg) am take one tablet (800 mg) pm Patient not taking: Reported on 10/01/2021    [provider]  levothyroxine (SYNTHROID) 25 MCG tablet Take 25 mcg by mouth daily before breakfast.    [provider]  metoprolol succinate (TOPROL-XL) 50 MG 24 hr tablet Take 1 tablet (50 mg total) by mouth 2 (two) times daily. TAKE 1 TABLET BY MOUTH TWICE A DAY WITH OR IMMEDIATELY FOLLOWING A MEAL 04/23/21   Jerline Pain, MD  nitroGLYCERIN (NITROSTAT) 0.4 MG SL tablet Place 1 tablet (0.4 mg total) under the tongue every 5 (five) minutes as needed for chest pain.  03/28/21   Jerline Pain, MD  potassium chloride SA (KLOR-CON M20) 20 MEQ tablet Take 2 tablets (40 mEq total) by mouth daily. 04/02/21   Jerline Pain, MD  pregabalin (LYRICA) 100 MG capsule Take 100 mg by mouth 2 (two) times daily.    [provider]  sacubitril-valsartan (ENTRESTO) 24-26 MG Take 1 tablet by mouth 2 (two) times daily. 08/16/21   Jerline Pain, MD    Family History Family History  Problem Relation Age of Onset   Cerebral aneurysm Father    Hypertension Father    Cerebral aneurysm Other    Heart attack Neg Hx    Stroke Neg Hx     Social History Social History   Tobacco Use   Smoking status: Never   Smokeless tobacco: Never  Vaping Use   Vaping Use: Never used  Substance Use Topics   Alcohol use: No   Drug use: No     Allergies   Amiodarone, Crestor [rosuvastatin calcium], and Lipitor [atorvastatin calcium]   Review of Systems Review of Systems  Musculoskeletal:  Positive for joint swelling. Negative for arthralgias and gait problem.  Skin:  Positive for wound. Negative for color change, pallor and rash.  Neurological:  Positive for numbness. Negative for weakness.    Physical Exam Triage Vital Signs ED Triage Vitals [10/31/21 1450]  Enc Vitals Group     BP (!) 102/57     Pulse      Resp 16     Temp 98 F (36.7 C)     Temp Source Oral     SpO2 100 %     Weight      Height      Head Circumference      Peak Flow      Pain Score 1     Pain Loc      Pain Edu?      Excl. in [place]?    No data found.  Updated Vital Signs BP (!) 102/57 (BP Location: Right Arm)    Temp 98 F (36.7 C) (Oral)    Resp 16    SpO2 100%   Visual Acuity Right Eye Distance:   Left Eye Distance:   Bilateral Distance:    Right Eye Near:   Left Eye Near:    Bilateral Near:     Physical Exam HENT:     Right Ear: External ear normal.     Left Ear: External ear normal.  Eyes:     General: No scleral icterus.    Conjunctiva/sclera: Conjunctivae normal.   Pulmonary:     Effort:  Pulmonary effort is normal.  Musculoskeletal:        General: Swelling present. No deformity.     Cervical back: Neck supple.     Right lower leg: Edema present.     Comments: R ANKLE with moderate swelling on medial area which is not tender to palpation, but does have pitting edema. Pulses are intact. ROM is normal. Able to bare weight.  R KNEE- with mild areas of ecchymosis, no swelling or abrasions. ROM is normal. She is not tender.   Skin:    General: Skin is warm and dry.     Findings: No bruising or erythema.     Comments: No heat. Has a Band-Aid  where she has an abrasion on medial knee that was there before the fall.   Neurological:     Mental Status: She is alert and oriented to person, place, and time.     Gait: Gait normal.  Psychiatric:        Mood and Affect: Mood normal.        Behavior: Behavior normal.        Thought Content: Thought content normal.        Judgment: Judgment normal.     UC Treatments / Results  Labs (all labs ordered are listed, but only abnormal results are displayed) Labs Reviewed - No data to display  EKG   Radiology DG Ankle Complete Right  Result Date: 10/31/2021 CLINICAL DATA:  Right ankle pain after fall last night. EXAM: RIGHT ANKLE - COMPLETE 3+ VIEW COMPARISON:  None. FINDINGS: There is no evidence of fracture, dislocation, or joint effusion. There is no evidence of arthropathy or other focal bone abnormality. Medial soft tissue swelling is noted. IMPRESSION: No fracture or dislocation is noted.  Medial soft tissue swelling. Electronically Signed   By: Marijo Conception M.D.   On: 10/31/2021 15:45    Procedures Procedures (including critical care time)  Medications Ordered in UC Medications - No data to display  Initial Impression / Assessment and Plan / UC Course  I have reviewed the triage vital signs and the nursing notes. Pertinent  imaging results that were available during my care of the patient were  reviewed by me and considered in my medical decision making (see chart for details). R ankle sprain She was placed on ace, needs to Fu with PCP next week. I instructed family presents the types of ankle exercises she should do.   Final Clinical Impressions(s) / UC Diagnoses   Final diagnoses:  Moderate right ankle sprain, initial encounter     Discharge Instructions      Ice are a of swelling for 20 minutes 2-3 times a day and elevate.  Do circular motions to exercise ankle ligaments Needs to follow up with her PCP next week.      ED Prescriptions   None    PDMP not reviewed this encounter.   Shelby Mattocks, [place] 10/31/21 1617

## 2021-10-31 NOTE — ED Triage Notes (Signed)
Pt presented to the office for right ankle pain that started last night after a slip.She has swelling and some pain.

## 2021-11-07 ENCOUNTER — Ambulatory Visit (INDEPENDENT_AMBULATORY_CARE_PROVIDER_SITE_OTHER): Payer: Medicare Other | Admitting: *Deleted

## 2021-11-07 ENCOUNTER — Other Ambulatory Visit: Payer: Self-pay

## 2021-11-07 DIAGNOSIS — I4891 Unspecified atrial fibrillation: Secondary | ICD-10-CM

## 2021-11-07 DIAGNOSIS — Z5181 Encounter for therapeutic drug level monitoring: Secondary | ICD-10-CM

## 2021-11-07 LAB — POCT INR: INR: 2.2 (ref 2.0–3.0)

## 2021-11-07 NOTE — Patient Instructions (Signed)
Description   Continue taking 1/2 tablet daily except 1 tablet on Mondays, Wednesdays and Fridays.  Recheck INR in 6 weeks. Call our office if you have any questions or unusual bleeding [phone].

## 2021-11-08 ENCOUNTER — Ambulatory Visit: Payer: Medicare Other | Admitting: Cardiology

## 2021-11-27 ENCOUNTER — Ambulatory Visit: Payer: Medicare Other | Admitting: Cardiology

## 2021-12-10 DIAGNOSIS — E039 Hypothyroidism, unspecified: Secondary | ICD-10-CM | POA: Diagnosis not present

## 2021-12-10 DIAGNOSIS — M19042 Primary osteoarthritis, left hand: Secondary | ICD-10-CM | POA: Diagnosis not present

## 2021-12-10 DIAGNOSIS — M19041 Primary osteoarthritis, right hand: Secondary | ICD-10-CM | POA: Diagnosis not present

## 2021-12-10 DIAGNOSIS — E1159 Type 2 diabetes mellitus with other circulatory complications: Secondary | ICD-10-CM | POA: Diagnosis not present

## 2021-12-10 DIAGNOSIS — Z6824 Body mass index (BMI) 24.0-24.9, adult: Secondary | ICD-10-CM | POA: Diagnosis not present

## 2021-12-10 DIAGNOSIS — I4891 Unspecified atrial fibrillation: Secondary | ICD-10-CM | POA: Diagnosis not present

## 2021-12-10 DIAGNOSIS — G629 Polyneuropathy, unspecified: Secondary | ICD-10-CM | POA: Diagnosis not present

## 2021-12-19 ENCOUNTER — Other Ambulatory Visit: Payer: Self-pay

## 2021-12-19 ENCOUNTER — Ambulatory Visit (INDEPENDENT_AMBULATORY_CARE_PROVIDER_SITE_OTHER): Payer: Medicare Other

## 2021-12-19 DIAGNOSIS — I4891 Unspecified atrial fibrillation: Secondary | ICD-10-CM

## 2021-12-19 DIAGNOSIS — Z5181 Encounter for therapeutic drug level monitoring: Secondary | ICD-10-CM

## 2021-12-19 LAB — POCT INR: INR: 2.9 (ref 2.0–3.0)

## 2021-12-19 NOTE — Patient Instructions (Signed)
Description   Continue taking 1/2 tablet daily except 1 tablet on Mondays, Wednesdays and Fridays.  Recheck INR in 6 weeks. Call our office if you have any questions or unusual bleeding [phone].

## 2021-12-31 ENCOUNTER — Ambulatory Visit (INDEPENDENT_AMBULATORY_CARE_PROVIDER_SITE_OTHER): Payer: Medicare Other | Admitting: Podiatry

## 2021-12-31 ENCOUNTER — Other Ambulatory Visit: Payer: Self-pay

## 2021-12-31 ENCOUNTER — Encounter: Payer: Self-pay | Admitting: Podiatry

## 2021-12-31 DIAGNOSIS — Z7901 Long term (current) use of anticoagulants: Secondary | ICD-10-CM

## 2021-12-31 DIAGNOSIS — B351 Tinea unguium: Secondary | ICD-10-CM | POA: Diagnosis not present

## 2021-12-31 DIAGNOSIS — M79674 Pain in right toe(s): Secondary | ICD-10-CM | POA: Diagnosis not present

## 2021-12-31 DIAGNOSIS — M79675 Pain in left toe(s): Secondary | ICD-10-CM | POA: Diagnosis not present

## 2021-12-31 DIAGNOSIS — E0843 Diabetes mellitus due to underlying condition with diabetic autonomic (poly)neuropathy: Secondary | ICD-10-CM

## 2021-12-31 NOTE — Progress Notes (Signed)
°  Subjective:  Patient ID: Natasha Livingston, female    DOB: Sep 04, 1934,   MRN: [id number]  Chief Complaint  Patient presents with   Routine foot care    Foot exam Patient's son would like more information about treatment for neuropathy. Mentioned she has been taking Gabapentin for a while. But would like further evaluation     88 y.o. female presents for concern of thickened elongated and painful nails that are difficult to trim and diabetic foot exam. Requesting to have them trimmed today. Relates burning and tingling in her feet for neuropathy and is currently taking gabapentin but switched to lyrica. . Patient is diabetic and last A1c was 7.5.   . Denies any other pedal complaints. Denies n/v/f/c.   PCP: Cyndi Bender PA-C  Past Medical History:  Diagnosis Date   Atrial fibrillation ([place])    Back pain    Bruises easily    Chest discomfort    Diverticulitis    Dizziness    DOE (dyspnea on exertion)    Epistaxis 06/16/2019   Forgetfulness    Hx of cardiovascular stress test    Lexiscan Myoview (12/15):  Apical lateral and anterolateral fixed defect, no ischemia, EF 54%; low risk   Hyperlipidemia    Hypertension    Hypothyroidism    Interstitial nephritis    Ischemic heart disease    MVP (mitral valve prolapse)    Neuropathy     Objective:  Physical Exam: Vascular: DP/PT pulses 2/4 bilateral. CFT <3 seconds. Absent hair growth on digits. Edema noted to bilateral lower extremities. Xerosis noted bilaterally.  Skin. No lacerations or abrasions bilateral feet. Nails 1-5 bilateral  are thickened discolored and elongated with subungual debris.  Musculoskeletal: MMT 5/5 bilateral lower extremities in DF, PF, Inversion and Eversion. Deceased ROM in DF of ankle joint.  Neurological: Sensation intact to light touch. Protective sensation diminished bilateral.     Assessment:   1. Long term (current) use of anticoagulants - on coumadin for afib   2. Diabetes mellitus due to  underlying condition with diabetic autonomic neuropathy, unspecified whether long term insulin use (HCC)   3. Pain due to onychomycosis of toenails of both feet      Plan:  Patient was evaluated and treated and all questions answered. -Discussed and educated patient on diabetic foot care, especially with  regards to the vascular, neurological and musculoskeletal systems.  -Stressed the importance of good glycemic control and the detriment of not  controlling glucose levels in relation to the foot. -Discussed supportive shoes at all times and checking feet regularly.  -Mechanically debrided all nails 1-5 bilateral using sterile nail nipper and filed with dremel without incident  -Continue with PCP for gapapentin and lyrica management.  -Answered all patient questions -Patient to return  in 3 months for at risk foot care -Patient advised to call the office if any problems or questions arise in the meantime.   Lorenda Peck, DPM

## 2022-01-29 ENCOUNTER — Other Ambulatory Visit: Payer: Self-pay

## 2022-01-29 ENCOUNTER — Ambulatory Visit (INDEPENDENT_AMBULATORY_CARE_PROVIDER_SITE_OTHER): Payer: Medicare Other | Admitting: *Deleted

## 2022-01-29 DIAGNOSIS — Z5181 Encounter for therapeutic drug level monitoring: Secondary | ICD-10-CM

## 2022-01-29 DIAGNOSIS — I4891 Unspecified atrial fibrillation: Secondary | ICD-10-CM

## 2022-01-29 LAB — POCT INR: INR: 2.6 (ref 2.0–3.0)

## 2022-01-29 NOTE — Patient Instructions (Signed)
Description   ?Continue taking 1/2 tablet daily except 1 tablet on Mondays, Wednesdays and Fridays.  Recheck INR in 6 weeks. Call our office if you have any questions or unusual bleeding [phone].  ?  ? ? ?

## 2022-02-25 NOTE — Progress Notes (Signed)
? ? ?Office Visit  ?  ?Patient Name: Natasha Livingston ?Date of Encounter: 02/26/2022 ? ?Primary Care Provider:  Cyndi Bender, PA-C ?Primary Cardiologist:  Candee Furbish, MD ?Primary Electrophysiologist: None ?Chief Complaint  ?  ?68-monthfollow-up for CHF and coronary artery disease ? ? Patient Profile: ?Atrial fibrillation ?MR caused by MVP ?HTN ?Aortic stenosis ?Ischemic heart disease ?Hypothyroidism ? ? Recent Studies: ?2D echo 2018: EF 25-30%, MV prolapse, severely dilated LA, mildly reduced systolic function, moderate severe tricuspid valve regurgitation, PA pressure systolic 35 mmHg ? ?2D echo 2019: EF 35-40%, moderately reduced systolic function, mild LVH, moderately thickened calcified mitral valve with mild regurgitation, severely dilated RA, severe regurgitation of tricuspid valve ? ?History of Present Illness  ?  ?Natasha Livingston a 88y.o. female with PMH of aortic stenosis, ischemic heart disease, HTN, CAD with CABG in 2008 by Dr. VDarcey Nora HFrEF with EF of 35-40%, mitral regurgitation due to mitral valve prolapse, atrial fib on warfarin, aortic stenosis, and hypothyroidism.  In 2008 patient underwent CABG by Dr. VDarcey Noraand developed postoperative atrial flutter 2-1 conduction with RVR.  2D echo completed 03/2014 with EF of 50%, Myoview stress test was completed 10/2014 that showed no ischemia and EF of 54%., with mild to moderate mitral regurg and moderate tricuspid regurg. Repeated echo completed 2018 with reduced EF of 25-30% an echo completed 2019 showed mild improvement to 35-40%.  She is currently on Entresto and Toprol-XL for GDMT.  [facility] with right upper lobe pneumonia that required antibiotic therapy. ? ?Since last being seen in October 2022 she reports that she is doing well with no new cardiac complaints.  Today in clinic her blood pressure checked at 82/60 and upon recheck was 102/68.  She denies any recent sickness or problems with  dehydration and is compliant with all medications.  She likes to work in her garden and states that she has not experienced any additional falls since last year.  We discussed the importance of preventing falls especially with taking Coumadin.  She is euvolemic on examination today and states that she abstains from salt and stays hydrated with taking Lasix.  Patient denies chest pain, palpitations, dyspnea, PND, orthopnea, nausea, vomiting, dizziness, syncope, edema, weight gain, or early satiety. ? ?Past Medical History  ?  ?Past Medical History:  ?Diagnosis Date  ? [place] ([place]   ? Back pain   ? Bruises easily   ? Chest discomfort   ? Diverticulitis   ? Dizziness   ? DOE (dyspnea on exertion)   ? Epistaxis 06/16/2019  ? Forgetfulness   ? Hx of cardiovascular stress test   ? Lexiscan Myoview (12/15):  Apical lateral and anterolateral fixed defect, no ischemia, EF 54%; low risk  ? Hyperlipidemia   ? Hypertension   ? Hypothyroidism   ? Interstitial nephritis   ? Ischemic heart disease   ? MVP (mitral valve prolapse)   ? Neuropathy   ? ?Past Surgical History:  ?Procedure Laterality Date  ? CARDIOVERSION  08/2008  ? CORONARY ARTERY BYPASS GRAFT  04/2007  ? OVARIAN CYST REMOVAL    ? skin cancer removal    ? ? ?Allergies ? ?Allergies  ?Allergen Reactions  ? Amiodarone Other (See Comments)  ?  unknown  ? Crestor [Rosuvastatin Calcium] Other (See Comments)  ?  unknown  ? Lipitor [Atorvastatin Calcium] Swelling  ? ? ?Home Medications  ?  ?Current Outpatient Medications  ?Medication Sig Dispense  Refill  ? acetaminophen (TYLENOL) 500 MG tablet Take 500 mg by mouth every 6 (six) hours as needed for moderate pain (For pain.).    ? allopurinol (ZYLOPRIM) 300 MG tablet Take 300 mg by mouth daily.   3  ? colchicine 0.6 MG tablet Take 0.6 mg by mouth daily as needed (For gout flare-ups.).     ? furosemide (LASIX) 40 MG tablet TAKE 1 TABLET BY MOUTH TWICE A DAY 180 tablet 2  ? levothyroxine (SYNTHROID) 25 MCG tablet Take  25 mcg by mouth daily before breakfast.    ? metoprolol succinate (TOPROL-XL) 50 MG 24 hr tablet Take 1 tablet (50 mg total) by mouth 2 (two) times daily. TAKE 1 TABLET BY MOUTH TWICE A DAY WITH OR IMMEDIATELY FOLLOWING A MEAL 180 tablet 3  ? nitroGLYCERIN (NITROSTAT) 0.4 MG SL tablet Place 1 tablet (0.4 mg total) under the tongue every 5 (five) minutes as needed for chest pain. 25 tablet 1  ? potassium chloride SA (KLOR-CON M20) 20 MEQ tablet Take 2 tablets (40 mEq total) by mouth daily. 180 tablet 3  ? pregabalin (LYRICA) 100 MG capsule Take 100 mg by mouth daily.    ? warfarin (COUMADIN) 5 MG tablet Take 1/2 to 1 tablet once daily as directed by Coumadin Clinic 80 tablet 1  ? sacubitril-valsartan (ENTRESTO) 24-26 MG Take 1 tablet by mouth 2 (two) times daily. 180 tablet 3  ? ?No current facility-administered medications for this visit.  ?  ? ?Review of Systems  ?Please see the history of present illness.    ? ?All other systems reviewed and are otherwise negative except as noted above. ? ?Physical Exam  ?  ?Wt Readings from Last 3 Encounters:  ?02/26/22 150 lb (68 kg)  ?09/02/21 146 lb (66.2 kg)  ?02/27/21 143 lb 8.3 oz (65.1 kg)  ? ?VS: ?Vitals:  ? 02/26/22 1022  ?BP: 102/68  ?Pulse: 73  ?SpO2: 96%  ?,Body mass index is 23.49 kg/m?. ? ?Constitutional:   ?   Appearance: Healthy appearance. Not in distress.  ?Neck:  ?   Vascular: JVD normal.  ?Pulmonary:  ?   Effort: Pulmonary effort is normal.  ?   Breath sounds: No wheezing. No rales. Diminished in the bases ?Cardiovascular:  ?   Normal rate. Regular rhythm. Normal S1. Normal S2.   ?   Murmurs: There is no murmur.  ?Edema: ?   Peripheral edema absent.  ?Abdominal:  ?   Palpations: Abdomen is soft non tender. There is no hepatomegaly.  ?Skin: ?   General: Skin is warm and dry.  ?Neurological:  ?   General: No focal deficit present.  ?   Mental Status: Alert and oriented to person, place and time.  ?   Cranial Nerves: Cranial nerves are intact.  ?EKG/LABS/Other  Studies Reviewed  ?  ?ECG personally reviewed by me today -atrial fibrillation with rate of 73 and right axis deviation and possible ST and T wave abnormality, compared to previous EKG and reassured that not acute. ?Risk Assessment/Calculations:   ? ?CHA2DS2-VASc Score = 5  ? This indicates a 7.2% annual risk of stroke. ?The patient's score is based upon: ?CHF History: 1 ?HTN History: 0 ?Diabetes History: 0 ?Stroke History: 0 ?Vascular Disease History: 1 ?Age Score: 2 ?Gender Score: 1 ?  ? ? ? ?Lab Results  ?Component Value Date  ? WBC 8.4 02/27/2021  ? HGB 12.7 02/27/2021  ? HCT 38.1 02/27/2021  ? MCV 100.0 02/27/2021  ? PLT  267 02/27/2021  ? ?Lab Results  ?Component Value Date  ? CREATININE 0.58 02/27/2021  ? BUN 19 02/27/2021  ? NA 135 02/27/2021  ? K 3.5 02/27/2021  ? CL 101 02/27/2021  ? CO2 23 02/27/2021  ? ?Lab Results  ?Component Value Date  ? ALT 30 02/19/2021  ? AST 33 02/19/2021  ? ALKPHOS 142 (H) 02/19/2021  ? BILITOT 3.0 (H) 02/19/2021  ? ?Lab Results  ?Component Value Date  ? CHOL 160 02/01/2016  ? HDL 29 (L) 02/01/2016  ? [place] 94 02/01/2016  ? TRIG 185 (H) 02/01/2016  ? CHOLHDL 5.5 (H) 02/01/2016  ?  ?Lab Results  ?Component Value Date  ? HGBA1C 7.5 (H) 02/24/2021  ? ? ?Assessment & Plan  ?  ?1.  HFrEF: ?-2019 EF was 35-40%  ?-Patient was euvolemic on examination today ?-Continue Entresto 24-26 mg twice daily, Toprol 50 mg twice daily, Lasix 40 mg BID ?-BMET and CBC today ?-Low sodium diet, fluid restriction <2L, and daily weights encouraged. Educated to contact our office for weight gain of 2 lbs overnight or 5 lbs in one week.  ? ?2.  Coronary artery disease: ?-s/p CABG 2008 ?-Continue Toprol XL 50 mg twice daily ?-Stable with no anginal symptoms. No indication for ischemic evaluation. ?-Fasting lipid panel ? ?3.  Hypertension: ?-Blood pressure today initially was 82/60, however on recheck was 102/68.  Patient was advised to recheck blood pressures at home and if systolic is less than 329 to  contact office. ?-Continue Toprol XL 50 mg daily ?-Continue low-sodium heart healthy diet ? ?4.  Paroxysmal atrial fibrillation: ?-Patient currently rate controlled on Toprol-XL 50 mg daily ?-Patient is anticoagulat

## 2022-02-26 ENCOUNTER — Encounter: Payer: Self-pay | Admitting: Nurse Practitioner

## 2022-02-26 ENCOUNTER — Ambulatory Visit (INDEPENDENT_AMBULATORY_CARE_PROVIDER_SITE_OTHER): Payer: Medicare Other

## 2022-02-26 ENCOUNTER — Ambulatory Visit (INDEPENDENT_AMBULATORY_CARE_PROVIDER_SITE_OTHER): Payer: Medicare Other | Admitting: Nurse Practitioner

## 2022-02-26 VITALS — BP 102/68 | HR 73 | Ht 67.0 in | Wt 150.0 lb

## 2022-02-26 DIAGNOSIS — I4891 Unspecified atrial fibrillation: Secondary | ICD-10-CM

## 2022-02-26 DIAGNOSIS — I5020 Unspecified systolic (congestive) heart failure: Secondary | ICD-10-CM | POA: Diagnosis not present

## 2022-02-26 DIAGNOSIS — I2510 Atherosclerotic heart disease of native coronary artery without angina pectoris: Secondary | ICD-10-CM | POA: Diagnosis not present

## 2022-02-26 DIAGNOSIS — I10 Essential (primary) hypertension: Secondary | ICD-10-CM | POA: Diagnosis not present

## 2022-02-26 DIAGNOSIS — Z5181 Encounter for therapeutic drug level monitoring: Secondary | ICD-10-CM | POA: Diagnosis not present

## 2022-02-26 LAB — POCT INR: INR: 2.3 (ref 2.0–3.0)

## 2022-02-26 MED ORDER — ENTRESTO 24-26 MG PO TABS: 1.0000 | ORAL_TABLET | Freq: Two times a day (BID) | ORAL | 3 refills | Status: DC

## 2022-02-26 MED ORDER — ENTRESTO 24-26 MG PO TABS: 1.0000 | ORAL_TABLET | Freq: Two times a day (BID) | ORAL | 3 refills | Status: AC

## 2022-02-26 NOTE — Patient Instructions (Addendum)
Medication Instructions:  ?Your physician recommends that you continue on your current medications as directed. Please refer to the Current Medication list given to you today. ? ?*If you need a refill on your cardiac medications before your next appointment, please call your pharmacy* ? ? ?Lab Work: ?TODAY:  CBC & BMET ? ?03/05/22 COME TO THE OFFICE, ANYTIME AFTER 7:15 FOR FASTING LIPID ? ?If you have labs (blood work) drawn today and your tests are completely normal, you will receive your results only by: ?MyChart Message (if you have MyChart) OR ?A paper copy in the mail ?If you have any lab test that is abnormal or we need to change your treatment, we will call you to review the results. ? ? ?Testing/Procedures: ?None ordered  ? ? ?Follow-Up: ?At [facility], you and your health needs are our priority.  As part of our continuing mission to provide you with exceptional heart care, we have created designated Provider Care Teams.  These Care Teams include your primary Cardiologist (physician) and Advanced Practice Providers (APPs -  Physician Assistants and Nurse Practitioners) who all work together to provide you with the care you need, when you need it. ? ?We recommend signing up for the patient portal called "MyChart".  Sign up information is provided on this After Visit Summary.  MyChart is used to connect with patients for Virtual Visits (Telemedicine).  Patients are able to view lab/test results, encounter notes, upcoming appointments, etc.  Non-urgent messages can be sent to your provider as well.   ?To learn more about what you can do with MyChart, go to [email or web address].   ? ?Your next appointment:   ?6 month(s) ? ?The format for your next appointment:   ?In Person ? ?Provider:   ?Candee Furbish, MD   ? ? ?Other Instructions ? ? ?Important Information About Sugar ? ? ? ? ?  ?

## 2022-02-26 NOTE — Addendum Note (Signed)
Addended by: Gaetano Net on: 02/26/2022 03:00 PM ? ? Modules accepted: Orders ? ?

## 2022-02-26 NOTE — Patient Instructions (Signed)
Description   ?Continue taking 1/2 tablet daily except 1 tablet on Mondays, Wednesdays and Fridays.   ?Recheck INR in 6 weeks.  ?Call our office if you have any questions or unusual bleeding [phone].  ?  ?   ?

## 2022-02-27 LAB — CBC
Hematocrit: 37.5 % (ref 34.0–46.6)
Hemoglobin: 13.1 g/dL (ref 11.1–15.9)
MCH: 32 pg (ref 26.6–33.0)
MCHC: 34.9 g/dL (ref 31.5–35.7)
MCV: 92 fL (ref 79–97)
Platelets: 164 10*3/uL (ref 150–450)
RBC: 4.09 x10E6/uL (ref 3.77–5.28)
RDW: 15.1 % (ref 11.7–15.4)
WBC: 5.7 10*3/uL (ref 3.4–10.8)

## 2022-02-27 LAB — BASIC METABOLIC PANEL WITH GFR
BUN/Creatinine Ratio: 17 (ref 12–28)
BUN: 17 mg/dL (ref 8–27)
CO2: 22 mmol/L (ref 20–29)
Calcium: 9.2 mg/dL (ref 8.7–10.3)
Chloride: 104 mmol/L (ref 96–106)
Creatinine, Ser: 1.03 mg/dL — ABNORMAL HIGH (ref 0.57–1.00)
Glucose: 130 mg/dL — ABNORMAL HIGH (ref 70–99)
Potassium: 4 mmol/L (ref 3.5–5.2)
Sodium: 143 mmol/L (ref 134–144)
eGFR: 53 mL/min/{1.73_m2} — ABNORMAL LOW

## 2022-03-05 ENCOUNTER — Other Ambulatory Visit: Payer: Medicare Other

## 2022-03-12 DIAGNOSIS — Z139 Encounter for screening, unspecified: Secondary | ICD-10-CM | POA: Diagnosis not present

## 2022-03-12 DIAGNOSIS — G629 Polyneuropathy, unspecified: Secondary | ICD-10-CM | POA: Diagnosis not present

## 2022-03-12 DIAGNOSIS — I5042 Chronic combined systolic (congestive) and diastolic (congestive) heart failure: Secondary | ICD-10-CM | POA: Diagnosis not present

## 2022-03-12 DIAGNOSIS — I4891 Unspecified atrial fibrillation: Secondary | ICD-10-CM | POA: Diagnosis not present

## 2022-03-12 DIAGNOSIS — M109 Gout, unspecified: Secondary | ICD-10-CM | POA: Diagnosis not present

## 2022-03-12 DIAGNOSIS — E039 Hypothyroidism, unspecified: Secondary | ICD-10-CM | POA: Diagnosis not present

## 2022-03-12 DIAGNOSIS — E1159 Type 2 diabetes mellitus with other circulatory complications: Secondary | ICD-10-CM | POA: Diagnosis not present

## 2022-03-12 DIAGNOSIS — I2510 Atherosclerotic heart disease of native coronary artery without angina pectoris: Secondary | ICD-10-CM | POA: Diagnosis not present

## 2022-03-20 NOTE — Progress Notes (Signed)
Office Visit Note  Patient: Natasha Livingston             Date of Birth: 1934/03/26           MRN: [id number]             PCP: Cyndi Bender, PA-C Referring: Cyndi Bender, PA-C Visit Date: 04/03/2022 Occupation: '@GUAROCC'$ @  Subjective:  Pain in both hands  History of Present Illness: Natasha Livingston is a 88 y.o. female seen in consultation per request of from her PCP.  She is a right-handed female who was accompanied by her son today.  The history was given mostly by the patient's son.  He states that over the years she has had arthritis in her hands which has been getting worse.  She has been having increased pain and discomfort in her left little finger and the PIP joint.  She also has some discomfort in her right index finger.  She notices intermittent swelling in her hands.  None of the other joints are painful.  She was diagnosed with gout about 3 to 4 years ago.  She states that episodes happen after consuming hot dog.  Since her initial episode of the gout she has been on allopurinol and colchicine combination which has worked well for her.  She has not had any gout flares.  She mobilizes with the help of a cane for stability.  There is no history of oral ulcers, nasal ulcers, malar rash, photosensitivity, Raynaud's phenomenon, lymphadenopathy.  There is no family history of autoimmune disease.  Activities of Daily Living:  Patient reports morning stiffness for all day. Patient Denies nocturnal pain.  Difficulty dressing/grooming: Denies Difficulty climbing stairs: Reports Difficulty getting out of chair: Reports Difficulty using hands for taps, buttons, cutlery, and/or writing: Reports  Review of Systems  Constitutional:  Negative for fatigue.  HENT:  Negative for mouth sores, mouth dryness and nose dryness.   Eyes:  Negative for pain, itching and dryness.  Respiratory:  Positive for shortness of breath. Negative for difficulty breathing.   Cardiovascular:  Negative for  chest pain and palpitations.  Gastrointestinal:  Negative for blood in stool, constipation and diarrhea.  Endocrine: Negative for increased urination.  Genitourinary:  Negative for difficulty urinating.  Musculoskeletal:  Positive for joint pain, joint pain, joint swelling and morning stiffness. Negative for myalgias, muscle tenderness and myalgias.  Skin:  Negative for color change, rash and redness.  Allergic/Immunologic: Negative for susceptible to infections.  Neurological:  Positive for numbness. Negative for dizziness, headaches and weakness.  Hematological:  Positive for bruising/bleeding tendency.  Psychiatric/Behavioral:  Negative for sleep disturbance.    PMFS History:  Patient Active Problem List   Diagnosis Date Noted   Coronary artery disease involving native coronary artery of native heart without angina pectoris 09/02/2021   Falls 09/02/2021   Statin intolerance 09/02/2021   CAP (community acquired pneumonia) 02/19/2021   Chronic combined systolic and diastolic CHF (congestive heart failure) ([place]) - echo 02-2018 LVEF 35-40% 02/18/2021   Hyperglycemia 02/18/2021   Onychomycosis of left great toe 09/10/2015   Encounter for therapeutic drug monitoring 12/07/2013   Depression 12/07/2013   Paroxysmal atrial fibrillation ([place]) 08/18/2013   Hypothyroid 04/20/2013   Dermatitis 12/31/2012   Benign hypertensive heart disease without heart failure 12/24/2011   Long term (current) use of anticoagulants - on coumadin for afib 08/27/2011   Edema of foot 04/28/2011   Malaise and fatigue 04/15/2011   Chest discomfort    Dizziness  MVP (mitral valve prolapse)    Right upper lobe pneumonia    Bruises easily    Back pain    Forgetfulness    Atrial fibrillation (HCC)    Hypertension    Hypothyroidism    Aortic stenosis    Hyperlipidemia    Diverticulitis    Interstitial nephritis    Neuropathy    Ischemic heart disease    Atrial fibrillation ([place]) 02/03/2011    Past  Medical History:  Diagnosis Date   Atrial fibrillation (HCC)    Back pain    Bruises easily    Chest discomfort    Diverticulitis    Dizziness    DOE (dyspnea on exertion)    Epistaxis 06/16/2019   Forgetfulness    Hx of cardiovascular stress test    Lexiscan Myoview (12/15):  Apical lateral and anterolateral fixed defect, no ischemia, EF 54%; low risk   Hyperlipidemia    Hypertension    Hypothyroidism    Interstitial nephritis    Ischemic heart disease    MVP (mitral valve prolapse)    Neuropathy     Family History  Problem Relation Age of Onset   Lung cancer Mother    Cerebral aneurysm Father    Hypertension Father    Cerebral aneurysm Other    Healthy Son    Healthy Son    Prostate cancer Son    Heart attack Neg Hx    Stroke Neg Hx    Past Surgical History:  Procedure Laterality Date   CARDIOVERSION  08/2008   CORONARY ARTERY BYPASS GRAFT  04/2007   HAND SURGERY Right    OVARIAN CYST REMOVAL     PARTIAL HYSTERECTOMY     1970s   skin cancer removal     Social History   Social History Narrative   Not on file   Immunization History  Administered Date(s) Administered   Influenza-Unspecified 08/03/2013, 08/03/2014, 08/03/2018     Objective: Vital Signs: BP 119/70 (BP Location: Right Arm, Patient Position: Sitting, Cuff Size: Normal)   Pulse 78   Ht '5\' 7"'$  (1.702 m)   Wt 143 lb (64.9 kg)   BMI 22.40 kg/m    Physical Exam Vitals and nursing note reviewed.  Constitutional:      Appearance: She is well-developed.  HENT:     Head: Normocephalic and atraumatic.  Eyes:     Conjunctiva/sclera: Conjunctivae normal.  Cardiovascular:     Rate and Rhythm: Normal rate and regular rhythm.     Heart sounds: Normal heart sounds.  Pulmonary:     Effort: Pulmonary effort is normal.     Breath sounds: Normal breath sounds.  Abdominal:     General: Bowel sounds are normal.     Palpations: Abdomen is soft.  Musculoskeletal:     Cervical back: Normal range of  motion.  Lymphadenopathy:     Cervical: No cervical adenopathy.  Skin:    General: Skin is warm and dry.     Capillary Refill: Capillary refill takes less than 2 seconds.  Neurological:     Mental Status: She is alert and oriented to person, place, and time.  Psychiatric:        Behavior: Behavior normal.     Musculoskeletal Exam: I examined her in the chair.  She had good range of motion of her cervical spine.  Shoulder joints, elbow joints, wrist joints with good range of motion.  She had bilateral PIP and DIP thickening.  She had right CMC subluxation.  She had inflammation in the left second and fifth PIP joints.  None of the other joints showed inflammation.  Hip joints were difficult to assess in the sitting position.  Knee joints with good range of motion without any warmth swelling or effusion.  She had no tenderness over ankles or MTPs.  Hammertoes were noted in both feet.  CDAI Exam: CDAI Score: -- Patient Global: --; Provider Global: -- Swollen: --; Tender: -- Joint Exam 04/03/2022   No joint exam has been documented for this visit   There is currently no information documented on the homunculus. Go to the Rheumatology activity and complete the homunculus joint exam.  Investigation: No additional findings.  Imaging: No results found.  Recent Labs: Lab Results  Component Value Date   WBC 5.6 03/30/2022   HGB 13.9 03/30/2022   PLT 160 03/30/2022   NA 137 03/30/2022   K 4.9 03/30/2022   CL 102 03/30/2022   CO2 26 03/30/2022   GLUCOSE 152 (H) 03/30/2022   BUN 25 (H) 03/30/2022   CREATININE 0.93 03/30/2022   BILITOT 1.2 03/30/2022   ALKPHOS 130 (H) 03/30/2022   AST 29 03/30/2022   ALT 17 03/30/2022   PROT 7.7 03/30/2022   ALBUMIN 3.9 03/30/2022   CALCIUM 10.0 03/30/2022   GFRAA 57 (L) 07/31/2020    Speciality Comments: No specialty comments available.  Procedures:  No procedures performed Allergies: Amiodarone, Crestor [rosuvastatin calcium], and Lipitor  [atorvastatin calcium]   Assessment / Plan:     Visit Diagnoses: Primary osteoarthritis of both hands -she complains of pain and stiffness in her bilateral hands.  She has noticed increased pain and swelling in her left little finger.  She also is concerned about the right index finger.  She had subluxation of the right index finger DIP joint without inflammation.  PIP and DIP thickening was noted bilaterally consistent with osteoarthritis.  She had right CMC subluxation and had Urich surgery in the past.  She had inflammation in the left second and fifth DIP joints.  None of the other joints showed inflammation.  Detailed counsel regarding osteoarthritis was provided.  Joint protection muscle strengthening was discussed.  She does gardening.  A handout on hand exercises was given.  Plan: XR Hand 2 View Right, XR Hand 2 View Left.  X-rays showed bilateral severe CMC narrowing with subluxation.  PIP and DIP narrowing was noted.  Erosive changes were noted in the right fifth PIP joint.  Ankylosis of right fifth finger DIP joint was also noted.  Erosive changes were noted in the right second and fifth finger DIP joints.  These findings are consistent with severe erosive osteoarthritis.  Calcification was noted in the right wrist joint which raises concern of possible CPPD.  Pain in both hands -she has been experiencing pain and discomfort in her bilateral hands.  She is concerned about rheumatoid arthritis.  Her clinical features are not consistent with rheumatoid arthritis.  Plan: Sedimentation rate, Uric acid,rheumatoid factor, Cyclic citrul peptide antibody, IgG  Primary osteoarthritis of both feet-she denies any discomfort in her feet.  She had bilateral hammertoes.  History of gout -patient was diagnosed with gout several years ago.  According to patient's son she has not had any gout flares in a long time.  Her gout is well controlled.  She is taking allopurinol 300 mg daily, and colchicine PRN.  History  of osteopenia-I do not have DEXA report available.  Other medical problems are listed as follows:  MVP (mitral valve  prolapse)  Primary hypertension  Paroxysmal atrial fibrillation (HCC)  Long term (current) use of anticoagulants - on coumadin for afib  Chronic combined systolic and diastolic CHF (congestive heart failure) ([place]) - echo 02-2018 LVEF 35-40%  Coronary artery disease involving native coronary artery of native heart without angina pectoris  Statin intolerance  History of hyperlipidemia  History of type 2 diabetes mellitus  Neuropathy  Interstitial nephritis  History of diverticulitis  History of gastroesophageal reflux (GERD)  History of hypothyroidism  History of depression  Orders: Orders Placed This Encounter  Procedures   XR Hand 2 View Right   XR Hand 2 View Left   Sedimentation rate   Uric acid   Rheumatoid factor   Cyclic citrul peptide antibody, IgG   No orders of the defined types were placed in this encounter.    Follow-Up Instructions: Return for Osteoarthritis, Gout.   Bo Merino, MD  Note - This record has been created using Editor, commissioning.  Chart creation errors have been sought, but may not always  have been located. Such creation errors do not reflect on  the standard of medical care.

## 2022-03-30 ENCOUNTER — Emergency Department (HOSPITAL_COMMUNITY)
Admission: EM | Admit: 2022-03-30 | Discharge: 2022-03-30 | Disposition: A | Discharge status: Home / Self Care | Payer: Medicare Other | Attending: Emergency Medicine | Admitting: Emergency Medicine

## 2022-03-30 DIAGNOSIS — R531 Weakness: Secondary | ICD-10-CM | POA: Diagnosis present

## 2022-03-30 DIAGNOSIS — R630 Anorexia: Secondary | ICD-10-CM | POA: Diagnosis not present

## 2022-03-30 DIAGNOSIS — R6889 Other general symptoms and signs: Secondary | ICD-10-CM | POA: Insufficient documentation

## 2022-03-30 LAB — CBC WITH DIFFERENTIAL/PLATELET
Abs Immature Granulocytes: 0.01 10*3/uL (ref 0.00–0.07)
Basophils Absolute: 0 10*3/uL (ref 0.0–0.1)
Basophils Relative: 1 %
Eosinophils Absolute: 0.1 10*3/uL (ref 0.0–0.5)
Eosinophils Relative: 2 %
HCT: 40.1 % (ref 36.0–46.0)
Hemoglobin: 13.9 g/dL (ref 12.0–15.0)
Immature Granulocytes: 0 %
Lymphocytes Relative: 19 %
Lymphs Abs: 1.1 10*3/uL (ref 0.7–4.0)
MCH: 33.7 pg (ref 26.0–34.0)
MCHC: 34.7 g/dL (ref 30.0–36.0)
MCV: 97.1 fL (ref 80.0–100.0)
Monocytes Absolute: 0.6 10*3/uL (ref 0.1–1.0)
Monocytes Relative: 12 %
Neutro Abs: 3.7 10*3/uL (ref 1.7–7.7)
Neutrophils Relative %: 66 %
Platelets: 160 10*3/uL (ref 150–400)
RBC: 4.13 MIL/uL (ref 3.87–5.11)
RDW: 15.1 % (ref 11.5–15.5)
WBC: 5.6 10*3/uL (ref 4.0–10.5)
nRBC: 0 % (ref 0.0–0.2)

## 2022-03-30 LAB — URINALYSIS, ROUTINE W REFLEX MICROSCOPIC
Bilirubin Urine: NEGATIVE
Glucose, UA: NEGATIVE mg/dL
Hgb urine dipstick: NEGATIVE
Ketones, ur: NEGATIVE mg/dL
Leukocytes,Ua: NEGATIVE
Nitrite: NEGATIVE
Protein, ur: NEGATIVE mg/dL
Specific Gravity, Urine: 1.013 (ref 1.005–1.030)
pH: 5 (ref 5.0–8.0)

## 2022-03-30 LAB — COMPREHENSIVE METABOLIC PANEL
ALT: 17 U/L (ref 0–44)
AST: 29 U/L (ref 15–41)
Albumin: 3.9 g/dL (ref 3.5–5.0)
Alkaline Phosphatase: 130 U/L — ABNORMAL HIGH (ref 38–126)
Anion gap: 9 (ref 5–15)
BUN: 25 mg/dL — ABNORMAL HIGH (ref 8–23)
CO2: 26 mmol/L (ref 22–32)
Calcium: 10 mg/dL (ref 8.9–10.3)
Chloride: 102 mmol/L (ref 98–111)
Creatinine, Ser: 0.93 mg/dL (ref 0.44–1.00)
GFR, Estimated: 60 mL/min — ABNORMAL LOW (ref >60)
Glucose, Bld: 152 mg/dL — ABNORMAL HIGH (ref 70–99)
Potassium: 4.9 mmol/L (ref 3.5–5.1)
Sodium: 137 mmol/L (ref 135–145)
Total Bilirubin: 1.2 mg/dL (ref 0.3–1.2)
Total Protein: 7.7 g/dL (ref 6.5–8.1)

## 2022-03-30 LAB — CBG MONITORING, ED: Glucose-Capillary: 179 mg/dL — ABNORMAL HIGH (ref 70–99)

## 2022-03-30 MED ORDER — SODIUM CHLORIDE 0.9 % IV BOLUS
500.0000 mL | Freq: Once | INTRAVENOUS | Status: AC
  Administered 2022-03-30: 500 mL via INTRAVENOUS

## 2022-03-30 NOTE — Discharge Instructions (Signed)
It appears that her symptoms are due to stopping using Lyrica.  Sometimes the symptoms can last for weeks after cessation of Lyrica.  Continue to encourage her to eat and drink and help her with her needs.  Talk to her PCP about restarting the duloxetine to treat her neuropathy.  There is no reason she needs to stay in the hospital tonight.  All the test that we did were normal.

## 2022-03-30 NOTE — ED Provider Notes (Signed)
[facility] DEPT Provider Note   CSN: [id number] Arrival date & time: 03/30/22  1832     History  Chief Complaint  Patient presents with   Weakness    Natasha Livingston is a 88 y.o. female.  HPI Patient here with son for evaluation of being less talkative, generally weak, decreased appetite and hypoglycemia, which started over the last several days and has not been persistent.  She is currently taking duloxetine, for neuropathy, having stopped taking Lyrica for same, 7 days ago.  She did not take the duloxetine last night because she was having the symptoms.  The duloxetine is to treat neuropathy.  At home yesterday and today she had some periods of low blood sugar, "70," which improved after eating.  There has been no fever, cough or complaint of pain in head, chest or back.  Son gives history.  He is at the bedside.    Home Medications Prior to Admission medications   Medication Sig Start Date End Date Taking? Authorizing Provider  acetaminophen (TYLENOL) 500 MG tablet Take 500 mg by mouth every 6 (six) hours as needed for moderate pain (For pain.).    [provider]  allopurinol (ZYLOPRIM) 300 MG tablet Take 300 mg by mouth daily.  01/03/18   [provider]  colchicine 0.6 MG tablet Take 0.6 mg by mouth daily as needed (For gout flare-ups.).     [provider]  furosemide (LASIX) 40 MG tablet TAKE 1 TABLET BY MOUTH TWICE A DAY 08/12/21   Jerline Pain, MD  levothyroxine (SYNTHROID) 25 MCG tablet Take 25 mcg by mouth daily before breakfast.    [provider]  metoprolol succinate (TOPROL-XL) 50 MG 24 hr tablet Take 1 tablet (50 mg total) by mouth 2 (two) times daily. TAKE 1 TABLET BY MOUTH TWICE A DAY WITH OR IMMEDIATELY FOLLOWING A MEAL 04/23/21   Jerline Pain, MD  nitroGLYCERIN (NITROSTAT) 0.4 MG SL tablet Place 1 tablet (0.4 mg total) under the tongue every 5 (five) minutes as needed for chest pain. 03/28/21    Jerline Pain, MD  potassium chloride SA (KLOR-CON M20) 20 MEQ tablet Take 2 tablets (40 mEq total) by mouth daily. 04/02/21   Jerline Pain, MD  pregabalin (LYRICA) 100 MG capsule Take 100 mg by mouth daily.    [provider]  sacubitril-valsartan (ENTRESTO) 24-26 MG Take 1 tablet by mouth 2 (two) times daily. 02/26/22   Marylu Lund., NP  warfarin (COUMADIN) 5 MG tablet Take 1/2 to 1 tablet once daily as directed by [facility] 09/11/21   Jerline Pain, MD      Allergies    Amiodarone, Crestor [rosuvastatin calcium], and Lipitor [atorvastatin calcium]    Review of Systems   Review of Systems  Physical Exam Updated Vital Signs BP 115/68   Pulse 70   Temp 97.6 F (36.4 C) (Oral)   Resp 17   SpO2 95%  Physical Exam Vitals and nursing note reviewed.  Constitutional:      General: She is not in acute distress.    Appearance: She is well-developed. She is not ill-appearing, toxic-appearing or diaphoretic.  HENT:     Head: Normocephalic and atraumatic.     Right Ear: External ear normal.     Left Ear: External ear normal.  Eyes:     Conjunctiva/sclera: Conjunctivae normal.     Pupils: Pupils are equal, round, and reactive to light.  Neck:  Trachea: Phonation normal.  Cardiovascular:     Rate and Rhythm: Normal rate and regular rhythm.     Heart sounds: Normal heart sounds.  Pulmonary:     Effort: Pulmonary effort is normal. No respiratory distress.     Breath sounds: Normal breath sounds. No stridor. No rhonchi.  Abdominal:     General: There is no distension.     Palpations: Abdomen is soft.     Tenderness: There is no abdominal tenderness.  Musculoskeletal:        General: Normal range of motion.     Cervical back: Normal range of motion and neck supple.     Comments: Normal strength, arms and legs bilaterally.  Skin:    General: Skin is warm and dry.  Neurological:     Mental Status: She is alert and oriented to person, place, and time.      Cranial Nerves: No cranial nerve deficit.     Sensory: No sensory deficit.     Motor: No abnormal muscle tone.     Coordination: Coordination normal.     Comments: Patient is somewhat distracted but does answer questions and is responsive.  She is a poor historian.  Psychiatric:        Mood and Affect: Mood normal.        Behavior: Behavior normal.    ED Results / Procedures / Treatments   Labs (all labs ordered are listed, but only abnormal results are displayed) Labs Reviewed  COMPREHENSIVE METABOLIC PANEL - Abnormal; Notable for the following components:      Result Value   Glucose, Bld 152 (*)    BUN 25 (*)    Alkaline Phosphatase 130 (*)    GFR, Estimated 60 (*)    All other components within normal limits  CBG MONITORING, ED - Abnormal; Notable for the following components:   Glucose-Capillary 179 (*)    All other components within normal limits  URINE CULTURE  CBC WITH DIFFERENTIAL/PLATELET  URINALYSIS, ROUTINE W REFLEX MICROSCOPIC    EKG None  Radiology No results found.  Procedures Procedures    Medications Ordered in ED Medications  sodium chloride 0.9 % bolus 500 mL (500 mLs Intravenous Bolus 03/30/22 1932)    ED Course/ Medical Decision Making/ A&P                           Medical Decision Making Mild mental status changes, with decreased appetite and periods of hypoglycemia.  She is being transition from Lyrica to [employer].  She has been on the Lyrica for several months to treat neuropathy.  Problems Addressed: Withdrawal complaint: acute illness or injury    Details: Onset of symptoms after stopping Lyrica 7 days ago.  Amount and/or Complexity of Data Reviewed Independent Historian: caregiver    Details: Son gives history he is at the bedside Labs: ordered.    Details: CBC, metabolic panel, urinalysis, urine culture-normal except glucose high, alk phos stays high.  Note that urine culture was ordered due to mental status  changes.  Risk Decision regarding hospitalization. Risk Details: Patient presenting with multiple symptoms after EPIC Lyrica and and initiating duloxetine treatment for neuropathy.  Reassuring findings.  No evidence for acute bacterial infection, respiratory infection, metabolic disorders or other reasons for encephalopathy.  Patient's symptoms are controllable.  She has had some periods of low blood sugar which improved when she is encouraged to eat and drink Ensure.  She does not require  hospitalization.  She has a PCP for follow-up care who is monitoring her and helping to manage her treatment.  I have encouraged family members to support the patient, and follow-up with her PCP for further management and treatment as needed.  She does not require hospitalization at this time.           Final Clinical Impression(s) / ED Diagnoses Final diagnoses:  Withdrawal complaint    Rx / DC Orders ED Discharge Orders     None         Daleen Bo, MD 03/30/22 2122

## 2022-03-30 NOTE — ED Triage Notes (Signed)
Pt accompanied by son who reports that pt has become increasingly weak over the last few weeks. He tells of recent changes in her medications from gabapentin to lyrica to duloxetine. Pt A+Ox3 (missing time). Son also reports that he has been checking her CBG frequently and today machine initially read "LOW" and on re-check it read in the 60's. Pt has had poor appetite and was given Ensure with some improvement. NAD during triage.

## 2022-04-01 ENCOUNTER — Ambulatory Visit (INDEPENDENT_AMBULATORY_CARE_PROVIDER_SITE_OTHER): Payer: Medicare Other | Admitting: Podiatry

## 2022-04-01 ENCOUNTER — Encounter: Payer: Self-pay | Admitting: Podiatry

## 2022-04-01 DIAGNOSIS — M79674 Pain in right toe(s): Secondary | ICD-10-CM

## 2022-04-01 DIAGNOSIS — B351 Tinea unguium: Secondary | ICD-10-CM | POA: Diagnosis not present

## 2022-04-01 DIAGNOSIS — Z7901 Long term (current) use of anticoagulants: Secondary | ICD-10-CM

## 2022-04-01 DIAGNOSIS — E0843 Diabetes mellitus due to underlying condition with diabetic autonomic (poly)neuropathy: Secondary | ICD-10-CM

## 2022-04-01 DIAGNOSIS — M79675 Pain in left toe(s): Secondary | ICD-10-CM

## 2022-04-01 LAB — URINE CULTURE: Culture: NO GROWTH

## 2022-04-01 NOTE — Progress Notes (Signed)
  Subjective:  Patient ID: Natasha Livingston, female    DOB: 1934-05-05,   MRN: [id number]  Chief Complaint  Patient presents with   Debridement    Trim toenails   Diabetes    Last a1c was 7.5    88 y.o. female presents for concern of thickened elongated and painful nails that are difficult to trim and diabetic foot exam. Requesting to have them trimmed today. Relates burning and tingling in her feet for neuropathy and is currently taking gabapentin but switched to lyrica. . Patient is diabetic and last A1c was 7.5.   . Denies any other pedal complaints. Denies n/v/f/c.   PCP: Cyndi Bender PA-C  Past Medical History:  Diagnosis Date   Atrial fibrillation ([place])    Back pain    Bruises easily    Chest discomfort    Diverticulitis    Dizziness    DOE (dyspnea on exertion)    Epistaxis 06/16/2019   Forgetfulness    Hx of cardiovascular stress test    Lexiscan Myoview (12/15):  Apical lateral and anterolateral fixed defect, no ischemia, EF 54%; low risk   Hyperlipidemia    Hypertension    Hypothyroidism    Interstitial nephritis    Ischemic heart disease    MVP (mitral valve prolapse)    Neuropathy     Objective:  Physical Exam: Vascular: DP/PT pulses 2/4 bilateral. CFT <3 seconds. Absent hair growth on digits. Edema noted to bilateral lower extremities. Xerosis noted bilaterally.  Skin. No lacerations or abrasions bilateral feet. Nails 1-5 bilateral  are thickened discolored and elongated with subungual debris.  Musculoskeletal: MMT 5/5 bilateral lower extremities in DF, PF, Inversion and Eversion. Deceased ROM in DF of ankle joint.  Neurological: Sensation intact to light touch. Protective sensation diminished bilateral.     Assessment:   1. Long term (current) use of anticoagulants - on coumadin for afib   2. Diabetes mellitus due to underlying condition with diabetic autonomic neuropathy, unspecified whether long term insulin use (HCC)   3. Pain due to onychomycosis  of toenails of both feet       Plan:  Patient was evaluated and treated and all questions answered. -Discussed and educated patient on diabetic foot care, especially with  regards to the vascular, neurological and musculoskeletal systems.  -Stressed the importance of good glycemic control and the detriment of not  controlling glucose levels in relation to the foot. -Discussed supportive shoes at all times and checking feet regularly.  -Mechanically debrided all nails 1-5 bilateral using sterile nail nipper and filed with dremel without incident  -Continue with PCP for gapapentin and lyrica management.  -Answered all patient questions -Patient to return  in 3 months for at risk foot care -Patient advised to call the office if any problems or questions arise in the meantime.   Lorenda Peck, DPM

## 2022-04-03 ENCOUNTER — Ambulatory Visit (INDEPENDENT_AMBULATORY_CARE_PROVIDER_SITE_OTHER): Payer: Medicare Other

## 2022-04-03 ENCOUNTER — Ambulatory Visit (INDEPENDENT_AMBULATORY_CARE_PROVIDER_SITE_OTHER): Payer: Medicare Other | Admitting: Rheumatology

## 2022-04-03 ENCOUNTER — Encounter: Payer: Self-pay | Admitting: Rheumatology

## 2022-04-03 VITALS — BP 119/70 | HR 78 | Ht 67.0 in | Wt 143.0 lb

## 2022-04-03 DIAGNOSIS — Z8639 Personal history of other endocrine, nutritional and metabolic disease: Secondary | ICD-10-CM

## 2022-04-03 DIAGNOSIS — M19071 Primary osteoarthritis, right ankle and foot: Secondary | ICD-10-CM | POA: Diagnosis not present

## 2022-04-03 DIAGNOSIS — M79642 Pain in left hand: Secondary | ICD-10-CM | POA: Diagnosis not present

## 2022-04-03 DIAGNOSIS — I480 Paroxysmal atrial fibrillation: Secondary | ICD-10-CM | POA: Diagnosis not present

## 2022-04-03 DIAGNOSIS — M79641 Pain in right hand: Secondary | ICD-10-CM

## 2022-04-03 DIAGNOSIS — I5042 Chronic combined systolic (congestive) and diastolic (congestive) heart failure: Secondary | ICD-10-CM | POA: Diagnosis not present

## 2022-04-03 DIAGNOSIS — M19072 Primary osteoarthritis, left ankle and foot: Secondary | ICD-10-CM

## 2022-04-03 DIAGNOSIS — Z789 Other specified health status: Secondary | ICD-10-CM | POA: Diagnosis not present

## 2022-04-03 DIAGNOSIS — Z8739 Personal history of other diseases of the musculoskeletal system and connective tissue: Secondary | ICD-10-CM

## 2022-04-03 DIAGNOSIS — G629 Polyneuropathy, unspecified: Secondary | ICD-10-CM

## 2022-04-03 DIAGNOSIS — I119 Hypertensive heart disease without heart failure: Secondary | ICD-10-CM

## 2022-04-03 DIAGNOSIS — M19042 Primary osteoarthritis, left hand: Secondary | ICD-10-CM | POA: Diagnosis not present

## 2022-04-03 DIAGNOSIS — I10 Essential (primary) hypertension: Secondary | ICD-10-CM

## 2022-04-03 DIAGNOSIS — M19041 Primary osteoarthritis, right hand: Secondary | ICD-10-CM

## 2022-04-03 DIAGNOSIS — Z8719 Personal history of other diseases of the digestive system: Secondary | ICD-10-CM

## 2022-04-03 DIAGNOSIS — Z7901 Long term (current) use of anticoagulants: Secondary | ICD-10-CM | POA: Diagnosis not present

## 2022-04-03 DIAGNOSIS — I2510 Atherosclerotic heart disease of native coronary artery without angina pectoris: Secondary | ICD-10-CM | POA: Diagnosis not present

## 2022-04-03 DIAGNOSIS — N12 Tubulo-interstitial nephritis, not specified as acute or chronic: Secondary | ICD-10-CM

## 2022-04-03 DIAGNOSIS — Z8659 Personal history of other mental and behavioral disorders: Secondary | ICD-10-CM

## 2022-04-03 DIAGNOSIS — I341 Nonrheumatic mitral (valve) prolapse: Secondary | ICD-10-CM

## 2022-04-03 NOTE — Patient Instructions (Signed)
Hand Exercises Hand exercises can be helpful for almost anyone. These exercises can strengthen the hands, improve flexibility and movement, and increase blood flow to the hands. These results can make work and daily tasks easier. Hand exercises can be especially helpful for people who have joint pain from arthritis or have nerve damage from overuse (carpal tunnel syndrome). These exercises can also help people who have injured a hand. Exercises Most of these hand exercises are gentle stretching and motion exercises. It is usually safe to do them often throughout the day. Warming up your hands before exercise may help to reduce stiffness. You can do this with gentle massage or by placing your hands in warm water for 10-15 minutes. It is normal to feel some stretching, pulling, tightness, or mild discomfort as you begin new exercises. This will gradually improve. Stop an exercise right away if you feel sudden, severe pain or your pain gets worse. Ask your health care provider which exercises are best for you. Knuckle bend or "claw" fist  Stand or sit with your arm, hand, and all five fingers pointed straight up. Make sure to keep your wrist straight during the exercise. Gently bend your fingers down toward your palm until the tips of your fingers are touching the top of your palm. Keep your big knuckle straight and just bend the small knuckles in your fingers. Hold this position for __________ seconds. Straighten (extend) your fingers back to the starting position. Repeat this exercise 5-10 times with each hand. Full finger fist  Stand or sit with your arm, hand, and all five fingers pointed straight up. Make sure to keep your wrist straight during the exercise. Gently bend your fingers into your palm until the tips of your fingers are touching the middle of your palm. Hold this position for __________ seconds. Extend your fingers back to the starting position, stretching every joint fully. Repeat  this exercise 5-10 times with each hand. Straight fist Stand or sit with your arm, hand, and all five fingers pointed straight up. Make sure to keep your wrist straight during the exercise. Gently bend your fingers at the big knuckle, where your fingers meet your hand, and the middle knuckle. Keep the knuckle at the tips of your fingers straight and try to touch the bottom of your palm. Hold this position for __________ seconds. Extend your fingers back to the starting position, stretching every joint fully. Repeat this exercise 5-10 times with each hand. Tabletop  Stand or sit with your arm, hand, and all five fingers pointed straight up. Make sure to keep your wrist straight during the exercise. Gently bend your fingers at the big knuckle, where your fingers meet your hand, as far down as you can while keeping the small knuckles in your fingers straight. Think of forming a tabletop with your fingers. Hold this position for __________ seconds. Extend your fingers back to the starting position, stretching every joint fully. Repeat this exercise 5-10 times with each hand. Finger spread  Place your hand flat on a table with your palm facing down. Make sure your wrist stays straight as you do this exercise. Spread your fingers and thumb apart from each other as far as you can until you feel a gentle stretch. Hold this position for __________ seconds. Bring your fingers and thumb tight together again. Hold this position for __________ seconds. Repeat this exercise 5-10 times with each hand. Making circles  Stand or sit with your arm, hand, and all five fingers pointed   straight up. Make sure to keep your wrist straight during the exercise. Make a circle by touching the tip of your thumb to the tip of your index finger. Hold for __________ seconds. Then open your hand wide. Repeat this motion with your thumb and each finger on your hand. Repeat this exercise 5-10 times with each hand. Thumb  motion  Sit with your forearm resting on a table and your wrist straight. Your thumb should be facing up toward the ceiling. Keep your fingers relaxed as you move your thumb. Lift your thumb up as high as you can toward the ceiling. Hold for __________ seconds. Bend your thumb across your palm as far as you can, reaching the tip of your thumb for the small finger (pinkie) side of your palm. Hold for __________ seconds. Repeat this exercise 5-10 times with each hand. Grip strengthening  Hold a stress ball or other soft ball in the middle of your hand. Slowly increase the pressure, squeezing the ball as much as you can without causing pain. Think of bringing the tips of your fingers into the middle of your palm. All of your finger joints should bend when doing this exercise. Hold your squeeze for __________ seconds, then relax. Repeat this exercise 5-10 times with each hand. Contact a health care provider if: Your hand pain or discomfort gets much worse when you do an exercise. Your hand pain or discomfort does not improve within 2 hours after you exercise. If you have any of these problems, stop doing these exercises right away. Do not do them again unless your health care provider says that you can. Get help right away if: You develop sudden, severe hand pain or swelling. If this happens, stop doing these exercises right away. Do not do them again unless your health care provider says that you can. This information is not intended to replace advice given to you by your health care provider. Make sure you discuss any questions you have with your health care provider. Document Revised: 02/07/2021 Document Reviewed: 02/07/2021 Elsevier Patient Education  2023 [employer]  

## 2022-04-04 LAB — CYCLIC CITRUL PEPTIDE ANTIBODY, IGG: Cyclic Citrullin Peptide Ab: <16 UNITS

## 2022-04-04 LAB — SEDIMENTATION RATE: Sed Rate: 14 mm/h (ref 0–30)

## 2022-04-04 LAB — RHEUMATOID FACTOR: Rheumatoid fact SerPl-aCnc: <14 IU/mL (ref <14)

## 2022-04-04 LAB — URIC ACID: Uric Acid, Serum: 3.2 mg/dL (ref 2.5–7.0)

## 2022-04-06 NOTE — Progress Notes (Signed)
All the labs are within normal limits.  I will discuss results at the follow-up visit.

## 2022-04-08 ENCOUNTER — Other Ambulatory Visit: Payer: Self-pay | Admitting: Cardiology

## 2022-04-09 ENCOUNTER — Ambulatory Visit (INDEPENDENT_AMBULATORY_CARE_PROVIDER_SITE_OTHER): Payer: Medicare Other | Admitting: *Deleted

## 2022-04-09 DIAGNOSIS — Z5181 Encounter for therapeutic drug level monitoring: Secondary | ICD-10-CM | POA: Diagnosis not present

## 2022-04-09 DIAGNOSIS — M461 Sacroiliitis, not elsewhere classified: Secondary | ICD-10-CM | POA: Diagnosis not present

## 2022-04-09 DIAGNOSIS — I4891 Unspecified atrial fibrillation: Secondary | ICD-10-CM

## 2022-04-09 DIAGNOSIS — M545 Low back pain, unspecified: Secondary | ICD-10-CM | POA: Diagnosis not present

## 2022-04-09 LAB — POCT INR: INR: 2.7 (ref 2.0–3.0)

## 2022-04-09 NOTE — Patient Instructions (Signed)
Description   Continue taking 1/2 tablet daily except 1 tablet on Mondays, Wednesdays and Fridays.   Recheck INR in 6 weeks. Call our office if you have any questions or unusual bleeding [phone].

## 2022-04-12 NOTE — Progress Notes (Signed)
Office Visit Note  Patient: Natasha Livingston             Date of Birth: [birth year]           MRN: [id number]             PCP: Cyndi Bender, PA-C Referring: Cyndi Bender, PA-C Visit Date: 04/24/2022 Occupation: _0 @  Subjective:  Pain in both hands and feet  History of Present Illness: Natasha Livingston is a 88 y.o. female with history of osteoarthritis and gout.  She returns a follow-up visit.  She was accompanied by her son.  She states she has severe pain and discomfort in her bilateral hands.  She has been using hand warmer.  She has not noticed any joint swelling.  She continues to have discomfort in her bilateral feet.  She tried pregabalin but could not tolerate the side effects.  She was also given Cymbalta 60 mg p.o. daily which she could not tolerate.  She has a new prescription for Cymbalta 30 mg p.o. daily.  She has not started that yet.  She has not had a gout flare in a long time.  Activities of Daily Living:  Patient reports morning stiffness for 10 minutes.   Patient Denies nocturnal pain.  Difficulty dressing/grooming: Reports Difficulty climbing stairs: Reports Difficulty getting out of chair: Reports Difficulty using hands for taps, buttons, cutlery, and/or writing: Reports  Review of Systems  Constitutional:  Positive for fatigue.  HENT:  Positive for mouth dryness.   Eyes:  Positive for redness.  Respiratory:  Positive for shortness of breath.   Cardiovascular:  Negative for swelling in legs/feet.  Gastrointestinal:  Negative for constipation.  Endocrine: Positive for cold intolerance and excessive thirst.  Genitourinary:  Negative for difficulty urinating.  Musculoskeletal:  Positive for joint pain, gait problem, joint pain and morning stiffness. Negative for joint swelling.  Skin:  Negative for rash.  Allergic/Immunologic: Negative for susceptible to infections.  Neurological:  Negative for numbness.  Hematological:  Negative for bruising/bleeding  tendency.  Psychiatric/Behavioral:  Negative for sleep disturbance.     PMFS History:  Patient Active Problem List   Diagnosis Date Noted   Coronary artery disease involving native coronary artery of native heart without angina pectoris 09/02/2021   Falls 09/02/2021   Statin intolerance 09/02/2021   CAP (community acquired pneumonia) 02/19/2021   Chronic combined systolic and diastolic CHF (congestive heart failure) ([place]) - echo 02-2018 LVEF 35-40% 02/18/2021   Hyperglycemia 02/18/2021   Onychomycosis of left great toe 09/10/2015   Encounter for therapeutic drug monitoring 12/07/2013   Depression 12/07/2013   Paroxysmal atrial fibrillation ([place]) 08/18/2013   Hypothyroid 04/20/2013   Dermatitis 12/31/2012   Benign hypertensive heart disease without heart failure 12/24/2011   Long term (current) use of anticoagulants - on coumadin for afib 08/27/2011   Edema of foot 04/28/2011   Malaise and fatigue 04/15/2011   Chest discomfort    Dizziness    MVP (mitral valve prolapse)    Right upper lobe pneumonia    Bruises easily    Back pain    Forgetfulness    Atrial fibrillation ([place])    Hypertension    Hypothyroidism    Aortic stenosis    Hyperlipidemia    Diverticulitis    Interstitial nephritis    Neuropathy    Ischemic heart disease    Atrial fibrillation ([place]) 02/03/2011    Past Medical History:  Diagnosis Date   Atrial fibrillation ([place])  Back pain    Bruises easily    Chest discomfort    Diverticulitis    Dizziness    DOE (dyspnea on exertion)    Epistaxis 06/16/2019   Forgetfulness    Hx of cardiovascular stress test    Lexiscan Myoview (12/15):  Apical lateral and anterolateral fixed defect, no ischemia, EF 54%; low risk   Hyperlipidemia    Hypertension    Hypothyroidism    Interstitial nephritis    Ischemic heart disease    MVP (mitral valve prolapse)    Neuropathy     Family History  Problem Relation Age of Onset   Lung cancer Mother    Cerebral  aneurysm Father    Hypertension Father    Cerebral aneurysm Other    Healthy Son    Healthy Son    Prostate cancer Son    Heart attack Neg Hx    Stroke Neg Hx    Past Surgical History:  Procedure Laterality Date   CARDIOVERSION  08/2008   CORONARY ARTERY BYPASS GRAFT  04/2007   HAND SURGERY Right    OVARIAN CYST REMOVAL     PARTIAL HYSTERECTOMY     1970s   skin cancer removal     Social History   Social History Narrative   Not on file   Immunization History  Administered Date(s) Administered   Influenza-Unspecified 08/03/2013, 08/03/2014, 08/03/2018     Objective: Vital Signs: BP 129/70 (BP Location: Left Arm, Patient Position: Sitting, Cuff Size: Small)   Pulse 89   Resp 12   Ht _0  (1.702 m)   Wt 139 lb 9.6 oz (63.3 kg)   BMI 21.86 kg/m    Physical Exam Vitals and nursing note reviewed.  Constitutional:      Appearance: She is well-developed.  HENT:     Head: Normocephalic and atraumatic.  Eyes:     Conjunctiva/sclera: Conjunctivae normal.  Cardiovascular:     Rate and Rhythm: Normal rate and regular rhythm.     Heart sounds: Normal heart sounds.  Pulmonary:     Effort: Pulmonary effort is normal.     Breath sounds: Normal breath sounds.  Abdominal:     General: Bowel sounds are normal.     Palpations: Abdomen is soft.  Musculoskeletal:     Cervical back: Normal range of motion.  Lymphadenopathy:     Cervical: No cervical adenopathy.  Skin:    General: Skin is warm and dry.     Capillary Refill: Capillary refill takes less than 2 seconds.  Neurological:     Mental Status: She is alert and oriented to person, place, and time.  Psychiatric:        Behavior: Behavior normal.      Musculoskeletal Exam: C-spine was in good range of motion.  Shoulder joints, elbow joints, wrist joints with good range of motion.  She had right CMC subluxation without any swelling.  She had bilateral PIP and DIP thickening with no synovitis.  She had good range of  motion of her bilateral knee joints, ankles.  She had bilateral hammertoes without synovitis.  CDAI Exam: CDAI Score: -- Patient Global: --; Provider Global: -- Swollen: --; Tender: -- Joint Exam 04/24/2022   No joint exam has been documented for this visit   There is currently no information documented on the homunculus. Go to the Rheumatology activity and complete the homunculus joint exam.  Investigation: No additional findings.  Imaging: XR Hand 2 View Left  Result Date: 04/03/2022  CMC narrowing and subluxation was noted.  PIP and DIP narrowing was noted.  Erosive changes were noted in the fifth PIP joint.  Severe narrowing of all DIP joints with ankylosis of the fourth DIP joint was noted.  No MCP, intercarpal or radiocarpal joint space narrowing was noted.  Impression: These findings are consistent with severe inflammatory osteoarthritis.  XR Hand 2 View Right  Result Date: 04/03/2022 Severe CMC narrowing and subluxation with spurring was noted.  PIP and DIP narrowing was noted.  Erosive changes noted in the right fifth finger DIP joint.  No intercarpal or radiocarpal narrowing was noted.  No MCP narrowing was noted.  Calcification was noted in the wrist joint which raises concern about possible chondrocalcinosis. Impression: These findings are consistent with severe osteoarthritis of the hand.   Recent Labs: Lab Results  Component Value Date   WBC 5.6 03/30/2022   HGB 13.9 03/30/2022   PLT 160 03/30/2022   NA 137 03/30/2022   K 4.9 03/30/2022   CL 102 03/30/2022   CO2 26 03/30/2022   GLUCOSE 152 (H) 03/30/2022   BUN 25 (H) 03/30/2022   CREATININE 0.93 03/30/2022   BILITOT 1.2 03/30/2022   ALKPHOS 130 (H) 03/30/2022   AST 29 03/30/2022   ALT 17 03/30/2022   PROT 7.7 03/30/2022   ALBUMIN 3.9 03/30/2022   CALCIUM 10.0 03/30/2022   GFRAA 57 (L) 07/31/2020     Speciality Comments: No specialty comments available.  Procedures:  No procedures performed Allergies:  Amiodarone, Crestor [rosuvastatin calcium], and Lipitor [atorvastatin calcium]   Assessment / Plan:     Visit Diagnoses: Primary osteoarthritis of both hands - Clinical and radiographic findings were consistent with inflammatory erosive osteoarthritis.  Calcification was noted in the right wrist joint which could be consistent with CPPD.x-ray findings were discussed with the patient.  She had no synovitis on my examination.  No inflammation was noted.  April 03, 2022 ESR 14, uric acid 3.2, RF negative, anti-CCP negative.  Lab findings were discussed with the patient.  Detailed counsel regarding osteoarthritis was provided.  Joint protection and muscle strengthening was discussed.  She had right CMC arthritis and bilateral PIP and DIP thickening with severe pain.  She has been using hand warmers.  Use of compression gloves was discussed.  I will also refer her to physical therapy and Occupational Therapy.  Handout on hand exercises was given.  Primary osteoarthritis of both feet - Clinical findings are consistent with osteoarthritis bilateral hammertoes.  All autoimmune work-up negative.  Proper fitting shoes were advised.  History of gout - She is on allopurinol 300 mg p.o. daily and takes colchicine only on as needed basis.  She has not had any gout flares.  Uric acid was in desirable range.  History of osteopenia-she takes calcium and vitamin D.  Primary hypertension-blood pressure was normal today.  Other medical problems are listed as follows:  MVP (mitral valve prolapse)  Paroxysmal atrial fibrillation ([facility])  Long term (current) use of anticoagulants - on coumadin for afib  Chronic combined systolic and diastolic CHF (congestive heart failure) ([place]) - echo 02-2018 LVEF 35-40%  Coronary artery disease involving native coronary artery of native heart without angina pectoris  Statin intolerance  History of hyperlipidemia  History of type 2 diabetes mellitus  Neuropathy  History of  gastroesophageal reflux (GERD)  History of diverticulitis  Interstitial nephritis  History of hypothyroidism  History of depression  Orders: No orders of the defined types were placed in this encounter.  No orders of the defined types were placed in this encounter.    Follow-Up Instructions: Return if symptoms worsen or fail to improve, for Osteoarthritis.   Bo Merino, MD  Note - This record has been created using Editor, commissioning.  Chart creation errors have been sought, but may not always  have been located. Such creation errors do not reflect on  the standard of medical care.

## 2022-04-24 ENCOUNTER — Encounter: Payer: Self-pay | Admitting: Rheumatology

## 2022-04-24 ENCOUNTER — Ambulatory Visit (INDEPENDENT_AMBULATORY_CARE_PROVIDER_SITE_OTHER): Payer: Medicare Other | Admitting: Rheumatology

## 2022-04-24 ENCOUNTER — Telehealth: Payer: Self-pay | Admitting: *Deleted

## 2022-04-24 VITALS — BP 129/70 | HR 89 | Resp 12 | Ht 67.0 in | Wt 139.6 lb

## 2022-04-24 DIAGNOSIS — I2510 Atherosclerotic heart disease of native coronary artery without angina pectoris: Secondary | ICD-10-CM | POA: Diagnosis not present

## 2022-04-24 DIAGNOSIS — Z8639 Personal history of other endocrine, nutritional and metabolic disease: Secondary | ICD-10-CM | POA: Diagnosis not present

## 2022-04-24 DIAGNOSIS — I341 Nonrheumatic mitral (valve) prolapse: Secondary | ICD-10-CM

## 2022-04-24 DIAGNOSIS — G629 Polyneuropathy, unspecified: Secondary | ICD-10-CM | POA: Diagnosis not present

## 2022-04-24 DIAGNOSIS — I5042 Chronic combined systolic (congestive) and diastolic (congestive) heart failure: Secondary | ICD-10-CM | POA: Diagnosis not present

## 2022-04-24 DIAGNOSIS — Z8659 Personal history of other mental and behavioral disorders: Secondary | ICD-10-CM

## 2022-04-24 DIAGNOSIS — Z8739 Personal history of other diseases of the musculoskeletal system and connective tissue: Secondary | ICD-10-CM | POA: Diagnosis not present

## 2022-04-24 DIAGNOSIS — Z789 Other specified health status: Secondary | ICD-10-CM | POA: Diagnosis not present

## 2022-04-24 DIAGNOSIS — M19041 Primary osteoarthritis, right hand: Secondary | ICD-10-CM

## 2022-04-24 DIAGNOSIS — M19042 Primary osteoarthritis, left hand: Secondary | ICD-10-CM

## 2022-04-24 DIAGNOSIS — N12 Tubulo-interstitial nephritis, not specified as acute or chronic: Secondary | ICD-10-CM

## 2022-04-24 DIAGNOSIS — Z7901 Long term (current) use of anticoagulants: Secondary | ICD-10-CM | POA: Diagnosis not present

## 2022-04-24 DIAGNOSIS — M19071 Primary osteoarthritis, right ankle and foot: Secondary | ICD-10-CM | POA: Diagnosis not present

## 2022-04-24 DIAGNOSIS — Z8719 Personal history of other diseases of the digestive system: Secondary | ICD-10-CM

## 2022-04-24 DIAGNOSIS — I10 Essential (primary) hypertension: Secondary | ICD-10-CM | POA: Diagnosis not present

## 2022-04-24 DIAGNOSIS — I480 Paroxysmal atrial fibrillation: Secondary | ICD-10-CM | POA: Diagnosis not present

## 2022-04-24 DIAGNOSIS — M19072 Primary osteoarthritis, left ankle and foot: Secondary | ICD-10-CM

## 2022-04-24 NOTE — Patient Instructions (Signed)
Hand Exercises Hand exercises can be helpful for almost anyone. These exercises can strengthen the hands, improve flexibility and movement, and increase blood flow to the hands. These results can make work and daily tasks easier. Hand exercises can be especially helpful for people who have joint pain from arthritis or have nerve damage from overuse (carpal tunnel syndrome). These exercises can also help people who have injured a hand. Exercises Most of these hand exercises are gentle stretching and motion exercises. It is usually safe to do them often throughout the day. Warming up your hands before exercise may help to reduce stiffness. You can do this with gentle massage or by placing your hands in warm water for 10-15 minutes. It is normal to feel some stretching, pulling, tightness, or mild discomfort as you begin new exercises. This will gradually improve. Stop an exercise right away if you feel sudden, severe pain or your pain gets worse. Ask your health care provider which exercises are best for you. Knuckle bend or "claw" fist  Stand or sit with your arm, hand, and all five fingers pointed straight up. Make sure to keep your wrist straight during the exercise. Gently bend your fingers down toward your palm until the tips of your fingers are touching the top of your palm. Keep your big knuckle straight and just bend the small knuckles in your fingers. Hold this position for __________ seconds. Straighten (extend) your fingers back to the starting position. Repeat this exercise 5-10 times with each hand. Full finger fist  Stand or sit with your arm, hand, and all five fingers pointed straight up. Make sure to keep your wrist straight during the exercise. Gently bend your fingers into your palm until the tips of your fingers are touching the middle of your palm. Hold this position for __________ seconds. Extend your fingers back to the starting position, stretching every joint fully. Repeat  this exercise 5-10 times with each hand. Straight fist Stand or sit with your arm, hand, and all five fingers pointed straight up. Make sure to keep your wrist straight during the exercise. Gently bend your fingers at the big knuckle, where your fingers meet your hand, and the middle knuckle. Keep the knuckle at the tips of your fingers straight and try to touch the bottom of your palm. Hold this position for __________ seconds. Extend your fingers back to the starting position, stretching every joint fully. Repeat this exercise 5-10 times with each hand. Tabletop  Stand or sit with your arm, hand, and all five fingers pointed straight up. Make sure to keep your wrist straight during the exercise. Gently bend your fingers at the big knuckle, where your fingers meet your hand, as far down as you can while keeping the small knuckles in your fingers straight. Think of forming a tabletop with your fingers. Hold this position for __________ seconds. Extend your fingers back to the starting position, stretching every joint fully. Repeat this exercise 5-10 times with each hand. Finger spread  Place your hand flat on a table with your palm facing down. Make sure your wrist stays straight as you do this exercise. Spread your fingers and thumb apart from each other as far as you can until you feel a gentle stretch. Hold this position for __________ seconds. Bring your fingers and thumb tight together again. Hold this position for __________ seconds. Repeat this exercise 5-10 times with each hand. Making circles  Stand or sit with your arm, hand, and all five fingers pointed   straight up. Make sure to keep your wrist straight during the exercise. Make a circle by touching the tip of your thumb to the tip of your index finger. Hold for __________ seconds. Then open your hand wide. Repeat this motion with your thumb and each finger on your hand. Repeat this exercise 5-10 times with each hand. Thumb  motion  Sit with your forearm resting on a table and your wrist straight. Your thumb should be facing up toward the ceiling. Keep your fingers relaxed as you move your thumb. Lift your thumb up as high as you can toward the ceiling. Hold for __________ seconds. Bend your thumb across your palm as far as you can, reaching the tip of your thumb for the small finger (pinkie) side of your palm. Hold for __________ seconds. Repeat this exercise 5-10 times with each hand. Grip strengthening  Hold a stress ball or other soft ball in the middle of your hand. Slowly increase the pressure, squeezing the ball as much as you can without causing pain. Think of bringing the tips of your fingers into the middle of your palm. All of your finger joints should bend when doing this exercise. Hold your squeeze for __________ seconds, then relax. Repeat this exercise 5-10 times with each hand. Contact a health care provider if: Your hand pain or discomfort gets much worse when you do an exercise. Your hand pain or discomfort does not improve within 2 hours after you exercise. If you have any of these problems, stop doing these exercises right away. Do not do them again unless your health care provider says that you can. Get help right away if: You develop sudden, severe hand pain or swelling. If this happens, stop doing these exercises right away. Do not do them again unless your health care provider says that you can. This information is not intended to replace advice given to you by your health care provider. Make sure you discuss any questions you have with your health care provider. Document Revised: 02/07/2021 Document Reviewed: 02/07/2021 Elsevier Patient Education  2023 [employer]  

## 2022-04-24 NOTE — Telephone Encounter (Signed)
Referral placed for PT/OT per Dr. Estanislado Pandy.

## 2022-05-07 DIAGNOSIS — M545 Low back pain, unspecified: Secondary | ICD-10-CM | POA: Diagnosis not present

## 2022-05-10 ENCOUNTER — Other Ambulatory Visit: Payer: Self-pay | Admitting: Cardiology

## 2022-05-12 ENCOUNTER — Other Ambulatory Visit: Payer: Self-pay | Admitting: Cardiology

## 2022-05-12 NOTE — Telephone Encounter (Deleted)
Received refill request for Warfarin:  Last INR was 2.7 on 04/09/22 Next INR due on 05/14/22 LOV was 02/26/22  Elvin So NP  Refill approved.

## 2022-05-14 ENCOUNTER — Ambulatory Visit (INDEPENDENT_AMBULATORY_CARE_PROVIDER_SITE_OTHER): Payer: Medicare Other

## 2022-05-14 DIAGNOSIS — Z5181 Encounter for therapeutic drug level monitoring: Secondary | ICD-10-CM | POA: Diagnosis not present

## 2022-05-14 DIAGNOSIS — I4891 Unspecified atrial fibrillation: Secondary | ICD-10-CM | POA: Diagnosis not present

## 2022-05-14 LAB — POCT INR: INR: 2.3 (ref 2.0–3.0)

## 2022-05-14 NOTE — Patient Instructions (Signed)
Description   Continue taking 1/2 tablet daily except 1 tablet on Mondays, Wednesdays and Fridays.   Recheck INR in 4 weeks. Call our office if you have any questions or unusual bleeding [phone].

## 2022-05-23 DIAGNOSIS — N390 Urinary tract infection, site not specified: Secondary | ICD-10-CM | POA: Diagnosis not present

## 2022-06-01 ENCOUNTER — Ambulatory Visit
Admission: EM | Admit: 2022-06-01 | Discharge: 2022-06-01 | Disposition: A | Discharge status: Home / Self Care | Payer: Medicare Other | Attending: Physician Assistant | Admitting: Physician Assistant

## 2022-06-01 ENCOUNTER — Encounter: Payer: Self-pay | Admitting: Emergency Medicine

## 2022-06-01 DIAGNOSIS — J069 Acute upper respiratory infection, unspecified: Secondary | ICD-10-CM

## 2022-06-01 MED ORDER — BENZONATATE 100 MG PO CAPS: 100.0000 mg | ORAL_CAPSULE | Freq: Three times a day (TID) | ORAL | 0 refills | Status: AC

## 2022-06-01 NOTE — ED Provider Notes (Signed)
[facility]    CSN: [id number] Arrival date & time: 06/01/22  1418      History   Chief Complaint Chief Complaint  Patient presents with   Cough   Nasal Congestion    Chest congestion     HPI Natasha Livingston is a 88 y.o. female.   Pt complains of cough and congestion that started about one week ago.  She denies fever, chills, shortness of breath, wheezing, night sweats.  She denies sinus pressure or pain.  She has been taking mucinex with minimal improvement.  No known sick contacts.     Past Medical History:  Diagnosis Date   Atrial fibrillation ([place])    Back pain    Bruises easily    Chest discomfort    Diverticulitis    Dizziness    DOE (dyspnea on exertion)    Epistaxis 06/16/2019   Forgetfulness    Hx of cardiovascular stress test    Lexiscan Myoview (12/15):  Apical lateral and anterolateral fixed defect, no ischemia, EF 54%; low risk   Hyperlipidemia    Hypertension    Hypothyroidism    Interstitial nephritis    Ischemic heart disease    MVP (mitral valve prolapse)    Neuropathy     Patient Active Problem List   Diagnosis Date Noted   Coronary artery disease involving native coronary artery of native heart without angina pectoris 09/02/2021   Falls 09/02/2021   Statin intolerance 09/02/2021   CAP (community acquired pneumonia) 02/19/2021   Chronic combined systolic and diastolic CHF (congestive heart failure) ([place]) - echo 02-2018 LVEF 35-40% 02/18/2021   Hyperglycemia 02/18/2021   Onychomycosis of left great toe 09/10/2015   Encounter for therapeutic drug monitoring 12/07/2013   Depression 12/07/2013   Paroxysmal atrial fibrillation ([place]) 08/18/2013   Hypothyroid 04/20/2013   Dermatitis 12/31/2012   Benign hypertensive heart disease without heart failure 12/24/2011   Long term (current) use of anticoagulants - on coumadin for afib 08/27/2011   Edema of foot 04/28/2011   Malaise and fatigue 04/15/2011   Chest discomfort     Dizziness    MVP (mitral valve prolapse)    Right upper lobe pneumonia    Bruises easily    Back pain    Forgetfulness    Atrial fibrillation ([place])    Hypertension    Hypothyroidism    Aortic stenosis    Hyperlipidemia    Diverticulitis    Interstitial nephritis    Neuropathy    Ischemic heart disease    Atrial fibrillation ([place]) 02/03/2011    Past Surgical History:  Procedure Laterality Date   CARDIOVERSION  08/2008   CORONARY ARTERY BYPASS GRAFT  04/2007   HAND SURGERY Right    OVARIAN CYST REMOVAL     PARTIAL HYSTERECTOMY     1970s   skin cancer removal      OB History   No obstetric history on file.      Home Medications    Prior to Admission medications   Medication Sig Start Date End Date Taking? Authorizing Provider  benzonatate (TESSALON) 100 MG capsule Take 1 capsule (100 mg total) by mouth every 8 (eight) hours. 06/01/22  Yes Ward, Lenise Arena, PA-C  acetaminophen (TYLENOL) 500 MG tablet Take 500 mg by mouth every 6 (six) hours as needed for moderate pain (For pain.).    [provider]  allopurinol (ZYLOPRIM) 300 MG tablet Take 300 mg by mouth daily.  01/03/18   [provider]  colchicine 0.6 MG tablet Take 0.6 mg by mouth daily as needed (For gout flare-ups.).     [provider]  DULoxetine (CYMBALTA) 60 MG capsule Take 60 mg by mouth daily. Patient not taking: Reported on 04/03/2022 03/12/22   [provider]  furosemide (LASIX) 40 MG tablet TAKE 1 TABLET BY MOUTH TWICE A DAY 05/12/22   Jerline Pain, MD  KLOR-CON M20 20 MEQ tablet TAKE 2 TABLETS BY MOUTH DAILY 04/08/22   Jerline Pain, MD  levothyroxine (SYNTHROID) 25 MCG tablet Take 25 mcg by mouth daily before breakfast.    [provider]  metoprolol succinate (TOPROL-XL) 50 MG 24 hr tablet TAKE 1 TABLET BY MOUTH TWICE A DAY WITH OR IMMEDIATELY FOLLOWING A MEAL 05/12/22   Jerline Pain, MD  nitroGLYCERIN (NITROSTAT) 0.4 MG SL tablet Place 1 tablet (0.4 mg total)  under the tongue every 5 (five) minutes as needed for chest pain. 03/28/21   Jerline Pain, MD  sacubitril-valsartan (ENTRESTO) 24-26 MG Take 1 tablet by mouth 2 (two) times daily. 02/26/22   Marylu Lund., NP  warfarin (COUMADIN) 5 MG tablet TAKE 1/2 TO 1 TABLET ONCE DAILY AS DIRECTED BY COUMADIN CLINIC 05/12/22   Jerline Pain, MD    Family History Family History  Problem Relation Age of Onset   Lung cancer Mother    Cerebral aneurysm Father    Hypertension Father    Cerebral aneurysm Other    Healthy Son    Healthy Son    Prostate cancer Son    Heart attack Neg Hx    Stroke Neg Hx     Social History Social History   Tobacco Use   Smoking status: Never    Passive exposure: Never   Smokeless tobacco: Never  Vaping Use   Vaping Use: Never used  Substance Use Topics   Alcohol use: No   Drug use: No     Allergies   Amiodarone, Crestor [rosuvastatin calcium], and Lipitor [atorvastatin calcium]   Review of Systems Review of Systems  Constitutional:  Negative for chills and fever.  HENT:  Positive for congestion. Negative for ear pain and sore throat.   Eyes:  Negative for pain and visual disturbance.  Respiratory:  Positive for cough. Negative for shortness of breath.   Cardiovascular:  Negative for chest pain and palpitations.  Gastrointestinal:  Negative for abdominal pain and vomiting.  Genitourinary:  Negative for dysuria and hematuria.  Musculoskeletal:  Negative for arthralgias and back pain.  Skin:  Negative for color change and rash.  Neurological:  Negative for seizures and syncope.  All other systems reviewed and are negative.    Physical Exam Triage Vital Signs ED Triage Vitals  Enc Vitals Group     BP 06/01/22 1435 131/77     Pulse Rate 06/01/22 1435 95     Resp 06/01/22 1435 16     Temp 06/01/22 1435 (!) 97.5 F (36.4 C)     Temp src --      SpO2 06/01/22 1435 95 %     Weight --      Height --      Head Circumference --      Peak Flow  --      Pain Score 06/01/22 1434 0     Pain Loc --      Pain Edu? --      Excl. in [place]? --    No data found.  Updated  Vital Signs BP 131/77   Pulse 95   Temp (!) 97.5 F (36.4 C)   Resp 16   SpO2 95%   Visual Acuity Right Eye Distance:   Left Eye Distance:   Bilateral Distance:    Right Eye Near:   Left Eye Near:    Bilateral Near:     Physical Exam Vitals and nursing note reviewed.  Constitutional:      General: She is not in acute distress.    Appearance: She is well-developed.  HENT:     Head: Normocephalic and atraumatic.  Eyes:     Conjunctiva/sclera: Conjunctivae normal.  Cardiovascular:     Rate and Rhythm: Normal rate and regular rhythm.     Heart sounds: No murmur heard. Pulmonary:     Effort: Pulmonary effort is normal. No respiratory distress.     Breath sounds: Normal breath sounds.  Abdominal:     Palpations: Abdomen is soft.     Tenderness: There is no abdominal tenderness.  Musculoskeletal:        General: No swelling.     Cervical back: Neck supple.  Skin:    General: Skin is warm and dry.     Capillary Refill: Capillary refill takes less than 2 seconds.  Neurological:     Mental Status: She is alert.  Psychiatric:        Mood and Affect: Mood normal.      UC Treatments / Results  Labs (all labs ordered are listed, but only abnormal results are displayed) Labs Reviewed - No data to display  EKG   Radiology No results found.  Procedures Procedures (including critical care time)  Medications Ordered in UC Medications - No data to display  Initial Impression / Assessment and Plan / UC Course  I have reviewed the triage vital signs and the nursing notes.  Pertinent labs & imaging results that were available during my care of the patient were reviewed by me and considered in my medical decision making (see chart for details).     Viral URI with cough.  Pt overall well appearing, lungs clear, vitals wnl.  Tessalon prescribed.   Advised Mucinex and Flonase.  Return precautions discussed.  Final Clinical Impressions(s) / UC Diagnoses   Final diagnoses:  Viral URI with cough     Discharge Instructions      Take tessalon pearls as needed for cough Can take Mucinex and use Flonase Drink plenty of fluids Return if you develop worsening symptoms.    ED Prescriptions     Medication Sig Dispense Auth. Provider   benzonatate (TESSALON) 100 MG capsule Take 1 capsule (100 mg total) by mouth every 8 (eight) hours. 21 capsule Ward, Lenise Arena, PA-C      PDMP not reviewed this encounter.   Ward, Lenise Arena, PA-C 06/01/22 1556

## 2022-06-01 NOTE — ED Triage Notes (Signed)
Pt is present today with c/o cough, chest congestion, and nasal congestion. Pt sx started x6 days ago

## 2022-06-01 NOTE — Discharge Instructions (Signed)
Take tessalon pearls as needed for cough Can take Mucinex and use Flonase Drink plenty of fluids Return if you develop worsening symptoms.

## 2022-06-11 ENCOUNTER — Ambulatory Visit (INDEPENDENT_AMBULATORY_CARE_PROVIDER_SITE_OTHER): Payer: Medicare Other

## 2022-06-11 DIAGNOSIS — Z5181 Encounter for therapeutic drug level monitoring: Secondary | ICD-10-CM | POA: Diagnosis not present

## 2022-06-11 DIAGNOSIS — I4891 Unspecified atrial fibrillation: Secondary | ICD-10-CM | POA: Diagnosis not present

## 2022-06-11 LAB — POCT INR: INR: 2.4 (ref 2.0–3.0)

## 2022-06-11 NOTE — Patient Instructions (Signed)
Description   Continue taking 1/2 tablet daily except 1 tablet on Mondays, Wednesdays and Fridays.  Recheck INR in 5 weeks. Call our office if you have any questions or unusual bleeding [phone].

## 2022-06-26 DIAGNOSIS — E039 Hypothyroidism, unspecified: Secondary | ICD-10-CM | POA: Diagnosis not present

## 2022-06-26 DIAGNOSIS — I2510 Atherosclerotic heart disease of native coronary artery without angina pectoris: Secondary | ICD-10-CM | POA: Diagnosis not present

## 2022-06-26 DIAGNOSIS — Z79899 Other long term (current) drug therapy: Secondary | ICD-10-CM | POA: Diagnosis not present

## 2022-06-26 DIAGNOSIS — M109 Gout, unspecified: Secondary | ICD-10-CM | POA: Diagnosis not present

## 2022-06-26 DIAGNOSIS — E1159 Type 2 diabetes mellitus with other circulatory complications: Secondary | ICD-10-CM | POA: Diagnosis not present

## 2022-06-26 DIAGNOSIS — I4891 Unspecified atrial fibrillation: Secondary | ICD-10-CM | POA: Diagnosis not present

## 2022-06-26 DIAGNOSIS — G629 Polyneuropathy, unspecified: Secondary | ICD-10-CM | POA: Diagnosis not present

## 2022-07-14 ENCOUNTER — Other Ambulatory Visit: Payer: Self-pay

## 2022-07-14 ENCOUNTER — Emergency Department (HOSPITAL_COMMUNITY): Payer: Medicare Other | Admitting: Anesthesiology

## 2022-07-14 ENCOUNTER — Inpatient Hospital Stay (HOSPITAL_COMMUNITY)
Admission: EM | Admit: 2022-07-14 | Discharge: 2022-08-03 | DRG: 480 | Disposition: E | Payer: Medicare Other | Attending: Pulmonary Disease | Admitting: Pulmonary Disease

## 2022-07-14 ENCOUNTER — Encounter (HOSPITAL_COMMUNITY): Payer: Self-pay | Admitting: Emergency Medicine

## 2022-07-14 ENCOUNTER — Emergency Department (HOSPITAL_COMMUNITY): Payer: Medicare Other

## 2022-07-14 ENCOUNTER — Inpatient Hospital Stay (HOSPITAL_COMMUNITY): Payer: Medicare Other

## 2022-07-14 DIAGNOSIS — K5730 Diverticulosis of large intestine without perforation or abscess without bleeding: Secondary | ICD-10-CM | POA: Diagnosis not present

## 2022-07-14 DIAGNOSIS — J320 Chronic maxillary sinusitis: Secondary | ICD-10-CM | POA: Diagnosis not present

## 2022-07-14 DIAGNOSIS — I2510 Atherosclerotic heart disease of native coronary artery without angina pectoris: Secondary | ICD-10-CM | POA: Diagnosis present

## 2022-07-14 DIAGNOSIS — S0232XA Fracture of orbital floor, left side, initial encounter for closed fracture: Secondary | ICD-10-CM | POA: Diagnosis present

## 2022-07-14 DIAGNOSIS — I4891 Unspecified atrial fibrillation: Secondary | ICD-10-CM | POA: Diagnosis not present

## 2022-07-14 DIAGNOSIS — Z7901 Long term (current) use of anticoagulants: Secondary | ICD-10-CM

## 2022-07-14 DIAGNOSIS — G928 Other toxic encephalopathy: Secondary | ICD-10-CM | POA: Diagnosis not present

## 2022-07-14 DIAGNOSIS — F32A Depression, unspecified: Secondary | ICD-10-CM | POA: Diagnosis present

## 2022-07-14 DIAGNOSIS — M160 Bilateral primary osteoarthritis of hip: Secondary | ICD-10-CM | POA: Diagnosis not present

## 2022-07-14 DIAGNOSIS — R911 Solitary pulmonary nodule: Secondary | ICD-10-CM | POA: Diagnosis not present

## 2022-07-14 DIAGNOSIS — I130 Hypertensive heart and chronic kidney disease with heart failure and stage 1 through stage 4 chronic kidney disease, or unspecified chronic kidney disease: Secondary | ICD-10-CM | POA: Diagnosis present

## 2022-07-14 DIAGNOSIS — Z515 Encounter for palliative care: Secondary | ICD-10-CM | POA: Diagnosis not present

## 2022-07-14 DIAGNOSIS — R578 Other shock: Secondary | ICD-10-CM | POA: Diagnosis not present

## 2022-07-14 DIAGNOSIS — I5023 Acute on chronic systolic (congestive) heart failure: Secondary | ICD-10-CM | POA: Diagnosis not present

## 2022-07-14 DIAGNOSIS — Z4789 Encounter for other orthopedic aftercare: Secondary | ICD-10-CM | POA: Diagnosis not present

## 2022-07-14 DIAGNOSIS — K920 Hematemesis: Secondary | ICD-10-CM | POA: Diagnosis not present

## 2022-07-14 DIAGNOSIS — Z043 Encounter for examination and observation following other accident: Secondary | ICD-10-CM | POA: Diagnosis not present

## 2022-07-14 DIAGNOSIS — N179 Acute kidney failure, unspecified: Secondary | ICD-10-CM | POA: Diagnosis not present

## 2022-07-14 DIAGNOSIS — Y92239 Unspecified place in hospital as the place of occurrence of the external cause: Secondary | ICD-10-CM | POA: Diagnosis not present

## 2022-07-14 DIAGNOSIS — I509 Heart failure, unspecified: Secondary | ICD-10-CM | POA: Diagnosis not present

## 2022-07-14 DIAGNOSIS — S72009A Fracture of unspecified part of neck of unspecified femur, initial encounter for closed fracture: Secondary | ICD-10-CM | POA: Diagnosis not present

## 2022-07-14 DIAGNOSIS — Z951 Presence of aortocoronary bypass graft: Secondary | ICD-10-CM

## 2022-07-14 DIAGNOSIS — G9349 Other encephalopathy: Secondary | ICD-10-CM | POA: Diagnosis not present

## 2022-07-14 DIAGNOSIS — D500 Iron deficiency anemia secondary to blood loss (chronic): Secondary | ICD-10-CM | POA: Diagnosis not present

## 2022-07-14 DIAGNOSIS — Z20822 Contact with and (suspected) exposure to covid-19: Secondary | ICD-10-CM | POA: Diagnosis present

## 2022-07-14 DIAGNOSIS — R54 Age-related physical debility: Secondary | ICD-10-CM | POA: Diagnosis not present

## 2022-07-14 DIAGNOSIS — S300XXA Contusion of lower back and pelvis, initial encounter: Secondary | ICD-10-CM | POA: Diagnosis present

## 2022-07-14 DIAGNOSIS — J9601 Acute respiratory failure with hypoxia: Secondary | ICD-10-CM

## 2022-07-14 DIAGNOSIS — Z888 Allergy status to other drugs, medicaments and biological substances status: Secondary | ICD-10-CM

## 2022-07-14 DIAGNOSIS — E872 Acidosis, unspecified: Secondary | ICD-10-CM | POA: Diagnosis not present

## 2022-07-14 DIAGNOSIS — I4819 Other persistent atrial fibrillation: Secondary | ICD-10-CM | POA: Diagnosis present

## 2022-07-14 DIAGNOSIS — K7290 Hepatic failure, unspecified without coma: Secondary | ICD-10-CM | POA: Diagnosis not present

## 2022-07-14 DIAGNOSIS — Z79890 Hormone replacement therapy (postmenopausal): Secondary | ICD-10-CM

## 2022-07-14 DIAGNOSIS — D631 Anemia in chronic kidney disease: Secondary | ICD-10-CM | POA: Diagnosis present

## 2022-07-14 DIAGNOSIS — I350 Nonrheumatic aortic (valve) stenosis: Secondary | ICD-10-CM

## 2022-07-14 DIAGNOSIS — Z90711 Acquired absence of uterus with remaining cervical stump: Secondary | ICD-10-CM

## 2022-07-14 DIAGNOSIS — S72112A Displaced fracture of greater trochanter of left femur, initial encounter for closed fracture: Secondary | ICD-10-CM | POA: Diagnosis not present

## 2022-07-14 DIAGNOSIS — I517 Cardiomegaly: Secondary | ICD-10-CM | POA: Diagnosis not present

## 2022-07-14 DIAGNOSIS — M80852A Other osteoporosis with current pathological fracture, left femur, initial encounter for fracture: Principal | ICD-10-CM | POA: Diagnosis present

## 2022-07-14 DIAGNOSIS — S79912A Unspecified injury of left hip, initial encounter: Secondary | ICD-10-CM | POA: Diagnosis not present

## 2022-07-14 DIAGNOSIS — M109 Gout, unspecified: Secondary | ICD-10-CM | POA: Diagnosis present

## 2022-07-14 DIAGNOSIS — E785 Hyperlipidemia, unspecified: Secondary | ICD-10-CM | POA: Diagnosis present

## 2022-07-14 DIAGNOSIS — E039 Hypothyroidism, unspecified: Secondary | ICD-10-CM | POA: Diagnosis present

## 2022-07-14 DIAGNOSIS — I080 Rheumatic disorders of both mitral and aortic valves: Secondary | ICD-10-CM | POA: Diagnosis present

## 2022-07-14 DIAGNOSIS — W19XXXA Unspecified fall, initial encounter: Secondary | ICD-10-CM | POA: Diagnosis not present

## 2022-07-14 DIAGNOSIS — R791 Abnormal coagulation profile: Secondary | ICD-10-CM | POA: Diagnosis not present

## 2022-07-14 DIAGNOSIS — R112 Nausea with vomiting, unspecified: Secondary | ICD-10-CM | POA: Diagnosis not present

## 2022-07-14 DIAGNOSIS — M1712 Unilateral primary osteoarthritis, left knee: Secondary | ICD-10-CM | POA: Diagnosis not present

## 2022-07-14 DIAGNOSIS — I700 Atherosclerosis of aorta: Secondary | ICD-10-CM | POA: Diagnosis present

## 2022-07-14 DIAGNOSIS — G8911 Acute pain due to trauma: Secondary | ICD-10-CM | POA: Diagnosis not present

## 2022-07-14 DIAGNOSIS — G629 Polyneuropathy, unspecified: Secondary | ICD-10-CM | POA: Diagnosis present

## 2022-07-14 DIAGNOSIS — Z79899 Other long term (current) drug therapy: Secondary | ICD-10-CM

## 2022-07-14 DIAGNOSIS — Z85828 Personal history of other malignant neoplasm of skin: Secondary | ICD-10-CM

## 2022-07-14 DIAGNOSIS — M85852 Other specified disorders of bone density and structure, left thigh: Secondary | ICD-10-CM

## 2022-07-14 DIAGNOSIS — Z7189 Other specified counseling: Secondary | ICD-10-CM

## 2022-07-14 DIAGNOSIS — M4319 Spondylolisthesis, multiple sites in spine: Secondary | ICD-10-CM | POA: Diagnosis not present

## 2022-07-14 DIAGNOSIS — R579 Shock, unspecified: Secondary | ICD-10-CM | POA: Diagnosis not present

## 2022-07-14 DIAGNOSIS — Z801 Family history of malignant neoplasm of trachea, bronchus and lung: Secondary | ICD-10-CM

## 2022-07-14 DIAGNOSIS — W06XXXA Fall from bed, initial encounter: Secondary | ICD-10-CM

## 2022-07-14 DIAGNOSIS — S72002A Fracture of unspecified part of neck of left femur, initial encounter for closed fracture: Secondary | ICD-10-CM | POA: Diagnosis not present

## 2022-07-14 DIAGNOSIS — Z8249 Family history of ischemic heart disease and other diseases of the circulatory system: Secondary | ICD-10-CM

## 2022-07-14 DIAGNOSIS — Y92009 Unspecified place in unspecified non-institutional (private) residence as the place of occurrence of the external cause: Secondary | ICD-10-CM

## 2022-07-14 DIAGNOSIS — M11262 Other chondrocalcinosis, left knee: Secondary | ICD-10-CM | POA: Diagnosis not present

## 2022-07-14 DIAGNOSIS — Z638 Other specified problems related to primary support group: Secondary | ICD-10-CM

## 2022-07-14 DIAGNOSIS — W010XXA Fall on same level from slipping, tripping and stumbling without subsequent striking against object, initial encounter: Secondary | ICD-10-CM | POA: Diagnosis present

## 2022-07-14 DIAGNOSIS — E875 Hyperkalemia: Secondary | ICD-10-CM | POA: Diagnosis not present

## 2022-07-14 DIAGNOSIS — R0602 Shortness of breath: Secondary | ICD-10-CM | POA: Diagnosis not present

## 2022-07-14 DIAGNOSIS — I469 Cardiac arrest, cause unspecified: Secondary | ICD-10-CM | POA: Diagnosis not present

## 2022-07-14 DIAGNOSIS — S199XXA Unspecified injury of neck, initial encounter: Secondary | ICD-10-CM | POA: Diagnosis not present

## 2022-07-14 DIAGNOSIS — R410 Disorientation, unspecified: Secondary | ICD-10-CM | POA: Diagnosis not present

## 2022-07-14 DIAGNOSIS — I10 Essential (primary) hypertension: Secondary | ICD-10-CM | POA: Diagnosis not present

## 2022-07-14 DIAGNOSIS — R000 Tachycardia, unspecified: Secondary | ICD-10-CM | POA: Diagnosis not present

## 2022-07-14 DIAGNOSIS — I5020 Unspecified systolic (congestive) heart failure: Secondary | ICD-10-CM

## 2022-07-14 DIAGNOSIS — F0390 Unspecified dementia without behavioral disturbance: Secondary | ICD-10-CM | POA: Diagnosis present

## 2022-07-14 DIAGNOSIS — R570 Cardiogenic shock: Secondary | ICD-10-CM

## 2022-07-14 DIAGNOSIS — D62 Acute posthemorrhagic anemia: Secondary | ICD-10-CM | POA: Diagnosis not present

## 2022-07-14 DIAGNOSIS — R34 Anuria and oliguria: Secondary | ICD-10-CM | POA: Diagnosis not present

## 2022-07-14 DIAGNOSIS — M4692 Unspecified inflammatory spondylopathy, cervical region: Secondary | ICD-10-CM | POA: Diagnosis not present

## 2022-07-14 DIAGNOSIS — T4275XA Adverse effect of unspecified antiepileptic and sedative-hypnotic drugs, initial encounter: Secondary | ICD-10-CM | POA: Diagnosis not present

## 2022-07-14 DIAGNOSIS — I50810 Right heart failure, unspecified: Secondary | ICD-10-CM | POA: Diagnosis present

## 2022-07-14 DIAGNOSIS — I110 Hypertensive heart disease with heart failure: Secondary | ICD-10-CM | POA: Diagnosis not present

## 2022-07-14 DIAGNOSIS — K7689 Other specified diseases of liver: Secondary | ICD-10-CM | POA: Diagnosis not present

## 2022-07-14 DIAGNOSIS — S72142A Displaced intertrochanteric fracture of left femur, initial encounter for closed fracture: Secondary | ICD-10-CM

## 2022-07-14 DIAGNOSIS — M25552 Pain in left hip: Secondary | ICD-10-CM | POA: Diagnosis not present

## 2022-07-14 DIAGNOSIS — I5082 Biventricular heart failure: Secondary | ICD-10-CM | POA: Diagnosis present

## 2022-07-14 DIAGNOSIS — I6529 Occlusion and stenosis of unspecified carotid artery: Secondary | ICD-10-CM | POA: Diagnosis not present

## 2022-07-14 DIAGNOSIS — I259 Chronic ischemic heart disease, unspecified: Secondary | ICD-10-CM | POA: Diagnosis present

## 2022-07-14 DIAGNOSIS — Z7984 Long term (current) use of oral hypoglycemic drugs: Secondary | ICD-10-CM

## 2022-07-14 DIAGNOSIS — Z66 Do not resuscitate: Secondary | ICD-10-CM | POA: Diagnosis not present

## 2022-07-14 DIAGNOSIS — I255 Ischemic cardiomyopathy: Secondary | ICD-10-CM | POA: Diagnosis present

## 2022-07-14 DIAGNOSIS — M47816 Spondylosis without myelopathy or radiculopathy, lumbar region: Secondary | ICD-10-CM | POA: Diagnosis not present

## 2022-07-14 DIAGNOSIS — N1831 Chronic kidney disease, stage 3a: Secondary | ICD-10-CM | POA: Diagnosis present

## 2022-07-14 LAB — I-STAT CHEM 8, ED
BUN: 22 mg/dL (ref 8–23)
Calcium, Ion: 1.13 mmol/L — ABNORMAL LOW (ref 1.15–1.40)
Chloride: 101 mmol/L (ref 98–111)
Creatinine, Ser: 0.8 mg/dL (ref 0.44–1.00)
Glucose, Bld: 211 mg/dL — ABNORMAL HIGH (ref 70–99)
HCT: 39 % (ref 36.0–46.0)
Hemoglobin: 13.3 g/dL (ref 12.0–15.0)
Potassium: 3.7 mmol/L (ref 3.5–5.1)
Sodium: 136 mmol/L (ref 135–145)
TCO2: 24 mmol/L (ref 22–32)

## 2022-07-14 LAB — COMPREHENSIVE METABOLIC PANEL
ALT: 34 U/L (ref 0–44)
AST: 26 U/L (ref 15–41)
Albumin: 3.4 g/dL — ABNORMAL LOW (ref 3.5–5.0)
Alkaline Phosphatase: 152 U/L — ABNORMAL HIGH (ref 38–126)
Anion gap: 14 (ref 5–15)
BUN: 22 mg/dL (ref 8–23)
CO2: 23 mmol/L (ref 22–32)
Calcium: 9.6 mg/dL (ref 8.9–10.3)
Chloride: 99 mmol/L (ref 98–111)
Creatinine, Ser: 0.98 mg/dL (ref 0.44–1.00)
GFR, Estimated: 56 mL/min — ABNORMAL LOW (ref >60)
Glucose, Bld: 210 mg/dL — ABNORMAL HIGH (ref 70–99)
Potassium: 3.8 mmol/L (ref 3.5–5.1)
Sodium: 136 mmol/L (ref 135–145)
Total Bilirubin: 1.4 mg/dL — ABNORMAL HIGH (ref 0.3–1.2)
Total Protein: 6.7 g/dL (ref 6.5–8.1)

## 2022-07-14 LAB — CBC
HCT: 37.9 % (ref 36.0–46.0)
Hemoglobin: 12.9 g/dL (ref 12.0–15.0)
MCH: 35.1 pg — ABNORMAL HIGH (ref 26.0–34.0)
MCHC: 34 g/dL (ref 30.0–36.0)
MCV: 103 fL — ABNORMAL HIGH (ref 80.0–100.0)
Platelets: 234 10*3/uL (ref 150–400)
RBC: 3.68 MIL/uL — ABNORMAL LOW (ref 3.87–5.11)
RDW: 15.7 % — ABNORMAL HIGH (ref 11.5–15.5)
WBC: 9.3 10*3/uL (ref 4.0–10.5)
nRBC: 0 % (ref 0.0–0.2)

## 2022-07-14 LAB — PROTIME-INR
INR: 2.1 — ABNORMAL HIGH (ref 0.8–1.2)
Prothrombin Time: 23.6 seconds — ABNORMAL HIGH (ref 11.4–15.2)

## 2022-07-14 LAB — RESP PANEL BY RT-PCR (FLU A&B, COVID) ARPGX2
Influenza A by PCR: NEGATIVE
Influenza B by PCR: NEGATIVE
SARS Coronavirus 2 by RT PCR: NEGATIVE

## 2022-07-14 LAB — SURGICAL PCR SCREEN
MRSA, PCR: NEGATIVE
Staphylococcus aureus: NEGATIVE

## 2022-07-14 LAB — LACTIC ACID, PLASMA: Lactic Acid, Venous: 1.9 mmol/L (ref 0.5–1.9)

## 2022-07-14 LAB — MAGNESIUM: Magnesium: 1.9 mg/dL (ref 1.7–2.4)

## 2022-07-14 LAB — AMMONIA: Ammonia: 14 umol/L (ref 9–35)

## 2022-07-14 LAB — ETHANOL: Alcohol, Ethyl (B): <10 mg/dL (ref <10)

## 2022-07-14 MED ORDER — FENTANYL CITRATE PF 50 MCG/ML IJ SOSY: 25.0000 ug | PREFILLED_SYRINGE | Freq: Once | INTRAMUSCULAR | Status: DC

## 2022-07-14 MED ORDER — LEVOTHYROXINE SODIUM 25 MCG PO TABS
25.0000 ug | ORAL_TABLET | Freq: Every day | ORAL | Status: DC
  Administered 2022-07-16: 25 ug via ORAL
  Filled 2022-07-14: qty 1

## 2022-07-14 MED ORDER — ROPIVACAINE HCL 5 MG/ML IJ SOLN
INTRAMUSCULAR | Status: DC | PRN
  Administered 2022-07-14: 20 mL via PERINEURAL

## 2022-07-14 MED ORDER — OXYCODONE HCL 5 MG PO TABS
5.0000 mg | ORAL_TABLET | Freq: Four times a day (QID) | ORAL | Status: DC | PRN
  Administered 2022-07-14: 5 mg via ORAL
  Filled 2022-07-14: qty 1

## 2022-07-14 MED ORDER — DULOXETINE HCL 30 MG PO CPEP
30.0000 mg | ORAL_CAPSULE | Freq: Every day | ORAL | Status: DC
  Administered 2022-07-14 – 2022-07-15 (×2): 30 mg via ORAL
  Filled 2022-07-14 (×3): qty 1

## 2022-07-14 MED ORDER — POTASSIUM CHLORIDE CRYS ER 20 MEQ PO TBCR
40.0000 meq | EXTENDED_RELEASE_TABLET | Freq: Two times a day (BID) | ORAL | Status: AC
  Administered 2022-07-14: 40 meq via ORAL
  Filled 2022-07-14: qty 2

## 2022-07-14 MED ORDER — METOPROLOL SUCCINATE ER 25 MG PO TB24
50.0000 mg | ORAL_TABLET | Freq: Every day | ORAL | Status: DC
  Administered 2022-07-14 – 2022-07-15 (×2): 50 mg via ORAL
  Filled 2022-07-14 (×3): qty 2

## 2022-07-14 MED ORDER — ACETAMINOPHEN 325 MG PO TABS: 650.0000 mg | ORAL_TABLET | Freq: Four times a day (QID) | ORAL | Status: DC | PRN

## 2022-07-14 MED ORDER — ACETAMINOPHEN 500 MG PO TABS
1000.0000 mg | ORAL_TABLET | Freq: Three times a day (TID) | ORAL | Status: DC
  Administered 2022-07-14 – 2022-07-16 (×4): 1000 mg via ORAL
  Filled 2022-07-14 (×4): qty 2

## 2022-07-14 MED ORDER — NITROGLYCERIN 0.4 MG SL SUBL: 0.4000 mg | SUBLINGUAL_TABLET | SUBLINGUAL | Status: DC | PRN

## 2022-07-14 MED ORDER — KETOROLAC TROMETHAMINE 15 MG/ML IJ SOLN
15.0000 mg | Freq: Four times a day (QID) | INTRAMUSCULAR | Status: DC | PRN
  Administered 2022-07-15: 15 mg via INTRAVENOUS
  Filled 2022-07-14: qty 1

## 2022-07-14 MED ORDER — ALLOPURINOL 300 MG PO TABS
300.0000 mg | ORAL_TABLET | Freq: Every day | ORAL | Status: DC
  Administered 2022-07-15 – 2022-07-16 (×2): 300 mg via ORAL
  Filled 2022-07-14 (×2): qty 1

## 2022-07-14 MED ORDER — IOHEXOL 300 MG/ML  SOLN
100.0000 mL | Freq: Once | INTRAMUSCULAR | Status: AC | PRN
  Administered 2022-07-14: 100 mL via INTRAVENOUS

## 2022-07-14 NOTE — ED Triage Notes (Signed)
Pt arrives via EMS from home with a mechanical fall and landed on her bottom. Pt was given 25 mcg fentanyl in route. EDP at bedside and downgraded due to not hitting her head.

## 2022-07-14 NOTE — ED Notes (Signed)
ED TO INPATIENT HANDOFF REPORT  ED Nurse Name and Phone #:   S Name/Age/Gender Natasha Livingston 88 y.o. female Room/Bed: 029C/029C  Code Status   Code Status: Full Code  Home/SNF/Other Home Patient oriented to: self, place, time, and situation Is this baseline? Yes   Triage Complete: Triage complete  Chief Complaint Hip fracture [facility]) [S72.009A]  Triage Note Pt arrives via EMS from home with a mechanical fall and landed on her bottom. Pt was given 25 mcg fentanyl in route. EDP at bedside and downgraded due to not hitting her head.    Allergies Allergies  Allergen Reactions   Crestor [Rosuvastatin Calcium] Other (See Comments)    Unknown reaction    Lipitor [Atorvastatin Calcium] Swelling   Pacerone [Amiodarone] Other (See Comments)    Unknown reaction     Level of Care/Admitting Diagnosis ED Disposition     ED Disposition  Admit   Condition  --   [place]: [facility] [[id number]]  Level of Care: Med-Surg [16]  May admit patient to [facility] or [facility] if equivalent level of care is available:: Yes  Covid Evaluation: Asymptomatic - no recent exposure (last 10 days) testing not required  Diagnosis: Hip fracture [facility]) [[id number]]  Admitting Physician: Axel Filler [phone]  Attending Physician: Axel Filler [[id number]]  Certification:: I certify this patient will need inpatient services for at least 2 midnights  Estimated Length of Stay: 3          B Medical/Surgery History Past Medical History:  Diagnosis Date   Atrial fibrillation ([place])    Back pain    Bruises easily    Chest discomfort    Diverticulitis    Dizziness    DOE (dyspnea on exertion)    Epistaxis 06/16/2019   Forgetfulness    Hx of cardiovascular stress test    Lexiscan Myoview (12/15):  Apical lateral and anterolateral fixed defect, no ischemia, EF 54%; low risk   Hyperlipidemia    Hypertension    Hypothyroidism    Interstitial  nephritis    Ischemic heart disease    MVP (mitral valve prolapse)    Neuropathy    Past Surgical History:  Procedure Laterality Date   CARDIOVERSION  08/2008   CORONARY ARTERY BYPASS GRAFT  04/2007   HAND SURGERY Right    OVARIAN CYST REMOVAL     PARTIAL HYSTERECTOMY     1970s   skin cancer removal       A IV Location/Drains/Wounds Patient Lines/Drains/Airways Status     Active Line/Drains/Airways     None            Intake/Output Last 24 hours No intake or output data in the 24 hours ending 07/29/2022 1859  Labs/Imaging Results for orders placed or performed during the hospital encounter of 07/11/2022 (from the past 48 hour(s))  Resp Panel by RT-PCR (Flu A&B, Covid) Anterior Nasal Swab     Status: None   Collection Time: 07/29/2022 10:41 AM   Specimen: Anterior Nasal Swab  Result Value Ref Range   SARS Coronavirus 2 by RT PCR NEGATIVE NEGATIVE    Comment: (NOTE) SARS-CoV-2 target nucleic acids are NOT DETECTED.  The SARS-CoV-2 RNA is generally detectable in upper respiratory specimens during the acute phase of infection. The lowest concentration of SARS-CoV-2 viral copies this assay can detect is 138 copies/mL. A negative result does not preclude SARS-Cov-2 infection and should not be used as the sole basis for treatment  or other patient management decisions. A negative result may occur with  improper specimen collection/handling, submission of specimen other than nasopharyngeal swab, presence of viral mutation(s) within the areas targeted by this assay, and inadequate number of viral copies(<138 copies/mL). A negative result must be combined with clinical observations, patient history, and epidemiological information. The expected result is Negative.  Fact Sheet for Patients:  EntrepreneurPulse.com.au  Fact Sheet for Healthcare Providers:  IncredibleEmployment.be  This test is no t yet approved or cleared by the [place] FDA and  has been authorized for detection and/or diagnosis of SARS-CoV-2 by FDA under an Emergency Use Authorization (EUA). This EUA will remain  in effect (meaning this test can be used) for the duration of the COVID-19 declaration under Section 564(b)(1) of the Act, 21 U.S.C.section 360bbb-3(b)(1), unless the authorization is terminated  or revoked sooner.       Influenza A by PCR NEGATIVE NEGATIVE   Influenza B by PCR NEGATIVE NEGATIVE    Comment: (NOTE) The Xpert Xpress SARS-CoV-2/FLU/RSV plus assay is intended as an aid in the diagnosis of influenza from Nasopharyngeal swab specimens and should not be used as a sole basis for treatment. Nasal washings and aspirates are unacceptable for Xpert Xpress SARS-CoV-2/FLU/RSV testing.  Fact Sheet for Patients: EntrepreneurPulse.com.au  Fact Sheet for Healthcare Providers: IncredibleEmployment.be  This test is not yet approved or cleared by the [place] FDA and has been authorized for detection and/or diagnosis of SARS-CoV-2 by FDA under an Emergency Use Authorization (EUA). This EUA will remain in effect (meaning this test can be used) for the duration of the COVID-19 declaration under Section 564(b)(1) of the Act, 21 U.S.C. section 360bbb-3(b)(1), unless the authorization is terminated or revoked.  Performed at [facility], [place] [address] [place], [place] [address]   Comprehensive metabolic panel     Status: Abnormal   Collection Time: 07/20/2022 10:41 AM  Result Value Ref Range   Sodium 136 135 - 145 mmol/L   Potassium 3.8 3.5 - 5.1 mmol/L   Chloride 99 98 - 111 mmol/L   CO2 23 22 - 32 mmol/L   Glucose, Bld 210 (H) 70 - 99 mg/dL    Comment: Glucose reference range applies only to samples taken after fasting for at least 8 hours.   BUN 22 8 - 23 mg/dL   Creatinine, Ser 0.98 0.44 - 1.00 mg/dL   Calcium 9.6 8.9 - 10.3 mg/dL   Total Protein 6.7 6.5 - 8.1 g/dL   Albumin  3.4 (L) 3.5 - 5.0 g/dL   AST 26 15 - 41 U/L   ALT 34 0 - 44 U/L   Alkaline Phosphatase 152 (H) 38 - 126 U/L   Total Bilirubin 1.4 (H) 0.3 - 1.2 mg/dL   GFR, Estimated 56 (L) >60 mL/min    Comment: (NOTE) Calculated using the CKD-EPI Creatinine Equation (2021)    Anion gap 14 5 - 15    Comment: Performed at [facility] [address] [place] [address]  CBC     Status: Abnormal   Collection Time: 07/10/2022 10:41 AM  Result Value Ref Range   WBC 9.3 4.0 - 10.5 K/uL   RBC 3.68 (L) 3.87 - 5.11 MIL/uL   Hemoglobin 12.9 12.0 - 15.0 g/dL   HCT 37.9 36.0 - 46.0 %   MCV 103.0 (H) 80.0 - 100.0 fL   MCH 35.1 (H) 26.0 - 34.0 pg   MCHC 34.0 30.0 - 36.0 g/dL  RDW 15.7 (H) 11.5 - 15.5 %   Platelets 234 150 - 400 K/uL   nRBC 0.0 0.0 - 0.2 %    Comment: Performed at [facility], [place] [address], [place], [place] [address]  Lactic acid, plasma     Status: None   Collection Time: 07/11/2022 10:41 AM  Result Value Ref Range   Lactic Acid, Venous 1.9 0.5 - 1.9 mmol/L    Comment: Performed at [facility] [address] [place], [place] [address]  Protime-INR     Status: Abnormal   Collection Time: 07/19/2022 10:41 AM  Result Value Ref Range   Prothrombin Time 23.6 (H) 11.4 - 15.2 seconds   INR 2.1 (H) 0.8 - 1.2    Comment: (NOTE) INR goal varies based on device and disease states. Performed at [facility], [place] [address], [place], [place] [address]   Ethanol     Status: None   Collection Time: 07/18/2022 11:00 AM  Result Value Ref Range   Alcohol, Ethyl (B) <10 <10 mg/dL    Comment: (NOTE) Lowest detectable limit for serum alcohol is 10 mg/dL.  For medical purposes only. Performed at [facility], [place] [address] [place],  [address]   I-Stat Chem 8, ED     Status: Abnormal   Collection Time: 07/19/2022 11:02 AM  Result Value Ref Range   Sodium 136 135 - 145 mmol/L   Potassium 3.7 3.5 - 5.1 mmol/L   Chloride 101 98 - 111 mmol/L   BUN 22 8  - 23 mg/dL   Creatinine, Ser 0.80 0.44 - 1.00 mg/dL   Glucose, Bld 211 (H) 70 - 99 mg/dL    Comment: Glucose reference range applies only to samples taken after fasting for at least 8 hours.   Calcium, Ion 1.13 (L) 1.15 - 1.40 mmol/L   TCO2 24 22 - 32 mmol/L   Hemoglobin 13.3 12.0 - 15.0 g/dL   HCT 39.0 36.0 - 46.0 %   DG Knee Complete 4 Views Left  Result Date: 07/09/2022 CLINICAL DATA:  Fall with lateral knee pain and LEFT hip pain. EXAM: LEFT KNEE - COMPLETE 4+ VIEW COMPARISON:  Femoral evaluation of the same date. FINDINGS: Mild tricompartmental degenerative changes about the knee. No sign of joint effusion. Moderate chondrocalcinosis. No acute fracture or dislocation about the LEFT the. Soft tissues are unremarkable. IMPRESSION: 1. No acute fracture or dislocation. Mild tricompartmental degenerative changes. 2. Moderate chondrocalcinosis. Electronically Signed   By: Zetta Bills M.D.   On: 07/27/2022 14:58   CT CHEST ABDOMEN PELVIS W CONTRAST  Result Date: 07/09/2022 CLINICAL DATA:  An 88 year old female presents following mechanical fall for assessment of trauma. EXAM: CT CHEST, ABDOMEN, AND PELVIS WITH CONTRAST TECHNIQUE: Multidetector CT imaging of the chest, abdomen and pelvis was performed following the standard protocol during bolus administration of intravenous contrast. RADIATION DOSE REDUCTION: This exam was performed according to the departmental dose-optimization program which includes automated exposure control, adjustment of the mA and/or kV according to patient size and/or use of iterative reconstruction technique. CONTRAST:  187m OMNIPAQUE IOHEXOL 300 MG/ML  SOLN COMPARISON:  April of 2022 CT of the chest. FINDINGS: CT CHEST FINDINGS Cardiovascular: Marked cardiomegaly with signs of median sternotomy for CABG. LEFT atrial and RIGHT heart enlargement. No pericardial effusion. Central pulmonary vasculature is of normal caliber and is unremarkable on venous phase. Aortic  atherosclerosis both calcified and noncalcified of the thoracic aorta. No signs of dilation or aortic  injury. Three-vessel branching pattern in the chest. Mediastinum/Nodes: No hematoma in the mediastinum. No adenopathy in the chest. Lungs/Pleura: Basilar atelectasis, stable mild subpleural reticulation at the lung bases. Stable benign pulmonary nodule in the RIGHT lung base (image 101/5) 3 mm. Airways are patent. No pneumothorax. No pleural effusion. Musculoskeletal: See below for full musculoskeletal details. CT ABDOMEN PELVIS FINDINGS Hepatobiliary: Reflux of contrast into the hepatic veins. Lobular hepatic contours. No focal, suspicious hepatic lesion or signs of hepatic trauma. Post cholecystectomy without biliary duct distension. Portal vein is patent. Pancreas: Pancreatic atrophy without signs of inflammation. No ductal dilation. No signs of trauma to the pancreas. Spleen: Smooth contours without signs of trauma or focal, suspicious lesion. Normal size. Adrenals/Urinary Tract: Adrenal glands are normal. Symmetric renal enhancement without signs of hydronephrosis or perinephric stranding. Signs of renal trauma. No signs of renal trauma. Urinary bladder with smooth contours. No suspicious renal lesion or Stomach/Bowel: Colonic diverticulosis in sigmoid diverticular disease. No acute gastrointestinal process. Appendix not visible but no secondary signs to suggest acute appendiceal process. Vascular/Lymphatic: Smooth aortic contour with normal caliber and extensive calcified and noncalcified aortic atherosclerotic plaque. No signs of adenopathy in the abdomen or in the pelvis. Reproductive: Post hysterectomy. Other: No sign of free air.  No evidence of pelvic ascites. Musculoskeletal: Visualized clavicles and scapulae are intact. No displaced rib fractures. Sternum with median sternotomy changes. Costochondral elements are intact. No body wall contusion aside from contusion overlying the LEFT gluteal region and  hip. Spinal degenerative changes without signs of spinal malalignment. Comminuted fracture of the intertrochanteric LEFT proximal femur. Femoral head is located within the acetabulum. Intramuscular hematoma of gluteal musculature on the LEFT is small. Bilateral SI joints are symmetric.  Symphysis pubis is intact. IMPRESSION: 1. Comminuted fracture of the intertrochanteric LEFT proximal femur. Associated with marked comminution and Verus angulation. 2. Intramuscular hematoma of the LEFT gluteal musculature is small. 3. Marked cardiomegaly with signs of median sternotomy for CABG. 4. Lobular hepatic contours, correlate with any clinical or laboratory evidence of liver disease. Patient is at risk for cardiogenic liver disease based on the appearance of the heart and signs of RIGHT heart dysfunction described above with reflux of contrast into hepatic veins. 5. Colonic diverticulosis without evidence of acute gastrointestinal process. 6. Aortic atherosclerosis. Aortic Atherosclerosis (ICD10-I70.0). Electronically Signed   By: Zetta Bills M.D.   On: 08/01/2022 13:22   CT HEAD WO CONTRAST  Result Date: 07/16/2022 CLINICAL DATA:  Mechanical fall EXAM: CT HEAD WITHOUT CONTRAST TECHNIQUE: Contiguous axial images were obtained from the base of the skull through the vertex without intravenous contrast. RADIATION DOSE REDUCTION: This exam was performed according to the departmental dose-optimization program which includes automated exposure control, adjustment of the mA and/or kV according to patient size and/or use of iterative reconstruction technique. COMPARISON:  None Available. FINDINGS: Brain: No evidence of acute infarction, hemorrhage, hydrocephalus, extra-axial collection or mass lesion/mass effect. Vascular: No hyperdense vessel or unexpected calcification. Skull: No acute fracture or focal lesion. Of note, there is motion artifact at the skull base, which slightly limits evaluation. Sinuses/Orbits: Partial  opacification of the left maxillary sinus. Other: None. IMPRESSION: 1. No definite acute intracranial abnormality. 2. Partial opacification of the left maxillary sinus, which may be related to chronic sinusitis. However, a subtle nondisplaced sinus fracture and resulting blood products is not excluded. If there is persistent clinical concern, consider further assessment with CT facial bone. Electronically Signed   By: Beryle Flock M.D.   On: 07/13/2022  13:09   CT CERVICAL SPINE WO CONTRAST  Result Date: 07/16/2022 CLINICAL DATA:  Polytrauma, blunt. EXAM: CT CERVICAL SPINE WITHOUT CONTRAST TECHNIQUE: Multidetector CT imaging of the cervical spine was performed without intravenous contrast. Multiplanar CT image reconstructions were also generated. RADIATION DOSE REDUCTION: This exam was performed according to the departmental dose-optimization program which includes automated exposure control, adjustment of the mA and/or kV according to patient size and/or use of iterative reconstruction technique. COMPARISON:  None Available. FINDINGS: Alignment: Straightening/mild reversal of the normal cervical lordosis. Mild right convex curvature of the cervical spine. Minimal anterolisthesis of C3 on C4, C4 on C5, and C7 on T1, likely degenerative. Skull base and vertebrae: No acute fracture or suspicious osseous lesion. Moderate C1-2 arthropathy with noncompressive ligamentous thickening and calcification posterior to the dens. Soft tissues and spinal canal: No prevertebral fluid or swelling. No visible canal hematoma. Disc levels: Advanced disc degeneration at C5-6 and C6-7. Widespread cervical and upper thoracic facet arthrosis. Multilevel neural foraminal stenosis due to uncovertebral and facet spurring, moderate on the right at C6-7. No evidence of high-grade spinal canal stenosis. Upper chest: More fully evaluated on separate chest CT. Other: Carotid atherosclerosis. IMPRESSION: 1. No acute cervical spine fracture.  2. Advanced multilevel disc and facet degeneration. Electronically Signed   By: Logan Bores M.D.   On: 08/01/2022 13:08   DG Pelvis Portable  Result Date: 07/12/2022 CLINICAL DATA:  Mechanical fall from standing EXAM: PORTABLE PELVIS 1 VIEW COMPARISON:  None Available. FINDINGS: Mild diffuse osteopenia. There is an apparent cortical discontinuity at the superior aspect of the left lesser trochanter, which may represent a nondisplaced fracture. However, there is limited assessment on single AP view and this may represent superimposed osseous structures. There is no evidence of pelvic diastasis. No pelvic bone lesions are seen. Moderate multilevel degenerative changes of the visualized lower lumbar spine. Moderate bilateral hip osteoarthritis. IMPRESSION: Questioned nondisplaced fracture of the left lesser trochanter. However, there is limited assessment on the provided single AP view. If there is persistent clinical concern, recommend multi-view left hip radiograph for further assessment. Electronically Signed   By: Beryle Flock M.D.   On: 07/08/2022 11:21   DG FEMUR PORT 1V LEFT  Result Date: 07/18/2022 CLINICAL DATA:  Fall EXAM: LEFT FEMUR PORTABLE 1 VIEW COMPARISON:  None Available. FINDINGS: No radiographic abnormality of the distal femur seen in single frontal view only. IMPRESSION: No radiographic abnormality of the distal femur seen in single frontal view only. Electronically Signed   By: Delanna Ahmadi M.D.   On: 07/04/2022 11:15   DG Chest Port 1 View  Result Date: 07/10/2022 CLINICAL DATA:  w 88 year old female post fall. EXAM: PORTABLE CHEST 1 VIEW COMPARISON:  Mar 18, 2021 FINDINGS: Post median sternotomy for CABG. EKG leads project over the chest. Cardiomediastinal contours and hilar structures are stable following median sternotomy with signs of cardiac enlargement as before. Lungs are clear.  No pneumothorax.  No sign of pleural effusion. On limited assessment no acute skeletal process  IMPRESSION: 1. Post median sternotomy with signs of cardiac enlargement. 2. No acute cardiopulmonary findings. Electronically Signed   By: Zetta Bills M.D.   On: 08/01/2022 11:15    Pending Labs Unresulted Labs (From admission, onward)     Start     Ordered   07/05/2022 0500  Protime-INR  Tomorrow morning,   R        07/24/2022 1401   07/10/2022 8315  Basic metabolic panel  Tomorrow morning,  R        07/06/2022 1726   07/30/2022 0500  CBC  Tomorrow morning,   R        08/02/2022 1726   07/12/2022 0500  APTT  Tomorrow morning,   R        07/09/2022 1726   07/29/2022 1755  Magnesium  Add-on,   AD        07/09/2022 1754   07/21/2022 1327  Ammonia  Once,   STAT        07/24/2022 1326   07/12/2022 1041  Urinalysis, Routine w reflex microscopic  (Trauma Panel)  Once,   URGENT        07/22/2022 1041            Vitals/Pain Today's Vitals   07/24/2022 1040 07/30/2022 1456 07/09/2022 1830 07/06/2022 1846  BP: (!) 124/93 (!) 124/99 113/80   Pulse: 81 91 76   Resp: 20 (!) 22 18   Temp: 97.7 F (36.5 C) 97.7 F (36.5 C)  (!) 97.5 F (36.4 C)  TempSrc: Oral   Oral  SpO2: 96% 94% 93% 95%  Weight:      Height:      PainSc:  0-No pain      Isolation Precautions No active isolations  Medications Medications  fentaNYL (SUBLIMAZE) injection 25 mcg (has no administration in time range)  levothyroxine (SYNTHROID) tablet 25 mcg (has no administration in time range)  metoprolol succinate (TOPROL-XL) 24 hr tablet 50 mg (has no administration in time range)  allopurinol (ZYLOPRIM) tablet 300 mg (has no administration in time range)  DULoxetine (CYMBALTA) DR capsule 30 mg (has no administration in time range)  nitroGLYCERIN (NITROSTAT) SL tablet 0.4 mg (has no administration in time range)  potassium chloride SA (KLOR-CON M) CR tablet 40 mEq (has no administration in time range)  acetaminophen (TYLENOL) tablet 650 mg (has no administration in time range)  iohexol (OMNIPAQUE) 300 MG/ML solution 100 mL (100 mLs  Intravenous Contrast Given 07/05/2022 1249)    Mobility non-ambulatory High fall risk   Focused Assessments    R Recommendations: See Admitting Provider Note  Report given to:   Additional Notes:

## 2022-07-14 NOTE — H&P (Cosign Needed)
Date: 07/25/2022               Patient Name:  Natasha Livingston MRN: [id number]  DOB: [birth year] Age / Sex: 88 y.o., female   PCP: Cyndi Bender, PA-C         Medical Service: Internal Medicine Teaching Service         Attending Physician: Dr. Evette Doffing, Mallie Mussel, *    First Contact: Delene Ruffini, MD Pager: Lysbeth Penner [phone]  Second Contact: Jeanie Cooks, MD Pager: Governor Rooks [phone]       After Hours (After 5p/  First Contact Pager: [phone]  weekends / holidays): Second Contact Pager: [phone]   SUBJECTIVE   Chief Complaint: hip pain  History of Present Illness: Patient is an 88 year old female with a hx of valvular afib on warfarin, mitral valve prolapse, ischemic heart disease s/p CABG, HTN, who presented with complaints of hip pain. Patient reports she was attempting to get out of bed this morning, forgot her cane, and lost her balance when she turned around to grab it. She reports that she also struck her left side and face on bed-frame when she fell. Complains of both hip pain and facial pain. She has not been able to move leg due to pain. Did have some bleeding from left nostril which has since stopped. She denies any LOC. She did not take her warfarin today as she typically takes it at night.  Additional history provided by son.  Son reports patient is conversational, is usually able to recall recent events, but typically oriented only to person and often will forget her medicines. He states she has help that assists her approx 12hr/day. They aid with IADLs. Patient is able to perform ADLs for herself. She takes Warfarin '5mg'$  MWF and 1/2 dose TRSaSu. No history of DVT, PE, or stroke.    Meds:  Current Meds  Medication Sig   acetaminophen (TYLENOL) 650 MG CR tablet Take 650 mg by mouth every 8 (eight) hours as needed for pain.   allopurinol (ZYLOPRIM) 300 MG tablet Take 300 mg by mouth daily.    DULoxetine (CYMBALTA) 60 MG capsule Take 30 mg by mouth at bedtime.   furosemide (LASIX)  40 MG tablet TAKE 1 TABLET BY MOUTH TWICE A DAY (Patient taking differently: Take 40 mg by mouth 2 (two) times daily.)   KLOR-CON M20 20 MEQ tablet TAKE 2 TABLETS BY MOUTH DAILY (Patient taking differently: Take 20 mEq by mouth 2 (two) times daily.)   levothyroxine (SYNTHROID) 25 MCG tablet Take 25 mcg by mouth daily before breakfast.   Menthol, Topical Analgesic, (BIOFREEZE) 5 % PTCH Apply 1 patch topically as needed (For back pain).   metoprolol succinate (TOPROL-XL) 50 MG 24 hr tablet TAKE 1 TABLET BY MOUTH TWICE A DAY WITH OR IMMEDIATELY FOLLOWING A MEAL (Patient taking differently: Take 50 mg by mouth in the morning and at bedtime. Immediately following or with a meal.)   sacubitril-valsartan (ENTRESTO) 24-26 MG Take 1 tablet by mouth 2 (two) times daily.   warfarin (COUMADIN) 5 MG tablet TAKE 1/2 TO 1 TABLET ONCE DAILY AS DIRECTED BY COUMADIN CLINIC (Patient taking differently: Take 2.5-5 mg by mouth See admin instructions. Take 1 tablet (5 mg) by mouth Monday, Wednesday and Friday then take 1/2 tablet (2.5 mg) on Tuesday, Thursday, Saturday and Sundays)    Past Medical History:  Diagnosis Date   Atrial fibrillation ([place])    Back pain    Bruises easily  Chest discomfort    Diverticulitis    Dizziness    DOE (dyspnea on exertion)    Epistaxis 06/16/2019   Forgetfulness    Hx of cardiovascular stress test    Lexiscan Myoview (12/15):  Apical lateral and anterolateral fixed defect, no ischemia, EF 54%; low risk   Hyperlipidemia    Hypertension    Hypothyroidism    Interstitial nephritis    Ischemic heart disease    MVP (mitral valve prolapse)    Neuropathy     Past Surgical History:  Procedure Laterality Date   CARDIOVERSION  08/2008   CORONARY ARTERY BYPASS GRAFT  04/2007   HAND SURGERY Right    OVARIAN CYST REMOVAL     PARTIAL HYSTERECTOMY     1970s   skin cancer removal      Social:  Lives With: alone Occupation: none Support: [place] aids Level of Function:  independent ADLs, requires assistance with IADLs PCP: Cyndi Bender, PA Substances: none  Family History:  Family History  Problem Relation Age of Onset   Lung cancer Mother    Cerebral aneurysm Father    Hypertension Father    Cerebral aneurysm Other    Healthy Son    Healthy Son    Prostate cancer Son    Heart attack Neg Hx    Stroke Neg Hx    Allergies: Allergies as of 07/21/2022 - Review Complete 07/10/2022  Allergen Reaction Noted   Crestor [rosuvastatin calcium] Other (See Comments) 03/25/2011   Lipitor [atorvastatin calcium] Swelling 03/25/2011   Pacerone [amiodarone] Other (See Comments) 03/25/2011    Review of Systems: A complete ROS was negative except as per HPI.   OBJECTIVE:   Physical Exam: Blood pressure (!) 124/99, pulse 91, temperature 97.7 F (36.5 C), resp. rate (!) 22, height '5\' 7"'$  (1.702 m), weight 61.2 kg, SpO2 94 %.  Constitutional: elderly-appearing female sitting in bed, in no acute distress HENT: left periorbital ecchymosis Eyes: conjunctiva non-erythematous Neck: supple Cardiovascular: regular rate and rhythm, no m/r/g Pulmonary/Chest: normal work of breathing on room air, lungs clear to auscultation bilaterally Abdominal: soft, non-tender, non-distended MSK: moves upper extremities. LLE shortened and externally rotated. Neurovascularly intact distally.  Neurological: alert, oriented to person, not oriented to place, time, or situation. Speech fluent. No facial droop. Sensation grossly intact. Moves upper extremities. Moves RLE, able ot move toes of LLE.  Skin: warm and dry Psych: pleasant, mood and affect appropriate  Labs: CBC    Component Value Date/Time   WBC 9.3 07/25/2022 1041   RBC 3.68 (L) 07/18/2022 1041   HGB 13.3 07/18/2022 1102   HGB 13.1 02/26/2022 1128   HCT 39.0 07/11/2022 1102   HCT 37.5 02/26/2022 1128   PLT 234 07/04/2022 1041   PLT 164 02/26/2022 1128   MCV 103.0 (H) 07/13/2022 1041   MCV 92 02/26/2022 1128   MCH  35.1 (H) 07/29/2022 1041   MCHC 34.0 07/19/2022 1041   RDW 15.7 (H) 07/22/2022 1041   RDW 15.1 02/26/2022 1128   LYMPHSABS 1.1 03/30/2022 1921   MONOABS 0.6 03/30/2022 1921   EOSABS 0.1 03/30/2022 1921   BASOSABS 0.0 03/30/2022 1921     CMP     Component Value Date/Time   NA 136 07/13/2022 1102   NA 143 02/26/2022 1128   K 3.7 07/30/2022 1102   CL 101 07/07/2022 1102   CO2 23 07/30/2022 1041   GLUCOSE 211 (H) 07/28/2022 1102   BUN 22 07/27/2022 1102   BUN 17 02/26/2022 1128  CREATININE 0.80 07/07/2022 1102   CREATININE 0.92 02/12/2016 1104   CALCIUM 9.6 07/22/2022 1041   PROT 6.7 07/22/2022 1041   ALBUMIN 3.4 (L) 07/29/2022 1041   AST 26 08/02/2022 1041   ALT 34 07/19/2022 1041   ALKPHOS 152 (H) 07/23/2022 1041   BILITOT 1.4 (H) 07/26/2022 1041   GFRNONAA 56 (L) 07/23/2022 1041   GFRAA 57 (L) 07/31/2020 1153    Imaging: CT MAXILLOFACIAL WO CONTRAST  Result Date: 07/06/2022 CLINICAL DATA:  Fall with bruising under the right eye EXAM: CT MAXILLOFACIAL WITHOUT CONTRAST TECHNIQUE: Multidetector CT imaging of the maxillofacial structures was performed. Multiplanar CT image reconstructions were also generated. RADIATION DOSE REDUCTION: This exam was performed according to the departmental dose-optimization program which includes automated exposure control, adjustment of the mA and/or kV according to patient size and/or use of iterative reconstruction technique. COMPARISON:  Head CT 07/25/2022 FINDINGS: Osseous: Mastoid air cells are clear. Mandibular heads are normally position. No mandibular fracture. Pterygoid plates and zygomatic arches are intact. No acute nasal bone fracture Orbits: Acute minimally displaced left orbital floor fracture. No displacement of intra-ocular contents. Sinuses: Moderate left maxillary hemosinus. Soft tissues: Mild left periorbital soft tissue swelling Limited intracranial: See separately dictated head CT IMPRESSION: Acute minimally displaced left orbital  floor fracture with moderate left maxillary hemosinus Electronically Signed   By: Donavan Foil M.D.   On: 07/13/2022 19:42   DG Knee Complete 4 Views Left  Result Date: 07/25/2022 CLINICAL DATA:  Fall with lateral knee pain and LEFT hip pain. EXAM: LEFT KNEE - COMPLETE 4+ VIEW COMPARISON:  Femoral evaluation of the same date. FINDINGS: Mild tricompartmental degenerative changes about the knee. No sign of joint effusion. Moderate chondrocalcinosis. No acute fracture or dislocation about the LEFT the. Soft tissues are unremarkable. IMPRESSION: 1. No acute fracture or dislocation. Mild tricompartmental degenerative changes. 2. Moderate chondrocalcinosis. Electronically Signed   By: Zetta Bills M.D.   On: 07/21/2022 14:58   CT CHEST ABDOMEN PELVIS W CONTRAST  Result Date: 08/02/2022 CLINICAL DATA:  An 88 year old female presents following mechanical fall for assessment of trauma. EXAM: CT CHEST, ABDOMEN, AND PELVIS WITH CONTRAST TECHNIQUE: Multidetector CT imaging of the chest, abdomen and pelvis was performed following the standard protocol during bolus administration of intravenous contrast. RADIATION DOSE REDUCTION: This exam was performed according to the departmental dose-optimization program which includes automated exposure control, adjustment of the mA and/or kV according to patient size and/or use of iterative reconstruction technique. CONTRAST:  181m OMNIPAQUE IOHEXOL 300 MG/ML  SOLN COMPARISON:  April of 2022 CT of the chest. FINDINGS: CT CHEST FINDINGS Cardiovascular: Marked cardiomegaly with signs of median sternotomy for CABG. LEFT atrial and RIGHT heart enlargement. No pericardial effusion. Central pulmonary vasculature is of normal caliber and is unremarkable on venous phase. Aortic atherosclerosis both calcified and noncalcified of the thoracic aorta. No signs of dilation or aortic injury. Three-vessel branching pattern in the chest. Mediastinum/Nodes: No hematoma in the mediastinum. No  adenopathy in the chest. Lungs/Pleura: Basilar atelectasis, stable mild subpleural reticulation at the lung bases. Stable benign pulmonary nodule in the RIGHT lung base (image 101/5) 3 mm. Airways are patent. No pneumothorax. No pleural effusion. Musculoskeletal: See below for full musculoskeletal details. CT ABDOMEN PELVIS FINDINGS Hepatobiliary: Reflux of contrast into the hepatic veins. Lobular hepatic contours. No focal, suspicious hepatic lesion or signs of hepatic trauma. Post cholecystectomy without biliary duct distension. Portal vein is patent. Pancreas: Pancreatic atrophy without signs of inflammation. No ductal dilation. No signs  of trauma to the pancreas. Spleen: Smooth contours without signs of trauma or focal, suspicious lesion. Normal size. Adrenals/Urinary Tract: Adrenal glands are normal. Symmetric renal enhancement without signs of hydronephrosis or perinephric stranding. Signs of renal trauma. No signs of renal trauma. Urinary bladder with smooth contours. No suspicious renal lesion or Stomach/Bowel: Colonic diverticulosis in sigmoid diverticular disease. No acute gastrointestinal process. Appendix not visible but no secondary signs to suggest acute appendiceal process. Vascular/Lymphatic: Smooth aortic contour with normal caliber and extensive calcified and noncalcified aortic atherosclerotic plaque. No signs of adenopathy in the abdomen or in the pelvis. Reproductive: Post hysterectomy. Other: No sign of free air.  No evidence of pelvic ascites. Musculoskeletal: Visualized clavicles and scapulae are intact. No displaced rib fractures. Sternum with median sternotomy changes. Costochondral elements are intact. No body wall contusion aside from contusion overlying the LEFT gluteal region and hip. Spinal degenerative changes without signs of spinal malalignment. Comminuted fracture of the intertrochanteric LEFT proximal femur. Femoral head is located within the acetabulum. Intramuscular hematoma of  gluteal musculature on the LEFT is small. Bilateral SI joints are symmetric.  Symphysis pubis is intact. IMPRESSION: 1. Comminuted fracture of the intertrochanteric LEFT proximal femur. Associated with marked comminution and Verus angulation. 2. Intramuscular hematoma of the LEFT gluteal musculature is small. 3. Marked cardiomegaly with signs of median sternotomy for CABG. 4. Lobular hepatic contours, correlate with any clinical or laboratory evidence of liver disease. Patient is at risk for cardiogenic liver disease based on the appearance of the heart and signs of RIGHT heart dysfunction described above with reflux of contrast into hepatic veins. 5. Colonic diverticulosis without evidence of acute gastrointestinal process. 6. Aortic atherosclerosis. Aortic Atherosclerosis (ICD10-I70.0). Electronically Signed   By: Zetta Bills M.D.   On: 07/07/2022 13:22   CT HEAD WO CONTRAST  Result Date: 07/13/2022 CLINICAL DATA:  Mechanical fall EXAM: CT HEAD WITHOUT CONTRAST TECHNIQUE: Contiguous axial images were obtained from the base of the skull through the vertex without intravenous contrast. RADIATION DOSE REDUCTION: This exam was performed according to the departmental dose-optimization program which includes automated exposure control, adjustment of the mA and/or kV according to patient size and/or use of iterative reconstruction technique. COMPARISON:  None Available. FINDINGS: Brain: No evidence of acute infarction, hemorrhage, hydrocephalus, extra-axial collection or mass lesion/mass effect. Vascular: No hyperdense vessel or unexpected calcification. Skull: No acute fracture or focal lesion. Of note, there is motion artifact at the skull base, which slightly limits evaluation. Sinuses/Orbits: Partial opacification of the left maxillary sinus. Other: None. IMPRESSION: 1. No definite acute intracranial abnormality. 2. Partial opacification of the left maxillary sinus, which may be related to chronic sinusitis.  However, a subtle nondisplaced sinus fracture and resulting blood products is not excluded. If there is persistent clinical concern, consider further assessment with CT facial bone. Electronically Signed   By: Beryle Flock M.D.   On: 07/21/2022 13:09   CT CERVICAL SPINE WO CONTRAST  Result Date: 07/22/2022 CLINICAL DATA:  Polytrauma, blunt. EXAM: CT CERVICAL SPINE WITHOUT CONTRAST TECHNIQUE: Multidetector CT imaging of the cervical spine was performed without intravenous contrast. Multiplanar CT image reconstructions were also generated. RADIATION DOSE REDUCTION: This exam was performed according to the departmental dose-optimization program which includes automated exposure control, adjustment of the mA and/or kV according to patient size and/or use of iterative reconstruction technique. COMPARISON:  None Available. FINDINGS: Alignment: Straightening/mild reversal of the normal cervical lordosis. Mild right convex curvature of the cervical spine. Minimal anterolisthesis of C3 on C4,  C4 on C5, and C7 on T1, likely degenerative. Skull base and vertebrae: No acute fracture or suspicious osseous lesion. Moderate C1-2 arthropathy with noncompressive ligamentous thickening and calcification posterior to the dens. Soft tissues and spinal canal: No prevertebral fluid or swelling. No visible canal hematoma. Disc levels: Advanced disc degeneration at C5-6 and C6-7. Widespread cervical and upper thoracic facet arthrosis. Multilevel neural foraminal stenosis due to uncovertebral and facet spurring, moderate on the right at C6-7. No evidence of high-grade spinal canal stenosis. Upper chest: More fully evaluated on separate chest CT. Other: Carotid atherosclerosis. IMPRESSION: 1. No acute cervical spine fracture. 2. Advanced multilevel disc and facet degeneration. Electronically Signed   By: Logan Bores M.D.   On: 07/20/2022 13:08   DG Pelvis Portable  Result Date: 07/22/2022 CLINICAL DATA:  Mechanical fall from  standing EXAM: PORTABLE PELVIS 1 VIEW COMPARISON:  None Available. FINDINGS: Mild diffuse osteopenia. There is an apparent cortical discontinuity at the superior aspect of the left lesser trochanter, which may represent a nondisplaced fracture. However, there is limited assessment on single AP view and this may represent superimposed osseous structures. There is no evidence of pelvic diastasis. No pelvic bone lesions are seen. Moderate multilevel degenerative changes of the visualized lower lumbar spine. Moderate bilateral hip osteoarthritis. IMPRESSION: Questioned nondisplaced fracture of the left lesser trochanter. However, there is limited assessment on the provided single AP view. If there is persistent clinical concern, recommend multi-view left hip radiograph for further assessment. Electronically Signed   By: Beryle Flock M.D.   On: 07/11/2022 11:21   DG FEMUR PORT 1V LEFT  Result Date: 07/19/2022 CLINICAL DATA:  Fall EXAM: LEFT FEMUR PORTABLE 1 VIEW COMPARISON:  None Available. FINDINGS: No radiographic abnormality of the distal femur seen in single frontal view only. IMPRESSION: No radiographic abnormality of the distal femur seen in single frontal view only. Electronically Signed   By: Delanna Ahmadi M.D.   On: 07/22/2022 11:15   DG Chest Port 1 View  Result Date: 07/08/2022 CLINICAL DATA:  w 88 year old female post fall. EXAM: PORTABLE CHEST 1 VIEW COMPARISON:  Mar 18, 2021 FINDINGS: Post median sternotomy for CABG. EKG leads project over the chest. Cardiomediastinal contours and hilar structures are stable following median sternotomy with signs of cardiac enlargement as before. Lungs are clear.  No pneumothorax.  No sign of pleural effusion. On limited assessment no acute skeletal process IMPRESSION: 1. Post median sternotomy with signs of cardiac enlargement. 2. No acute cardiopulmonary findings. Electronically Signed   By: Zetta Bills M.D.   On: 08/02/2022 11:15    EKG: personally  reviewed my interpretation is atrial fibrillation, inchanged compared to 4/26.    ASSESSMENT & PLAN:    Assessment & Plan by Problem: Principal Problem:   Hip fracture [facility])   Patient is an 88 year old female with a hx of valvular afib on warfarin, mitral valve prolapse, ischemic heart disease s/p CABG, HTN, who presented with complaints of hip pain.  # Comminuted left intertrochanteric proximal femur fracture # Osteopenia Sustained after ground level fall. Small intramuscular hematoma noted on CT. High risk finding with significant mortality association.  Neurovascularly intact at this time.  Evaluated by orthopedic surgery and planned for IMN tomorrow. Femoral nerve block performed in ED by anesthesia.  - NPO at MN - Check INR tomorrow, may need Vit K  - pain control with tylenol, toradol, and oxycodone. Consider dilaudid if pain not well controlled. - PT/OT - monitor vital signs. Consider repeat  pelvis CT if hypotensive or significant drop in hgb - daily CBC, transfuse if <8 prior to surgery. - anticipate SNF placement, TOC consulted. - consider bisphosphonate as outpatient.   # Left orbital floor fracture Sustained during fall. Moderate left maxillary hemosinus noted. Vision intact, EOM intact. No pain with eye movement. No active epistaxis. No concern for acute orbit/orbital compartment syndrome at this time.  - consult ENT in AM  # Valvular afib on warfarin # MR caused by MVP INR goal 2-3. CHADSVASC score 6. Higher risk for CVA. No hx of DVT/PE/stroke, so she is lower risk for hypercoagulable event should she require Vit K. Remains in afib. Rate controlled with metoprolol. Anticoagulated with with warfarin. - hold warfarin for surgery - check INR in AM, may need Vit K - cardiac monitoring  # HFrEF 35-40% 2019 # HTN # CAD s/p CABG 2008 Does not appear to be volume overloaded at this time.  Will need to ensure Hgb >8 prior to surgery. - monitor cbc, transfuse as  necessary - restart home metoprolol - hold entresto and lasix for low normal BP  # Hx gout - allopurinol  # hypothyroidism - home synthroid  # Memory problems There is a hx of possible dementia. Per son, baseline is conversant, alert, but oriented only to self. Has help at home. Able to do ADLs.  Appears to be at baseline. Does have hx hospital delirium - delirium precautions.  - Son to stay the night to assist with re-orienting  Diet: NPO VTE: None SCDs IVF: None,None Code: Full Prior to Admission Living Arrangement: Home, living with help Anticipated Discharge Location: SNF Barriers to Discharge: medical stability Dispo: Admit patient to Inpatient with expected length of stay greater than 2 midnights.  Signed: Delene Ruffini, MD

## 2022-07-14 NOTE — Consult Note (Signed)
Reason for Consult:Left hip fx Referring Physician: Regan Lemming Time called: [id number] Time at bedside: 1347   Natasha Livingston is an 88 y.o. female.  HPI: Natasha Livingston got out of bed this morning and forgot her cane. She turned around to get it but lost her balance and fell. She struck her left side on the bedpost and had immediate left hip pain and could not get up. She was brought to the ED where x-rays showed a left hip fx and orthopedic surgery was consulted. She lives at home along, ambulates with a cane, and has hired help ~12h/day.  Past Medical History:  Diagnosis Date   Atrial fibrillation ([place])    Back pain    Bruises easily    Chest discomfort    Diverticulitis    Dizziness    DOE (dyspnea on exertion)    Epistaxis 06/16/2019   Forgetfulness    Hx of cardiovascular stress test    Lexiscan Myoview (12/15):  Apical lateral and anterolateral fixed defect, no ischemia, EF 54%; low risk   Hyperlipidemia    Hypertension    Hypothyroidism    Interstitial nephritis    Ischemic heart disease    MVP (mitral valve prolapse)    Neuropathy     Past Surgical History:  Procedure Laterality Date   CARDIOVERSION  08/2008   CORONARY ARTERY BYPASS GRAFT  04/2007   HAND SURGERY Right    OVARIAN CYST REMOVAL     PARTIAL HYSTERECTOMY     1970s   skin cancer removal      Family History  Problem Relation Age of Onset   Lung cancer Mother    Cerebral aneurysm Father    Hypertension Father    Cerebral aneurysm Other    Healthy Son    Healthy Son    Prostate cancer Son    Heart attack Neg Hx    Stroke Neg Hx     Social History:  reports that she has never smoked. She has never been exposed to tobacco smoke. She has never used smokeless tobacco. She reports that she does not drink alcohol and does not use drugs.  Allergies:  Allergies  Allergen Reactions   Amiodarone Other (See Comments)    unknown   Crestor [Rosuvastatin Calcium] Other (See Comments)    unknown   Lipitor  [Atorvastatin Calcium] Swelling    Medications: I have reviewed the patient's current medications.  Results for orders placed or performed during the hospital encounter of 07/11/2022 (from the past 48 hour(s))  Comprehensive metabolic panel     Status: Abnormal   Collection Time: 07/22/2022 10:41 AM  Result Value Ref Range   Sodium 136 135 - 145 mmol/L   Potassium 3.8 3.5 - 5.1 mmol/L   Chloride 99 98 - 111 mmol/L   CO2 23 22 - 32 mmol/L   Glucose, Bld 210 (H) 70 - 99 mg/dL    Comment: Glucose reference range applies only to samples taken after fasting for at least 8 hours.   BUN 22 8 - 23 mg/dL   Creatinine, Ser 0.98 0.44 - 1.00 mg/dL   Calcium 9.6 8.9 - 10.3 mg/dL   Total Protein 6.7 6.5 - 8.1 g/dL   Albumin 3.4 (L) 3.5 - 5.0 g/dL   AST 26 15 - 41 U/L   ALT 34 0 - 44 U/L   Alkaline Phosphatase 152 (H) 38 - 126 U/L   Total Bilirubin 1.4 (H) 0.3 - 1.2 mg/dL   GFR, Estimated  56 (L) >60 mL/min    Comment: (NOTE) Calculated using the CKD-EPI Creatinine Equation (2021)    Anion gap 14 5 - 15    Comment: Performed at [facility], [place] [address], [place], [place] [address]  CBC     Status: Abnormal   Collection Time: 07/07/2022 10:41 AM  Result Value Ref Range   WBC 9.3 4.0 - 10.5 K/uL   RBC 3.68 (L) 3.87 - 5.11 MIL/uL   Hemoglobin 12.9 12.0 - 15.0 g/dL   HCT 37.9 36.0 - 46.0 %   MCV 103.0 (H) 80.0 - 100.0 fL   MCH 35.1 (H) 26.0 - 34.0 pg   MCHC 34.0 30.0 - 36.0 g/dL   RDW 15.7 (H) 11.5 - 15.5 %   Platelets 234 150 - 400 K/uL   nRBC 0.0 0.0 - 0.2 %    Comment: Performed at [facility], [place] [address], [place], [place] [address]  Lactic acid, plasma     Status: None   Collection Time: 07/29/2022 10:41 AM  Result Value Ref Range   Lactic Acid, Venous 1.9 0.5 - 1.9 mmol/L    Comment: Performed at [facility] [address] [place], [place] [address]  Protime-INR     Status: Abnormal   Collection Time: 07/25/2022 10:41 AM  Result Value Ref Range   Prothrombin  Time 23.6 (H) 11.4 - 15.2 seconds   INR 2.1 (H) 0.8 - 1.2    Comment: (NOTE) INR goal varies based on device and disease states. Performed at [facility], [place] [address], [place], [place] [address]   Ethanol     Status: None   Collection Time: 07/26/2022 11:00 AM  Result Value Ref Range   Alcohol, Ethyl (B) <10 <10 mg/dL    Comment: (NOTE) Lowest detectable limit for serum alcohol is 10 mg/dL.  For medical purposes only. Performed at [facility], [place] [address], [place], [place] [address]   I-Stat Chem 8, ED     Status: Abnormal   Collection Time: 07/16/2022 11:02 AM  Result Value Ref Range   Sodium 136 135 - 145 mmol/L   Potassium 3.7 3.5 - 5.1 mmol/L   Chloride 101 98 - 111 mmol/L   BUN 22 8 - 23 mg/dL   Creatinine, Ser 0.80 0.44 - 1.00 mg/dL   Glucose, Bld 211 (H) 70 - 99 mg/dL    Comment: Glucose reference range applies only to samples taken after fasting for at least 8 hours.   Calcium, Ion 1.13 (L) 1.15 - 1.40 mmol/L   TCO2 24 22 - 32 mmol/L   Hemoglobin 13.3 12.0 - 15.0 g/dL   HCT 39.0 36.0 - 46.0 %    CT CHEST ABDOMEN PELVIS W CONTRAST  Result Date: 07/12/2022 CLINICAL DATA:  An 88 year old female presents following mechanical fall for assessment of trauma. EXAM: CT CHEST, ABDOMEN, AND PELVIS WITH CONTRAST TECHNIQUE: Multidetector CT imaging of the chest, abdomen and pelvis was performed following the standard protocol during bolus administration of intravenous contrast. RADIATION DOSE REDUCTION: This exam was performed according to the departmental dose-optimization program which includes automated exposure control, adjustment of the mA and/or kV according to patient size and/or use of iterative reconstruction technique. CONTRAST:  137m OMNIPAQUE IOHEXOL 300 MG/ML  SOLN COMPARISON:  April of 2022 CT of the chest. FINDINGS: CT CHEST FINDINGS Cardiovascular: Marked cardiomegaly with signs of median sternotomy for CABG. LEFT atrial and RIGHT heart enlargement. No  pericardial effusion.  Central pulmonary vasculature is of normal caliber and is unremarkable on venous phase. Aortic atherosclerosis both calcified and noncalcified of the thoracic aorta. No signs of dilation or aortic injury. Three-vessel branching pattern in the chest. Mediastinum/Nodes: No hematoma in the mediastinum. No adenopathy in the chest. Lungs/Pleura: Basilar atelectasis, stable mild subpleural reticulation at the lung bases. Stable benign pulmonary nodule in the RIGHT lung base (image 101/5) 3 mm. Airways are patent. No pneumothorax. No pleural effusion. Musculoskeletal: See below for full musculoskeletal details. CT ABDOMEN PELVIS FINDINGS Hepatobiliary: Reflux of contrast into the hepatic veins. Lobular hepatic contours. No focal, suspicious hepatic lesion or signs of hepatic trauma. Post cholecystectomy without biliary duct distension. Portal vein is patent. Pancreas: Pancreatic atrophy without signs of inflammation. No ductal dilation. No signs of trauma to the pancreas. Spleen: Smooth contours without signs of trauma or focal, suspicious lesion. Normal size. Adrenals/Urinary Tract: Adrenal glands are normal. Symmetric renal enhancement without signs of hydronephrosis or perinephric stranding. Signs of renal trauma. No signs of renal trauma. Urinary bladder with smooth contours. No suspicious renal lesion or Stomach/Bowel: Colonic diverticulosis in sigmoid diverticular disease. No acute gastrointestinal process. Appendix not visible but no secondary signs to suggest acute appendiceal process. Vascular/Lymphatic: Smooth aortic contour with normal caliber and extensive calcified and noncalcified aortic atherosclerotic plaque. No signs of adenopathy in the abdomen or in the pelvis. Reproductive: Post hysterectomy. Other: No sign of free air.  No evidence of pelvic ascites. Musculoskeletal: Visualized clavicles and scapulae are intact. No displaced rib fractures. Sternum with median sternotomy changes.  Costochondral elements are intact. No body wall contusion aside from contusion overlying the LEFT gluteal region and hip. Spinal degenerative changes without signs of spinal malalignment. Comminuted fracture of the intertrochanteric LEFT proximal femur. Femoral head is located within the acetabulum. Intramuscular hematoma of gluteal musculature on the LEFT is small. Bilateral SI joints are symmetric.  Symphysis pubis is intact. IMPRESSION: 1. Comminuted fracture of the intertrochanteric LEFT proximal femur. Associated with marked comminution and Verus angulation. 2. Intramuscular hematoma of the LEFT gluteal musculature is small. 3. Marked cardiomegaly with signs of median sternotomy for CABG. 4. Lobular hepatic contours, correlate with any clinical or laboratory evidence of liver disease. Patient is at risk for cardiogenic liver disease based on the appearance of the heart and signs of RIGHT heart dysfunction described above with reflux of contrast into hepatic veins. 5. Colonic diverticulosis without evidence of acute gastrointestinal process. 6. Aortic atherosclerosis. Aortic Atherosclerosis (ICD10-I70.0). Electronically Signed   By: Zetta Bills M.D.   On: 07/31/2022 13:22   CT HEAD WO CONTRAST  Result Date: 07/08/2022 CLINICAL DATA:  Mechanical fall EXAM: CT HEAD WITHOUT CONTRAST TECHNIQUE: Contiguous axial images were obtained from the base of the skull through the vertex without intravenous contrast. RADIATION DOSE REDUCTION: This exam was performed according to the departmental dose-optimization program which includes automated exposure control, adjustment of the mA and/or kV according to patient size and/or use of iterative reconstruction technique. COMPARISON:  None Available. FINDINGS: Brain: No evidence of acute infarction, hemorrhage, hydrocephalus, extra-axial collection or mass lesion/mass effect. Vascular: No hyperdense vessel or unexpected calcification. Skull: No acute fracture or focal  lesion. Of note, there is motion artifact at the skull base, which slightly limits evaluation. Sinuses/Orbits: Partial opacification of the left maxillary sinus. Other: None. IMPRESSION: 1. No definite acute intracranial abnormality. 2. Partial opacification of the left maxillary sinus, which may be related to chronic sinusitis. However, a subtle nondisplaced sinus fracture and resulting blood products  is not excluded. If there is persistent clinical concern, consider further assessment with CT facial bone. Electronically Signed   By: Beryle Flock M.D.   On: 07/18/2022 13:09   CT CERVICAL SPINE WO CONTRAST  Result Date: 07/07/2022 CLINICAL DATA:  Polytrauma, blunt. EXAM: CT CERVICAL SPINE WITHOUT CONTRAST TECHNIQUE: Multidetector CT imaging of the cervical spine was performed without intravenous contrast. Multiplanar CT image reconstructions were also generated. RADIATION DOSE REDUCTION: This exam was performed according to the departmental dose-optimization program which includes automated exposure control, adjustment of the mA and/or kV according to patient size and/or use of iterative reconstruction technique. COMPARISON:  None Available. FINDINGS: Alignment: Straightening/mild reversal of the normal cervical lordosis. Mild right convex curvature of the cervical spine. Minimal anterolisthesis of C3 on C4, C4 on C5, and C7 on T1, likely degenerative. Skull base and vertebrae: No acute fracture or suspicious osseous lesion. Moderate C1-2 arthropathy with noncompressive ligamentous thickening and calcification posterior to the dens. Soft tissues and spinal canal: No prevertebral fluid or swelling. No visible canal hematoma. Disc levels: Advanced disc degeneration at C5-6 and C6-7. Widespread cervical and upper thoracic facet arthrosis. Multilevel neural foraminal stenosis due to uncovertebral and facet spurring, moderate on the right at C6-7. No evidence of high-grade spinal canal stenosis. Upper chest: More  fully evaluated on separate chest CT. Other: Carotid atherosclerosis. IMPRESSION: 1. No acute cervical spine fracture. 2. Advanced multilevel disc and facet degeneration. Electronically Signed   By: Logan Bores M.D.   On: 08/01/2022 13:08   DG Pelvis Portable  Result Date: 07/18/2022 CLINICAL DATA:  Mechanical fall from standing EXAM: PORTABLE PELVIS 1 VIEW COMPARISON:  None Available. FINDINGS: Mild diffuse osteopenia. There is an apparent cortical discontinuity at the superior aspect of the left lesser trochanter, which may represent a nondisplaced fracture. However, there is limited assessment on single AP view and this may represent superimposed osseous structures. There is no evidence of pelvic diastasis. No pelvic bone lesions are seen. Moderate multilevel degenerative changes of the visualized lower lumbar spine. Moderate bilateral hip osteoarthritis. IMPRESSION: Questioned nondisplaced fracture of the left lesser trochanter. However, there is limited assessment on the provided single AP view. If there is persistent clinical concern, recommend multi-view left hip radiograph for further assessment. Electronically Signed   By: Beryle Flock M.D.   On: 07/10/2022 11:21   DG FEMUR PORT 1V LEFT  Result Date: 07/05/2022 CLINICAL DATA:  Fall EXAM: LEFT FEMUR PORTABLE 1 VIEW COMPARISON:  None Available. FINDINGS: No radiographic abnormality of the distal femur seen in single frontal view only. IMPRESSION: No radiographic abnormality of the distal femur seen in single frontal view only. Electronically Signed   By: Delanna Ahmadi M.D.   On: 07/25/2022 11:15   DG Chest Port 1 View  Result Date: 07/12/2022 CLINICAL DATA:  w 88 year old female post fall. EXAM: PORTABLE CHEST 1 VIEW COMPARISON:  Mar 18, 2021 FINDINGS: Post median sternotomy for CABG. EKG leads project over the chest. Cardiomediastinal contours and hilar structures are stable following median sternotomy with signs of cardiac enlargement as  before. Lungs are clear.  No pneumothorax.  No sign of pleural effusion. On limited assessment no acute skeletal process IMPRESSION: 1. Post median sternotomy with signs of cardiac enlargement. 2. No acute cardiopulmonary findings. Electronically Signed   By: Zetta Bills M.D.   On: 07/09/2022 11:15    Review of Systems  HENT:  Negative for ear discharge, ear pain, hearing loss and tinnitus.   Eyes:  Negative for  photophobia and pain.  Respiratory:  Negative for cough and shortness of breath.   Cardiovascular:  Negative for chest pain.  Gastrointestinal:  Negative for abdominal pain, nausea and vomiting.  Genitourinary:  Negative for dysuria, flank pain, frequency and urgency.  Musculoskeletal:  Positive for arthralgias (Left hip). Negative for back pain, myalgias and neck pain.  Neurological:  Negative for dizziness and headaches.  Hematological:  Does not bruise/bleed easily.  Psychiatric/Behavioral:  The patient is not nervous/anxious.    Blood pressure (!) 124/93, pulse 81, temperature 97.7 F (36.5 C), temperature source Oral, resp. rate 20, height '5\' 7"'$  (1.702 m), weight 61.2 kg, SpO2 96 %. Physical Exam Constitutional:      General: She is not in acute distress.    Appearance: She is well-developed. She is not diaphoretic.  HENT:     Head: Normocephalic and atraumatic.  Eyes:     General: No scleral icterus.       Right eye: No discharge.        Left eye: No discharge.     Conjunctiva/sclera: Conjunctivae normal.  Cardiovascular:     Rate and Rhythm: Normal rate and regular rhythm.  Pulmonary:     Effort: Pulmonary effort is normal. No respiratory distress.  Musculoskeletal:     Cervical back: Normal range of motion.     Comments: LLE No traumatic wounds, ecchymosis, or rash  Mod TTP hip/knee  No knee or ankle effusion  Knee stable to varus/ valgus and anterior/posterior stress  Sens DPN, SPN, TN intact  Motor EHL, ext, flex, evers 5/5  DP 1+, PT 0, No significant  edema  Skin:    General: Skin is warm and dry.  Neurological:     Mental Status: She is alert.  Psychiatric:        Mood and Affect: Mood normal.        Behavior: Behavior normal.     Assessment/Plan: Left hip fx -- Plan IMN tomorrow with Dr. Marcelino Scot as long as INR comes down. Please keep NPO after MN. Multiple medical problems including mitral valve prolapse, atrial fibrillation on Coumadin, HTN, hypothyroidism, HLD, and diverticulitis -- per primary service    Lisette Abu, PA-C Orthopedic Surgery [phone] 07/18/2022, 1:56 PM

## 2022-07-14 NOTE — ED Provider Notes (Signed)
[facility] EMERGENCY DEPARTMENT Provider Note   CSN: [id number] Arrival date & time: 07/26/2022  1036     History  Chief Complaint  Patient presents with   Natasha Livingston is a 88 y.o. female.   Fall     88 year old female with medical history significant for mitral valve prolapse, atrial fibrillation on Coumadin, HTN, hypothyroidism, HLD, diverticulitis who presents to the emergency department as a level 2 trauma after a fall on Coumadin.  The patient per EMS and the patient sustained a mechanical fall and landed on her bottom.  She did not have any head trauma, loss of consciousness or any trauma.  Per EMS, she dementia at baseline and arrived at her baseline mental status but I do not see a diagnosis of dementia in the EMR and there is no family bedside.  GCS 14, ABC intact.  She complained of pain to her left hip.    Home Medications Prior to Admission medications   Medication Sig Start Date End Date Taking? Authorizing Provider  acetaminophen (TYLENOL) 500 MG tablet Take 500 mg by mouth every 6 (six) hours as needed for moderate pain (For pain.).    [provider]  allopurinol (ZYLOPRIM) 300 MG tablet Take 300 mg by mouth daily.  01/03/18   [provider]  benzonatate (TESSALON) 100 MG capsule Take 1 capsule (100 mg total) by mouth every 8 (eight) hours. 06/01/22   Ward, Lenise Arena, PA-C  colchicine 0.6 MG tablet Take 0.6 mg by mouth daily as needed (For gout flare-ups.).     [provider]  DULoxetine (CYMBALTA) 60 MG capsule Take 60 mg by mouth daily. Patient not taking: Reported on 04/03/2022 03/12/22   [provider]  furosemide (LASIX) 40 MG tablet TAKE 1 TABLET BY MOUTH TWICE A DAY 05/12/22   Jerline Pain, MD  KLOR-CON M20 20 MEQ tablet TAKE 2 TABLETS BY MOUTH DAILY 04/08/22   Jerline Pain, MD  levothyroxine (SYNTHROID) 25 MCG tablet Take 25 mcg by mouth daily before breakfast.    [provider]   metoprolol succinate (TOPROL-XL) 50 MG 24 hr tablet TAKE 1 TABLET BY MOUTH TWICE A DAY WITH OR IMMEDIATELY FOLLOWING A MEAL 05/12/22   Jerline Pain, MD  nitroGLYCERIN (NITROSTAT) 0.4 MG SL tablet Place 1 tablet (0.4 mg total) under the tongue every 5 (five) minutes as needed for chest pain. 03/28/21   Jerline Pain, MD  sacubitril-valsartan (ENTRESTO) 24-26 MG Take 1 tablet by mouth 2 (two) times daily. 02/26/22   Marylu Lund., NP  warfarin (COUMADIN) 5 MG tablet TAKE 1/2 TO 1 TABLET ONCE DAILY AS DIRECTED BY COUMADIN CLINIC 05/12/22   Jerline Pain, MD      Allergies    Amiodarone, Crestor [rosuvastatin calcium], and Lipitor [atorvastatin calcium]    Review of Systems   Review of Systems  Unable to perform ROS: Dementia    Physical Exam Updated Vital Signs BP (!) 124/93 (BP Location: Right Arm)   Pulse 81   Temp 97.7 F (36.5 C) (Oral)   Resp 20   Ht '5\' 7"'$  (1.702 m)   Wt 61.2 kg   SpO2 96%   BMI 21.14 kg/m  Physical Exam Vitals and nursing note reviewed.  Constitutional:      General: She is not in acute distress.    Appearance: She is well-developed.     Comments: GCS 14, ABC intact  HENT:  Head: Normocephalic and atraumatic.  Eyes:     Extraocular Movements: Extraocular movements intact.     Conjunctiva/sclera: Conjunctivae normal.     Pupils: Pupils are equal, round, and reactive to light.  Neck:     Comments: No midline tenderness to palpation of the cervical spine.  Range of motion intact Cardiovascular:     Rate and Rhythm: Normal rate and regular rhythm.  Pulmonary:     Effort: Pulmonary effort is normal. No respiratory distress.     Breath sounds: Normal breath sounds.  Chest:     Comments: Clavicles stable nontender to AP compression.  Chest wall stable and nontender to AP and lateral compression. Abdominal:     Palpations: Abdomen is soft.     Tenderness: There is no abdominal tenderness.  Musculoskeletal:     Cervical back: Neck supple.      Right lower leg: No edema.     Left lower leg: No edema.     Comments: No midline tenderness to palpation of the thoracic or lumbar spine.  Extremities atraumatic with intact range of motion with the exception of tenderness to palpation in th palpation about the trochanter, pain with attempted range of motion of the left hip.  Distally she is neurovascularly intact  Skin:    General: Skin is warm and dry.  Neurological:     Mental Status: She is alert.     Comments: Cranial nerves II through XII grossly intact.  Moving all 4 extremities spontaneously.  Sensation grossly intact all 4 extremities     ED Results / Procedures / Treatments   Labs (all labs ordered are listed, but only abnormal results are displayed) Labs Reviewed  COMPREHENSIVE METABOLIC PANEL - Abnormal; Notable for the following components:      Result Value   Glucose, Bld 210 (*)    Albumin 3.4 (*)    Alkaline Phosphatase 152 (*)    Total Bilirubin 1.4 (*)    GFR, Estimated 56 (*)    All other components within normal limits  CBC - Abnormal; Notable for the following components:   RBC 3.68 (*)    MCV 103.0 (*)    MCH 35.1 (*)    RDW 15.7 (*)    All other components within normal limits  PROTIME-INR - Abnormal; Notable for the following components:   Prothrombin Time 23.6 (*)    INR 2.1 (*)    All other components within normal limits  I-STAT CHEM 8, ED - Abnormal; Notable for the following components:   Glucose, Bld 211 (*)    Calcium, Ion 1.13 (*)    All other components within normal limits  RESP PANEL BY RT-PCR (FLU A&B, COVID) ARPGX2  ETHANOL  LACTIC ACID, PLASMA  URINALYSIS, ROUTINE W REFLEX MICROSCOPIC  AMMONIA  I-STAT VENOUS BLOOD GAS, ED    EKG EKG Interpretation  Date/Time:  Monday July 14 2022 10:41:18 EDT Ventricular Rate:  85 PR Interval:    QRS Duration: 100 QT Interval:  403 QTC Calculation: 480 R Axis:   95 Text Interpretation: Atrial fibrillation Right axis deviation  Anteroseptal infarct, old Repol abnrm suggests ischemia, diffuse leads Confirmed by Regan Lemming ([id number]) on 07/28/2022 11:04:35 AM  Radiology CT CHEST ABDOMEN PELVIS W CONTRAST  Result Date: 08/01/2022 CLINICAL DATA:  An 88 year old female presents following mechanical fall for assessment of trauma. EXAM: CT CHEST, ABDOMEN, AND PELVIS WITH CONTRAST TECHNIQUE: Multidetector CT imaging of the chest, abdomen and pelvis was performed following the standard protocol during  bolus administration of intravenous contrast. RADIATION DOSE REDUCTION: This exam was performed according to the departmental dose-optimization program which includes automated exposure control, adjustment of the mA and/or kV according to patient size and/or use of iterative reconstruction technique. CONTRAST:  177m OMNIPAQUE IOHEXOL 300 MG/ML  SOLN COMPARISON:  April of 2022 CT of the chest. FINDINGS: CT CHEST FINDINGS Cardiovascular: Marked cardiomegaly with signs of median sternotomy for CABG. LEFT atrial and RIGHT heart enlargement. No pericardial effusion. Central pulmonary vasculature is of normal caliber and is unremarkable on venous phase. Aortic atherosclerosis both calcified and noncalcified of the thoracic aorta. No signs of dilation or aortic injury. Three-vessel branching pattern in the chest. Mediastinum/Nodes: No hematoma in the mediastinum. No adenopathy in the chest. Lungs/Pleura: Basilar atelectasis, stable mild subpleural reticulation at the lung bases. Stable benign pulmonary nodule in the RIGHT lung base (image 101/5) 3 mm. Airways are patent. No pneumothorax. No pleural effusion. Musculoskeletal: See below for full musculoskeletal details. CT ABDOMEN PELVIS FINDINGS Hepatobiliary: Reflux of contrast into the hepatic veins. Lobular hepatic contours. No focal, suspicious hepatic lesion or signs of hepatic trauma. Post cholecystectomy without biliary duct distension. Portal vein is patent. Pancreas: Pancreatic atrophy without  signs of inflammation. No ductal dilation. No signs of trauma to the pancreas. Spleen: Smooth contours without signs of trauma or focal, suspicious lesion. Normal size. Adrenals/Urinary Tract: Adrenal glands are normal. Symmetric renal enhancement without signs of hydronephrosis or perinephric stranding. Signs of renal trauma. No signs of renal trauma. Urinary bladder with smooth contours. No suspicious renal lesion or Stomach/Bowel: Colonic diverticulosis in sigmoid diverticular disease. No acute gastrointestinal process. Appendix not visible but no secondary signs to suggest acute appendiceal process. Vascular/Lymphatic: Smooth aortic contour with normal caliber and extensive calcified and noncalcified aortic atherosclerotic plaque. No signs of adenopathy in the abdomen or in the pelvis. Reproductive: Post hysterectomy. Other: No sign of free air.  No evidence of pelvic ascites. Musculoskeletal: Visualized clavicles and scapulae are intact. No displaced rib fractures. Sternum with median sternotomy changes. Costochondral elements are intact. No body wall contusion aside from contusion overlying the LEFT gluteal region and hip. Spinal degenerative changes without signs of spinal malalignment. Comminuted fracture of the intertrochanteric LEFT proximal femur. Femoral head is located within the acetabulum. Intramuscular hematoma of gluteal musculature on the LEFT is small. Bilateral SI joints are symmetric.  Symphysis pubis is intact. IMPRESSION: 1. Comminuted fracture of the intertrochanteric LEFT proximal femur. Associated with marked comminution and Verus angulation. 2. Intramuscular hematoma of the LEFT gluteal musculature is small. 3. Marked cardiomegaly with signs of median sternotomy for CABG. 4. Lobular hepatic contours, correlate with any clinical or laboratory evidence of liver disease. Patient is at risk for cardiogenic liver disease based on the appearance of the heart and signs of RIGHT heart dysfunction  described above with reflux of contrast into hepatic veins. 5. Colonic diverticulosis without evidence of acute gastrointestinal process. 6. Aortic atherosclerosis. Aortic Atherosclerosis (ICD10-I70.0). Electronically Signed   By: GZetta BillsM.D.   On: 07/28/2022 13:22   CT HEAD WO CONTRAST  Result Date: 07/26/2022 CLINICAL DATA:  Mechanical fall EXAM: CT HEAD WITHOUT CONTRAST TECHNIQUE: Contiguous axial images were obtained from the base of the skull through the vertex without intravenous contrast. RADIATION DOSE REDUCTION: This exam was performed according to the departmental dose-optimization program which includes automated exposure control, adjustment of the mA and/or kV according to patient size and/or use of iterative reconstruction technique. COMPARISON:  None Available. FINDINGS: Brain: No evidence of  acute infarction, hemorrhage, hydrocephalus, extra-axial collection or mass lesion/mass effect. Vascular: No hyperdense vessel or unexpected calcification. Skull: No acute fracture or focal lesion. Of note, there is motion artifact at the skull base, which slightly limits evaluation. Sinuses/Orbits: Partial opacification of the left maxillary sinus. Other: None. IMPRESSION: 1. No definite acute intracranial abnormality. 2. Partial opacification of the left maxillary sinus, which may be related to chronic sinusitis. However, a subtle nondisplaced sinus fracture and resulting blood products is not excluded. If there is persistent clinical concern, consider further assessment with CT facial bone. Electronically Signed   By: Beryle Flock M.D.   On: 07/24/2022 13:09   CT CERVICAL SPINE WO CONTRAST  Result Date: 07/20/2022 CLINICAL DATA:  Polytrauma, blunt. EXAM: CT CERVICAL SPINE WITHOUT CONTRAST TECHNIQUE: Multidetector CT imaging of the cervical spine was performed without intravenous contrast. Multiplanar CT image reconstructions were also generated. RADIATION DOSE REDUCTION: This exam was  performed according to the departmental dose-optimization program which includes automated exposure control, adjustment of the mA and/or kV according to patient size and/or use of iterative reconstruction technique. COMPARISON:  None Available. FINDINGS: Alignment: Straightening/mild reversal of the normal cervical lordosis. Mild right convex curvature of the cervical spine. Minimal anterolisthesis of C3 on C4, C4 on C5, and C7 on T1, likely degenerative. Skull base and vertebrae: No acute fracture or suspicious osseous lesion. Moderate C1-2 arthropathy with noncompressive ligamentous thickening and calcification posterior to the dens. Soft tissues and spinal canal: No prevertebral fluid or swelling. No visible canal hematoma. Disc levels: Advanced disc degeneration at C5-6 and C6-7. Widespread cervical and upper thoracic facet arthrosis. Multilevel neural foraminal stenosis due to uncovertebral and facet spurring, moderate on the right at C6-7. No evidence of high-grade spinal canal stenosis. Upper chest: More fully evaluated on separate chest CT. Other: Carotid atherosclerosis. IMPRESSION: 1. No acute cervical spine fracture. 2. Advanced multilevel disc and facet degeneration. Electronically Signed   By: Logan Bores M.D.   On: 07/28/2022 13:08   DG Pelvis Portable  Result Date: 07/13/2022 CLINICAL DATA:  Mechanical fall from standing EXAM: PORTABLE PELVIS 1 VIEW COMPARISON:  None Available. FINDINGS: Mild diffuse osteopenia. There is an apparent cortical discontinuity at the superior aspect of the left lesser trochanter, which may represent a nondisplaced fracture. However, there is limited assessment on single AP view and this may represent superimposed osseous structures. There is no evidence of pelvic diastasis. No pelvic bone lesions are seen. Moderate multilevel degenerative changes of the visualized lower lumbar spine. Moderate bilateral hip osteoarthritis. IMPRESSION: Questioned nondisplaced fracture  of the left lesser trochanter. However, there is limited assessment on the provided single AP view. If there is persistent clinical concern, recommend multi-view left hip radiograph for further assessment. Electronically Signed   By: Beryle Flock M.D.   On: 07/30/2022 11:21   DG FEMUR PORT 1V LEFT  Result Date: 07/23/2022 CLINICAL DATA:  Fall EXAM: LEFT FEMUR PORTABLE 1 VIEW COMPARISON:  None Available. FINDINGS: No radiographic abnormality of the distal femur seen in single frontal view only. IMPRESSION: No radiographic abnormality of the distal femur seen in single frontal view only. Electronically Signed   By: Delanna Ahmadi M.D.   On: 07/11/2022 11:15   DG Chest Port 1 View  Result Date: 07/07/2022 CLINICAL DATA:  w 88 year old female post fall. EXAM: PORTABLE CHEST 1 VIEW COMPARISON:  Mar 18, 2021 FINDINGS: Post median sternotomy for CABG. EKG leads project over the chest. Cardiomediastinal contours and hilar structures are stable following median sternotomy  with signs of cardiac enlargement as before. Lungs are clear.  No pneumothorax.  No sign of pleural effusion. On limited assessment no acute skeletal process IMPRESSION: 1. Post median sternotomy with signs of cardiac enlargement. 2. No acute cardiopulmonary findings. Electronically Signed   By: Zetta Bills M.D.   On: 07/31/2022 11:15    Procedures Procedures    Medications Ordered in ED Medications  fentaNYL (SUBLIMAZE) injection 25 mcg (has no administration in time range)  iohexol (OMNIPAQUE) 300 MG/ML solution 100 mL (100 mLs Intravenous Contrast Given 07/29/2022 1249)    ED Course/ Medical Decision Making/ A&P                           Medical Decision Making Amount and/or Complexity of Data Reviewed Labs: ordered. Radiology: ordered.  Risk Prescription drug management. Decision regarding hospitalization.      88 year old female with medical history significant for mitral valve prolapse, atrial fibrillation on  Coumadin, HTN, hypothyroidism, HLD, diverticulitis who presents to the emergency department as a level 2 trauma after a fall on Coumadin.  The patient per EMS and the patient sustained a mechanical fall and landed on her bottom.  She did not have any head trauma, loss of consciousness or any trauma.  Per EMS, she dementia at baseline and arrived at her baseline mental status but I do not see a diagnosis of dementia in the EMR and there is no family bedside.  GCS 14, ABC intact.  She complained of pain to her left hip.  Patient vitals on arrival stable, afebrile, not tachycardic or tachypneic, BP 124/93, saturating 96% on room air.  Physical exam significant for left hip tenderness to palpation, distally neurovascularly intact.  Patient was initially GCS 14 with mild confusion present.  She denied head trauma or loss of consciousness. Downgraded from a level 2 trauma on arrival.   CXR: IMPRESSION:  1. Post median sternotomy with signs of cardiac enlargement.  2. No acute cardiopulmonary findings.   Pelvis XR: IMPRESSION:  Questioned nondisplaced fracture of the left lesser trochanter.  However, there is limited assessment on the provided single AP view.  If there is persistent clinical concern, recommend multi-view left  hip radiograph for further assessment.    DG Left Femur: IMPRESSION:  No radiographic abnormality of the distal femur seen in single  frontal view only.    CT Head & Cervical Spine: IMPRESSION:  1. No definite acute intracranial abnormality.  2. Partial opacification of the left maxillary sinus, which may be  related to chronic sinusitis. However, a subtle nondisplaced sinus  fracture and resulting blood products is not excluded. If there is  persistent clinical concern, consider further assessment with CT  facial bone.    CT Chest Abdomen pelvis:   IMPRESSION:  1. Comminuted fracture of the intertrochanteric LEFT proximal femur.  Associated with marked  comminution and Verus angulation.  2. Intramuscular hematoma of the LEFT gluteal musculature is small.  3. Marked cardiomegaly with signs of median sternotomy for CABG.  4. Lobular hepatic contours, correlate with any clinical or  laboratory evidence of liver disease. Patient is at risk for  cardiogenic liver disease based on the appearance of the heart and  signs of RIGHT heart dysfunction described above with reflux of  contrast into hepatic veins.  5. Colonic diverticulosis without evidence of acute gastrointestinal  process.  6. Aortic atherosclerosis.   Due to the findings of left intertrochanteric femur fracture with comminution  and varus ambulation, orthopedic surgery, Orion Crook PA, was consulted for further recommendations and management.  We will plan for medicine consultation for admission given the patient's multiple medical comorbidities. The patient's son was updated bedside on the plan of care. The internal medicine teaching service accepted the patient in admission.   Final Clinical Impression(s) / ED Diagnoses Final diagnoses:  Fall, initial encounter  Closed displaced fracture of greater trochanter of left femur, initial encounter [facility])    Rx / DC Orders ED Discharge Orders     None         Regan Lemming, MD 07/20/2022 1411

## 2022-07-14 NOTE — ED Notes (Signed)
Pt back from CT

## 2022-07-14 NOTE — Plan of Care (Signed)
  Problem: Pain Managment: Goal: General experience of comfort will improve Outcome: Not Progressing   Problem: Safety: Goal: Ability to remain free from injury will improve Outcome: Not Progressing   Problem: Skin Integrity: Goal: Risk for impaired skin integrity will decrease Outcome: Not Progressing   

## 2022-07-14 NOTE — Anesthesia Procedure Notes (Signed)
Anesthesia Regional Block: Femoral nerve block   Pre-Anesthetic Checklist: , timeout performed,  Correct Patient, Correct Site, Correct Laterality,  Correct Procedure, Correct Position, site marked,  Risks and benefits discussed,  Surgical consent,  Pre-op evaluation,  At surgeon's request and post-op pain management  Laterality: Left  Prep: chloraprep       Needles:  Injection technique: Single-shot  Needle Type: Echogenic Stimulator Needle     Needle Length: 9cm  Needle Gauge: 21     Additional Needles:   Narrative:  Start time: 07/07/2022 3:12 PM End time: 07/16/2022 3:18 PM Injection made incrementally with aspirations every 5 mL.  Performed by: Personally  Anesthesiologist: Santa Lighter, MD  Additional Notes: Pt. tolerated procedure well. Good perineural spread visualized on [place].

## 2022-07-14 NOTE — Anesthesia Preprocedure Evaluation (Signed)
Anesthesia Evaluation  Patient identified by MRN, date of birth, ID band Patient confused    Reviewed: Allergy & Precautions, NPO status , Patient's Chart, lab work & pertinent test results  Airway Mallampati: II  TM Distance: >3 FB Neck ROM: Full    Dental   Pulmonary neg pulmonary ROS,    Pulmonary exam normal breath sounds clear to auscultation       Cardiovascular hypertension, + CAD, +CHF and + DOE  Normal cardiovascular exam+ dysrhythmias Atrial Fibrillation + Valvular Problems/Murmurs MVP  Rhythm:Regular Rate:Normal     Neuro/Psych PSYCHIATRIC DISORDERS Depression negative neurological ROS     GI/Hepatic negative GI ROS, Neg liver ROS,   Endo/Other  Hypothyroidism   Renal/GU Renal disease     Musculoskeletal negative musculoskeletal ROS (+)   Abdominal   Peds  Hematology  (+) Blood dyscrasia (Warfarin), ,   Anesthesia Other Findings Day of surgery medications reviewed with the patient.  Reproductive/Obstetrics                             Anesthesia Physical Anesthesia Plan  ASA: 3  Anesthesia Plan: Regional   Post-op Pain Management: Regional block*   Induction:   PONV Risk Score and Plan: 2 and Treatment may vary due to age or medical condition  Airway Management Planned: Natural Airway  Additional Equipment:   Intra-op Plan:   Post-operative Plan:   Informed Consent: I have reviewed the patients History and Physical, chart, labs and discussed the procedure including the risks, benefits and alternatives for the proposed anesthesia with the patient or authorized representative who has indicated his/her understanding and acceptance.     Consent reviewed with POA  Plan Discussed with: CRNA  Anesthesia Plan Comments: (Phone consent with patient's son.)        Anesthesia Quick Evaluation

## 2022-07-14 NOTE — Progress Notes (Signed)
   07/13/2022 1045  Clinical Encounter Type  Visited With Patient not available  Visit Type Initial;Trauma  Referral From Nurse  Consult/Referral To Chaplain   Chaplain responded to a level two trauma. Patient was under the care of the medical team.  No family was present. If a chaplain is requested someone will respond  [place]  [facility]  [phone]

## 2022-07-14 NOTE — Hospital Course (Addendum)
Left femur fracture, on coumadin, INR 2.1. slight decline in mental status? 14 >>10.  Consider CT pelvis.  Hx CABG Not tachypneic or hypoxic - will need hgb >8    Patient report hip pain. She fell this morning. She hit the left side of her face the end of her bed. Is not having pain.   Took warfarin yesterday. Has trouble walking  *** something about sides **doorbells Tried to get up and fell She was put on medication for her heart? In 2009 Has been going to the doctor every few weeks She denies LOC Had a lot of pain   Other medical problems  Son Gerald Stabs knows about medications Has people come to house to help her but lives alone Gerald Stabs lives 20 miles away  Can barely remember name Can't remember the day, oriented to year maybe [place] [facility] [place]

## 2022-07-14 NOTE — ED Notes (Signed)
Pt taken to CT.

## 2022-07-15 ENCOUNTER — Inpatient Hospital Stay (HOSPITAL_COMMUNITY): Payer: Medicare Other

## 2022-07-15 ENCOUNTER — Other Ambulatory Visit: Payer: Self-pay

## 2022-07-15 ENCOUNTER — Encounter (HOSPITAL_COMMUNITY)
Admission: EM | Disposition: E | Payer: Self-pay | Source: Home / Self Care | Attending: Student in an Organized Health Care Education/Training Program

## 2022-07-15 ENCOUNTER — Encounter (HOSPITAL_COMMUNITY): Payer: Self-pay | Admitting: Student in an Organized Health Care Education/Training Program

## 2022-07-15 DIAGNOSIS — M85852 Other specified disorders of bone density and structure, left thigh: Secondary | ICD-10-CM | POA: Diagnosis not present

## 2022-07-15 DIAGNOSIS — R54 Age-related physical debility: Secondary | ICD-10-CM | POA: Diagnosis not present

## 2022-07-15 DIAGNOSIS — I2510 Atherosclerotic heart disease of native coronary artery without angina pectoris: Secondary | ICD-10-CM | POA: Diagnosis not present

## 2022-07-15 DIAGNOSIS — R112 Nausea with vomiting, unspecified: Secondary | ICD-10-CM

## 2022-07-15 DIAGNOSIS — I110 Hypertensive heart disease with heart failure: Secondary | ICD-10-CM | POA: Diagnosis not present

## 2022-07-15 DIAGNOSIS — I509 Heart failure, unspecified: Secondary | ICD-10-CM

## 2022-07-15 DIAGNOSIS — S72142A Displaced intertrochanteric fracture of left femur, initial encounter for closed fracture: Secondary | ICD-10-CM

## 2022-07-15 DIAGNOSIS — S72002A Fracture of unspecified part of neck of left femur, initial encounter for closed fracture: Secondary | ICD-10-CM

## 2022-07-15 DIAGNOSIS — S0232XA Fracture of orbital floor, left side, initial encounter for closed fracture: Secondary | ICD-10-CM | POA: Diagnosis not present

## 2022-07-15 HISTORY — PX: INTRAMEDULLARY (IM) NAIL INTERTROCHANTERIC: SHX5875

## 2022-07-15 LAB — BASIC METABOLIC PANEL
Anion gap: 10 (ref 5–15)
BUN: 22 mg/dL (ref 8–23)
CO2: 25 mmol/L (ref 22–32)
Calcium: 9.5 mg/dL (ref 8.9–10.3)
Chloride: 99 mmol/L (ref 98–111)
Creatinine, Ser: 1.17 mg/dL — ABNORMAL HIGH (ref 0.44–1.00)
GFR, Estimated: 45 mL/min — ABNORMAL LOW (ref >60)
Glucose, Bld: 212 mg/dL — ABNORMAL HIGH (ref 70–99)
Potassium: 4.6 mmol/L (ref 3.5–5.1)
Sodium: 134 mmol/L — ABNORMAL LOW (ref 135–145)

## 2022-07-15 LAB — CBC
HCT: 33 % — ABNORMAL LOW (ref 36.0–46.0)
Hemoglobin: 11.5 g/dL — ABNORMAL LOW (ref 12.0–15.0)
MCH: 35.2 pg — ABNORMAL HIGH (ref 26.0–34.0)
MCHC: 34.8 g/dL (ref 30.0–36.0)
MCV: 100.9 fL — ABNORMAL HIGH (ref 80.0–100.0)
Platelets: 202 10*3/uL (ref 150–400)
RBC: 3.27 MIL/uL — ABNORMAL LOW (ref 3.87–5.11)
RDW: 15.5 % (ref 11.5–15.5)
WBC: 12.2 10*3/uL — ABNORMAL HIGH (ref 4.0–10.5)
nRBC: 0 % (ref 0.0–0.2)

## 2022-07-15 LAB — GLUCOSE, CAPILLARY
Glucose-Capillary: 219 mg/dL — ABNORMAL HIGH (ref 70–99)
Glucose-Capillary: 238 mg/dL — ABNORMAL HIGH (ref 70–99)
Glucose-Capillary: 191 mg/dL — ABNORMAL HIGH (ref 70–99)

## 2022-07-15 LAB — PROTIME-INR
INR: 1.9 — ABNORMAL HIGH (ref 0.8–1.2)
INR: 1.8 — ABNORMAL HIGH (ref 0.8–1.2)
Prothrombin Time: 21.1 seconds — ABNORMAL HIGH (ref 11.4–15.2)
Prothrombin Time: 21.1 seconds — ABNORMAL HIGH (ref 11.4–15.2)

## 2022-07-15 LAB — APTT: aPTT: 51 seconds — ABNORMAL HIGH (ref 24–36)

## 2022-07-15 SURGERY — FIXATION, FRACTURE, INTERTROCHANTERIC, WITH INTRAMEDULLARY ROD
Anesthesia: General | Site: Hip | Laterality: Left

## 2022-07-15 MED ORDER — LACTATED RINGERS IV SOLN: INTRAVENOUS | Status: DC

## 2022-07-15 MED ORDER — ALBUMIN HUMAN 5 % IV SOLN: INTRAVENOUS | Status: DC | PRN

## 2022-07-15 MED ORDER — ONDANSETRON HCL 4 MG/2ML IJ SOLN
INTRAMUSCULAR | Status: DC | PRN
  Administered 2022-07-15: 4 mg via INTRAVENOUS

## 2022-07-15 MED ORDER — ENOXAPARIN SODIUM 60 MG/0.6ML IJ SOSY
60.0000 mg | PREFILLED_SYRINGE | Freq: Two times a day (BID) | INTRAMUSCULAR | Status: DC
  Administered 2022-07-16: 60 mg via SUBCUTANEOUS
  Filled 2022-07-15: qty 0.6

## 2022-07-15 MED ORDER — PHENOL 1.4 % MT LIQD: 1.0000 | OROMUCOSAL | Status: DC | PRN

## 2022-07-15 MED ORDER — LIDOCAINE 2% (20 MG/ML) 5 ML SYRINGE
INTRAMUSCULAR | Status: AC
  Filled 2022-07-15: qty 5

## 2022-07-15 MED ORDER — TRANEXAMIC ACID-NACL 1000-0.7 MG/100ML-% IV SOLN
1000.0000 mg | Freq: Once | INTRAVENOUS | Status: AC
  Administered 2022-07-15: 1000 mg via INTRAVENOUS
  Filled 2022-07-15: qty 100

## 2022-07-15 MED ORDER — DEXMEDETOMIDINE HCL IN NACL 80 MCG/20ML IV SOLN
INTRAVENOUS | Status: AC
  Filled 2022-07-15: qty 20

## 2022-07-15 MED ORDER — CHLORHEXIDINE GLUCONATE 0.12 % MT SOLN: 15.0000 mL | Freq: Once | OROMUCOSAL | Status: AC

## 2022-07-15 MED ORDER — DEXAMETHASONE SODIUM PHOSPHATE 10 MG/ML IJ SOLN
INTRAMUSCULAR | Status: DC | PRN
  Administered 2022-07-15: 4 mg via INTRAVENOUS

## 2022-07-15 MED ORDER — VASOPRESSIN 20 UNIT/ML IV SOLN
INTRAVENOUS | Status: AC
  Filled 2022-07-15: qty 1

## 2022-07-15 MED ORDER — WARFARIN - PHARMACIST DOSING INPATIENT: Freq: Every day | Status: DC

## 2022-07-15 MED ORDER — FENTANYL CITRATE (PF) 100 MCG/2ML IJ SOLN: 25.0000 ug | INTRAMUSCULAR | Status: DC | PRN

## 2022-07-15 MED ORDER — POVIDONE-IODINE 10 % EX SWAB
2.0000 | Freq: Once | CUTANEOUS | Status: AC
  Administered 2022-07-15: 2 via TOPICAL

## 2022-07-15 MED ORDER — ENOXAPARIN SODIUM 60 MG/0.6ML IJ SOSY: 60.0000 mg | PREFILLED_SYRINGE | Freq: Two times a day (BID) | INTRAMUSCULAR | Status: DC

## 2022-07-15 MED ORDER — FENTANYL CITRATE (PF) 250 MCG/5ML IJ SOLN
INTRAMUSCULAR | Status: DC | PRN
  Administered 2022-07-15 (×2): 25 ug via INTRAVENOUS

## 2022-07-15 MED ORDER — PROPOFOL 10 MG/ML IV BOLUS
INTRAVENOUS | Status: DC | PRN
  Administered 2022-07-15: 70 mg via INTRAVENOUS

## 2022-07-15 MED ORDER — PROPOFOL 10 MG/ML IV BOLUS
INTRAVENOUS | Status: AC
  Filled 2022-07-15: qty 20

## 2022-07-15 MED ORDER — ONDANSETRON HCL 4 MG PO TABS
4.0000 mg | ORAL_TABLET | Freq: Four times a day (QID) | ORAL | Status: DC | PRN
  Administered 2022-07-15: 4 mg via ORAL
  Filled 2022-07-15: qty 1

## 2022-07-15 MED ORDER — MENTHOL 3 MG MT LOZG: 1.0000 | LOZENGE | OROMUCOSAL | Status: DC | PRN

## 2022-07-15 MED ORDER — SUGAMMADEX SODIUM 200 MG/2ML IV SOLN
INTRAVENOUS | Status: DC | PRN
  Administered 2022-07-15: 200 mg via INTRAVENOUS

## 2022-07-15 MED ORDER — LACTATED RINGERS IV SOLN: INTRAVENOUS | Status: DC | PRN

## 2022-07-15 MED ORDER — ACETAMINOPHEN 325 MG PO TABS: 325.0000 mg | ORAL_TABLET | Freq: Four times a day (QID) | ORAL | Status: DC | PRN

## 2022-07-15 MED ORDER — CEFAZOLIN SODIUM-DEXTROSE 2-4 GM/100ML-% IV SOLN
INTRAVENOUS | Status: AC
  Administered 2022-07-15: 2 g via INTRAVENOUS
  Filled 2022-07-15: qty 100

## 2022-07-15 MED ORDER — ROCURONIUM BROMIDE 10 MG/ML (PF) SYRINGE
PREFILLED_SYRINGE | INTRAVENOUS | Status: DC | PRN
  Administered 2022-07-15: 20 mg via INTRAVENOUS
  Administered 2022-07-15: 50 mg via INTRAVENOUS

## 2022-07-15 MED ORDER — FENTANYL CITRATE (PF) 250 MCG/5ML IJ SOLN
INTRAMUSCULAR | Status: AC
  Filled 2022-07-15: qty 5

## 2022-07-15 MED ORDER — ONDANSETRON HCL 4 MG/2ML IJ SOLN
INTRAMUSCULAR | Status: AC
  Filled 2022-07-15: qty 2

## 2022-07-15 MED ORDER — PHENYLEPHRINE HCL-NACL 20-0.9 MG/250ML-% IV SOLN
INTRAVENOUS | Status: DC | PRN
  Administered 2022-07-15: 25 ug/min via INTRAVENOUS

## 2022-07-15 MED ORDER — ROCURONIUM BROMIDE 10 MG/ML (PF) SYRINGE
PREFILLED_SYRINGE | INTRAVENOUS | Status: AC
  Filled 2022-07-15: qty 10

## 2022-07-15 MED ORDER — CHLORHEXIDINE GLUCONATE 4 % EX LIQD: 60.0000 mL | Freq: Once | CUTANEOUS | Status: DC

## 2022-07-15 MED ORDER — DOCUSATE SODIUM 100 MG PO CAPS
100.0000 mg | ORAL_CAPSULE | Freq: Two times a day (BID) | ORAL | Status: DC
  Administered 2022-07-16: 100 mg via ORAL
  Filled 2022-07-15: qty 1

## 2022-07-15 MED ORDER — METOCLOPRAMIDE HCL 5 MG PO TABS: 5.0000 mg | ORAL_TABLET | Freq: Three times a day (TID) | ORAL | Status: DC | PRN

## 2022-07-15 MED ORDER — INFLUENZA VAC A&B SA ADJ QUAD 0.5 ML IM PRSY: 0.5000 mL | PREFILLED_SYRINGE | INTRAMUSCULAR | Status: DC | PRN

## 2022-07-15 MED ORDER — CHLORHEXIDINE GLUCONATE 0.12 % MT SOLN
OROMUCOSAL | Status: AC
  Administered 2022-07-15: 15 mL via OROMUCOSAL
  Filled 2022-07-15: qty 15

## 2022-07-15 MED ORDER — METOCLOPRAMIDE HCL 5 MG/ML IJ SOLN: 5.0000 mg | Freq: Three times a day (TID) | INTRAMUSCULAR | Status: DC | PRN

## 2022-07-15 MED ORDER — PHYTONADIONE 5 MG PO TABS
5.0000 mg | ORAL_TABLET | Freq: Once | ORAL | Status: AC
  Administered 2022-07-15: 5 mg via ORAL
  Filled 2022-07-15: qty 1

## 2022-07-15 MED ORDER — WARFARIN SODIUM 5 MG PO TABS
5.0000 mg | ORAL_TABLET | Freq: Once | ORAL | Status: AC
  Administered 2022-07-15: 5 mg via ORAL
  Filled 2022-07-15: qty 1

## 2022-07-15 MED ORDER — SACUBITRIL-VALSARTAN 24-26 MG PO TABS
1.0000 | ORAL_TABLET | Freq: Two times a day (BID) | ORAL | Status: DC
  Administered 2022-07-15: 1 via ORAL
  Filled 2022-07-15 (×3): qty 1

## 2022-07-15 MED ORDER — CEFAZOLIN SODIUM-DEXTROSE 2-4 GM/100ML-% IV SOLN
2.0000 g | Freq: Four times a day (QID) | INTRAVENOUS | Status: AC
  Administered 2022-07-16: 2 g via INTRAVENOUS
  Filled 2022-07-15 (×2): qty 100

## 2022-07-15 MED ORDER — MORPHINE SULFATE (PF) 2 MG/ML IV SOLN
0.5000 mg | INTRAVENOUS | Status: DC | PRN
  Filled 2022-07-15: qty 1

## 2022-07-15 MED ORDER — LIDOCAINE 2% (20 MG/ML) 5 ML SYRINGE
INTRAMUSCULAR | Status: DC | PRN
  Administered 2022-07-15: 40 mg via INTRAVENOUS

## 2022-07-15 MED ORDER — INSULIN ASPART 100 UNIT/ML IJ SOLN
INTRAMUSCULAR | Status: DC | PRN
  Administered 2022-07-15: 2 [IU] via SUBCUTANEOUS

## 2022-07-15 MED ORDER — ONDANSETRON HCL 4 MG/2ML IJ SOLN
4.0000 mg | Freq: Four times a day (QID) | INTRAMUSCULAR | Status: DC | PRN
  Administered 2022-07-16: 4 mg via INTRAVENOUS
  Filled 2022-07-15: qty 2

## 2022-07-15 MED ORDER — ORAL CARE MOUTH RINSE: 15.0000 mL | Freq: Once | OROMUCOSAL | Status: AC

## 2022-07-15 MED ORDER — 0.9 % SODIUM CHLORIDE (POUR BTL) OPTIME
TOPICAL | Status: DC | PRN
  Administered 2022-07-15: 1000 mL

## 2022-07-15 MED ORDER — CEFAZOLIN SODIUM-DEXTROSE 2-4 GM/100ML-% IV SOLN
2.0000 g | INTRAVENOUS | Status: AC
  Administered 2022-07-15: 2 g via INTRAVENOUS

## 2022-07-15 MED ORDER — DEXAMETHASONE SODIUM PHOSPHATE 10 MG/ML IJ SOLN
INTRAMUSCULAR | Status: AC
  Filled 2022-07-15: qty 1

## 2022-07-15 SURGICAL SUPPLY — 55 items
BAG COUNTER SPONGE SURGICOUNT (BAG) ×1 IMPLANT
BAG SPNG CNTER NS LX DISP (BAG) ×1
BIT DRILL CANN LG 4.3MM (BIT) IMPLANT
BNDG COHESIVE 6X5 TAN STRL LF (GAUZE/BANDAGES/DRESSINGS) IMPLANT
BRUSH SCRUB EZ PLAIN DRY (MISCELLANEOUS) ×2 IMPLANT
COVER PERINEAL POST (MISCELLANEOUS) ×1 IMPLANT
COVER SURGICAL LIGHT HANDLE (MISCELLANEOUS) ×2 IMPLANT
DRAPE C-ARMOR (DRAPES) ×1 IMPLANT
DRAPE HALF SHEET 40X57 (DRAPES) IMPLANT
DRAPE INCISE IOBAN 66X45 STRL (DRAPES) ×1 IMPLANT
DRAPE ORTHO SPLIT 77X108 STRL (DRAPES)
DRAPE SURG ORHT 6 SPLT 77X108 (DRAPES) IMPLANT
DRAPE U-SHAPE 47X51 STRL (DRAPES) ×1 IMPLANT
DRESSING MEPILEX FLEX 4X4 (GAUZE/BANDAGES/DRESSINGS) IMPLANT
DRILL BIT CANN LG 4.3MM (BIT) ×1
DRSG EMULSION OIL 3X3 NADH (GAUZE/BANDAGES/DRESSINGS) ×1 IMPLANT
DRSG MEPILEX BORDER 4X4 (GAUZE/BANDAGES/DRESSINGS) ×1 IMPLANT
DRSG MEPILEX BORDER 4X8 (GAUZE/BANDAGES/DRESSINGS) ×1 IMPLANT
DRSG MEPILEX FLEX 4X4 (GAUZE/BANDAGES/DRESSINGS) ×3
ELECT REM PT RETURN 9FT ADLT (ELECTROSURGICAL) ×1
ELECTRODE REM PT RTRN 9FT ADLT (ELECTROSURGICAL) ×1 IMPLANT
GLOVE BIO SURGEON STRL SZ7.5 (GLOVE) ×1 IMPLANT
GLOVE BIO SURGEON STRL SZ8 (GLOVE) ×1 IMPLANT
GLOVE BIOGEL PI IND STRL 7.5 (GLOVE) ×1 IMPLANT
GLOVE BIOGEL PI IND STRL 8 (GLOVE) ×1 IMPLANT
GLOVE SURG ORTHO LTX SZ7.5 (GLOVE) ×2 IMPLANT
GOWN STRL REUS W/ TWL LRG LVL3 (GOWN DISPOSABLE) ×2 IMPLANT
GOWN STRL REUS W/ TWL XL LVL3 (GOWN DISPOSABLE) ×1 IMPLANT
GOWN STRL REUS W/TWL LRG LVL3 (GOWN DISPOSABLE) ×2
GOWN STRL REUS W/TWL XL LVL3 (GOWN DISPOSABLE) ×1
GUIDEPIN VERSANAIL DSP 3.2X444 (ORTHOPEDIC DISPOSABLE SUPPLIES) IMPLANT
GUIDEWIRE BALL NOSE 100CM (WIRE) IMPLANT
HIP FRA NAIL LAG SCREW 10.5X90 (Orthopedic Implant) ×1 IMPLANT
KIT BASIN OR (CUSTOM PROCEDURE TRAY) ×1 IMPLANT
KIT TURNOVER KIT B (KITS) ×1 IMPLANT
MANIFOLD NEPTUNE II (INSTRUMENTS) ×1 IMPLANT
NAIL HIP FRACT 130D 11X180 (Screw) IMPLANT
NAIL HIP FRACT 130D 9X180 (Orthopedic Implant) IMPLANT
NS IRRIG 1000ML POUR BTL (IV SOLUTION) ×1 IMPLANT
PACK GENERAL/GYN (CUSTOM PROCEDURE TRAY) ×1 IMPLANT
PAD ARMBOARD 7.5X6 YLW CONV (MISCELLANEOUS) ×2 IMPLANT
SCREW BONE CORTICAL 5.0X32 (Screw) IMPLANT
SCREW LAG HIP FRA NAIL 10.5X90 (Orthopedic Implant) IMPLANT
STAPLER VISISTAT 35W (STAPLE) ×1 IMPLANT
STOCKINETTE IMPERVIOUS LG (DRAPES) IMPLANT
SUT ETHILON 2 0 FS 18 (SUTURE) ×1 IMPLANT
SUT VIC AB 0 CT1 27 (SUTURE) ×1
SUT VIC AB 0 CT1 27XBRD ANBCTR (SUTURE) ×1 IMPLANT
SUT VIC AB 1 CT1 27 (SUTURE)
SUT VIC AB 1 CT1 27XBRD ANBCTR (SUTURE) ×1 IMPLANT
SUT VIC AB 2-0 CT1 27 (SUTURE) ×1
SUT VIC AB 2-0 CT1 TAPERPNT 27 (SUTURE) ×1 IMPLANT
TOWEL GREEN STERILE (TOWEL DISPOSABLE) ×2 IMPLANT
TOWEL GREEN STERILE FF (TOWEL DISPOSABLE) ×1 IMPLANT
WATER STERILE IRR 1000ML POUR (IV SOLUTION) ×1 IMPLANT

## 2022-07-15 NOTE — Progress Notes (Signed)
PT Cancellation Note  Patient Details Name: Natasha Livingston MRN: [id number] DOB: Mar 17, 1934   Cancelled Treatment:    Reason Eval/Treat Not Completed: Patient at procedure or test/unavailable  Noted off the floor at this time for surgical fixation of hip fracture;   Will follow up for PT eval postop;   Roney Marion, PT  Acute Rehabilitation Services Office [phone]    [name] 08/01/2022, 11:42 AM

## 2022-07-15 NOTE — Progress Notes (Signed)
ANTICOAGULATION CONSULT NOTE - Follow Up Consult  Pharmacy Consult for Lovenox / Warfarin Indication: atrial fibrillation  Allergies  Allergen Reactions   Crestor [Rosuvastatin Calcium] Other (See Comments)    Unknown reaction    Lipitor [Atorvastatin Calcium] Swelling   Pacerone [Amiodarone] Other (See Comments)    Unknown reaction     Patient Measurements: Height: '5\' 7"'$  (170.2 cm) Weight: 63.3 kg (139 lb 8.8 oz) IBW/kg (Calculated) : 61.6  Vital Signs: Temp: 97.4 F (36.3 C) (09/12 1515) Temp Source: Oral (09/12 1102) BP: 103/86 (09/12 1515) Pulse Rate: 102 (09/12 1515)  Labs: Recent Labs    07/21/2022 1041 07/05/2022 1102 08/01/2022 0058 07/13/2022 0850  HGB 12.9 13.3 11.5*  --   HCT 37.9 39.0 33.0*  --   PLT 234  --  202  --   APTT  --   --  51*  --   LABPROT 23.6*  --  21.1* 21.1*  INR 2.1*  --  1.9* 1.8*  CREATININE 0.98 0.80 1.17*  --     Estimated Creatinine Clearance: 33.6 mL/min (A) (by C-G formula based on SCr of 1.17 mg/dL (H)).  Assessment: Resuming Warfarin post hip surgery today for Afib Bridging with Lovenox starting in the morning - 60 q12 (if needed) Received a small dose of Vitamin K this AM - 5 mg po  INR this PM 1.8  Warfarin dose PTA 5 mg MWF, 2.5 mg AOD  Goal of Therapy:  INR 2-3 Monitor platelets by anticoagulation protocol: Yes   Plan:  Warfarin 5 mg po x 1 dose today Lovenox 60 mg sq Q 12 hours - starting tonight Daily INR  Thank you Anette Guarneri, PharmD 07/13/2022,4:02 PM

## 2022-07-15 NOTE — Progress Notes (Signed)
Left hip fracture.  I discussed with the patient's son and granddaughter the risks and benefits of surgery, including the possibility of infection, nerve injury, vessel injury, wound breakdown, arthritis, symptomatic hardware, DVT/ PE, loss of motion, malunion, nonunion, and need for further surgery among others.  They acknowledged these risks and provided consent to proceed.  Natasha Kaplan, MD [facility], [facility] [phone]

## 2022-07-15 NOTE — Transfer of Care (Signed)
Immediate Anesthesia Transfer of Care Note  Patient: Natasha Livingston  Procedure(s) Performed: INTRAMEDULLARY NAILING OF LEFT FEMUR (Left: Hip)  Patient Location: PACU  Anesthesia Type:General  Level of Consciousness: oriented, drowsy and patient cooperative  Airway & Oxygen Therapy: Patient Spontanous Breathing and Patient connected to face mask oxygen  Post-op Assessment: Report given to RN and Post -op Vital signs reviewed and stable  Post vital signs: Reviewed  Last Vitals:  Vitals Value Taken Time  BP 119/51 07/16/2022 1430  Temp 36.3 C 07/05/2022 1430  Pulse 99 07/23/2022 1436  Resp 22 07/30/2022 1436  SpO2 95 % 07/08/2022 1436  Vitals shown include unvalidated device data.  Last Pain:  Vitals:   07/13/2022 1102  TempSrc: Oral  PainSc: 0-No pain         Complications: No notable events documented.

## 2022-07-15 NOTE — Progress Notes (Addendum)
Pt arrived to Pre-Op unit, no report received from nursing floor 6N.

## 2022-07-15 NOTE — Op Note (Signed)
07/30/2022  3:29 PM  PATIENT:  Natasha Livingston  1934-05-03 female   MEDICAL RECORD NUMBER: [id number]  PRE-OPERATIVE DIAGNOSIS:  Left Basocervical Intertrochanteric Hip Fracture  POST-OPERATIVE DIAGNOSIS:  Left Basocervical Intertrochanteric Hip Fracture  PROCEDURE:  INTRAMEDULLARY NAILING OF THE LEFT HIP using a Biomet Affixus nail short 9 mm statically locked.  SURGEON:  Astrid Divine. Marcelino Scot, M.D.  ASSISTANT:  None.  ANESTHESIA:  General.  COMPLICATIONS:  None.  ESTIMATED BLOOD LOSS:  Less than 100 mL.  DISPOSITION:  To PACU.  CONDITION:  Stable.  DELAY START OF DVT PROPHYLAXIS BECAUSE OF BLEEDING RISK: NO  BRIEF SUMMARY AND INDICATION OF PROCEDURE:  Natasha Livingston is a 88 y.o. year- old with multiple medical problems.  I discussed with the patient and family risks and benefits of surgical treatment including the potential for malunion, nonunion, symptomatic hardware, heart attack, stroke, neurovascular injury, bleeding, and others.  After full discussion, the patient and family wished to proceed.  BRIEF SUMMARY OF PROCEDURE:  The patient was taken to the operating room where general anesthesia was induced.  She was positioned supine on the Hana fracture table.  A closed reduction maneuver was performed of the fractured proximal femur and this was confirmed on both AP and lateral xray views. A thorough scrub and wash with chlorhexidine and then Betadine scrub and paint was performed.  After sterile drapes and time-out, a long instrument was used to identify the appropriate starting position under C-arm on both AP and lateral images.  A 3 cm incision was made proximal to the greater trochanter.  The curved cannulated awl was inserted just medial to the tip of the lateral trochanter and then the starting guidewire advanced into the proximal femur.  This was checked on AP and lateral views.  The starting reamer was engaged with the soft tissue protected by a sleeve.  The curved  ball-tipped guidewire was then inserted, making sure it was just posterior as possible in the distal femur and across the fracture site, which stayed in a reduced position.  It was sequentially reamed up to 11 mm and a short 9 mm nail inserted to the appropriate depth.  The guidewire for the lag screw was then inserted with the appropriate anteversion to make sure it was in a center-center position. In addition I placed a pin to reduce rotation of the head and neck segment. The lag screw pin was measured and the lag screw placed with excellent purchase with position checked on both views.  The set screw was then engaged within the groove of the lag screw, which was allowed to telescope.  After removing the antirotation pin, traction was released and compression achieved with the Compression device.  This was followed by placement of one distal locking screw using the jig confirming position with perfect circle technique on AP and lateral images. Wounds were irrigated thoroughly, closed in a standard layered fashion. Sterile gently compressive dressings were applied. The patient was awakened from anesthesia and transported to the PACU in stable condition.  PROGNOSIS:  The patient will be weightbearing as tolerated with physical therapy resuming coumadin for afib.  She has no range of motion precautions.  We will continue to follow through at the hospital.  Anticipate follow up in the office in 2 weeks for removal of sutures and further evaluation. Rehab will be consulted as she lives alone with plans to return and is a regular ambulator with a cane.     Astrid Divine. Bernabe Dorce,  M.D.

## 2022-07-15 NOTE — Anesthesia Postprocedure Evaluation (Addendum)
Anesthesia Post Note  Patient: Natasha Livingston  Procedure(s) Performed: INTRAMEDULLARY NAILING OF LEFT FEMUR (Left: Hip)     Patient location during evaluation: PACU Anesthesia Type: General Level of consciousness: awake Pain management: pain level controlled Vital Signs Assessment: post-procedure vital signs reviewed and stable Respiratory status: spontaneous breathing, nonlabored ventilation, respiratory function stable and patient connected to nasal cannula oxygen Cardiovascular status: blood pressure returned to baseline and stable Postop Assessment: no apparent nausea or vomiting Anesthetic complications: no   No notable events documented.  Last Vitals:  Vitals:   07/19/2022 1500 07/08/2022 1515  BP: (!) 84/65 103/86  Pulse: 96 (!) 102  Resp: 17 20  Temp:  (!) 36.3 C  SpO2: 96% 100%    Last Pain:  Vitals:   07/18/2022 1102  TempSrc: Oral  PainSc: 0-No pain                 [name]

## 2022-07-15 NOTE — Anesthesia Preprocedure Evaluation (Addendum)
Anesthesia Evaluation  Patient identified by MRN, date of birth, ID band Patient confused    Reviewed: Allergy & Precautions, NPO status , Patient's Chart, lab work & pertinent test results  Airway Mallampati: II  TM Distance: >3 FB Neck ROM: Full    Dental  (+) Dental Advisory Given   Pulmonary    Pulmonary exam normal        Cardiovascular hypertension, Pt. on home beta blockers and Pt. on medications + CAD, + CABG and +CHF  + Valvular Problems/Murmurs MVP  Rhythm:Regular Rate:Normal  Echo: - Left ventricle: Inferobasal and posterior lateral hypokinesis  worse. Wall thickness was increased in a pattern of mild LVH.  Systolic function was moderately reduced. The estimated ejection  fraction was in the range of 35% to 40%. Left ventricular  diastolic function parameters were normal.  - Aortic valve: There was trivial regurgitation.  - Mitral valve: Moderately calcified annulus. Moderately thickened,  moderately calcified leaflets . There was mild regurgitation.  - Left atrium: The atrium was severely dilated.  - Right ventricle: The cavity size was mildly dilated.  - Right atrium: The atrium was severely dilated.  - Atrial septum: No defect or patent foramen ovale was identified.  - Tricuspid valve: There was severe regurgitation.   Neuro/Psych PSYCHIATRIC DISORDERS Depression    GI/Hepatic negative GI ROS, Neg liver ROS,   Endo/Other  Hypothyroidism   Renal/GU Renal disease     Musculoskeletal   Abdominal   Peds  Hematology negative hematology ROS (+)   Anesthesia Other Findings   Reproductive/Obstetrics                            Anesthesia Physical Anesthesia Plan  ASA: 3  Anesthesia Plan: General   Post-op Pain Management:    Induction: Intravenous  PONV Risk Score and Plan: 4 or greater and Ondansetron and Treatment may vary due to age or medical  condition  Airway Management Planned: Oral ETT  Additional Equipment: None  Intra-op Plan:   Post-operative Plan: Extubation in OR  Informed Consent: I have reviewed the patients History and Physical, chart, labs and discussed the procedure including the risks, benefits and alternatives for the proposed anesthesia with the patient or authorized representative who has indicated his/her understanding and acceptance.     Dental advisory given and Consent reviewed with POA  Plan Discussed with: CRNA  Anesthesia Plan Comments:         Anesthesia Quick Evaluation

## 2022-07-15 NOTE — Progress Notes (Addendum)
HD#1 SUBJECTIVE:  Patient Summary:Patient is an 88 year old female with a hx of valvular afib on warfarin, mitral valve prolapse, ischemic heart disease s/p CABG, HTN, who presented with complaints of hip pain.  Overnight Events: NAEO    Interm History:  Patient states she is nervous about surgery today. She does endorse some pain in her hip worse compared to yesterday. No other complaints. Would like for [place] to update son.   OBJECTIVE:  Vital Signs: Vitals:   07/22/2022 0622 07/18/2022 0836 07/29/2022 1102 07/19/2022 1109  BP:  (!) 126/99 123/71   Pulse:  83  (!) 105  Resp:  17 (!) 22   Temp:  97.7 F (36.5 C) 98.1 F (36.7 C)   TempSrc:  Oral Oral   SpO2:  96% 98%   Weight: 63.3 kg     Height:       Supplemental O2:  SpO2: 98 %  Filed Weights   07/20/2022 1037 07/30/2022 0622  Weight: 61.2 kg 63.3 kg     Intake/Output Summary (Last 24 hours) at 07/16/2022 1157 Last data filed at 07/22/2022 0800 Gross per 24 hour  Intake --  Output 350 ml  Net -350 ml   Net IO Since Admission: -350 mL [07/12/2022 1157]  Physical Exam: Constitutional: elderly-appearing female sitting in bed, in no acute distress HENT: left periorbital ecchymosis Cardiovascular: regular rate, irregular rhythm pulsatile neck veins, RV heave Pulmonary: normal work of breathing MSK: moves upper extremities. LLE shortened and externally rotated.  Neurological: alert, conversational. Responds appropriately. Moves upper extremities. Moves RLE, able ot move toes of LLE.  Skin: warm and dry Psych: pleasant, mood and affect appropriate  Patient Lines/Drains/Airways Status     Active Line/Drains/Airways     Name Placement date Placement time Site Days   Peripheral IV 07/18/2022 20 G Right Antecubital 07/23/2022  2038  Antecubital  1   External Urinary Catheter 07/24/2022  0332  --  less than 1            Pertinent Labs:    Latest Ref Rng & Units 07/07/2022   12:58 AM 07/09/2022   11:02 AM 07/19/2022   10:41  AM  CBC  WBC 4.0 - 10.5 K/uL 12.2   9.3   Hemoglobin 12.0 - 15.0 g/dL 11.5  13.3  12.9   Hematocrit 36.0 - 46.0 % 33.0  39.0  37.9   Platelets 150 - 400 K/uL 202   234        Latest Ref Rng & Units 07/04/2022   12:58 AM 07/30/2022   11:02 AM 07/30/2022   10:41 AM  CMP  Glucose 70 - 99 mg/dL 212  211  210   BUN 8 - 23 mg/dL '22  22  22   '$ Creatinine 0.44 - 1.00 mg/dL 1.17  0.80  0.98   Sodium 135 - 145 mmol/L 134  136  136   Potassium 3.5 - 5.1 mmol/L 4.6  3.7  3.8   Chloride 98 - 111 mmol/L 99  101  99   CO2 22 - 32 mmol/L 25   23   Calcium 8.9 - 10.3 mg/dL 9.5   9.6   Total Protein 6.5 - 8.1 g/dL   6.7   Total Bilirubin 0.3 - 1.2 mg/dL   1.4   Alkaline Phos 38 - 126 U/L   152   AST 15 - 41 U/L   26   ALT 0 - 44 U/L   34  Recent Labs    07/04/2022 1116  GLUCAP 219*     Pertinent Imaging: CT MAXILLOFACIAL WO CONTRAST  Result Date: 07/21/2022 CLINICAL DATA:  Fall with bruising under the right eye EXAM: CT MAXILLOFACIAL WITHOUT CONTRAST TECHNIQUE: Multidetector CT imaging of the maxillofacial structures was performed. Multiplanar CT image reconstructions were also generated. RADIATION DOSE REDUCTION: This exam was performed according to the departmental dose-optimization program which includes automated exposure control, adjustment of the mA and/or kV according to patient size and/or use of iterative reconstruction technique. COMPARISON:  Head CT 07/09/2022 FINDINGS: Osseous: Mastoid air cells are clear. Mandibular heads are normally position. No mandibular fracture. Pterygoid plates and zygomatic arches are intact. No acute nasal bone fracture Orbits: Acute minimally displaced left orbital floor fracture. No displacement of intra-ocular contents. Sinuses: Moderate left maxillary hemosinus. Soft tissues: Mild left periorbital soft tissue swelling Limited intracranial: See separately dictated head CT IMPRESSION: Acute minimally displaced left orbital floor fracture with moderate left  maxillary hemosinus Electronically Signed   By: Donavan Foil M.D.   On: 07/28/2022 19:42   DG Knee Complete 4 Views Left  Result Date: 07/25/2022 CLINICAL DATA:  Fall with lateral knee pain and LEFT hip pain. EXAM: LEFT KNEE - COMPLETE 4+ VIEW COMPARISON:  Femoral evaluation of the same date. FINDINGS: Mild tricompartmental degenerative changes about the knee. No sign of joint effusion. Moderate chondrocalcinosis. No acute fracture or dislocation about the LEFT the. Soft tissues are unremarkable. IMPRESSION: 1. No acute fracture or dislocation. Mild tricompartmental degenerative changes. 2. Moderate chondrocalcinosis. Electronically Signed   By: Zetta Bills M.D.   On: 07/23/2022 14:58   CT CHEST ABDOMEN PELVIS W CONTRAST  Result Date: 07/16/2022 CLINICAL DATA:  An 88 year old female presents following mechanical fall for assessment of trauma. EXAM: CT CHEST, ABDOMEN, AND PELVIS WITH CONTRAST TECHNIQUE: Multidetector CT imaging of the chest, abdomen and pelvis was performed following the standard protocol during bolus administration of intravenous contrast. RADIATION DOSE REDUCTION: This exam was performed according to the departmental dose-optimization program which includes automated exposure control, adjustment of the mA and/or kV according to patient size and/or use of iterative reconstruction technique. CONTRAST:  134m OMNIPAQUE IOHEXOL 300 MG/ML  SOLN COMPARISON:  April of 2022 CT of the chest. FINDINGS: CT CHEST FINDINGS Cardiovascular: Marked cardiomegaly with signs of median sternotomy for CABG. LEFT atrial and RIGHT heart enlargement. No pericardial effusion. Central pulmonary vasculature is of normal caliber and is unremarkable on venous phase. Aortic atherosclerosis both calcified and noncalcified of the thoracic aorta. No signs of dilation or aortic injury. Three-vessel branching pattern in the chest. Mediastinum/Nodes: No hematoma in the mediastinum. No adenopathy in the chest.  Lungs/Pleura: Basilar atelectasis, stable mild subpleural reticulation at the lung bases. Stable benign pulmonary nodule in the RIGHT lung base (image 101/5) 3 mm. Airways are patent. No pneumothorax. No pleural effusion. Musculoskeletal: See below for full musculoskeletal details. CT ABDOMEN PELVIS FINDINGS Hepatobiliary: Reflux of contrast into the hepatic veins. Lobular hepatic contours. No focal, suspicious hepatic lesion or signs of hepatic trauma. Post cholecystectomy without biliary duct distension. Portal vein is patent. Pancreas: Pancreatic atrophy without signs of inflammation. No ductal dilation. No signs of trauma to the pancreas. Spleen: Smooth contours without signs of trauma or focal, suspicious lesion. Normal size. Adrenals/Urinary Tract: Adrenal glands are normal. Symmetric renal enhancement without signs of hydronephrosis or perinephric stranding. Signs of renal trauma. No signs of renal trauma. Urinary bladder with smooth contours. No suspicious renal lesion or Stomach/Bowel: Colonic diverticulosis in  sigmoid diverticular disease. No acute gastrointestinal process. Appendix not visible but no secondary signs to suggest acute appendiceal process. Vascular/Lymphatic: Smooth aortic contour with normal caliber and extensive calcified and noncalcified aortic atherosclerotic plaque. No signs of adenopathy in the abdomen or in the pelvis. Reproductive: Post hysterectomy. Other: No sign of free air.  No evidence of pelvic ascites. Musculoskeletal: Visualized clavicles and scapulae are intact. No displaced rib fractures. Sternum with median sternotomy changes. Costochondral elements are intact. No body wall contusion aside from contusion overlying the LEFT gluteal region and hip. Spinal degenerative changes without signs of spinal malalignment. Comminuted fracture of the intertrochanteric LEFT proximal femur. Femoral head is located within the acetabulum. Intramuscular hematoma of gluteal musculature on  the LEFT is small. Bilateral SI joints are symmetric.  Symphysis pubis is intact. IMPRESSION: 1. Comminuted fracture of the intertrochanteric LEFT proximal femur. Associated with marked comminution and Verus angulation. 2. Intramuscular hematoma of the LEFT gluteal musculature is small. 3. Marked cardiomegaly with signs of median sternotomy for CABG. 4. Lobular hepatic contours, correlate with any clinical or laboratory evidence of liver disease. Patient is at risk for cardiogenic liver disease based on the appearance of the heart and signs of RIGHT heart dysfunction described above with reflux of contrast into hepatic veins. 5. Colonic diverticulosis without evidence of acute gastrointestinal process. 6. Aortic atherosclerosis. Aortic Atherosclerosis (ICD10-I70.0). Electronically Signed   By: Zetta Bills M.D.   On: 07/21/2022 13:22   CT HEAD WO CONTRAST  Result Date: 07/13/2022 CLINICAL DATA:  Mechanical fall EXAM: CT HEAD WITHOUT CONTRAST TECHNIQUE: Contiguous axial images were obtained from the base of the skull through the vertex without intravenous contrast. RADIATION DOSE REDUCTION: This exam was performed according to the departmental dose-optimization program which includes automated exposure control, adjustment of the mA and/or kV according to patient size and/or use of iterative reconstruction technique. COMPARISON:  None Available. FINDINGS: Brain: No evidence of acute infarction, hemorrhage, hydrocephalus, extra-axial collection or mass lesion/mass effect. Vascular: No hyperdense vessel or unexpected calcification. Skull: No acute fracture or focal lesion. Of note, there is motion artifact at the skull base, which slightly limits evaluation. Sinuses/Orbits: Partial opacification of the left maxillary sinus. Other: None. IMPRESSION: 1. No definite acute intracranial abnormality. 2. Partial opacification of the left maxillary sinus, which may be related to chronic sinusitis. However, a subtle  nondisplaced sinus fracture and resulting blood products is not excluded. If there is persistent clinical concern, consider further assessment with CT facial bone. Electronically Signed   By: Beryle Flock M.D.   On: 07/09/2022 13:09   CT CERVICAL SPINE WO CONTRAST  Result Date: 07/30/2022 CLINICAL DATA:  Polytrauma, blunt. EXAM: CT CERVICAL SPINE WITHOUT CONTRAST TECHNIQUE: Multidetector CT imaging of the cervical spine was performed without intravenous contrast. Multiplanar CT image reconstructions were also generated. RADIATION DOSE REDUCTION: This exam was performed according to the departmental dose-optimization program which includes automated exposure control, adjustment of the mA and/or kV according to patient size and/or use of iterative reconstruction technique. COMPARISON:  None Available. FINDINGS: Alignment: Straightening/mild reversal of the normal cervical lordosis. Mild right convex curvature of the cervical spine. Minimal anterolisthesis of C3 on C4, C4 on C5, and C7 on T1, likely degenerative. Skull base and vertebrae: No acute fracture or suspicious osseous lesion. Moderate C1-2 arthropathy with noncompressive ligamentous thickening and calcification posterior to the dens. Soft tissues and spinal canal: No prevertebral fluid or swelling. No visible canal hematoma. Disc levels: Advanced disc degeneration at C5-6 and C6-7. Widespread  cervical and upper thoracic facet arthrosis. Multilevel neural foraminal stenosis due to uncovertebral and facet spurring, moderate on the right at C6-7. No evidence of high-grade spinal canal stenosis. Upper chest: More fully evaluated on separate chest CT. Other: Carotid atherosclerosis. IMPRESSION: 1. No acute cervical spine fracture. 2. Advanced multilevel disc and facet degeneration. Electronically Signed   By: Logan Bores M.D.   On: 07/11/2022 13:08    ASSESSMENT/PLAN:  Assessment: Principal Problem:   Hip fracture (HCC) Active Problems:   Atrial  fibrillation ([place])   Hypertension   Aortic stenosis   Ischemic heart disease   Patient is an 88 year old female with a hx of valvular afib on warfarin, mitral valve prolapse, ischemic heart disease s/p CABG, HTN, admitted for femoral fracture sustained after GLF.  # Comminuted left intertrochanteric proximal femur fracture # Osteopenia # Frailty  Sustained after ground level fall. Small intramuscular hematoma noted on CT. High risk finding with significant mortality association.  Pain partially controlled by nerve block performed by anesthesia in ED.  Received dose of VitK this AM for INR 1.9. Minimal improvement to 1.8. Undergoing IMN with ortho today.   - pain control with tylenol, toradol, and oxycodone. Consider dilaudid if pain not well controlled. - PT/OT as able - monitor vital signs.  - daily CBC, Consider repeat pelvis CT if hypotensive or significant drop in hgb - anticipate SNF placement, TOC consulted. - consider bisphosphonate as outpatient.    # Left orbital floor fracture Sustained during fall. Moderate left maxillary hemosinus noted. Vision intact, EOM intact. No pain with eye movement. No active epistaxis. No concern for acute orbit/orbital compartment syndrome at this time.  Likely nothing to do for this, however, can be followed up as outpatient as necessary.    # Valvular afib on warfarin # MR caused by MVP INR goal 2-3. CHADSVASC score 6.  Received '5mg'$  oral VitK this morning with improvement. Most recent INR 1.8.  - Lovenox bridge back to warfarin after surgery with pharmacy assistance  - cardiac monitoring   # HFrEF 35-40% 2019 # HTN # CAD s/p CABG 2008 Does not appear to be volume overloaded at this time.  - daily cbc - continue home metoprolol and entresto - can restart Lasix once patient is taking PO again   # Hx gout - allopurinol   # hypothyroidism - home synthroid   # Memory problems There is a hx of possible dementia. Per son, baseline is  conversant, alert, but oriented only to self. Has help at home. Able to do ADLs.  Appears to be at baseline. Does have hx hospital delirium - delirium precautions.  - Son to stay the night to assist with re-orienting   Diet: NPO VTE: None SCDs IVF: None,None Code: Full Prior to Admission Living Arrangement: Home, living with help Anticipated Discharge Location: SNF Barriers to Discharge: medical stability Dispo: Admit patient to Inpatient with expected length of stay greater than 2 midnights.   Signed: Delene Ruffini, MD

## 2022-07-15 NOTE — Progress Notes (Signed)
Dr. Smith Robert from anesthesia made aware of the pt's elevated blood sugars x 3 days. Blood sugar upon arrival to Pre-Op 219. Per Dr. Smith Robert, do not treat, we will monitor the blood sugars during the procedure.

## 2022-07-15 NOTE — Anesthesia Procedure Notes (Addendum)
Procedure Name: Intubation Date/Time: 07/27/2022 12:34 PM  Performed by: Jenne Campus, CRNAPre-anesthesia Checklist: Patient identified, Emergency Drugs available, Suction available and Patient being monitored Patient Re-evaluated:Patient Re-evaluated prior to induction Oxygen Delivery Method: Circle System Utilized Preoxygenation: Pre-oxygenation with 100% oxygen Induction Type: IV induction Ventilation: Mask ventilation without difficulty Laryngoscope Size: Mac and 3 Grade View: Grade I Tube type: Oral Number of attempts: 1 Airway Equipment and Method: Stylet Placement Confirmation: ETT inserted through vocal cords under direct vision, positive ETCO2 and breath sounds checked- equal and bilateral Secured at: 21 cm Tube secured with: Tape Dental Injury: Teeth and Oropharynx as per pre-operative assessment

## 2022-07-16 ENCOUNTER — Encounter (HOSPITAL_COMMUNITY): Payer: Self-pay | Admitting: Orthopedic Surgery

## 2022-07-16 ENCOUNTER — Inpatient Hospital Stay (HOSPITAL_COMMUNITY): Payer: Medicare Other

## 2022-07-16 ENCOUNTER — Ambulatory Visit: Payer: Medicare Other

## 2022-07-16 DIAGNOSIS — M85852 Other specified disorders of bone density and structure, left thigh: Secondary | ICD-10-CM | POA: Diagnosis not present

## 2022-07-16 DIAGNOSIS — S0232XA Fracture of orbital floor, left side, initial encounter for closed fracture: Secondary | ICD-10-CM | POA: Diagnosis not present

## 2022-07-16 DIAGNOSIS — S72002A Fracture of unspecified part of neck of left femur, initial encounter for closed fracture: Secondary | ICD-10-CM

## 2022-07-16 DIAGNOSIS — R570 Cardiogenic shock: Secondary | ICD-10-CM

## 2022-07-16 DIAGNOSIS — I4891 Unspecified atrial fibrillation: Secondary | ICD-10-CM

## 2022-07-16 DIAGNOSIS — R54 Age-related physical debility: Secondary | ICD-10-CM | POA: Diagnosis not present

## 2022-07-16 DIAGNOSIS — N179 Acute kidney failure, unspecified: Secondary | ICD-10-CM | POA: Diagnosis not present

## 2022-07-16 DIAGNOSIS — S72142A Displaced intertrochanteric fracture of left femur, initial encounter for closed fracture: Secondary | ICD-10-CM | POA: Diagnosis not present

## 2022-07-16 DIAGNOSIS — R579 Shock, unspecified: Secondary | ICD-10-CM

## 2022-07-16 DIAGNOSIS — E875 Hyperkalemia: Secondary | ICD-10-CM | POA: Diagnosis present

## 2022-07-16 LAB — CBC
HCT: 34.5 % — ABNORMAL LOW (ref 36.0–46.0)
HCT: 33.8 % — ABNORMAL LOW (ref 36.0–46.0)
HCT: 34.9 % — ABNORMAL LOW (ref 36.0–46.0)
Hemoglobin: 11.2 g/dL — ABNORMAL LOW (ref 12.0–15.0)
Hemoglobin: 10.7 g/dL — ABNORMAL LOW (ref 12.0–15.0)
Hemoglobin: 11.1 g/dL — ABNORMAL LOW (ref 12.0–15.0)
MCH: 34.7 pg — ABNORMAL HIGH (ref 26.0–34.0)
MCH: 35 pg — ABNORMAL HIGH (ref 26.0–34.0)
MCH: 35.6 pg — ABNORMAL HIGH (ref 26.0–34.0)
MCHC: 32.5 g/dL (ref 30.0–36.0)
MCHC: 31.7 g/dL (ref 30.0–36.0)
MCHC: 31.8 g/dL (ref 30.0–36.0)
MCV: 106.8 fL — ABNORMAL HIGH (ref 80.0–100.0)
MCV: 110.5 fL — ABNORMAL HIGH (ref 80.0–100.0)
MCV: 111.9 fL — ABNORMAL HIGH (ref 80.0–100.0)
Platelets: 201 10*3/uL (ref 150–400)
Platelets: 215 10*3/uL (ref 150–400)
Platelets: 206 10*3/uL (ref 150–400)
RBC: 3.23 MIL/uL — ABNORMAL LOW (ref 3.87–5.11)
RBC: 3.06 MIL/uL — ABNORMAL LOW (ref 3.87–5.11)
RBC: 3.12 MIL/uL — ABNORMAL LOW (ref 3.87–5.11)
RDW: 16 % — ABNORMAL HIGH (ref 11.5–15.5)
RDW: 16.4 % — ABNORMAL HIGH (ref 11.5–15.5)
RDW: 16.5 % — ABNORMAL HIGH (ref 11.5–15.5)
WBC: 17 10*3/uL — ABNORMAL HIGH (ref 4.0–10.5)
WBC: 16.3 10*3/uL — ABNORMAL HIGH (ref 4.0–10.5)
WBC: 19.6 10*3/uL — ABNORMAL HIGH (ref 4.0–10.5)
nRBC: 0.5 % — ABNORMAL HIGH (ref 0.0–0.2)
nRBC: 0.9 % — ABNORMAL HIGH (ref 0.0–0.2)
nRBC: 1.1 % — ABNORMAL HIGH (ref 0.0–0.2)

## 2022-07-16 LAB — GLUCOSE, CAPILLARY
Glucose-Capillary: 111 mg/dL — ABNORMAL HIGH (ref 70–99)
Glucose-Capillary: 150 mg/dL — ABNORMAL HIGH (ref 70–99)
Glucose-Capillary: 19 mg/dL — CL (ref 70–99)
Glucose-Capillary: 26 mg/dL — CL (ref 70–99)
Glucose-Capillary: 32 mg/dL — CL (ref 70–99)
Glucose-Capillary: 99 mg/dL (ref 70–99)

## 2022-07-16 LAB — BASIC METABOLIC PANEL
Anion gap: 20 — ABNORMAL HIGH (ref 5–15)
Anion gap: 25 — ABNORMAL HIGH (ref 5–15)
Anion gap: 28 — ABNORMAL HIGH (ref 5–15)
BUN: 41 mg/dL — ABNORMAL HIGH (ref 8–23)
BUN: 43 mg/dL — ABNORMAL HIGH (ref 8–23)
BUN: 43 mg/dL — ABNORMAL HIGH (ref 8–23)
CO2: 15 mmol/L — ABNORMAL LOW (ref 22–32)
CO2: 11 mmol/L — ABNORMAL LOW (ref 22–32)
CO2: 9 mmol/L — ABNORMAL LOW (ref 22–32)
Calcium: 9.7 mg/dL (ref 8.9–10.3)
Calcium: 9.7 mg/dL (ref 8.9–10.3)
Calcium: 10 mg/dL (ref 8.9–10.3)
Chloride: 100 mmol/L (ref 98–111)
Chloride: 99 mmol/L (ref 98–111)
Chloride: 100 mmol/L (ref 98–111)
Creatinine, Ser: 2.49 mg/dL — ABNORMAL HIGH (ref 0.44–1.00)
Creatinine, Ser: 3.17 mg/dL — ABNORMAL HIGH (ref 0.44–1.00)
Creatinine, Ser: 2.91 mg/dL — ABNORMAL HIGH (ref 0.44–1.00)
GFR, Estimated: 18 mL/min — ABNORMAL LOW (ref >60)
GFR, Estimated: 14 mL/min — ABNORMAL LOW (ref >60)
GFR, Estimated: 15 mL/min — ABNORMAL LOW (ref >60)
Glucose, Bld: 164 mg/dL — ABNORMAL HIGH (ref 70–99)
Glucose, Bld: 110 mg/dL — ABNORMAL HIGH (ref 70–99)
Glucose, Bld: 61 mg/dL — ABNORMAL LOW (ref 70–99)
Potassium: 5.7 mmol/L — ABNORMAL HIGH (ref 3.5–5.1)
Potassium: 5.7 mmol/L — ABNORMAL HIGH (ref 3.5–5.1)
Potassium: 5.9 mmol/L — ABNORMAL HIGH (ref 3.5–5.1)
Sodium: 135 mmol/L (ref 135–145)
Sodium: 135 mmol/L (ref 135–145)
Sodium: 137 mmol/L (ref 135–145)

## 2022-07-16 LAB — PROTIME-INR
INR: 2.9 — ABNORMAL HIGH (ref 0.8–1.2)
INR: 3.6 — ABNORMAL HIGH (ref 0.8–1.2)
Prothrombin Time: 30 seconds — ABNORMAL HIGH (ref 11.4–15.2)
Prothrombin Time: 35.7 seconds — ABNORMAL HIGH (ref 11.4–15.2)

## 2022-07-16 LAB — PREPARE RBC (CROSSMATCH)

## 2022-07-16 LAB — OCCULT BLOOD GASTRIC / DUODENUM (SPECIMEN CUP): Occult Blood, Gastric: POSITIVE — AB

## 2022-07-16 LAB — TROPONIN I (HIGH SENSITIVITY): Troponin I (High Sensitivity): 329 ng/L (ref <18)

## 2022-07-16 LAB — LACTIC ACID, PLASMA: Lactic Acid, Venous: >9 mmol/L (ref 0.5–1.9)

## 2022-07-16 MED ORDER — NOREPINEPHRINE 4 MG/250ML-% IV SOLN
2.0000 ug/min | INTRAVENOUS | Status: DC
  Administered 2022-07-16: 2 ug/min via INTRAVENOUS
  Administered 2022-07-17: 24 ug/min via INTRAVENOUS
  Administered 2022-07-17: 15 ug/min via INTRAVENOUS
  Filled 2022-07-16 (×3): qty 250

## 2022-07-16 MED ORDER — ZINC SULFATE 220 (50 ZN) MG PO CAPS
220.0000 mg | ORAL_CAPSULE | Freq: Every day | ORAL | Status: DC
  Filled 2022-07-16: qty 1

## 2022-07-16 MED ORDER — VITAMIN K1 10 MG/ML IJ SOLN
5.0000 mg | Freq: Once | INTRAVENOUS | Status: AC
  Administered 2022-07-16: 5 mg via INTRAVENOUS
  Filled 2022-07-16: qty 0.5

## 2022-07-16 MED ORDER — LACTATED RINGERS IV BOLUS: 1000.0000 mL | Freq: Once | INTRAVENOUS | Status: DC

## 2022-07-16 MED ORDER — SODIUM CHLORIDE 0.9% IV SOLUTION: Freq: Once | INTRAVENOUS | Status: DC

## 2022-07-16 MED ORDER — LACTATED RINGERS IV BOLUS
1000.0000 mL | Freq: Once | INTRAVENOUS | Status: AC
  Administered 2022-07-16: 1000 mL via INTRAVENOUS

## 2022-07-16 MED ORDER — FUROSEMIDE 10 MG/ML IJ SOLN
120.0000 mg | Freq: Once | INTRAVENOUS | Status: DC
  Filled 2022-07-16 (×2): qty 12

## 2022-07-16 MED ORDER — HYDROMORPHONE HCL 1 MG/ML IJ SOLN
0.5000 mg | INTRAMUSCULAR | Status: DC | PRN
  Filled 2022-07-16: qty 0.5

## 2022-07-16 MED ORDER — LACTATED RINGERS IV BOLUS
500.0000 mL | Freq: Once | INTRAVENOUS | Status: AC
  Administered 2022-07-16: 500 mL via INTRAVENOUS

## 2022-07-16 MED ORDER — DEXTROSE 50 % IV SOLN
INTRAVENOUS | Status: AC
  Administered 2022-07-16: 50 mL
  Filled 2022-07-16: qty 50

## 2022-07-16 MED ORDER — CHLORHEXIDINE GLUCONATE CLOTH 2 % EX PADS
6.0000 | MEDICATED_PAD | Freq: Every day | CUTANEOUS | Status: DC
  Administered 2022-07-16: 6 via TOPICAL

## 2022-07-16 MED ORDER — VITAMIN C 500 MG PO TABS
1000.0000 mg | ORAL_TABLET | Freq: Every day | ORAL | Status: DC
  Administered 2022-07-16: 1000 mg via ORAL
  Filled 2022-07-16: qty 2

## 2022-07-16 MED ORDER — PANTOPRAZOLE SODIUM 40 MG IV SOLR
40.0000 mg | Freq: Two times a day (BID) | INTRAVENOUS | Status: DC
  Administered 2022-07-16 (×2): 40 mg via INTRAVENOUS
  Filled 2022-07-16 (×2): qty 10

## 2022-07-16 MED ORDER — ONDANSETRON HCL 4 MG PO TABS: 4.0000 mg | ORAL_TABLET | Freq: Three times a day (TID) | ORAL | Status: DC | PRN

## 2022-07-16 MED ORDER — LACTATED RINGERS IV BOLUS: 500.0000 mL | Freq: Once | INTRAVENOUS | Status: DC

## 2022-07-16 MED ORDER — ONDANSETRON HCL 4 MG/2ML IJ SOLN: 4.0000 mg | Freq: Three times a day (TID) | INTRAMUSCULAR | Status: DC | PRN

## 2022-07-16 MED ORDER — LACTATED RINGERS IV SOLN: INTRAVENOUS | Status: AC

## 2022-07-16 MED ORDER — LACTATED RINGERS IV BOLUS
250.0000 mL | Freq: Once | INTRAVENOUS | Status: AC
  Administered 2022-07-16: 250 mL via INTRAVENOUS

## 2022-07-16 MED ORDER — VITAMIN D 25 MCG (1000 UNIT) PO TABS
2000.0000 [IU] | ORAL_TABLET | Freq: Two times a day (BID) | ORAL | Status: DC
  Administered 2022-07-16: 2000 [IU] via ORAL
  Filled 2022-07-16: qty 2

## 2022-07-16 MED ORDER — SODIUM CHLORIDE 0.9% IV SOLUTION: Freq: Once | INTRAVENOUS | Status: AC

## 2022-07-16 MED ORDER — CALCIUM GLUCONATE-NACL 2-0.675 GM/100ML-% IV SOLN
2.0000 g | Freq: Once | INTRAVENOUS | Status: AC
  Administered 2022-07-17: 2000 mg via INTRAVENOUS
  Filled 2022-07-16 (×2): qty 100

## 2022-07-16 MED ORDER — SODIUM CHLORIDE 0.9 % IV SOLN
250.0000 mL | INTRAVENOUS | Status: DC
  Administered 2022-07-17: 250 mL via INTRAVENOUS

## 2022-07-16 MED ORDER — SODIUM ZIRCONIUM CYCLOSILICATE 5 G PO PACK
5.0000 g | PACK | Freq: Once | ORAL | Status: AC
  Administered 2022-07-16: 5 g via ORAL
  Filled 2022-07-16: qty 1

## 2022-07-16 NOTE — Progress Notes (Signed)
Initial Nutrition Assessment  DOCUMENTATION CODES:   Not applicable  INTERVENTION:   -RD will follow for diet advancement and add supplements as appropriate  NUTRITION DIAGNOSIS:   Inadequate oral intake related to inability to eat as evidenced by NPO status.  GOAL:   Patient will meet greater than or equal to 90% of their needs  MONITOR:   Diet advancement  REASON FOR ASSESSMENT:   Consult Assessment of nutrition requirement/status  ASSESSMENT:   Pt with valvular atrial fibrillation anticoagulated on VKA, chronic heart failure with reduced ejection fraction, all complicated by frailty and advanced age, admitted for management after a fall at home resulting in a left femoral neck fracture  Pt admitted with lt hip fracture.   9/12- s/p INTRAMEDULLARY NAILING OF THE LEFT HIP using a [employer] nail short 9 mm statically locked.    Pt unavailable at time of visit. Attempted to speak with pt via call to hospital room phone, however, unable to reach. RD unable to obtain further nutrition-related history or complete nutrition-focused physical exam at this time.    Pt had emesis this morning. Pt is more lethargic and did not eat breakfast. Per MD notes, pt with hematemesis and renal injuries. May need NGT placement if vomiting continues.   Per MD notes, pt family desires full scope care.   Reviewed wt hx; pt wt has been stable over the past 3 months.  Medications reviewed and include vitamin C, colace, zinc sulfate, and lactated ringers infusion @ 100 ml/hr.   Labs reviewed: K: 5.7, CBGS: 150-191.   Diet Order:   Diet Order     None       EDUCATION NEEDS:   No education needs have been identified at this time  Skin:  Skin Assessment: Skin Integrity Issues: Skin Integrity Issues:: Incisions Incisions: closed lt thigh  Last BM:  07/12/2022  Height:   Ht Readings from Last 1 Encounters:  07/18/2022 '5\' 7"'$  (1.702 m)    Weight:   Wt Readings from Last 1  Encounters:  07/16/22 65 kg    Ideal Body Weight:  61.4 kg  BMI:  Body mass index is 22.44 kg/m.  Estimated Nutritional Needs:   Kcal:  1650-1850  Protein:  85-100 grams  Fluid:  > 1.6 L    Loistine Chance, RD, LDN, [place] Registered Dietitian II Certified Diabetes Care and Education Specialist Please refer to [facility] for RD and/or RD on-call/weekend/after hours pager

## 2022-07-16 NOTE — Progress Notes (Signed)
Orthopaedic Trauma Service Progress Note  Patient ID: Natasha Livingston MRN: [id number] DOB/AGE: May 16, 1934 88 y.o.  Subjective:  Nursing reports pt a little more lethargic this am  + emesis this am   Did not eat breakfast  Still waiting for am labs   Has not had any narcotics since surgery    ROS As above  Objective:   VITALS:   Vitals:   07/21/2022 2101 07/12/2022 2110 07/16/22 0100 07/16/22 0528  BP: (!) 81/53   109/77  Pulse: (!) 54   (!) 103  Resp:      Temp:    (!) 97.5 F (36.4 C)  TempSrc:    Oral  SpO2:  92%  100%  Weight:   65 kg   Height:        Estimated body mass index is 22.44 kg/m as calculated from the following:   Height as of this encounter: '5\' 7"'$  (1.702 m).   Weight as of this encounter: 65 kg.   Intake/Output      09/12 0701 09/13 0700 09/13 0701 09/14 0700   P.O. 100    I.V. (mL/kg) 600 (9.2)    IV Piggyback 550    Total Intake(mL/kg) 1250 (19.2)    Urine (mL/kg/hr) 250 (0.2)    Blood 100    Total Output 350    Net +900         Urine Occurrence 3 x    Emesis Occurrence 3 x      LABS  Results for orders placed or performed during the hospital encounter of 07/13/2022 (from the past 24 hour(s))  Glucose, capillary     Status: Abnormal   Collection Time: 07/28/2022 11:16 AM  Result Value Ref Range   Glucose-Capillary 219 (H) 70 - 99 mg/dL  Glucose, capillary     Status: Abnormal   Collection Time: 07/07/2022  1:10 PM  Result Value Ref Range   Glucose-Capillary 238 (H) 70 - 99 mg/dL  Glucose, capillary     Status: Abnormal   Collection Time: 07/09/2022  2:30 PM  Result Value Ref Range   Glucose-Capillary 191 (H) 70 - 99 mg/dL     PHYSICAL EXAM:   Gen: in bed, tired appearing, NAD, answers questions albeit slowly  Lungs: unlabored Ext:       Left Lower Extremity   Dressings clean, dry and intact to L hip   Ext warm   + DP pulse  No DCT  Motor and sensory  functions intact     Assessment/Plan: 1 Day Post-Op    Anti-infectives (From admission, onward)    Start     Dose/Rate Route Frequency Ordered Stop   07/12/2022 1800  ceFAZolin (ANCEF) IVPB 2g/100 mL premix        2 g 200 mL/hr over 30 Minutes Intravenous Every 6 hours 08/01/2022 1603 07/16/22 0133   07/29/2022 1100  ceFAZolin (ANCEF) IVPB 2g/100 mL premix        2 g 200 mL/hr over 30 Minutes Intravenous On call to O.R. 07/07/2022 1057 08/01/2022 1306   07/28/2022 1044  ceFAZolin (ANCEF) 2-4 GM/100ML-% IVPB       Note to Pharmacy: Bobbie Stack M: cabinet override      08/01/2022 1044 07/27/2022 1730     .  POD/HD#: 62  88 year old female ground-level  fall with left hip fracture  -fall  -Displaced left intertrochanteric hip fracture s/p intramedullary nailing  Weight-bear as tolerated with walker with assistance  No range of motion restrictions left hip and knee  Dressing changes as needed starting on 2022-08-02  Ice and elevate  PT/OT evals    - Pain management:  Multimodal  - ABL anemia/Hemodynamics  Cbc pending  - Medical issues   Per primary   - DVT/PE prophylaxis:  Lovenox bridge to Coumadin    On Coumadin chronically for A-fib - ID:   Periop abx  - Metabolic Bone Disease:  Fracture is a fragility fracture which is indicative of osteoporosis  Check vitamin D level - Activity:  As above  - Impediments to fracture healing:  Osteoporosis/poor bone quality  Thyroid disease  Chronic Lasix  - Dispo:  Therapy evaluations  Sutures out around 07/29/2022    Jari Pigg, PA-C [phone] (C) 07/16/2022, 9:08 AM  [facility] [place] [phone] Jenetta [phone] (F)    After 5pm and on the weekends please log on to Amion, go to orthopaedics and the look under the [facility] Call for the provider(s) on call. You can also call our office at [phone] and then follow the prompts to be connected to the  call team.   Patient ID: Natasha Livingston, female   DOB: 01/03/1934, 88 y.o.   MRN: [id number]

## 2022-07-16 NOTE — Significant Event (Addendum)
Rapid Response Event Note   Reason for Call :  Decreased LOC   Initial Focused Assessment:  She is lethargic and pale not answering questions.  Her skin color is yellow/green tinged.  She is warm and dry.   Lung sounds are decreased bases Heart tones irregular (AF) Abdomen is soft/non tender. No IV access  BP 88/69  HR 118 RR 20  unable to obtain O2 sats.  Interventions:  With poor skin color and MS and unable to obtain O2 sats:  Placed on NRB  Left wrist/thumb PIV placed (22ga) 250 LR bolus.  Pt's mental status and skin color improved.  She was talkative with family.  O x 2 BP improved to 108/73, still unable to obtain O2 sats (attempted multiple sites)  BP 86/54  HR 113 30 minutes  after bolus Additional 250cc LR bolus  Dr Elliot Gurney at bedside: spoke with family regarding code status.   Plan of Care:  RN to call if patient becomes hypotensive again  FU:  1900  BP 80s-100s automatic, manual sBP 85.  Patient's MS is worse.  Asked Resident team to see patient and reevaluate. Lab has recently drawn additional CBC/BMET/PT/troponin, awaiting results  Event Summary:   MD Notified: Elliot Gurney came to bedside Call Time: [place] Time: [place] Time: [place]  Raliegh Ip, RN

## 2022-07-16 NOTE — Progress Notes (Signed)
ANTICOAGULATION CONSULT NOTE - Follow Up Consult  Pharmacy Consult for Lovenox / Warfarin Indication: atrial fibrillation  Allergies  Allergen Reactions   Crestor [Rosuvastatin Calcium] Other (See Comments)    Unknown reaction    Lipitor [Atorvastatin Calcium] Swelling   Pacerone [Amiodarone] Other (See Comments)    Unknown reaction     Patient Measurements: Height: '5\' 7"'$  (170.2 cm) Weight: 65 kg (143 lb 4.8 oz) IBW/kg (Calculated) : 61.6  Vital Signs: Temp: 97.5 F (36.4 C) (09/13 0528) Temp Source: Oral (09/13 0528) BP: 101/77 (09/13 0958) Pulse Rate: 106 (09/13 0958)  Labs: Recent Labs    08/02/2022 1041 07/05/2022 1102 07/29/2022 0058 07/09/2022 0850 07/16/22 0937  HGB 12.9 13.3 11.5*  --  11.2*  HCT 37.9 39.0 33.0*  --  34.5*  PLT 234  --  202  --  201  APTT  --   --  51*  --   --   LABPROT 23.6*  --  21.1* 21.1* 30.0*  INR 2.1*  --  1.9* 1.8* 2.9*  CREATININE 0.98 0.80 1.17*  --  2.49*     Estimated Creatinine Clearance: 15.8 mL/min (A) (by C-G formula based on SCr of 2.49 mg/dL (H)).  Assessment: Patient with iron-colored emesis this morning. Discussed with provider. Plan to hold further enoxaparin/warfarin doses. IV PPI BID started. Will monitor daily.  Received Vit K '5mg'$  PO on 9/12 '@0700'$   Warfarin dose PTA 5 mg MWF, 2.5 mg AOD  Goal of Therapy:  INR 2-3 Monitor platelets by anticoagulation protocol: Yes   Plan:  Hold warfarin dose today Hold enoxaparin '60mg'$  Q12H bridge for now Daily INR  Erskine Speed, PharmD Clinical Pharmacist 07/16/2022,12:18 PM

## 2022-07-16 NOTE — Progress Notes (Signed)
Called rapid response nurse to request assessment based on primary RN concern. Pt has become lethargic. She responds to voice but is very weak and hypotensive. Unable to get accurate SpO2 reading via fingers or on earlobes.  RRN initiated LR bolus. MD notified.

## 2022-07-16 NOTE — Progress Notes (Addendum)
eLink Physician-Brief Progress Note Patient Name: Natasha Livingston DOB: Sep 17, 1934 MRN: [id number]   Date of Service  07/16/2022  HPI/Events of Note  88/F with CKD, ischemic cardiomyopathy, who sustained a left femur fracture after a fall now s/p IM nailing but transferred to the ICU due to hypotension, hypoxemia, renal failure and atrial fibrillation in RVR.   BP 77/57, HR 95, RR 21, O2 sats  Pt is awake, ill-appreaing but does not appear to be in distress.   Troponin 329 EKG atrial fibrillation, T wave inversion in II, III, avF, seen on prior EKG.   eICU Interventions  Start on norepinephrine to maintain MAP >65.  Get CXR.  Pt given lokelma.  Follow up BMP.  Continue to trend troponin.  Insert foley. Monitor urine output.       Intervention Category Evaluation Type: New Patient Evaluation  [name] 07/16/2022, 10:45 PM  11:57 PM Notified of lactic acid >9  Plan> Get ABG.

## 2022-07-16 NOTE — Progress Notes (Signed)
MD at bedside for family update. Pt's son and other family present at bedside.

## 2022-07-16 NOTE — Progress Notes (Signed)
Report called to 5N. Primary RN to discuss pt status and acuity with her charge RN. RRN to assess pt before transfer.

## 2022-07-16 NOTE — Evaluation (Signed)
Physical Therapy Evaluation Patient Details Name: Natasha Livingston MRN: [id number] DOB: April 21, 1934 Today's Date: 07/16/2022  History of Present Illness  88 year old person admitted after a fall at home resulting in a left femoral neck fracture; s/p surgical fixation on 9/12, WBAT; recovery course complicated by AMS, orhtostatic hypotension; PMH:  valvular atrial fibrillation anticoagulated on VKA, chronic heart failure with reduced ejection fraction, all complicated by frailty and advanced age  Clinical Impression   Pt admitted with above diagnosis. Lives at home with daytime caregiver and family assist, in a single -level home; Prior to admission, pt was able to walk wit a cane, and bathe without assist; Managing overall independently overnight; Presents to PT with significantly decr activity tolerance, and pain limiting functional mobility ;  Needed Max assist to roll and 2 person assist to sit up at the EOB; Once up, not pt became less responsive, and we opted to lay her back down; Discussed AIR with pt's son -- family is willing to arrange for 24 hour care, and she was modified independent prior to this incident; worth considering AIR to maximize independence and safety with mobility and ADLs prior to getting home; Of course AIR when she is medically ready, and can show more activity tolerance; Pt currently with functional limitations due to the deficits listed below (see PT Problem List). Pt will benefit from skilled PT to increase their independence and safety with mobility to allow discharge to the venue listed below.       Noted Natasha Livingston most recent note, and that her medical prognosis is guarded.     Recommendations for follow up therapy are one component of a multi-disciplinary discharge planning process, led by the attending physician.  Recommendations may be updated based on patient status, additional functional criteria and insurance authorization.  Follow Up Recommendations Acute  inpatient rehab (3hours/day)      Assistance Recommended at Discharge Frequent or constant Supervision/Assistance  Patient can return home with the following       Equipment Recommendations Wheelchair (measurements PT);Wheelchair cushion (measurements PT)  Recommendations for Other Services       Functional Status Assessment Patient has had a recent decline in their functional status and demonstrates the ability to make significant improvements in function in a reasonable and predictable amount of time.     Precautions / Restrictions Precautions Precautions: Fall Precaution Comments: Near syncopal sitting EOB on PT eval; check orthostatics Restrictions Weight Bearing Restrictions: No LLE Weight Bearing: Weight bearing as tolerated      Mobility  Bed Mobility Overal bed mobility: Needs Assistance Bed Mobility: Supine to Sit, Sit to Supine, Rolling Rolling: Max assist   Supine to sit: +2 for physical assistance, Total assist Sit to supine: +2 for physical assistance, Total assist   General bed mobility comments: 2 person assist for all aspects of bed mobility    Transfers                   General transfer comment: Deferred due to decr upright sitting tolerance at EOB    Ambulation/Gait                  Stairs            Wheelchair Mobility    Modified Rankin (Stroke Patients Only)       Balance Overall balance assessment: Needs assistance Sitting-balance support: Bilateral upper extremity supported, Feet supported Sitting balance-Leahy Scale: Zero Sitting balance - Comments: Sat EOB less than  2 minutes; Needed assist/support                                     Pertinent Vitals/Pain Pain Assessment Pain Assessment: Faces Faces Pain Scale: Hurts little more Pain Location: L hip with movement Pain Descriptors / Indicators: Grimacing Pain Intervention(s): Monitored during session, Repositioned    Home Living  Family/patient expects to be discharged to:: Private residence Living Arrangements: Alone Available Help at Discharge: Family;Personal care attendant (Has assist in teh home during daytime hours) Type of Home: House         Home Layout: One level Home Equipment: Cane - single Barista (2 wheels);Shower seat;Grab bars - tub/shower      Prior Function Prior Level of Function : Needs assist             Mobility Comments: Walks with a cane at baseline ADLs Comments: uses a shower chair in her tub/shower; Hired caregiver most of the day helps with IADLs     Hand Dominance        Extremity/Trunk Assessment   Upper Extremity Assessment Upper Extremity Assessment: Generalized weakness    Lower Extremity Assessment Lower Extremity Assessment: LLE deficits/detail LLE Deficits / Details: Grossly decr AROM and strength, limited by pain postop LLE: Unable to fully assess due to pain       Communication   Communication: HOH;Other (comment) (slow to answer questions on eval)  Cognition Arousal/Alertness: Awake/alert Behavior During Therapy: WFL for tasks assessed/performed (very pleasant) Overall Cognitive Status: Impaired/Different from baseline Area of Impairment: Orientation, Following commands, Problem solving                 Orientation Level: Disoriented to, Time, Situation     Following Commands: Follows one step commands inconsistently     Problem Solving: Slow processing, Decreased initiation, Difficulty sequencing, Requires verbal cues, Requires tactile cues General Comments: Perseverated on using a cane to walk during the conversation about PLOF and home setup        General Comments General comments (skin integrity, edema, etc.):   07/16/22 1054 07/16/22 1100 07/16/22 1104  Vital Signs  Patient Position (if appropriate) Orthostatic Vitals  --   --   Orthostatic Lying   BP- Lying 112/87 (MAP 96) (!) 116/97 (MAP 104)  --   Pulse- Lying  68 (taken from vitals machine; on tele pulse has been consistently 100s, 110s) 105 (supine immediately after lying down)  --   Orthostatic Sitting  BP- Sitting  (Unable to sit EOB long enough to get a BP read; became less responsive, and opted to lay back down)  --  91/76 (MAP 82; at near upright sitting with bed in chair position)  Pulse- Sitting  --   --  96    07/16/22 1108  Vital Signs  Patient Position (if appropriate)  --   Orthostatic Lying   BP- Lying 102/77 (immediately after bringing bed back to supine position)  Pulse- Lying 105  Orthostatic Sitting  BP- Sitting  --   Pulse- Sitting  --        Exercises     Assessment/Plan    PT Assessment Patient needs continued PT services  PT Problem List Decreased strength;Decreased range of motion;Decreased activity tolerance;Decreased balance;Decreased mobility;Decreased cognition;Decreased knowledge of use of DME;Decreased safety awareness;Decreased knowledge of precautions;Cardiopulmonary status limiting activity;Pain       PT Treatment Interventions DME instruction;Gait training;Functional mobility  training;Therapeutic activities;Stair training;Therapeutic exercise;Balance training;Neuromuscular re-education;Cognitive remediation;Patient/family education;Wheelchair mobility training    PT Goals (Current goals can be found in the Care Plan section)  Acute Rehab PT Goals Patient Stated Goal: Did not state PT Goal Formulation: With patient/family Time For Goal Achievement: 07/30/22 Potential to Achieve Goals: Fair    Frequency Min 4X/week     Co-evaluation               AM-PAC PT "6 Clicks" Mobility  Outcome Measure Help needed turning from your back to your side while in a flat bed without using bedrails?: Total Help needed moving from lying on your back to sitting on the side of a flat bed without using bedrails?: Total Help needed moving to and from a bed to a chair (including a wheelchair)?: Total Help  needed standing up from a chair using your arms (e.g., wheelchair or bedside chair)?: Total Help needed to walk in hospital room?: Total Help needed climbing 3-5 steps with a railing? : Total 6 Click Score: 6    End of Session   Activity Tolerance: Other (comment) (limited by decr sitting tolerance) Patient left: in bed;with call bell/phone within reach;with nursing/sitter in room;with family/visitor present Nurse Communication: Mobility status (Vitals) PT Visit Diagnosis: Other abnormalities of gait and mobility (R26.89);History of falling (Z91.81);Muscle weakness (generalized) (M62.81);Pain Pain - Right/Left: Left Pain - part of body: Hip    Time: 7106-2694 PT Time Calculation (min) (ACUTE ONLY): 60 min   Charges:   PT Evaluation $PT Eval Moderate Complexity: 1 Mod PT Treatments $Therapeutic Activity: 38-52 mins        Roney Marion, PT  Acute Rehabilitation Services Office [phone]   Natasha Livingston 07/16/2022, 2:52 PM

## 2022-07-16 NOTE — Progress Notes (Signed)
Upon assessment, it was noted that pt was becoming more restless and anxious. MD at bedside at the time. Vital signs remained WNL on 3L [place].

## 2022-07-16 NOTE — Progress Notes (Signed)
Inpatient Rehab Admissions Coordinator:   Per therapy recommendations, patient was screened for CIR candidacy by Clemens Catholic, MS, CCC-SLP. At this time, Pt. is not yet demonstrating ability to tolerate the intensity of CIR   Pt. may have potential to progress to becoming a potential CIR candidate, so CIR admissions team will follow and monitor for progress and participation with therapies and place consult order if Pt. appears to be an appropriate candidate. Please contact me with any questions.    Clemens Catholic, [place], [place] Admissions Coordinator  [phone] ([place]) [phone] (office)

## 2022-07-16 NOTE — Progress Notes (Addendum)
Met and discussed with patient's son, Natasha Livingston, at bedside. Additional family members present as well. Updated patient regarding new hematemesis and renal injury. Discussed patients overall serious comorbidites including heart disease, frailty, and the poor long-term prognosis of hip fractures in the elderly. Informed Natasha Livingston that given the patient's multiple comorbidities, as well as her new/emerging medical problems that she is at high risk of decompensation. Additionally discussed that should she decompensate, there is a high likelihood of a poor outcome due to her known comorbidities. Also discussed that should she survive this hospitalization, that she likely will not be able to return to her baseline of functioning beforehand. Natasha Livingston voiced understanding of the severity, but expressed that he would like for patient to continue to receive full scope of treatment in the mean time, citing recovery from previous hospitalization for pneumonia. Lastly, discussed escalation of care should patient require it. He  He again voiced understanding that patient is high risk or decompensation with a poor outcome, and that CPR would likely be futile, but stated that he would like for her to remain FULL code including intubation and CPR.  ADDENDUM: Spoke with GI. Planning on scope tomorrow. Requesting INR less than 2.0 and FFP transfusion to meet this goal. Will plan to recheck CBC and INR tonight. If INR remains elevated, can proceed with FFP transfusion.

## 2022-07-16 NOTE — Progress Notes (Addendum)
HD#2 SUBJECTIVE:  Patient Summary:Patient is an 88 year old female with a hx of valvular afib on warfarin, mitral valve prolapse, ischemic heart disease s/p CABG, HTN, who presented with complaints of hip pain.  Overnight Events: NAEO    Interm History:  Patient seen and evaluated at bedside. Stated she was thirsty. Began drinking and subsequently vomited up dark brown/rust colored stomach contents.    OBJECTIVE:  Vital Signs: Vitals:   07/16/22 0528 07/16/22 0913 07/16/22 0927 07/16/22 0958  BP: 109/77 119/79 99/68 101/77  Pulse: (!) 103 (!) 115 (!) 112 (!) 106  Resp:  '18 18 18  '$ Temp: (!) 97.5 F (36.4 C)     TempSrc: Oral     SpO2: 100% 100% 100% 100%  Weight:      Height:       Supplemental O2:  SpO2: 100 % O2 Flow Rate (L/min): 3 L/min  Filed Weights   07/23/2022 1037 07/25/2022 0622 07/16/22 0100  Weight: 61.2 kg 63.3 kg 65 kg     Intake/Output Summary (Last 24 hours) at 07/16/2022 1034 Last data filed at 07/16/2022 0500 Gross per 24 hour  Intake 1250 ml  Output 100 ml  Net 1150 ml   Net IO Since Admission: 800 mL [07/16/22 1034]  Physical Exam: Constitutional: tired and ill appearing female, resting in bed HENT: left periorbital ecchymosis Cardiovascular: regular rate, irregular rhythm pulsatile neck veins, RV heave Pulmonary: normal work of breathing MSK: legs symmetric length, left hip with bandages. No bruising or bleeding around surgical site.  Neurological: alert, oriented to person, place. Conversational. Responds appropriately. Moves upper extremities. Moves BLUE, moves toes of lower extremities  Skin: pale  Patient Lines/Drains/Airways Status     Active Line/Drains/Airways     Name Placement date Placement time Site Days   Peripheral IV 07/13/2022 20 G Right Antecubital 07/23/2022  2038  Antecubital  1   External Urinary Catheter 07/12/2022  0332  --  less than 1            Pertinent Labs:    Latest Ref Rng & Units 07/16/2022    9:37 AM  07/12/2022   12:58 AM 07/26/2022   11:02 AM  CBC  WBC 4.0 - 10.5 K/uL 17.0  12.2    Hemoglobin 12.0 - 15.0 g/dL 11.2  11.5  13.3   Hematocrit 36.0 - 46.0 % 34.5  33.0  39.0   Platelets 150 - 400 K/uL 201  202         Latest Ref Rng & Units 07/20/2022   12:58 AM 07/18/2022   11:02 AM 07/30/2022   10:41 AM  CMP  Glucose 70 - 99 mg/dL 212  211  210   BUN 8 - 23 mg/dL '22  22  22   '$ Creatinine 0.44 - 1.00 mg/dL 1.17  0.80  0.98   Sodium 135 - 145 mmol/L 134  136  136   Potassium 3.5 - 5.1 mmol/L 4.6  3.7  3.8   Chloride 98 - 111 mmol/L 99  101  99   CO2 22 - 32 mmol/L 25   23   Calcium 8.9 - 10.3 mg/dL 9.5   9.6   Total Protein 6.5 - 8.1 g/dL   6.7   Total Bilirubin 0.3 - 1.2 mg/dL   1.4   Alkaline Phos 38 - 126 U/L   152   AST 15 - 41 U/L   26   ALT 0 - 44 U/L   34  Recent Labs    07/18/2022 1310 07/28/2022 1430 07/16/22 0907  GLUCAP 238* 191* 150*     Pertinent Imaging: DG FEMUR PORT MIN 2 VIEWS LEFT  Result Date: 07/11/2022 CLINICAL DATA:  IM nail of the left femur EXAM: LEFT FEMUR PORTABLE 2 VIEWS COMPARISON:  07/31/2022 intraoperative spot images. FINDINGS: Left proximal femur IM nail with interlocking femoral head screw in place and traversing the known left proximal femur intertrochanteric fracture. Near anatomic alignment. Single distal incomplete interlocking screw. Expected gas in the adjacent soft tissues. No new fracture or complicating feature observed. Osteoarthritis of the left knee noted. Probable meniscal chondrocalcinosis. Superficial femoral artery atherosclerosis. Contrast medium in the urinary bladder. Lower lumbar spondylosis and degenerative disc disease. IMPRESSION: 1. Left proximal femoral IM nail with interlocking femoral head screw, no complicating feature. 2. Osteoarthritis left knee probably with meniscal chondrocalcinosis. 3. Atherosclerosis. Electronically Signed   By: Van Clines M.D.   On: 07/13/2022 16:46   DG HIP UNILAT WITH PELVIS 1V  LEFT  Result Date: 07/31/2022 CLINICAL DATA:  Intramedullary nailing of the LEFT femur. EXAM: DG HIP (WITH OR WITHOUT PELVIS) 1V*L*; DG C-ARM 1-60 MIN-NO REPORT COMPARISON:  Chest abdomen and pelvis CT from July 14, 2022 FINDINGS: Intraoperative fluoroscopic images a total of 9 images are provided. These images display changes of intramedullary rod placement and dynamic hip screw placement for fixation of the comminuted intratrochanteric fracture seen on the recent CT evaluation. No immediate intra procedural findings which are unexpected. Fluoroscopic dose: 16.17 mGy IMPRESSION: Intraoperative images of ORIF of a comminuted LEFT femoral fracture of the intratrochanteric LEFT femur. Electronically Signed   By: Zetta Bills M.D.   On: 07/23/2022 14:20   DG C-Arm 1-60 Min-No Report  Result Date: 07/21/2022 Fluoroscopy was utilized by the requesting physician.  No radiographic interpretation.   DG C-Arm 1-60 Min-No Report  Result Date: 07/18/2022 Fluoroscopy was utilized by the requesting physician.  No radiographic interpretation.    ASSESSMENT/PLAN:  Assessment: Principal Problem:   Hip fracture (HCC) Active Problems:   Atrial fibrillation (HCC)   Hypertension   Aortic stenosis   Ischemic heart disease   Patient is an 88 year old female with a hx of valvular afib on warfarin, mitral valve prolapse, ischemic heart disease s/p CABG, HTN, admitted for femoral fracture sustained after GLF.  # Emesis Patient with several episodes of brown vomiting overnight. Forceful dark brown/rust colored emesis after drinking water during evaluation. Tachycardic overnight and lethargic on exam. Suspicious for hematemesis. BUN elevation may reflect renal injury vs GIB. No history of prior GIB. DDX include stress ulcer, pain, anesthesia side effect, opioid side effect, trauma s/p intubation, intracranial process. Per nurse, patient did show some improvement after vomiting episode however remains tachy  and appeared "pale and glassy eyed" while sitting on bedside - IVFs and monitor HR/BP - Hold warfarin today, recheck INR this PM if evidence of continued bleeding - PM BMP - consider NGT if patient continues to vomit and not hematemesis  - orthostatics - will need to closely monitor patient for signs of acute deterioration.  Hyperkalemia Could be related to renal injury. Also possible that if she is having true hematemesis she may be absorbing K in GI tract causing elevation.  Has prolonged QT on prior EKG with underlying valvular afib placing her at high risk of cardiac complications.  - lokelma - EKG - repeat PM BMP  Acute kidney injury Elevation in BUN and Cr. Minimal UOP noted overnight. Possibly pre-renal since she  has not had much PO intake since admission, but cannot exclude post-op urinary retention.  - fluids - bladder scan - foley if retaining - PM BMP - hold entresto - stop IV morhpine and oxycodone  # Comminuted left intertrochanteric proximal femur fracture # Osteopenia # Frailty  Sustained after ground level fall. Small intramuscular hematoma noted on CT. High risk finding with significant mortality association.  Pain partially controlled by nerve block performed by anesthesia in ED.  S/P IMN yesterday. Hip appears to be doing well.  - pain control with IV dilaudid. Avoid morphine and oxycodone in setting of AKI - PT/OT as able - monitor vital signs.  - daily CBC, Consider repeat pelvis CT if hypotensive or significant drop in hgb - anticipate SNF placement, TOC consulted. - consider bisphosphonate as outpatient.   # Left orbital floor fracture Sustained during fall. Moderate left maxillary hemosinus noted. Vision intact, EOM intact. No pain with eye movement. No active epistaxis. No concern for acute orbit/orbital compartment syndrome at this time.  Likely nothing to do for this, however, can be followed up as outpatient as necessary.    # Valvular afib on  warfarin # MR caused by MVP INR goal 2-3. CHADSVASC score 6.  - hold lovenox due to suspicion for hematemesis.  - cardiac monitoring   # HFrEF 35-40% 2019 # HTN # CAD s/p CABG 2008 Does not appear to be volume overloaded at this time.  - daily cbc - continue home metoprolol  - hold entresto - can restart Lasix once patient is taking PO again   # Memory problems There is a hx of possible dementia. Per son, baseline is conversant, alert, but oriented only to self. Has help at home. Able to do ADLs.  Appears to be at baseline. Does have hx hospital delirium - delirium precautions.  - Son to stay the night to assist with re-orienting   Diet: NPO VTE: None SCDs IVF: None,None Code: Full Prior to Admission Living Arrangement: Home, living with help Anticipated Discharge Location: SNF Barriers to Discharge: medical stability Dispo: Admit patient to Inpatient with expected length of stay greater than 2 midnights.   Signed: Delene Ruffini, MD

## 2022-07-16 NOTE — Progress Notes (Signed)
An USGPIV (ultrasound guided PIV) has been placed for short-term vasopressor infusion. A correctly placed ivWatch must be used when administering Vasopressors. Should this treatment be needed beyond 72 hours, central line access should be obtained.  It will be the responsibility of the bedside nurse to follow best practice to prevent extravasations.   ?

## 2022-07-16 NOTE — Consult Note (Addendum)
NAME:  Natasha Livingston, MRN:  [id number], DOB:  08-22-1934, LOS: 2 ADMISSION DATE:  08/02/2022, CONSULTATION DATE: 9/13 REFERRING MD: Dr. Evette Doffing, CHIEF COMPLAINT: Hypotension  History of Present Illness:  88 year old female with past medical history as below, which is significant for atrial fibrillation on warfarin, HFrEF, coronary artery disease status post CABG in 2008, hypertension, and hypothyroidism.  She lives at home by herself but does have caregivers, and throughout the day.  Son reports that she is typically only oriented to person and often forgets to take her medications.  On 9/11 she lost her balance getting out of bed and suffered a fall. Presented to [facility] emergency department with complaints of hip and facial pain.  Trauma work-up acutely positive for left orbital floor fracture and intertrochanteric left proximal femur fracture.  Also showed some evidence of right heart disease.  She was admitted to the internal medicine teaching service for left hip fracture.  She underwent intramedullary nailing of the left hip on 9/12.  Perioperative course was uneventful.  On 9/13 she was noted to be more lethargic and transiently hypotensive.  Several episodes of vomiting concerning for hematemesis were noted.  Hemoccult of the gastric contents were positive.  She was treated with IV fluid resuscitation without significant response.  PCCM was consulted for further evaluation.  Pertinent  Medical History   has a past medical history of Atrial fibrillation ([place]), Back pain, Bruises easily, Chest discomfort, Diverticulitis, Dizziness, DOE (dyspnea on exertion), Epistaxis (06/16/2019), Forgetfulness, cardiovascular stress test, Hyperlipidemia, Hypertension, Hypothyroidism, Interstitial nephritis, Ischemic heart disease, MVP (mitral valve prolapse), and Neuropathy.   Significant Hospital Events: Including procedures, antibiotic start and stop dates in addition to other pertinent events   9/11  fall at home, admitted for left femur fracture 9/12 surgical repair 9/13 poor mental status, hypotension, transferred to ICU.  Interim History / Subjective:    Objective   Blood pressure (!) 81/67, pulse (!) 119, temperature 97.6 F (36.4 C), temperature source Oral, resp. rate 18, height '5\' 7"'$  (1.702 m), weight 65 kg, SpO2 100 %.        Intake/Output Summary (Last 24 hours) at 07/16/2022 2109 Last data filed at 07/16/2022 0500 Gross per 24 hour  Intake 150 ml  Output --  Net 150 ml   Filed Weights   07/04/2022 1037 07/28/2022 0622 07/16/22 0100  Weight: 61.2 kg 63.3 kg 65 kg    Examination: General: Frail elderly female in no acute distress HENT: Normocephalic, periorbital bruising on the left, mild jugular venous distention Lungs: Clear bilateral breath sounds Cardiovascular: Irregularly irregular, rate in the 110s, no murmur Abdomen: Soft, nontender, nondistended Extremities: No acute deformity, no edema, surgical dressing in place over the left hip Neuro: Spontaneously awake, alert, oriented to self  Resolved Hospital Problem list     Assessment & Plan:   Shock: Undifferentiated at this point, however, suspicion is towards cardiogenic shock.  She has a known EF of 35 to 40% as well as evidence of right heart failure on CT from admission.  Point-of-care ultrasound showed dilated right atrium and ventricle with some flattening of the interventricular septum.  IVC plump with no respiratory variation.  Concern for right heart failure.  Last couple of maps have been greater than 65. Fat embolism considered, however, she has not been increasingly hypoxic. Felt to be unlikely.  -Transfer to ICU -Trial of diuresis -We will add norepinephrine if necessary for map goal 65 -We will request formal echocardiogram -Check lactic acid  Acute on chronic HFrEF Valvular atrial fib with RVR CAD s/p CABG 2008 - Telemetry monitoring - Hold metoprolol - Holding anticoagulation in the  setting of hematemesis - Consider amiodarone infusion - Echocardiogram  Acute renal failure Hyperkalemia - Lasix - Pressors if indicated - Trend BMP - Lokelma given  Left hip fx -  secondary to mechanical fall. S/p intramedullary nailing 9/12 - per ortho  Hematemesis: Gastroccult positive 9/13. No associated hemoglobin drop. Last noted 9/13 AM. Vitamin K given x 2 for INR 3.6.  - Protonix IV BID - GI following - Plans for EGD 9/14, however, given clinical declines she will likely not tolerate.   Left orbital floor fx - monitor clinically  Goals of care: Plan discussed with son. Some history of possible dementia. Requires some level of care at baseline. He understands she is critically ill and may not survive the hospitalization. Continue full code for now. He would like to discuss options with family.   Best Practice (right click and "Reselect all SmartList Selections" daily)   Diet/type: NPO DVT prophylaxis: SCD GI prophylaxis: PPI Lines: N/A Foley:  Yes, and it is still needed Code Status:  full code Last date of multidisciplinary goals of care discussion [ 9/13 as noted above]  Labs   CBC: Recent Labs  Lab 07/26/2022 1041 07/07/2022 1102 07/26/2022 0058 07/16/22 0937 07/16/22 1352 07/16/22 1849  WBC 9.3  --  12.2* 17.0* 16.3* 19.6*  HGB 12.9 13.3 11.5* 11.2* 10.7* 11.1*  HCT 37.9 39.0 33.0* 34.5* 33.8* 34.9*  MCV 103.0*  --  100.9* 106.8* 110.5* 111.9*  PLT 234  --  202 201 215 267    Basic Metabolic Panel: Recent Labs  Lab 07/31/2022 1041 07/27/2022 1102 07/10/2022 2158 07/16/2022 0058 07/16/22 0937 07/16/22 1352 07/16/22 1849  NA 136 136  --  134* 135 135 137  K 3.8 3.7  --  4.6 5.7* 5.7* 5.9*  CL 99 101  --  99 100 99 100  CO2 23  --   --  25 15* 11* 9*  GLUCOSE 210* 211*  --  212* 164* 110* 61*  BUN 22 22  --  22 41* 43* 43*  CREATININE 0.98 0.80  --  1.17* 2.49* 3.17* 2.91*  CALCIUM 9.6  --   --  9.5 9.7 9.7 10.0  MG  --   --  1.9  --   --   --   --     GFR: Estimated Creatinine Clearance: 13.5 mL/min (A) (by C-G formula based on SCr of 2.91 mg/dL (H)). Recent Labs  Lab 07/10/2022 1041 07/04/2022 0058 07/16/22 0937 07/16/22 1352 07/16/22 1849  WBC 9.3 12.2* 17.0* 16.3* 19.6*  LATICACIDVEN 1.9  --   --   --   --     Liver Function Tests: Recent Labs  Lab 07/21/2022 1041  AST 26  ALT 34  ALKPHOS 152*  BILITOT 1.4*  PROT 6.7  ALBUMIN 3.4*   No results for input(s): "LIPASE", "AMYLASE" in the last 168 hours. Recent Labs  Lab 07/05/2022 2158  AMMONIA 14    ABG    Component Value Date/Time   PHART 7.383 04/12/2007 2226   PCO2ART 40.1 04/12/2007 2226   PO2ART 100.0 04/12/2007 2226   HCO3 23.9 04/12/2007 2226   TCO2 24 07/16/2022 1102   ACIDBASEDEF 1.0 04/12/2007 2226   O2SAT 98.0 04/12/2007 2226     Coagulation Profile: Recent Labs  Lab 07/04/2022 1041 08/02/2022 0058 07/16/2022 0850 07/16/22 0937 07/16/22 1849  INR 2.1* 1.9* 1.8* 2.9* 3.6*    Cardiac Enzymes: No results for input(s): "CKTOTAL", "CKMB", "CKMBINDEX", "TROPONINI" in the last 168 hours.  HbA1C: Hgb A1c MFr Bld  Date/Time Value Ref Range Status  02/24/2021 07:17 AM 7.5 (H) 4.8 - 5.6 % Final    Comment:    (NOTE) Pre diabetes:          5.7%-6.4%  Diabetes:              >6.4%  Glycemic control for   <7.0% adults with diabetes   04/08/2007 03:56 PM (H)  Final   6.9 (NOTE)   The ADA recommends the following therapeutic goals for glycemic   control related to Hgb A1C measurement:   Goal of Therapy:   < 7.0% Hgb A1C   Action Suggested:  > 8.0% Hgb A1C   Ref:  Diabetes Care, 22, Suppl. 1, 1999    CBG: Recent Labs  Lab 07/04/2022 1116 07/06/2022 1310 07/11/2022 1430 07/16/22 0907  GLUCAP 219* 238* 191* 150*    Review of Systems:   Patient is encephalopathic and/or intubated. Therefore history has been obtained from chart review.   Past Medical History:  She,  has a past medical history of Atrial fibrillation ([place]), Back pain, Bruises easily,  Chest discomfort, Diverticulitis, Dizziness, DOE (dyspnea on exertion), Epistaxis (06/16/2019), Forgetfulness, cardiovascular stress test, Hyperlipidemia, Hypertension, Hypothyroidism, Interstitial nephritis, Ischemic heart disease, MVP (mitral valve prolapse), and Neuropathy.   Surgical History:   Past Surgical History:  Procedure Laterality Date   CARDIOVERSION  08/2008   CORONARY ARTERY BYPASS GRAFT  04/2007   HAND SURGERY Right    INTRAMEDULLARY (IM) NAIL INTERTROCHANTERIC Left 07/28/2022   Procedure: INTRAMEDULLARY NAILING OF LEFT FEMUR;  Surgeon: Altamese Joseph, MD;  Location: [place];  Service: Orthopedics;  Laterality: Left;   OVARIAN CYST REMOVAL     PARTIAL HYSTERECTOMY     1970s   skin cancer removal       Social History:   reports that she has never smoked. She has never been exposed to tobacco smoke. She has never used smokeless tobacco. She reports that she does not drink alcohol and does not use drugs.   Family History:  Her family history includes Cerebral aneurysm in her father and another family member; Healthy in her son and son; Hypertension in her father; Lung cancer in her mother; Prostate cancer in her son. There is no history of Heart attack or Stroke.   Allergies Allergies  Allergen Reactions   Crestor [Rosuvastatin Calcium] Other (See Comments)    Unknown reaction    Lipitor [Atorvastatin Calcium] Swelling   Pacerone [Amiodarone] Other (See Comments)    Unknown reaction      Home Medications  Prior to Admission medications   Medication Sig Start Date End Date Taking? Authorizing Provider  acetaminophen (TYLENOL) 650 MG CR tablet Take 650 mg by mouth every 8 (eight) hours as needed for pain.   Yes [provider]  allopurinol (ZYLOPRIM) 300 MG tablet Take 300 mg by mouth daily.  01/03/18  Yes [provider]  DULoxetine (CYMBALTA) 60 MG capsule Take 30 mg by mouth at bedtime. 03/12/22  Yes [provider]  furosemide (LASIX) 40 MG  tablet TAKE 1 TABLET BY MOUTH TWICE A DAY Patient taking differently: Take 40 mg by mouth 2 (two) times daily. 05/12/22  Yes Jerline Pain, MD  KLOR-CON M20 20 MEQ tablet TAKE 2 TABLETS BY MOUTH DAILY Patient taking differently: Take 20 mEq by  mouth 2 (two) times daily. 04/08/22  Yes Jerline Pain, MD  levothyroxine (SYNTHROID) 25 MCG tablet Take 25 mcg by mouth daily before breakfast.   Yes [provider]  Menthol, Topical Analgesic, (BIOFREEZE) 5 % PTCH Apply 1 patch topically as needed (For back pain).   Yes [provider]  metoprolol succinate (TOPROL-XL) 50 MG 24 hr tablet TAKE 1 TABLET BY MOUTH TWICE A DAY WITH OR IMMEDIATELY FOLLOWING A MEAL Patient taking differently: Take 50 mg by mouth in the morning and at bedtime. Immediately following or with a meal. 05/12/22  Yes Skains, Thana Farr, MD  sacubitril-valsartan (ENTRESTO) 24-26 MG Take 1 tablet by mouth 2 (two) times daily. 02/26/22  Yes Marylu Lund., NP  warfarin (COUMADIN) 5 MG tablet TAKE 1/2 TO 1 TABLET ONCE DAILY AS DIRECTED BY COUMADIN CLINIC Patient taking differently: Take 2.5-5 mg by mouth See admin instructions. Take 1 tablet (5 mg) by mouth Monday, Wednesday and Friday then take 1/2 tablet (2.5 mg) on Tuesday, Thursday, Saturday and Sundays 05/12/22  Yes Jerline Pain, MD  benzonatate (TESSALON) 100 MG capsule Take 1 capsule (100 mg total) by mouth every 8 (eight) hours. Patient not taking: Reported on 07/23/2022 06/01/22   Ward, Lenise Arena, PA-C  nitroGLYCERIN (NITROSTAT) 0.4 MG SL tablet Place 1 tablet (0.4 mg total) under the tongue every 5 (five) minutes as needed for chest pain. 03/28/21   Jerline Pain, MD     Critical care time: 55 minutes     Georgann Housekeeper, AGACNP-BC [facility]  See Amion for personal pager PCCM on call pager [phone] until 7pm. Please call Elink 7p-7a. [phone]  07/16/2022 9:45 PM

## 2022-07-16 NOTE — Consult Note (Addendum)
Referring Provider: [facility] Primary Care Physician:  Cyndi Bender, PA-C Primary Gastroenterologist:  Sadie Haber  Reason for Consultation:  hematemesis  HPI: Natasha Livingston is a 88 y.o. female hx of valvular afib on warfarin, mitral valve prolapse, ischemic heart disease s/p CABG, HTN, 1 day s/p intramedullary nailing of the left femur.   This morning when being rounded by the hospital team she had brownish/red emesis that was heme positive. She had one other episode of brown emesis this morning and several episodes overnight. No further episodes. She has no previous GI bleeds. Family denies melena, hematochezia. She is taking Warfarin for A-fib, warfarin was held and she was given IV vit K after hematemesis episodes. This afternoon patient became lethargic and hypotensive. Her ability to communicate is below baseline according to family. GI was consulted.   She has poor oral intake since surgery, able to tolerate ice chips.  Denies NSAID use. No PPI prior to admission.  Last colonoscopy 06/2010 Dr. Oletta Lamas: moderate diverticulosis, no polyps No previous EGD  Past Medical History:  Diagnosis Date   Atrial fibrillation ([place])    Back pain    Bruises easily    Chest discomfort    Diverticulitis    Dizziness    DOE (dyspnea on exertion)    Epistaxis 06/16/2019   Forgetfulness    Hx of cardiovascular stress test    Lexiscan Myoview (12/15):  Apical lateral and anterolateral fixed defect, no ischemia, EF 54%; low risk   Hyperlipidemia    Hypertension    Hypothyroidism    Interstitial nephritis    Ischemic heart disease    MVP (mitral valve prolapse)    Neuropathy     Past Surgical History:  Procedure Laterality Date   CARDIOVERSION  08/2008   CORONARY ARTERY BYPASS GRAFT  04/2007   HAND SURGERY Right    INTRAMEDULLARY (IM) NAIL INTERTROCHANTERIC Left 07/12/2022   Procedure: INTRAMEDULLARY NAILING OF LEFT FEMUR;  Surgeon: Altamese Chatfield, MD;  Location: [place];  Service: Orthopedics;   Laterality: Left;   OVARIAN CYST REMOVAL     PARTIAL HYSTERECTOMY     1970s   skin cancer removal      Prior to Admission medications   Medication Sig Start Date End Date Taking? Authorizing Provider  acetaminophen (TYLENOL) 650 MG CR tablet Take 650 mg by mouth every 8 (eight) hours as needed for pain.   Yes [provider]  allopurinol (ZYLOPRIM) 300 MG tablet Take 300 mg by mouth daily.  01/03/18  Yes [provider]  DULoxetine (CYMBALTA) 60 MG capsule Take 30 mg by mouth at bedtime. 03/12/22  Yes [provider]  furosemide (LASIX) 40 MG tablet TAKE 1 TABLET BY MOUTH TWICE A DAY Patient taking differently: Take 40 mg by mouth 2 (two) times daily. 05/12/22  Yes Skains, Thana Farr, MD  KLOR-CON M20 20 MEQ tablet TAKE 2 TABLETS BY MOUTH DAILY Patient taking differently: Take 20 mEq by mouth 2 (two) times daily. 04/08/22  Yes Jerline Pain, MD  levothyroxine (SYNTHROID) 25 MCG tablet Take 25 mcg by mouth daily before breakfast.   Yes [provider]  Menthol, Topical Analgesic, (BIOFREEZE) 5 % PTCH Apply 1 patch topically as needed (For back pain).   Yes [provider]  metoprolol succinate (TOPROL-XL) 50 MG 24 hr tablet TAKE 1 TABLET BY MOUTH TWICE A DAY WITH OR IMMEDIATELY FOLLOWING A MEAL Patient taking differently: Take 50 mg by mouth in the morning and at bedtime. Immediately following or  with a meal. 05/12/22  Yes Skains, Thana Farr, MD  sacubitril-valsartan (ENTRESTO) 24-26 MG Take 1 tablet by mouth 2 (two) times daily. 02/26/22  Yes Marylu Lund., NP  warfarin (COUMADIN) 5 MG tablet TAKE 1/2 TO 1 TABLET ONCE DAILY AS DIRECTED BY COUMADIN CLINIC Patient taking differently: Take 2.5-5 mg by mouth See admin instructions. Take 1 tablet (5 mg) by mouth Monday, Wednesday and Friday then take 1/2 tablet (2.5 mg) on Tuesday, Thursday, Saturday and Sundays 05/12/22  Yes Jerline Pain, MD  benzonatate (TESSALON) 100 MG capsule Take 1 capsule (100 mg total) by  mouth every 8 (eight) hours. Patient not taking: Reported on 07/30/2022 06/01/22   Ward, Lenise Arena, PA-C  nitroGLYCERIN (NITROSTAT) 0.4 MG SL tablet Place 1 tablet (0.4 mg total) under the tongue every 5 (five) minutes as needed for chest pain. 03/28/21   Jerline Pain, MD    Scheduled Meds:  acetaminophen  1,000 mg Oral TID   vitamin C  1,000 mg Oral Daily   cholecalciferol  2,000 Units Oral BID   docusate sodium  100 mg Oral BID   DULoxetine  30 mg Oral QHS   levothyroxine  25 mcg Oral QAC breakfast   metoprolol succinate  50 mg Oral Daily   pantoprazole (PROTONIX) IV  40 mg Intravenous Q12H   Warfarin - Pharmacist Dosing Inpatient   Does not apply q1600   zinc sulfate  220 mg Oral Daily   Continuous Infusions:  lactated ringers     lactated ringers 100 mL/hr at 07/16/22 1500   phytonadione (VITAMIN K) 5 mg in dextrose 5 % 50 mL IVPB     PRN Meds:.HYDROmorphone (DILAUDID) injection, influenza vaccine adjuvanted, menthol-cetylpyridinium **OR** phenol, metoCLOPramide **OR** metoCLOPramide (REGLAN) injection, ondansetron **OR** ondansetron (ZOFRAN) IV  Allergies as of 07/28/2022 - Review Complete 07/23/2022  Allergen Reaction Noted   Crestor [rosuvastatin calcium] Other (See Comments) 03/25/2011   Lipitor [atorvastatin calcium] Swelling 03/25/2011   Pacerone [amiodarone] Other (See Comments) 03/25/2011    Family History  Problem Relation Age of Onset   Lung cancer Mother    Cerebral aneurysm Father    Hypertension Father    Cerebral aneurysm Other    Healthy Son    Healthy Son    Prostate cancer Son    Heart attack Neg Hx    Stroke Neg Hx     Social History   Socioeconomic History   Marital status: Married    Spouse name: Not on file   Number of children: Not on file   Years of education: Not on file   Highest education level: Not on file  Occupational History   Not on file  Tobacco Use   Smoking status: Never    Passive exposure: Never   Smokeless tobacco: Never   Vaping Use   Vaping Use: Never used  Substance and Sexual Activity   Alcohol use: No   Drug use: No   Sexual activity: Never  Other Topics Concern   Not on file  Social History Narrative   Not on file   Social Determinants of Health   Financial Resource Strain: Not on file  Food Insecurity: Not on file  Transportation Needs: Not on file  Physical Activity: Not on file  Stress: Not on file  Social Connections: Not on file  Intimate Partner Violence: Not on file    Review of Systems: All negative except as stated above in HPI.  Physical Exam:  Physical Exam Constitutional:  General: She is not in acute distress.    Appearance: She is normal weight.  HENT:     Head: Normocephalic and atraumatic.     Right Ear: External ear normal.     Left Ear: External ear normal.     Nose: Nose normal.     Mouth/Throat:     Mouth: Mucous membranes are moist.  Eyes:     Pupils: Pupils are equal, round, and reactive to light.  Cardiovascular:     Rate and Rhythm: Regular rhythm. Tachycardia present.     Pulses: Normal pulses.     Heart sounds: Normal heart sounds.  Pulmonary:     Effort: Pulmonary effort is normal.     Breath sounds: Normal breath sounds.  Abdominal:     General: Abdomen is flat. Bowel sounds are normal. There is no distension.     Palpations: Abdomen is soft. There is no mass.     Tenderness: There is no abdominal tenderness. There is no guarding or rebound.     Hernia: No hernia is present.  Musculoskeletal:        General: No swelling. Normal range of motion.     Cervical back: Normal range of motion and neck supple.  Skin:    General: Skin is warm and dry.  Neurological:     Mental Status: She is alert.  Psychiatric:        Mood and Affect: Mood normal.        Behavior: Behavior normal.     Physical Activity: Not on file  ; Vital signs: Vitals:   07/16/22 1454 07/16/22 1535  BP: 108/73 (!) 86/54  Pulse:  (!) 113  Resp: 20 20  Temp:     SpO2:     Last BM Date : 07/22/2022    GI:  Lab Results: Recent Labs    07/05/2022 0058 07/16/22 0937 07/16/22 1352  WBC 12.2* 17.0* 16.3*  HGB 11.5* 11.2* 10.7*  HCT 33.0* 34.5* 33.8*  PLT 202 201 215   BMET Recent Labs    07/19/2022 0058 07/16/22 0937 07/16/22 1352  NA 134* 135 135  K 4.6 5.7* 5.7*  CL 99 100 99  CO2 25 15* 11*  GLUCOSE 212* 164* 110*  BUN 22 41* 43*  CREATININE 1.17* 2.49* 3.17*  CALCIUM 9.5 9.7 9.7   LFT Recent Labs    07/23/2022 1041  PROT 6.7  ALBUMIN 3.4*  AST 26  ALT 34  ALKPHOS 152*  BILITOT 1.4*   PT/INR Recent Labs    07/16/2022 0850 07/16/22 0937  LABPROT 21.1* 30.0*  INR 1.8* 2.9*     Studies/Results: DG FEMUR PORT MIN 2 VIEWS LEFT  Result Date: 07/12/2022 CLINICAL DATA:  IM nail of the left femur EXAM: LEFT FEMUR PORTABLE 2 VIEWS COMPARISON:  07/23/2022 intraoperative spot images. FINDINGS: Left proximal femur IM nail with interlocking femoral head screw in place and traversing the known left proximal femur intertrochanteric fracture. Near anatomic alignment. Single distal incomplete interlocking screw. Expected gas in the adjacent soft tissues. No new fracture or complicating feature observed. Osteoarthritis of the left knee noted. Probable meniscal chondrocalcinosis. Superficial femoral artery atherosclerosis. Contrast medium in the urinary bladder. Lower lumbar spondylosis and degenerative disc disease. IMPRESSION: 1. Left proximal femoral IM nail with interlocking femoral head screw, no complicating feature. 2. Osteoarthritis left knee probably with meniscal chondrocalcinosis. 3. Atherosclerosis. Electronically Signed   By: Van Clines M.D.   On: 07/13/2022 16:46   DG HIP UNILAT WITH  PELVIS 1V LEFT  Result Date: 07/29/2022 CLINICAL DATA:  Intramedullary nailing of the LEFT femur. EXAM: DG HIP (WITH OR WITHOUT PELVIS) 1V*L*; DG C-ARM 1-60 MIN-NO REPORT COMPARISON:  Chest abdomen and pelvis CT from July 14, 2022  FINDINGS: Intraoperative fluoroscopic images a total of 9 images are provided. These images display changes of intramedullary rod placement and dynamic hip screw placement for fixation of the comminuted intratrochanteric fracture seen on the recent CT evaluation. No immediate intra procedural findings which are unexpected. Fluoroscopic dose: 16.17 mGy IMPRESSION: Intraoperative images of ORIF of a comminuted LEFT femoral fracture of the intratrochanteric LEFT femur. Electronically Signed   By: Zetta Bills M.D.   On: 07/11/2022 14:20   DG C-Arm 1-60 Min-No Report  Result Date: 07/06/2022 Fluoroscopy was utilized by the requesting physician.  No radiographic interpretation.   DG C-Arm 1-60 Min-No Report  Result Date: 07/13/2022 Fluoroscopy was utilized by the requesting physician.  No radiographic interpretation.   CT MAXILLOFACIAL WO CONTRAST  Result Date: 07/16/2022 CLINICAL DATA:  Fall with bruising under the right eye EXAM: CT MAXILLOFACIAL WITHOUT CONTRAST TECHNIQUE: Multidetector CT imaging of the maxillofacial structures was performed. Multiplanar CT image reconstructions were also generated. RADIATION DOSE REDUCTION: This exam was performed according to the departmental dose-optimization program which includes automated exposure control, adjustment of the mA and/or kV according to patient size and/or use of iterative reconstruction technique. COMPARISON:  Head CT 08/01/2022 FINDINGS: Osseous: Mastoid air cells are clear. Mandibular heads are normally position. No mandibular fracture. Pterygoid plates and zygomatic arches are intact. No acute nasal bone fracture Orbits: Acute minimally displaced left orbital floor fracture. No displacement of intra-ocular contents. Sinuses: Moderate left maxillary hemosinus. Soft tissues: Mild left periorbital soft tissue swelling Limited intracranial: See separately dictated head CT IMPRESSION: Acute minimally displaced left orbital floor fracture with moderate  left maxillary hemosinus Electronically Signed   By: Donavan Foil M.D.   On: 07/13/2022 19:42    Impression: Hematemesis  HGB 10.7(11.2) Platelets 215 AST 26 ALT 34  Alkphos 152 TBili 1.4 INR 2.9  Presenting with 1 day of multiple episodes of hematemesis.  Slight decline in mental function today.  Hemoglobin stable at 10.7.  She has had episodes of hypotension and tachycardia throughout the day.  Possible hematemesis due to gastric ulcer, gastritis, esophagitis.  No history of GI bleed.  She has increased risk of GI bleed due to warfarin use.  BUN elevated at 41.  Concern for upper GI bleed.   Case discussed and patient seen with Dr. Watt Climes.  Plan: Plan for EGD tomorrow. I thoroughly discussed the procedures to include nature, alternatives, benefits, and risks including but not limited to bleeding, perforation, infection, anesthesia/cardiac and pulmonary complications. Patient provides understanding and gave verbal consent to proceed. Recheck PT/INR in the AM, if INR <2 will need FFP transfusion prior to the EGD.  Continue Protonix IV '40mg'$  BID Continue clear liquid diet NPO at midnight Continue anti-emetics and supportive care as needed. [facility] will follow.     LOS: 2 days   Charlott Rakes  PA-C 07/16/2022, 4:24 PM  Contact #  [phone]

## 2022-07-16 NOTE — Progress Notes (Signed)
Secure chat from RN stating patient with fluctuating blood pressures and worsening mental status. Rapid response called regarding this. Patient is laying in bed appears pale and uncomfortable. She is disoriented and unable to answer any question. Only occasionally mumbling or moaning. Son at beside reports she has been more awake and responsive several hours ago but since then has seems sleepy and confused.   Blood pressure (!) 81/67, pulse (!) 119, temperature 97.6 F (36.4 C), temperature source Oral, resp. rate 18, height '5\' 7"'$  (1.702 m), weight 65 kg, SpO2 100 %.  Constitutional: ill appearing, moaning in bed Cardiovascular: Tachycardic, irregular, No JVD, cool extremities  Respiratory: kussmaul breathing, no crackles or wheezing, on 3L [place] GI: non distended, normal bs Neurological: aox0, eyes open, not able to answer questions or follow commands, lethargic Skin: cool, diminished turgor  Patient with worsening hypotension over the course of the day. SBPs have been variable in the last hour between 60-80s. Worsening mental status throughout the day as well. No further hematemesis since this morning. Has received about 1.5L fluids today. Last had 244m bolus around 1700. Suspect hypovolemic shock given earlier hematemesis and poor PO intake. Also with acute renal failure with rising Cr. To 3 and oliguria. BP have not improved with careful hydration during the day. Will order an additional 1L IV bolus. No further bleeding or drop in hgb will hold off on transfusion for now. Ordered transfer to progressive unit earlier to day. Nursing concerned about level of care and worsening hypotension. PCCM consulted and will evaluate..  Son also present at bedside. Discussed she is at high risk for poor outcome. Son expressed he would like to give his mother "every chance to bounce back" . We discussed her chronic medical issues, underlying dementia, and worsening clinical status. Son realizes that there may be a  chance she may be close to end of life. He is considering what the patient's goals of care might be but would like to discuss with PCCM prior to making decisions. For now he would like to continue with full scope of care and Full code. He intends to remain at patient's beside tonight and wife will also be joining him.

## 2022-07-16 NOTE — Progress Notes (Addendum)
Per MD request, attempted  to reach lab tech at ext. [phone] regarding pending lab draws. No answer at this time.

## 2022-07-17 ENCOUNTER — Inpatient Hospital Stay (HOSPITAL_COMMUNITY): Payer: Medicare Other

## 2022-07-17 DIAGNOSIS — S72002A Fracture of unspecified part of neck of left femur, initial encounter for closed fracture: Secondary | ICD-10-CM | POA: Diagnosis not present

## 2022-07-17 DIAGNOSIS — K7290 Hepatic failure, unspecified without coma: Secondary | ICD-10-CM | POA: Diagnosis not present

## 2022-07-17 DIAGNOSIS — R410 Disorientation, unspecified: Secondary | ICD-10-CM | POA: Diagnosis not present

## 2022-07-17 DIAGNOSIS — E872 Acidosis, unspecified: Secondary | ICD-10-CM | POA: Diagnosis not present

## 2022-07-17 DIAGNOSIS — I50810 Right heart failure, unspecified: Secondary | ICD-10-CM | POA: Diagnosis present

## 2022-07-17 DIAGNOSIS — I5020 Unspecified systolic (congestive) heart failure: Secondary | ICD-10-CM

## 2022-07-17 LAB — CBC
HCT: 31.7 % — ABNORMAL LOW (ref 36.0–46.0)
Hemoglobin: 9.7 g/dL — ABNORMAL LOW (ref 12.0–15.0)
MCH: 35.4 pg — ABNORMAL HIGH (ref 26.0–34.0)
MCHC: 30.6 g/dL (ref 30.0–36.0)
MCV: 115.7 fL — ABNORMAL HIGH (ref 80.0–100.0)
Platelets: 206 10*3/uL (ref 150–400)
RBC: 2.74 MIL/uL — ABNORMAL LOW (ref 3.87–5.11)
RDW: 17 % — ABNORMAL HIGH (ref 11.5–15.5)
WBC: 23.6 10*3/uL — ABNORMAL HIGH (ref 4.0–10.5)
nRBC: 1.4 % — ABNORMAL HIGH (ref 0.0–0.2)

## 2022-07-17 LAB — HEPATIC FUNCTION PANEL
ALT: 319 U/L — ABNORMAL HIGH (ref 0–44)
AST: 634 U/L — ABNORMAL HIGH (ref 15–41)
Albumin: 2.9 g/dL — ABNORMAL LOW (ref 3.5–5.0)
Alkaline Phosphatase: 112 U/L (ref 38–126)
Bilirubin, Direct: 1.2 mg/dL — ABNORMAL HIGH (ref 0.0–0.2)
Indirect Bilirubin: 0.8 mg/dL (ref 0.3–0.9)
Total Bilirubin: 2 mg/dL — ABNORMAL HIGH (ref 0.3–1.2)
Total Protein: 5.7 g/dL — ABNORMAL LOW (ref 6.5–8.1)

## 2022-07-17 LAB — BASIC METABOLIC PANEL
BUN: 46 mg/dL — ABNORMAL HIGH (ref 8–23)
CO2: <7 mmol/L — ABNORMAL LOW (ref 22–32)
Calcium: >15 mg/dL (ref 8.9–10.3)
Chloride: 98 mmol/L (ref 98–111)
Creatinine, Ser: 3.12 mg/dL — ABNORMAL HIGH (ref 0.44–1.00)
GFR, Estimated: 14 mL/min — ABNORMAL LOW (ref >60)
Glucose, Bld: 107 mg/dL — ABNORMAL HIGH (ref 70–99)
Potassium: 5.7 mmol/L — ABNORMAL HIGH (ref 3.5–5.1)
Sodium: 138 mmol/L (ref 135–145)

## 2022-07-17 LAB — POCT I-STAT 7, (LYTES, BLD GAS, ICA,H+H)
Acid-base deficit: 24 mmol/L — ABNORMAL HIGH (ref 0.0–2.0)
Bicarbonate: 5.6 mmol/L — ABNORMAL LOW (ref 20.0–28.0)
Calcium, Ion: 1.08 mmol/L — ABNORMAL LOW (ref 1.15–1.40)
HCT: 31 % — ABNORMAL LOW (ref 36.0–46.0)
Hemoglobin: 10.5 g/dL — ABNORMAL LOW (ref 12.0–15.0)
O2 Saturation: 100 %
Patient temperature: 98.7
Potassium: 5.7 mmol/L — ABNORMAL HIGH (ref 3.5–5.1)
Sodium: 130 mmol/L — ABNORMAL LOW (ref 135–145)
TCO2: 6 mmol/L — ABNORMAL LOW (ref 22–32)
pCO2 arterial: 21.3 mmHg — ABNORMAL LOW (ref 32–48)
pH, Arterial: 7.026 — CL (ref 7.35–7.45)
pO2, Arterial: 345 mmHg — ABNORMAL HIGH (ref 83–108)

## 2022-07-17 LAB — BLOOD GAS, ARTERIAL
Acid-base deficit: 24.1 mmol/L — ABNORMAL HIGH (ref 0.0–2.0)
Bicarbonate: 3.6 mmol/L — ABNORMAL LOW (ref 20.0–28.0)
Drawn by: 51155
O2 Saturation: 100 %
Patient temperature: 35
pCO2 arterial: <18 mmHg — CL (ref 32–48)
pH, Arterial: 7.12 — CL (ref 7.35–7.45)
pO2, Arterial: 156 mmHg — ABNORMAL HIGH (ref 83–108)

## 2022-07-17 LAB — GLUCOSE, CAPILLARY
Glucose-Capillary: 110 mg/dL — ABNORMAL HIGH (ref 70–99)
Glucose-Capillary: 109 mg/dL — ABNORMAL HIGH (ref 70–99)
Glucose-Capillary: 104 mg/dL — ABNORMAL HIGH (ref 70–99)

## 2022-07-17 LAB — VITAMIN D 25 HYDROXY (VIT D DEFICIENCY, FRACTURES): Vit D, 25-Hydroxy: 18.69 ng/mL — ABNORMAL LOW (ref 30–100)

## 2022-07-17 LAB — LACTIC ACID, PLASMA: Lactic Acid, Venous: >9 mmol/L (ref 0.5–1.9)

## 2022-07-17 SURGERY — EGD (ESOPHAGOGASTRODUODENOSCOPY)
Anesthesia: Monitor Anesthesia Care

## 2022-07-17 MED ORDER — FENTANYL CITRATE PF 50 MCG/ML IJ SOSY
PREFILLED_SYRINGE | INTRAMUSCULAR | Status: AC
  Administered 2022-07-17: 50 ug
  Filled 2022-07-17: qty 2

## 2022-07-17 MED ORDER — ORAL CARE MOUTH RINSE
15.0000 mL | OROMUCOSAL | Status: DC
  Administered 2022-07-17 (×3): 15 mL via OROMUCOSAL

## 2022-07-17 MED ORDER — SODIUM BICARBONATE 8.4 % IV SOLN
50.0000 meq | Freq: Once | INTRAVENOUS | Status: AC
  Administered 2022-07-17: 50 meq via INTRAVENOUS
  Filled 2022-07-17: qty 50

## 2022-07-17 MED ORDER — GLYCOPYRROLATE 0.2 MG/ML IJ SOLN: 0.2000 mg | INTRAMUSCULAR | Status: DC | PRN

## 2022-07-17 MED ORDER — MIDAZOLAM HCL 2 MG/2ML IJ SOLN
INTRAMUSCULAR | Status: AC
  Filled 2022-07-17: qty 2

## 2022-07-17 MED ORDER — DEXMEDETOMIDINE HCL IN NACL 400 MCG/100ML IV SOLN
0.4000 ug/kg/h | INTRAVENOUS | Status: DC
  Administered 2022-07-17: 0.4 ug/kg/h via INTRAVENOUS
  Filled 2022-07-17: qty 100

## 2022-07-17 MED ORDER — PHENYLEPHRINE HCL-NACL 20-0.9 MG/250ML-% IV SOLN
0.0000 ug/min | INTRAVENOUS | Status: DC
  Administered 2022-07-17: 20 ug/min via INTRAVENOUS
  Administered 2022-07-17: 50 ug/min via INTRAVENOUS
  Filled 2022-07-17 (×2): qty 250

## 2022-07-17 MED ORDER — ACETAMINOPHEN 650 MG RE SUPP: 650.0000 mg | Freq: Four times a day (QID) | RECTAL | Status: DC | PRN

## 2022-07-17 MED ORDER — GLYCOPYRROLATE 1 MG PO TABS: 1.0000 mg | ORAL_TABLET | ORAL | Status: DC | PRN

## 2022-07-17 MED ORDER — POLYVINYL ALCOHOL 1.4 % OP SOLN: 1.0000 [drp] | Freq: Four times a day (QID) | OPHTHALMIC | Status: DC | PRN

## 2022-07-17 MED ORDER — LORAZEPAM 2 MG/ML IJ SOLN: 2.0000 mg | INTRAMUSCULAR | Status: DC | PRN

## 2022-07-17 MED ORDER — FENTANYL CITRATE PF 50 MCG/ML IJ SOSY: 50.0000 ug | PREFILLED_SYRINGE | Freq: Once | INTRAMUSCULAR | Status: AC

## 2022-07-17 MED ORDER — ROCURONIUM BROMIDE 10 MG/ML (PF) SYRINGE
PREFILLED_SYRINGE | INTRAVENOUS | Status: AC
  Filled 2022-07-17: qty 10

## 2022-07-17 MED ORDER — SODIUM CHLORIDE 0.9 % IV SOLN: INTRAVENOUS | Status: DC

## 2022-07-17 MED ORDER — SODIUM BICARBONATE 8.4 % IV SOLN
INTRAVENOUS | Status: DC
  Filled 2022-07-17 (×2): qty 1000

## 2022-07-17 MED ORDER — HALOPERIDOL LACTATE 5 MG/ML IJ SOLN: 2.5000 mg | INTRAMUSCULAR | Status: DC | PRN

## 2022-07-17 MED ORDER — ETOMIDATE 2 MG/ML IV SOLN
INTRAVENOUS | Status: AC
  Filled 2022-07-17: qty 20

## 2022-07-17 MED ORDER — MORPHINE 100MG IN NS 100ML (1MG/ML) PREMIX INFUSION
0.0000 mg/h | INTRAVENOUS | Status: DC
  Administered 2022-07-17: 5 mg/h via INTRAVENOUS
  Filled 2022-07-17: qty 100

## 2022-07-17 MED ORDER — MORPHINE BOLUS VIA INFUSION: 5.0000 mg | INTRAVENOUS | Status: DC | PRN

## 2022-07-17 MED ORDER — ORAL CARE MOUTH RINSE: 15.0000 mL | OROMUCOSAL | Status: DC | PRN

## 2022-07-17 MED ORDER — ACETAMINOPHEN 325 MG PO TABS: 650.0000 mg | ORAL_TABLET | Freq: Four times a day (QID) | ORAL | Status: DC | PRN

## 2022-07-17 MED ORDER — ETOMIDATE 2 MG/ML IV SOLN
INTRAVENOUS | Status: AC
  Filled 2022-07-17: qty 10

## 2022-07-18 LAB — TYPE AND SCREEN
ABO/RH(D): O POS
Antibody Screen: NEGATIVE
Unit division: 0

## 2022-07-18 LAB — BPAM RBC
Blood Product Expiration Date: 202310042359
Unit Type and Rh: 5100

## 2022-07-26 DIAGNOSIS — R791 Abnormal coagulation profile: Secondary | ICD-10-CM

## 2022-07-26 DIAGNOSIS — J9601 Acute respiratory failure with hypoxia: Secondary | ICD-10-CM

## 2022-07-26 DIAGNOSIS — I469 Cardiac arrest, cause unspecified: Secondary | ICD-10-CM

## 2022-07-26 DIAGNOSIS — E872 Acidosis, unspecified: Secondary | ICD-10-CM

## 2022-07-26 DIAGNOSIS — Z7189 Other specified counseling: Secondary | ICD-10-CM

## 2022-07-26 DIAGNOSIS — K920 Hematemesis: Secondary | ICD-10-CM

## 2022-08-03 NOTE — Progress Notes (Signed)
OT Cancellation Note  Patient Details Name: Kylena Mole MRN: [id number] DOB: 1934-10-26   Cancelled Treatment:    Reason Eval/Treat Not Completed: Medical issues which prohibited therapy;Patient not medically ready: Pt with change in status and no longer appropriate for therapy services. Spoke with RN who asks for OT to sign off.   Julien Girt 08-03-2022, 10:16 AM

## 2022-08-03 NOTE — Progress Notes (Signed)
PT Cancellation Note  Patient Details Name: Natasha Livingston MRN: [id number] DOB: Jun 03, 1934   Cancelled Treatment:    Reason Eval/Treat Not Completed: Medical issues which prohibited therapy.  Pt with change in status and no longer appropriate for therapy services. RN asks for PT to sign off.  Jul 21, 2022  Natasha Carne., PT Acute Rehabilitation Services [phone]  (pager) [phone]  (office)     Natasha Livingston 21-Jul-2022, 10:19 AM

## 2022-08-03 NOTE — Progress Notes (Signed)
Coded with refractory shock and acidemia. ROSC achieved after 1 round CPR and 1 dose epi. Subsequently intubated. HCPOA paper work arrived confirming son at bedside is Economist. After discussion, code status changed to DNR which he agrees to.

## 2022-08-03 NOTE — Progress Notes (Signed)
   Aug 02, 2022 0500  Clinical Encounter Type  Visited With Patient;Health care provider (ATTENDING PHYSICIAN: Dr. Bonna Gains. Hunsucker, MD; NURSE: [name], RN)  Visit Type Patient actively dying;Initial  Referral From Nurse [name], RN)  Consult/Referral To Chaplain [name])  Recommendations PAGED: "EOL"  Spiritual Encounters  Spiritual Needs Emotional   Chaplain paged to meet with family of [name] as family makes end of life decisions. Met patient's with son, Mr. Debany Vantol, in family waiting room. Mr. Osoria briefly shared family history - He and his brother Marya Amsler are the only living next of kin. Gerald Stabs shared that his mother has named himself as Media planner, but it is not currently on file, nor does he have a copy with him. He contacted his wife and she is enroute to hospital with document. Ms. Bertucci is currently "Full Code."   We discussed what his mother's wishes for ongoing care would be. He stated that she did not want to be placed on ventilator. But at the present time he wants to keep his mother as "Full-Code" until he has opportunity to review H.C.P.O.A. Gerald Stabs stated that his brother Marya Amsler and his wife had recently been at patient's bedside and shared private time with their mother. Gerald Stabs has communicated with his brother - explained their mother's current condition. Gerald Stabs believes that his is enroute to [employer]  Chaplain discussed family's current plan for Ms. Gruel's continued care with [name], as it was communicated to me by Gerald Stabs.  [name] was informed that H.C.P.O.A. is not currently on file, but that Gerald Stabs' wife is enroute with it. Chaplain invited Mr. Redmon to stay with his mother for duration of time. We shared in prayer's for comfort, peace, and family unity and anointing of the ill performed. Chaplain will remain available for family and staff as needs arise. [address] [place], [place]., [phone]

## 2022-08-03 NOTE — Procedures (Signed)
Extubation Procedure Note  Patient Details:   Name: Natasha Livingston DOB: [birth year] MRN: [id number]   Airway Documentation:    Vent end date: 08-15-2022 Vent end time: 1415   Evaluation  O2 sats: stable throughout Complications: No apparent complications Patient did tolerate procedure well. Bilateral Breath Sounds: Clear, Diminished   Yes Pt was compassionately extubated with no apparent complications.   Felecia Jan 08/15/2022, 2:26 PM

## 2022-08-03 NOTE — Progress Notes (Signed)
 NAME:  Natasha Livingston, MRN:  [id number], DOB:  08/26/1934, LOS: 3 ADMISSION DATE:  07/27/2022, CONSULTATION DATE:  9/13 REFERRING MD:  Dr. Duncan Vincent, CHIEF COMPLAINT:  Hypotension   History of Present Illness:  88 y.o. woman with CKD 3A, ischemic cardiomyopathy status post CABG with most recent EF 40% 2019 presents after fall status post left femur fracture status post repair whom we are consulted for hypotension, hypoxemia, renal failure, A-fib with RVR.  Pertinent  Medical History   Atrial fibrillation (HCC), Back pain, Bruises easily, Chest discomfort, Diverticulitis, Dizziness, DOE (dyspnea on exertion), Epistaxis (06/16/2019), Forgetfulness, cardiovascular stress test, Hyperlipidemia, Hypertension, Hypothyroidism, Interstitial nephritis, Ischemic heart disease, MVP (mitral valve prolapse), and Neuropathy.    Significant Hospital Events: Including procedures, antibiotic start and stop dates in addition to other pertinent events   9/11 fall at home, admitted for left femur fracture 9/12 surgical repair 9/13 poor mental status, hypotension, transferred to ICU. 9/14 Asystole and CPR performed. Intubated. IO placed RLE 9/14 Goals of Care. Plan for comort care and withdraw of care later in the day  Interim History / Subjective:  Patient requiring full support and has been refractory to interventions. Goals of care conversation had with family who agree with comfort care measures and withdrawing care once rest of family has time to see patient.  Objective   Blood pressure (!) 49/26, pulse (!) 114, temperature 98.7 F (37.1 C), temperature source Axillary, resp. rate (!) 28, height 5' 7" (1.702 m), weight 64.4 kg, SpO2 100 %.    Vent Mode: PRVC FiO2 (%):  [100 %] 100 % Set Rate:  [30 bmp-35 bmp] 35 bmp Vt Set:  [490 mL] 490 mL PEEP:  [5 cmH20] 5 cmH20 Plateau Pressure:  [15 cmH20] 15 cmH20   Intake/Output Summary (Last 24 hours) at 07/23/2022 0949 Last data filed at 07/14/2022  0800 Gross per 24 hour  Intake 857.2 ml  Output 250 ml  Net 607.2 ml   Filed Weights   07/16/22 0100 07/16/22 2300 07/13/2022 0113  Weight: 65 kg 64.4 kg 64.4 kg   Examination: General: Ill and frail appearing HENT: Periorbital ecchymosis of left eye.  Lungs: CTA, ventilator sounds Cardiovascular: tachycardic, irregular Abdomen: soft, non-tender Extremities: Right IO in place. Extremities warm and dry.  Neuro: Sedated GU: Foley catheter in place  Assessment & Plan:   Goals of Care Dr. Ellison and myself met with family and decision was made to transition to comfort care. Please see interdisciplinary goals of care meeting note from 9/14 at 1008am -Discontinue further lab testing -Once family has time with patient plan to extubate and withdrawal care, initiate comfort care measures at this time.   Acute encephalopathy 2/2 cardiogenic shock & sedatives Remains encephalopathic and not following commands, she is not requiring any sedating medications while intubated. Possible anoxic brain injury s/p arrest  Acute hypoxic respiratoyr failure secondary to cardiac arrest  Max ventilator support, plan to extubate later this afternoon  AKI on CKD 3a secondary to cardiogenic shock and hypervolemia Anion gap metabolic acidosis Hypercalcemia Hyperkalemia Worsening renal function and anion gap acidosis. Also with worsening electrolyte derangements which may have led to arrest. Will not pursue further lab data with plan for comfort care.   HFrEF  Ischemic Cardiomyopathy History of right sided heart failure, possible biventricular component. Comfort care measures as per above  Elevated INR S/p vitamin K two times Continued to have elevated INR. Will not give additional vitamin K with comfort care measures  Emesis   GI was consulted and plan was for endoscopy today but plan now for comfort care measures  Left intertrochanteric hip fracture p/o day 2 With comfort care measures in place  will make certain pain is well controlled.    Best Practice (right click and "Reselect all SmartList Selections" daily)   Diet/type: NPO DVT prophylaxis: None GI prophylaxis: PPI IV Lines: IO, peripheral lines. Will remove once comfort care measures initiated Foley:  Remove Code Status:  DNR Last date of multidisciplinary goals of care discussion [9/14] Plan for comfort care measures. Will remove all lines and drains at that time.   Labs   CBC: Recent Labs  Lab 07/20/2022 0058 07/16/22 0937 07/16/22 1352 07/16/22 1849 07/18/2022 0211 07/24/2022 0844  WBC 12.2* 17.0* 16.3* 19.6* 23.6*  --   HGB 11.5* 11.2* 10.7* 11.1* 9.7* 10.5*  HCT 33.0* 34.5* 33.8* 34.9* 31.7* 31.0*  MCV 100.9* 106.8* 110.5* 111.9* 115.7*  --   PLT 202 201 215 206 206  --     Basic Metabolic Panel: Recent Labs  Lab 08/01/2022 2158 07/30/2022 0058 07/16/22 0937 07/16/22 1352 07/16/22 1849 07/12/2022 0211 08/01/2022 0844  NA  --  134* 135 135 137 138 130*  K  --  4.6 5.7* 5.7* 5.9* 5.7* 5.7*  CL  --  99 100 99 100 98  --   CO2  --  25 15* 11* 9* <7*  --   GLUCOSE  --  212* 164* 110* 61* 107*  --   BUN  --  22 41* 43* 43* 46*  --   CREATININE  --  1.17* 2.49* 3.17* 2.91* 3.12*  --   CALCIUM  --  9.5 9.7 9.7 10.0 >15.0*  --   MG 1.9  --   --   --   --   --   --    GFR: Estimated Creatinine Clearance: 12.6 mL/min (A) (by C-G formula based on SCr of 3.12 mg/dL (H)). Recent Labs  Lab 07/07/2022 1041 07/09/2022 0058 07/16/22 0937 07/16/22 1352 07/16/22 1849 07/16/22 2209 07/22/2022 0211  WBC 9.3   < > 17.0* 16.3* 19.6*  --  23.6*  LATICACIDVEN 1.9  --   --   --   --  >9.0* >9.0*   < > = values in this interval not displayed.    Liver Function Tests: Recent Labs  Lab 07/18/2022 1041 07/20/2022 0211  AST 26 634*  ALT 34 319*  ALKPHOS 152* 112  BILITOT 1.4* 2.0*  PROT 6.7 5.7*  ALBUMIN 3.4* 2.9*   No results for input(s): "LIPASE", "AMYLASE" in the last 168 hours. Recent Labs  Lab 07/27/2022 2158   AMMONIA 14    ABG    Component Value Date/Time   PHART 7.026 (LL) 07/06/2022 0844   PCO2ART 21.3 (L) 07/21/2022 0844   PO2ART 345 (H) 07/11/2022 0844   HCO3 5.6 (L) 07/31/2022 0844   TCO2 6 (L) 07/16/2022 0844   ACIDBASEDEF 24.0 (H) 07/16/2022 0844   O2SAT 100 07/20/2022 0844     Coagulation Profile: Recent Labs  Lab 07/14/22 1041 08/01/2022 0058 07/05/2022 0850 07/16/22 0937 07/16/22 1849  INR 2.1* 1.9* 1.8* 2.9* 3.6*    Cardiac Enzymes: Pending  HbA1C: Hgb A1c MFr Bld  Date/Time Value Ref Range Status  02/24/2021 07:17 AM 7.5 (H) 4.8 - 5.6 % Final    Comment:    (NOTE) Pre diabetes:          5.7%-6.4%  Diabetes:              >  6.4%  Glycemic control for   <7.0% adults with diabetes   04/08/2007 03:56 PM (H)  Final   6.9 (NOTE)   The ADA recommends the following therapeutic goals for glycemic   control related to Hgb A1C measurement:   Goal of Therapy:   < 7.0% Hgb A1C   Action Suggested:  > 8.0% Hgb A1C   Ref:  Diabetes Care, 22, Suppl. 1, 1999    CBG: Recent Labs  Lab 07/16/22 2242 07/16/22 2300 07/16/22 2342 2022/08/06 0334 Aug 06, 2022 0803  GLUCAP 19* 111* 99 110* 109*    Review of Systems:   As per history  Past Medical History:  She,  has a past medical history of Atrial fibrillation ([place]), Back pain, Bruises easily, Chest discomfort, Diverticulitis, Dizziness, DOE (dyspnea on exertion), Epistaxis (06/16/2019), Forgetfulness, cardiovascular stress test, Hyperlipidemia, Hypertension, Hypothyroidism, Interstitial nephritis, Ischemic heart disease, MVP (mitral valve prolapse), and Neuropathy.    [place]  Internal Medicine Resident PGY-3 [place]  Pager: [phone]

## 2022-08-03 NOTE — Progress Notes (Signed)
   07-31-22 0653  Clinical Encounter Type  Visited With Patient not available;Health care provider;Family  Visit Type Code;Initial  Referral From Physician;Nurse (Dr. Bonna Gains. Hansucker, MD; [name], RN)  Consult/Referral To Chaplain [name])  Recommendations CODE BLUE  Spiritual Encounters  Spiritual Needs Emotional;Grief support   Chaplain was with son and Medical Team at time of witnessed cardiac arrest. Son requested full support and agreed to patient being placed on ventilator. Medical team performing Advanced Life Support care. Chaplain met patient's daughter-in-law at [facility] and acquired H.C.P.O.A. and provided it to [name], RN/Medical Chart/Scanned into ACP. [name] recommended to [name] (H.C.P.O.A.) that patient be transitioned to DNR. [name] agreed with physician recommendation. Family was gathered in consultation room and providing comfort to one another. [address] [place], [place] [phone]

## 2022-08-03 NOTE — Progress Notes (Signed)
eLink Physician-Brief Progress Note Patient Name: Natasha Livingston DOB: 1933-11-10 MRN: [id number]   Date of Service  2022-08-14  HPI/Events of Note  Pt is more agitated, swinging at staff and family as per RN.    eICU Interventions  Start on precedex gtt.     Intervention Category Intermediate Interventions: Other:  [name] 2022/08/14, 12:35 AM

## 2022-08-03 NOTE — Progress Notes (Signed)
OT Cancellation Note  Patient Details Name: Patrisha Hausmann MRN: [id number] DOB: 02-18-1934   Cancelled Treatment:     Hold OT Evaluation in AM post PT Evaluation and PT report that pt had orthostatic hypotension at EOB and required rapid return to supine.  OT returned in PM with Mds assessing pt and requested that therapy hold for rest of day.  Julien Girt 07/28/2022, 8:20 AM

## 2022-08-03 NOTE — Progress Notes (Signed)
Events of last night reviewed and no further bleeding was reported and based on multiple factors it was decided to make her comfort care and please let us know if we could be of any further assistance with this patient

## 2022-08-03 NOTE — Progress Notes (Signed)
eLink Physician-Brief Progress Note Patient Name: Natasha Livingston DOB: Feb 09, 1934 MRN: [id number]   Date of Service  07/21/22  HPI/Events of Note  ABG 7.12, pCO2 <18, pCO2 156.   eICU Interventions  Give NaHCO3 88mq IV push and start on HCO3 gtt at 50cc/hr.     Intervention Category Intermediate Interventions: Diagnostic test evaluation  VElsie Lincoln9Sep 18, 2023 1:02 AM

## 2022-08-03 NOTE — Progress Notes (Signed)
Time of death confirmed with this RN and Leanord Hawking RN at [phone].  Family at bedside.  Belongings taken with family.

## 2022-08-03 NOTE — Significant Event (Signed)
  Interdisciplinary Goals of Care Family Meeting   Date carried out: 08/03/2022  Location of the meeting: Unit  Member's involved: Dr. Loanne Drilling attending, Dr. Johnney Ou. Patient's son, Natasha Livingston his wife and their pastor.   Durable Power of Tour manager: Patient's son Natasha Livingston    Discussion: We discussed goals of care for Natasha Livingston . We discussed her requiring maximum vasopressor requirements and that she continues to in multi-system organ failure despite interventions. Family agrees with comfort care measures and to withdrawing care once rest of family has time to come and see patient.   Code status: Full DNR  Disposition: In-patient comfort care, plant withdrawal care once family has time to see patient.   Time spent for the meeting: 5 minutes   [name], MD  2022/08/03, 10:08 AM

## 2022-08-03 NOTE — Procedures (Signed)
Intubation Procedure Note  Abimbola Aki  [id number]  [birth year]  Date:Jul 22, 2022  Time:7:25 AM   Provider Performing:Anacleto Batterman R Leeanna Slaby    Procedure: Intubation (86578)  Indication(s) Respiratory Failure  Consent Unable to obtain consent due to emergent nature of procedure.   Anesthesia Fentanyl   Time Out Verified patient identification, verified procedure, site/side was marked, verified correct patient position, special equipment/implants available, medications/allergies/relevant history reviewed, required imaging and test results available.   Sterile Technique Usual hand hygeine, masks, and gloves were used   Procedure Description Patient positioned in bed supine.  Sedation given as noted above.  Patient was intubated with endotracheal tube using Glidescope.  View was Grade 1 full glottis .  Number of attempts was 1.  Colorimetric CO2 detector was consistent with tracheal placement.   Complications/Tolerance None; patient tolerated the procedure well. Chest X-ray is ordered to verify placement.   EBL none   Specimen(s) None

## 2022-08-03 NOTE — Progress Notes (Signed)
PCCM Progress Note  Family has arrived. Ready to transition to comfort care. Orders placed with plan for compassionate extubation.

## 2022-08-03 NOTE — Progress Notes (Signed)
eLink Physician-Brief Progress Note Patient Name: Natasha Livingston DOB: 1934/07/10 MRN: [id number]   Date of Service  07-22-22  HPI/Events of Note  Notified of hypotension with BP 52/73.   Discussions are on-going with regards to goals of care.   BP on recheck was 91/64.  eICU Interventions  Start neosynephrine peripherally in addition to levophed gtt.  Continue HCO3 gtt.      Intervention Category Major Interventions: Hypotension - evaluation and management  [name] July 22, 2022, 5:21 AM

## 2022-08-03 NOTE — Progress Notes (Signed)
eLink Physician-Brief Progress Note Patient Name: Natasha Livingston DOB: 06-10-1934 MRN: [id number]   Date of Service  Aug 09, 2022  HPI/Events of Note  Notified of code.   Pt went into asystole at 6:53.  CPR initiated.  Pt intubated successfully. IO placed for pressors.  eICU Interventions  Ventilator orders placed.   Follow up CXR and ABG post-intubation.   Code status remains full code.     Intervention Category Major Interventions: Other:  [name] 09-Aug-2022, 7:00 AM

## 2022-08-03 NOTE — Progress Notes (Signed)
On admission patient was lethargic and unable to answer staff questions or follow commands. Blood glucose was 19, one amp of D50 given. Rectal temp 94.9, patient was placed on bear hugger. Blood pressure of 77/57, patient was started on a norepi drip. After glucose given patient was more alert but agitated swinging at family members and staff, precedex drip started. Critical labs of lactic greater than 9, troponin 329, ABG Ph 7.12 pCO2 less than 18 reported to Elink. EKG was completed and 1 amp of bicarb was given, and will start bicarb drip when it arrives from main pharmacy.

## 2022-08-03 NOTE — Progress Notes (Signed)
Patient's BP started to drop, this RN went to bedside to increase vasopressors. MD also at bedside and Patient lost pulse at 0653, code blue called, staff at bedside CPR started. See code sheet. ROSC was achieved at 0658. Family at bedside and code status changed to DNR.

## 2022-08-03 NOTE — Death Summary Note (Signed)
DEATH SUMMARY   Patient Details  Name: Natasha Livingston MRN: [id number] DOB: 05-29-1934  Admission/Discharge Information   Admit Date:  07-31-2022  Date of Death: Date of Death: 08-03-2022  Time of Death: Time of Death: 69  Length of Stay: 3  Referring Physician: Cyndi Bender, PA-C   Reason(s) for Hospitalization  Left hip fracture  Diagnoses  Preliminary cause of death: Multiorgan failure secondary renal failure Secondary Diagnoses (including complications and co-morbidities): Cardiac arrest Principal Problem:   Hip fracture ([place]) Active Problems:   Atrial fibrillation ([place])   Hypertension   Aortic stenosis   Ischemic heart disease   Cardiogenic shock ([place])   Acute on chronic renal failure (HCC)   Hyperkalemia   Liver failure (HCC)   Lactic acidosis   Right ventricular failure (HCC)   Delirium   Heart failure with 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 year old female with a hx of valvular afib on warfarin, mitral valve prolapse, ischemic heart disease s/p CABG, HTN, CKD IIIA who presented with complaints of hip pain after fall. Found with left femur fracture s/p repair. PCCM consulted for hypotension, hypoxemia, acute on chronic renal failure, A-fib with RVR. On arrival to ICU exam and clinical presentation consistent with multiorgan failure and developing RV failure. She has asystole arrest and required CPR and intubation. Goals of care was discussed with family and decision made to withdraw care after family arrived. After compassionate extubation she expired at 1435 on 03-Aug-2022.  Pertinent Labs and Studies  Significant  Diagnostic Studies DG Chest Port 1 View  Result Date: 07/16/2022 CLINICAL DATA:  Shortness of breath. EXAM: PORTABLE CHEST 1 VIEW COMPARISON:  Chest x-ray 07/31/22 FINDINGS: The heart is enlarged. Patient is status post cardiac surgery. There is no focal lung infiltrate, pleural effusion or pneumothorax. No acute fractures are seen. IMPRESSION: 1. No acute cardiopulmonary process. 2. Stable cardiomegaly. Electronically Signed   By: Ronney Asters M.D.   On: 07/16/2022 23:56   DG FEMUR PORT MIN 2 VIEWS LEFT  Result Date: 07/22/2022 CLINICAL DATA:  IM nail of the left femur EXAM: LEFT FEMUR PORTABLE 2 VIEWS COMPARISON:  07/23/2022 intraoperative spot images. FINDINGS: Left proximal femur IM nail with interlocking femoral head screw in place and traversing the known left proximal femur intertrochanteric fracture. Near anatomic alignment. Single distal incomplete interlocking screw. Expected gas in the adjacent soft tissues. No new fracture or complicating feature observed. Osteoarthritis of the left knee noted. Probable meniscal chondrocalcinosis. Superficial femoral artery atherosclerosis. Contrast medium in the urinary bladder. Lower lumbar spondylosis and degenerative disc disease. IMPRESSION: 1. Left proximal femoral IM nail with interlocking femoral head screw, no complicating feature. 2. Osteoarthritis left knee probably with meniscal chondrocalcinosis. 3. Atherosclerosis. Electronically Signed   By: Van Clines M.D.   On: 07/16/2022 16:46   DG HIP UNILAT WITH PELVIS 1V LEFT  Result Date: 07/31/2022 CLINICAL DATA:  Intramedullary nailing of the LEFT femur. EXAM: DG HIP (WITH OR WITHOUT PELVIS) 1V*L*; DG C-ARM 1-60 MIN-NO REPORT COMPARISON:  Chest abdomen and pelvis CT from July 31, 2022 FINDINGS: Intraoperative fluoroscopic images a total of 9 images are provided. These images display changes of  intramedullary rod placement and dynamic hip screw placement for fixation of the comminuted  intratrochanteric fracture seen on the recent CT evaluation. No immediate intra procedural findings which are unexpected. Fluoroscopic dose: 16.17 mGy IMPRESSION: Intraoperative images of ORIF of a comminuted LEFT femoral fracture of the intratrochanteric LEFT femur. Electronically Signed   By: Zetta Bills M.D.   On: 07/05/2022 14:20   DG C-Arm 1-60 Min-No Report  Result Date: 07/27/2022 Fluoroscopy was utilized by the requesting physician.  No radiographic interpretation.   DG C-Arm 1-60 Min-No Report  Result Date: 07/18/2022 Fluoroscopy was utilized by the requesting physician.  No radiographic interpretation.   CT MAXILLOFACIAL WO CONTRAST  Result Date: 07/23/2022 CLINICAL DATA:  Fall with bruising under the right eye EXAM: CT MAXILLOFACIAL WITHOUT CONTRAST TECHNIQUE: Multidetector CT imaging of the maxillofacial structures was performed. Multiplanar CT image reconstructions were also generated. RADIATION DOSE REDUCTION: This exam was performed according to the departmental dose-optimization program which includes automated exposure control, adjustment of the mA and/or kV according to patient size and/or use of iterative reconstruction technique. COMPARISON:  Head CT 07/18/2022 FINDINGS: Osseous: Mastoid air cells are clear. Mandibular heads are normally position. No mandibular fracture. Pterygoid plates and zygomatic arches are intact. No acute nasal bone fracture Orbits: Acute minimally displaced left orbital floor fracture. No displacement of intra-ocular contents. Sinuses: Moderate left maxillary hemosinus. Soft tissues: Mild left periorbital soft tissue swelling Limited intracranial: See separately dictated head CT IMPRESSION: Acute minimally displaced left orbital floor fracture with moderate left maxillary hemosinus Electronically Signed   By: Donavan Foil M.D.   On: 07/13/2022 19:42   DG Knee Complete 4 Views Left  Result Date: 07/18/2022 CLINICAL DATA:  Fall with lateral knee pain and  LEFT hip pain. EXAM: LEFT KNEE - COMPLETE 4+ VIEW COMPARISON:  Femoral evaluation of the same date. FINDINGS: Mild tricompartmental degenerative changes about the knee. No sign of joint effusion. Moderate chondrocalcinosis. No acute fracture or dislocation about the LEFT the. Soft tissues are unremarkable. IMPRESSION: 1. No acute fracture or dislocation. Mild tricompartmental degenerative changes. 2. Moderate chondrocalcinosis. Electronically Signed   By: Zetta Bills M.D.   On: 07/08/2022 14:58   CT CHEST ABDOMEN PELVIS W CONTRAST  Result Date: 07/06/2022 CLINICAL DATA:  An 88 year old female presents following mechanical fall for assessment of trauma. EXAM: CT CHEST, ABDOMEN, AND PELVIS WITH CONTRAST TECHNIQUE: Multidetector CT imaging of the chest, abdomen and pelvis was performed following the standard protocol during bolus administration of intravenous contrast. RADIATION DOSE REDUCTION: This exam was performed according to the departmental dose-optimization program which includes automated exposure control, adjustment of the mA and/or kV according to patient size and/or use of iterative reconstruction technique. CONTRAST:  139m OMNIPAQUE IOHEXOL 300 MG/ML  SOLN COMPARISON:  April of 2022 CT of the chest. FINDINGS: CT CHEST FINDINGS Cardiovascular: Marked cardiomegaly with signs of median sternotomy for CABG. LEFT atrial and RIGHT heart enlargement. No pericardial effusion. Central pulmonary vasculature is of normal caliber and is unremarkable on venous phase. Aortic atherosclerosis both calcified and noncalcified of the thoracic aorta. No signs of dilation or aortic injury. Three-vessel branching pattern in the chest. Mediastinum/Nodes: No hematoma in the mediastinum. No adenopathy in the chest. Lungs/Pleura: Basilar atelectasis, stable mild subpleural reticulation at the lung bases. Stable benign pulmonary nodule in the RIGHT lung base (image 101/5) 3 mm. Airways are patent. No pneumothorax. No pleural  effusion. Musculoskeletal: See below for full musculoskeletal details. CT ABDOMEN PELVIS FINDINGS Hepatobiliary: Reflux of contrast into the  hepatic veins. Lobular hepatic contours. No focal, suspicious hepatic lesion or signs of hepatic trauma. Post cholecystectomy without biliary duct distension. Portal vein is patent. Pancreas: Pancreatic atrophy without signs of inflammation. No ductal dilation. No signs of trauma to the pancreas. Spleen: Smooth contours without signs of trauma or focal, suspicious lesion. Normal size. Adrenals/Urinary Tract: Adrenal glands are normal. Symmetric renal enhancement without signs of hydronephrosis or perinephric stranding. Signs of renal trauma. No signs of renal trauma. Urinary bladder with smooth contours. No suspicious renal lesion or Stomach/Bowel: Colonic diverticulosis in sigmoid diverticular disease. No acute gastrointestinal process. Appendix not visible but no secondary signs to suggest acute appendiceal process. Vascular/Lymphatic: Smooth aortic contour with normal caliber and extensive calcified and noncalcified aortic atherosclerotic plaque. No signs of adenopathy in the abdomen or in the pelvis. Reproductive: Post hysterectomy. Other: No sign of free air.  No evidence of pelvic ascites. Musculoskeletal: Visualized clavicles and scapulae are intact. No displaced rib fractures. Sternum with median sternotomy changes. Costochondral elements are intact. No body wall contusion aside from contusion overlying the LEFT gluteal region and hip. Spinal degenerative changes without signs of spinal malalignment. Comminuted fracture of the intertrochanteric LEFT proximal femur. Femoral head is located within the acetabulum. Intramuscular hematoma of gluteal musculature on the LEFT is small. Bilateral SI joints are symmetric.  Symphysis pubis is intact. IMPRESSION: 1. Comminuted fracture of the intertrochanteric LEFT proximal femur. Associated with marked comminution and Verus  angulation. 2. Intramuscular hematoma of the LEFT gluteal musculature is small. 3. Marked cardiomegaly with signs of median sternotomy for CABG. 4. Lobular hepatic contours, correlate with any clinical or laboratory evidence of liver disease. Patient is at risk for cardiogenic liver disease based on the appearance of the heart and signs of RIGHT heart dysfunction described above with reflux of contrast into hepatic veins. 5. Colonic diverticulosis without evidence of acute gastrointestinal process. 6. Aortic atherosclerosis. Aortic Atherosclerosis (ICD10-I70.0). Electronically Signed   By: Zetta Bills M.D.   On: 07/07/2022 13:22   CT HEAD WO CONTRAST  Result Date: 07/13/2022 CLINICAL DATA:  Mechanical fall EXAM: CT HEAD WITHOUT CONTRAST TECHNIQUE: Contiguous axial images were obtained from the base of the skull through the vertex without intravenous contrast. RADIATION DOSE REDUCTION: This exam was performed according to the departmental dose-optimization program which includes automated exposure control, adjustment of the mA and/or kV according to patient size and/or use of iterative reconstruction technique. COMPARISON:  None Available. FINDINGS: Brain: No evidence of acute infarction, hemorrhage, hydrocephalus, extra-axial collection or mass lesion/mass effect. Vascular: No hyperdense vessel or unexpected calcification. Skull: No acute fracture or focal lesion. Of note, there is motion artifact at the skull base, which slightly limits evaluation. Sinuses/Orbits: Partial opacification of the left maxillary sinus. Other: None. IMPRESSION: 1. No definite acute intracranial abnormality. 2. Partial opacification of the left maxillary sinus, which may be related to chronic sinusitis. However, a subtle nondisplaced sinus fracture and resulting blood products is not excluded. If there is persistent clinical concern, consider further assessment with CT facial bone. Electronically Signed   By: Beryle Flock M.D.    On: 07/25/2022 13:09   CT CERVICAL SPINE WO CONTRAST  Result Date: 07/26/2022 CLINICAL DATA:  Polytrauma, blunt. EXAM: CT CERVICAL SPINE WITHOUT CONTRAST TECHNIQUE: Multidetector CT imaging of the cervical spine was performed without intravenous contrast. Multiplanar CT image reconstructions were also generated. RADIATION DOSE REDUCTION: This exam was performed according to the departmental dose-optimization program which includes automated exposure control, adjustment of the mA and/or kV  according to patient size and/or use of iterative reconstruction technique. COMPARISON:  None Available. FINDINGS: Alignment: Straightening/mild reversal of the normal cervical lordosis. Mild right convex curvature of the cervical spine. Minimal anterolisthesis of C3 on C4, C4 on C5, and C7 on T1, likely degenerative. Skull base and vertebrae: No acute fracture or suspicious osseous lesion. Moderate C1-2 arthropathy with noncompressive ligamentous thickening and calcification posterior to the dens. Soft tissues and spinal canal: No prevertebral fluid or swelling. No visible canal hematoma. Disc levels: Advanced disc degeneration at C5-6 and C6-7. Widespread cervical and upper thoracic facet arthrosis. Multilevel neural foraminal stenosis due to uncovertebral and facet spurring, moderate on the right at C6-7. No evidence of high-grade spinal canal stenosis. Upper chest: More fully evaluated on separate chest CT. Other: Carotid atherosclerosis. IMPRESSION: 1. No acute cervical spine fracture. 2. Advanced multilevel disc and facet degeneration. Electronically Signed   By: Logan Bores M.D.   On: 07/24/2022 13:08   DG Pelvis Portable  Result Date: 07/09/2022 CLINICAL DATA:  Mechanical fall from standing EXAM: PORTABLE PELVIS 1 VIEW COMPARISON:  None Available. FINDINGS: Mild diffuse osteopenia. There is an apparent cortical discontinuity at the superior aspect of the left lesser trochanter, which may represent a nondisplaced  fracture. However, there is limited assessment on single AP view and this may represent superimposed osseous structures. There is no evidence of pelvic diastasis. No pelvic bone lesions are seen. Moderate multilevel degenerative changes of the visualized lower lumbar spine. Moderate bilateral hip osteoarthritis. IMPRESSION: Questioned nondisplaced fracture of the left lesser trochanter. However, there is limited assessment on the provided single AP view. If there is persistent clinical concern, recommend multi-view left hip radiograph for further assessment. Electronically Signed   By: Beryle Flock M.D.   On: 07/27/2022 11:21   DG FEMUR PORT 1V LEFT  Result Date: 08/02/2022 CLINICAL DATA:  Fall EXAM: LEFT FEMUR PORTABLE 1 VIEW COMPARISON:  None Available. FINDINGS: No radiographic abnormality of the distal femur seen in single frontal view only. IMPRESSION: No radiographic abnormality of the distal femur seen in single frontal view only. Electronically Signed   By: Delanna Ahmadi M.D.   On: 07/28/2022 11:15   DG Chest Port 1 View  Result Date: 07/26/2022 CLINICAL DATA:  w 88 year old female post fall. EXAM: PORTABLE CHEST 1 VIEW COMPARISON:  Mar 18, 2021 FINDINGS: Post median sternotomy for CABG. EKG leads project over the chest. Cardiomediastinal contours and hilar structures are stable following median sternotomy with signs of cardiac enlargement as before. Lungs are clear.  No pneumothorax.  No sign of pleural effusion. On limited assessment no acute skeletal process IMPRESSION: 1. Post median sternotomy with signs of cardiac enlargement. 2. No acute cardiopulmonary findings. Electronically Signed   By: Zetta Bills M.D.   On: 07/18/2022 11:15    Microbiology Recent Results (from the past 240 hour(s))  Resp Panel by RT-PCR (Flu A&B, Covid) Anterior Nasal Swab     Status: None   Collection Time: 07/12/2022 10:41 AM   Specimen: Anterior Nasal Swab  Result Value Ref Range Status   SARS Coronavirus  2 by RT PCR NEGATIVE NEGATIVE Final    Comment: (NOTE) SARS-CoV-2 target nucleic acids are NOT DETECTED.  The SARS-CoV-2 RNA is generally detectable in upper respiratory specimens during the acute phase of infection. The lowest concentration of SARS-CoV-2 viral copies this assay can detect is 138 copies/mL. A negative result does not preclude SARS-Cov-2 infection and should not be used as the sole basis for treatment or  other patient management decisions. A negative result may occur with  improper specimen collection/handling, submission of specimen other than nasopharyngeal swab, presence of viral mutation(s) within the areas targeted by this assay, and inadequate number of viral copies(<138 copies/mL). A negative result must be combined with clinical observations, patient history, and epidemiological information. The expected result is Negative.  Fact Sheet for Patients:  EntrepreneurPulse.com.au  Fact Sheet for Healthcare Providers:  IncredibleEmployment.be  This test is no t yet approved or cleared by the [place] FDA and  has been authorized for detection and/or diagnosis of SARS-CoV-2 by FDA under an Emergency Use Authorization (EUA). This EUA will remain  in effect (meaning this test can be used) for the duration of the COVID-19 declaration under Section 564(b)(1) of the Act, 21 U.S.C.section 360bbb-3(b)(1), unless the authorization is terminated  or revoked sooner.       Influenza A by PCR NEGATIVE NEGATIVE Final   Influenza B by PCR NEGATIVE NEGATIVE Final    Comment: (NOTE) The Xpert Xpress SARS-CoV-2/FLU/RSV plus assay is intended as an aid in the diagnosis of influenza from Nasopharyngeal swab specimens and should not be used as a sole basis for treatment. Nasal washings and aspirates are unacceptable for Xpert Xpress SARS-CoV-2/FLU/RSV testing.  Fact Sheet for Patients: EntrepreneurPulse.com.au  Fact  Sheet for Healthcare Providers: IncredibleEmployment.be  This test is not yet approved or cleared by the [place] FDA and has been authorized for detection and/or diagnosis of SARS-CoV-2 by FDA under an Emergency Use Authorization (EUA). This EUA will remain in effect (meaning this test can be used) for the duration of the COVID-19 declaration under Section 564(b)(1) of the Act, 21 U.S.C. section 360bbb-3(b)(1), unless the authorization is terminated or revoked.  Performed at [facility], [place] [address], [place], [place] [address]   Surgical pcr screen     Status: None   Collection Time: 07/06/2022  9:00 PM   Specimen: Nasal Mucosa; Nasal Swab  Result Value Ref Range Status   MRSA, PCR NEGATIVE NEGATIVE Final   Staphylococcus aureus NEGATIVE NEGATIVE Final    Comment: (NOTE) The Xpert SA Assay (FDA approved for NASAL specimens in patients 103 years of age and older), is one component of a comprehensive surveillance program. It is not intended to diagnose infection nor to guide or monitor treatment. Performed at [facility], [place] [address], [place], [place] [address]     Lab Basic Metabolic Panel: Recent Labs  Lab 07/13/2022 2158 07/26/2022 0058 07/16/22 0937 07/16/22 1352 07/16/22 1849 07/24/22 0211 Jul 24, 2022 0844  NA  --  134* 135 135 137 138 130*  K  --  4.6 5.7* 5.7* 5.9* 5.7* 5.7*  CL  --  99 100 99 100 98  --   CO2  --  25 15* 11* 9* <7*  --   GLUCOSE  --  212* 164* 110* 61* 107*  --   BUN  --  22 41* 43* 43* 46*  --   CREATININE  --  1.17* 2.49* 3.17* 2.91* 3.12*  --   CALCIUM  --  9.5 9.7 9.7 10.0 >15.0*  --   MG 1.9  --   --   --   --   --   --    Liver Function Tests: Recent Labs  Lab 07/19/2022 1041 07/24/22 0211  AST 26 634*  ALT 34 319*  ALKPHOS 152* 112  BILITOT 1.4* 2.0*  PROT 6.7 5.7*  ALBUMIN 3.4* 2.9*   No results  for input(s): "LIPASE", "AMYLASE" in the last 168 hours. Recent Labs  Lab 07/24/2022 2158   AMMONIA 14   CBC: Recent Labs  Lab 07/29/2022 0058 07/16/22 0937 07/16/22 1352 07/16/22 1849 2022-08-16 0211 16-Aug-2022 0844  WBC 12.2* 17.0* 16.3* 19.6* 23.6*  --   HGB 11.5* 11.2* 10.7* 11.1* 9.7* 10.5*  HCT 33.0* 34.5* 33.8* 34.9* 31.7* 31.0*  MCV 100.9* 106.8* 110.5* 111.9* 115.7*  --   PLT 202 201 215 206 206  --    Cardiac Enzymes: No results for input(s): "CKTOTAL", "CKMB", "CKMBINDEX", "TROPONINI" in the last 168 hours. Sepsis Labs: Recent Labs  Lab 08/01/2022 1041 07/18/2022 0058 07/16/22 0937 07/16/22 1352 07/16/22 1849 07/16/22 2209 August 16, 2022 0211  WBC 9.3   < > 17.0* 16.3* 19.6*  --  23.6*  LATICACIDVEN 1.9  --   --   --   --  >9.0* >9.0*   < > = values in this interval not displayed.    Procedures/Operations     Sulaiman Imbert Rodman [place]le 08/16/2022, 2:50 PM

## 2022-08-03 NOTE — IPAL (Signed)
  Interdisciplinary Goals of Care Family Meeting   Date carried out: 2022-08-14  Location of the meeting: Bedside  Member's involved: Physician, Bedside Registered Nurse, and Family Member or next of kin  Durable Power of Attorney or acting medical decision maker: next of kin, son, Gayna Braddy    Discussion: We discussed goals of care for Natasha Livingston .  We discussed her comorbid pre-existing conditions including CKD, ischemic cardiomyopathy EF 40%, right ventricular dysfunction/pulmonary hypertension that is chronic on serial echocardiograms over the last several years.  She had a fall and fractured her hip.  Unfortunately, in the postoperative period she developed renal failure and worsening hypotension.  Bedside ultrasound shows plethoric IVC and RV dysfunction.  Worsening hypotension despite vasopressors.  Oliguric despite high-dose IV Lasix 120 mg IV.  Now developing liver failure with rising INR despite holding Coumadin and given vitamin K as well as inability to clear lactate. LFTs rising.  She is in multisystem organ failure.  She is developing florid RV failure in the setting of volume overload.  Counseled the son on multiple occasions that these events are not survivable.  Especially in light of now liver failure on top of preceding issues that led to ICU transfer.  She has refractory acidosis and developing refractory hypotension and cardiogenic shock.  It is my strong recommendation that we do not escalate care.  We do not do any invasive procedures, lines etc.  We change her CODE STATUS to DNR.  If she miraculously improves that we continue what were doing and try to make her better.  I also counseled him that in light of multisystem organ failure and clinical deterioration over the last few hours that pursuing comfort measures only would be very reasonable.  I counseled him that any escalation of care is likely futile and I do not recommend this.  Despite multiple attempts at  arriving at a decision no real decision was made.  Even when offering to help make a decision or make decision for him as he was struggling making decisions, this was rebuffed.  Not in confrontational way.  But just pure indecisiveness.  Wife is on the way with HCPOA paperwork.  Reported there is another estranged son.  They reportedly communicated son and he said his goodbyes via the phone.  Chaplain was called to visit with family as well.  Per RN, no further decisions to be made until [facility] paperwork is brought in by wife of son.  Awaiting wife's return.  Code status: Full Code  Disposition: Continue current acute care  Time spent for the meeting: 1 hour and 30 minutes    Lanier Clam, MD  14-Aug-2022, 4:12 AM

## 2022-08-03 NOTE — Progress Notes (Signed)
Patient continues to have intermittently low BP's Elink notified and orders given to add neo and increase bicarb drip. Son at bedside requested [facility] services after speaking with MD regarding goals of care. Chaplin now at bedside. Son has asked wife to bring healthcare power of attorney paperwork before making any further decisions.

## 2022-08-03 DEATH — deceased

## 2022-08-28 ENCOUNTER — Ambulatory Visit: Payer: Medicare Other | Admitting: Cardiology

## 2022-09-11 MED FILL — Medication: Qty: 1 | Status: AC
